# Patient Record
Sex: Female | Born: 1955 | Race: White | Hispanic: No | Smoking: Never smoker
Health system: Southern US, Community
[De-identification: ages and names within clinical notes are randomized; demographics above are authoritative.]

## PROBLEM LIST (undated history)

## (undated) DIAGNOSIS — E785 Hyperlipidemia, unspecified: Secondary | ICD-10-CM

## (undated) DIAGNOSIS — I10 Essential (primary) hypertension: Secondary | ICD-10-CM

## (undated) DIAGNOSIS — K829 Disease of gallbladder, unspecified: Secondary | ICD-10-CM

## (undated) DIAGNOSIS — F32A Depression, unspecified: Secondary | ICD-10-CM

## (undated) DIAGNOSIS — M549 Dorsalgia, unspecified: Secondary | ICD-10-CM

## (undated) DIAGNOSIS — G43909 Migraine, unspecified, not intractable, without status migrainosus: Secondary | ICD-10-CM

## (undated) DIAGNOSIS — F329 Major depressive disorder, single episode, unspecified: Secondary | ICD-10-CM

## (undated) DIAGNOSIS — M4800 Spinal stenosis, site unspecified: Secondary | ICD-10-CM

## (undated) DIAGNOSIS — C50919 Malignant neoplasm of unspecified site of unspecified female breast: Secondary | ICD-10-CM

## (undated) DIAGNOSIS — E039 Hypothyroidism, unspecified: Secondary | ICD-10-CM

## (undated) DIAGNOSIS — R112 Nausea with vomiting, unspecified: Secondary | ICD-10-CM

## (undated) DIAGNOSIS — F909 Attention-deficit hyperactivity disorder, unspecified type: Secondary | ICD-10-CM

## (undated) DIAGNOSIS — G629 Polyneuropathy, unspecified: Secondary | ICD-10-CM

## (undated) DIAGNOSIS — Z923 Personal history of irradiation: Secondary | ICD-10-CM

## (undated) DIAGNOSIS — K8020 Calculus of gallbladder without cholecystitis without obstruction: Secondary | ICD-10-CM

## (undated) DIAGNOSIS — E079 Disorder of thyroid, unspecified: Secondary | ICD-10-CM

## (undated) DIAGNOSIS — Z9889 Other specified postprocedural states: Secondary | ICD-10-CM

## (undated) DIAGNOSIS — M2550 Pain in unspecified joint: Secondary | ICD-10-CM

## (undated) DIAGNOSIS — F419 Anxiety disorder, unspecified: Secondary | ICD-10-CM

## (undated) DIAGNOSIS — Z9221 Personal history of antineoplastic chemotherapy: Secondary | ICD-10-CM

## (undated) HISTORY — DX: Major depressive disorder, single episode, unspecified: F32.9

## (undated) HISTORY — PX: DENTAL SURGERY: SHX609

## (undated) HISTORY — PX: REPLACEMENT TOTAL KNEE: SUR1224

## (undated) HISTORY — DX: Disorder of thyroid, unspecified: E07.9

## (undated) HISTORY — DX: Polyneuropathy, unspecified: G62.9

## (undated) HISTORY — DX: Disease of gallbladder, unspecified: K82.9

## (undated) HISTORY — PX: CYSTIC HYGROMA EXCISION: SHX450

## (undated) HISTORY — DX: Pain in unspecified joint: M25.50

## (undated) HISTORY — DX: Depression, unspecified: F32.A

## (undated) HISTORY — DX: Dorsalgia, unspecified: M54.9

## (undated) HISTORY — DX: Essential (primary) hypertension: I10

## (undated) HISTORY — DX: Hyperlipidemia, unspecified: E78.5

## (undated) HISTORY — DX: Attention-deficit hyperactivity disorder, unspecified type: F90.9

## (undated) HISTORY — PX: COLONOSCOPY: SHX174

## (undated) HISTORY — DX: Spinal stenosis, site unspecified: M48.00

## (undated) HISTORY — DX: Calculus of gallbladder without cholecystitis without obstruction: K80.20

---
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
## 1998-10-12 ENCOUNTER — Ambulatory Visit (HOSPITAL_COMMUNITY): Admission: RE | Admit: 1998-10-12 | Discharge: 1998-10-12 | Payer: Self-pay | Admitting: Obstetrics & Gynecology

## 1998-10-16 ENCOUNTER — Other Ambulatory Visit: Admission: RE | Admit: 1998-10-16 | Discharge: 1998-10-16 | Payer: Self-pay | Admitting: Obstetrics & Gynecology

## 1998-10-16 ENCOUNTER — Encounter: Admission: RE | Admit: 1998-10-16 | Discharge: 1999-01-14 | Payer: Self-pay | Admitting: Obstetrics & Gynecology

## 1999-06-29 ENCOUNTER — Ambulatory Visit (HOSPITAL_COMMUNITY): Admission: RE | Admit: 1999-06-29 | Discharge: 1999-06-29 | Payer: Self-pay | Admitting: Obstetrics & Gynecology

## 1999-06-29 ENCOUNTER — Encounter: Payer: Self-pay | Admitting: Obstetrics & Gynecology

## 1999-11-07 ENCOUNTER — Other Ambulatory Visit: Admission: RE | Admit: 1999-11-07 | Discharge: 1999-11-07 | Payer: Self-pay | Admitting: Obstetrics & Gynecology

## 2001-01-22 ENCOUNTER — Encounter: Admission: RE | Admit: 2001-01-22 | Discharge: 2001-01-22 | Payer: Self-pay | Admitting: Obstetrics & Gynecology

## 2001-01-22 ENCOUNTER — Other Ambulatory Visit: Admission: RE | Admit: 2001-01-22 | Discharge: 2001-01-22 | Payer: Self-pay | Admitting: Obstetrics & Gynecology

## 2001-01-22 ENCOUNTER — Encounter: Payer: Self-pay | Admitting: Obstetrics & Gynecology

## 2001-10-07 HISTORY — PX: ABDOMINAL HYSTERECTOMY: SHX81

## 2002-01-25 ENCOUNTER — Other Ambulatory Visit: Admission: RE | Admit: 2002-01-25 | Discharge: 2002-01-25 | Payer: Self-pay | Admitting: Obstetrics and Gynecology

## 2002-01-25 ENCOUNTER — Encounter: Payer: Self-pay | Admitting: Obstetrics & Gynecology

## 2002-01-25 ENCOUNTER — Ambulatory Visit (HOSPITAL_COMMUNITY): Admission: RE | Admit: 2002-01-25 | Discharge: 2002-01-25 | Payer: Self-pay | Admitting: Obstetrics & Gynecology

## 2002-04-13 ENCOUNTER — Encounter (INDEPENDENT_AMBULATORY_CARE_PROVIDER_SITE_OTHER): Payer: Self-pay

## 2002-04-14 ENCOUNTER — Inpatient Hospital Stay (HOSPITAL_COMMUNITY): Admission: RE | Admit: 2002-04-14 | Discharge: 2002-04-15 | Payer: Self-pay | Admitting: Obstetrics & Gynecology

## 2003-08-31 ENCOUNTER — Encounter: Admission: RE | Admit: 2003-08-31 | Discharge: 2003-08-31 | Payer: Self-pay | Admitting: Obstetrics & Gynecology

## 2004-01-18 ENCOUNTER — Other Ambulatory Visit: Admission: RE | Admit: 2004-01-18 | Discharge: 2004-01-18 | Payer: Self-pay | Admitting: Obstetrics & Gynecology

## 2004-08-10 ENCOUNTER — Other Ambulatory Visit: Admission: RE | Admit: 2004-08-10 | Discharge: 2004-08-10 | Payer: Self-pay | Admitting: Family Medicine

## 2004-09-23 LAB — HM COLONOSCOPY: HM Colonoscopy: NEGATIVE

## 2005-02-04 HISTORY — PX: CATARACT EXTRACTION: SUR2

## 2005-03-05 ENCOUNTER — Other Ambulatory Visit: Admission: RE | Admit: 2005-03-05 | Discharge: 2005-03-05 | Payer: Self-pay | Admitting: Obstetrics & Gynecology

## 2006-02-25 ENCOUNTER — Encounter: Admission: RE | Admit: 2006-02-25 | Discharge: 2006-02-25 | Payer: Self-pay | Admitting: Obstetrics & Gynecology

## 2008-10-18 ENCOUNTER — Encounter: Payer: Self-pay | Admitting: Family Medicine

## 2009-04-03 ENCOUNTER — Ambulatory Visit: Payer: Self-pay | Admitting: Family Medicine

## 2009-04-03 DIAGNOSIS — F339 Major depressive disorder, recurrent, unspecified: Secondary | ICD-10-CM | POA: Insufficient documentation

## 2009-04-03 DIAGNOSIS — I10 Essential (primary) hypertension: Secondary | ICD-10-CM | POA: Insufficient documentation

## 2009-04-03 DIAGNOSIS — E039 Hypothyroidism, unspecified: Secondary | ICD-10-CM | POA: Insufficient documentation

## 2009-04-03 LAB — CONVERTED CEMR LAB
Glucose, Urine, Semiquant: NEGATIVE
Nitrite: NEGATIVE
Specific Gravity, Urine: 1.025
WBC Urine, dipstick: NEGATIVE

## 2009-04-04 LAB — CONVERTED CEMR LAB
ALT: 16 units/L (ref 0–35)
BUN: 19 mg/dL (ref 6–23)
Basophils Relative: 0 % (ref 0.0–3.0)
Bilirubin, Direct: 0.1 mg/dL (ref 0.0–0.3)
CO2: 31 meq/L (ref 19–32)
Chloride: 106 meq/L (ref 96–112)
Cholesterol: 244 mg/dL — ABNORMAL HIGH (ref 0–200)
Creatinine, Ser: 0.8 mg/dL (ref 0.4–1.2)
Direct LDL: 170.5 mg/dL
Eosinophils Absolute: 0.1 10*3/uL (ref 0.0–0.7)
Eosinophils Relative: 2.1 % (ref 0.0–5.0)
HCT: 39.8 % (ref 36.0–46.0)
Lymphs Abs: 1.4 10*3/uL (ref 0.7–4.0)
MCHC: 34.5 g/dL (ref 30.0–36.0)
MCV: 87.9 fL (ref 78.0–100.0)
Monocytes Absolute: 0.3 10*3/uL (ref 0.1–1.0)
Neutrophils Relative %: 56.6 % (ref 43.0–77.0)
Platelets: 207 10*3/uL (ref 150.0–400.0)
Potassium: 3.8 meq/L (ref 3.5–5.1)
TSH: 3.64 microintl units/mL (ref 0.35–5.50)
Total Protein: 6.8 g/dL (ref 6.0–8.3)

## 2009-04-14 ENCOUNTER — Ambulatory Visit: Payer: Self-pay | Admitting: Family Medicine

## 2009-04-14 DIAGNOSIS — L301 Dyshidrosis [pompholyx]: Secondary | ICD-10-CM | POA: Insufficient documentation

## 2009-04-14 DIAGNOSIS — F988 Other specified behavioral and emotional disorders with onset usually occurring in childhood and adolescence: Secondary | ICD-10-CM | POA: Insufficient documentation

## 2009-05-23 ENCOUNTER — Telehealth: Payer: Self-pay | Admitting: Family Medicine

## 2009-05-26 ENCOUNTER — Telehealth: Payer: Self-pay | Admitting: Family Medicine

## 2009-07-14 ENCOUNTER — Telehealth: Payer: Self-pay | Admitting: Family Medicine

## 2009-07-18 ENCOUNTER — Telehealth: Payer: Self-pay | Admitting: Family Medicine

## 2009-07-24 ENCOUNTER — Telehealth: Payer: Self-pay | Admitting: Family Medicine

## 2009-08-22 ENCOUNTER — Telehealth: Payer: Self-pay | Admitting: Family Medicine

## 2009-09-25 ENCOUNTER — Telehealth: Payer: Self-pay | Admitting: Family Medicine

## 2009-10-16 ENCOUNTER — Ambulatory Visit: Payer: Self-pay | Admitting: Family Medicine

## 2009-10-16 DIAGNOSIS — J019 Acute sinusitis, unspecified: Secondary | ICD-10-CM | POA: Insufficient documentation

## 2009-10-31 ENCOUNTER — Telehealth: Payer: Self-pay | Admitting: *Deleted

## 2010-01-08 ENCOUNTER — Ambulatory Visit: Payer: Self-pay | Admitting: Family Medicine

## 2010-01-08 DIAGNOSIS — E785 Hyperlipidemia, unspecified: Secondary | ICD-10-CM

## 2010-01-08 DIAGNOSIS — E782 Mixed hyperlipidemia: Secondary | ICD-10-CM | POA: Insufficient documentation

## 2010-01-08 LAB — CONVERTED CEMR LAB: Cholesterol, target level: 200 mg/dL

## 2010-01-17 LAB — CONVERTED CEMR LAB
CO2: 30 meq/L (ref 19–32)
Chloride: 101 meq/L (ref 96–112)
Cholesterol: 234 mg/dL — ABNORMAL HIGH (ref 0–200)
Direct LDL: 159.9 mg/dL
Potassium: 4.1 meq/L (ref 3.5–5.1)
Sodium: 143 meq/L (ref 135–145)
TSH: 8.59 microintl units/mL — ABNORMAL HIGH (ref 0.35–5.50)
Total CHOL/HDL Ratio: 3
VLDL: 25.8 mg/dL (ref 0.0–40.0)

## 2010-04-12 ENCOUNTER — Telehealth: Payer: Self-pay | Admitting: Family Medicine

## 2010-04-12 ENCOUNTER — Ambulatory Visit: Payer: Self-pay | Admitting: Family Medicine

## 2010-04-12 DIAGNOSIS — R5381 Other malaise: Secondary | ICD-10-CM | POA: Insufficient documentation

## 2010-04-12 DIAGNOSIS — R5383 Other fatigue: Secondary | ICD-10-CM

## 2010-04-13 LAB — CONVERTED CEMR LAB: TSH: 4.23 microintl units/mL (ref 0.35–5.50)

## 2010-06-04 ENCOUNTER — Telehealth: Payer: Self-pay | Admitting: Family Medicine

## 2010-06-05 ENCOUNTER — Telehealth: Payer: Self-pay | Admitting: Family Medicine

## 2010-08-17 ENCOUNTER — Telehealth: Payer: Self-pay | Admitting: Family Medicine

## 2010-09-27 ENCOUNTER — Ambulatory Visit: Payer: Self-pay | Admitting: Family Medicine

## 2010-10-26 ENCOUNTER — Ambulatory Visit
Admission: RE | Admit: 2010-10-26 | Discharge: 2010-10-26 | Payer: Self-pay | Source: Home / Self Care | Attending: Family Medicine | Admitting: Family Medicine

## 2010-10-26 DIAGNOSIS — R635 Abnormal weight gain: Secondary | ICD-10-CM | POA: Insufficient documentation

## 2010-10-28 ENCOUNTER — Encounter: Payer: Self-pay | Admitting: Obstetrics & Gynecology

## 2010-10-29 ENCOUNTER — Encounter: Payer: Self-pay | Admitting: Obstetrics & Gynecology

## 2010-10-30 ENCOUNTER — Encounter
Admission: RE | Admit: 2010-10-30 | Discharge: 2010-10-30 | Payer: Self-pay | Source: Home / Self Care | Attending: Obstetrics & Gynecology | Admitting: Obstetrics & Gynecology

## 2010-11-08 NOTE — Assessment & Plan Note (Signed)
Summary: consult re: antidepressants/cjr   Vital Signs:  Patient profile:   55 year old female Weight:      196 pounds Temp:     98.2 degrees F oral BP sitting:   130 / 90  (left arm) Cuff size:   large  Vitals Entered By: Sid Falcon LPN (September 27, 2010 10:16 AM)  History of Present Illness: Long hx of depression.  Recent increased anhedonia.   Low motivation.  Less interest in hobbies.  Sleep OK. Appetite  OK.   Compliant with Effexor therapy. No suicidal ideation.  Has questions regarding additional meds vs change of medication.  Does not feel her depression is in remission.  Allergies: 1)  Morphine Sulfate (Morphine Sulfate)  Past History:  Past Medical History: Last updated: 04/03/2009 Depression Hypothyroidism ADD Hypertension PMH reviewed for relevance  Physical Exam  General:  Well-developed,well-nourished,in no acute distress; alert,appropriate and cooperative throughout examination Mouth:  Oral mucosa and oropharynx without lesions or exudates.  Teeth in good repair. Neck:  No deformities, masses, or tenderness noted. Lungs:  Normal respiratory effort, chest expands symmetrically. Lungs are clear to auscultation, no crackles or wheezes. Heart:  Normal rate and regular rhythm. S1 and S2 normal without gallop, murmur, click, rub or other extra sounds. Psych:  good eye contact, not anxious appearing, and depressed affect.     Impression & Recommendations:  Problem # 1:  DEPRESSION (ICD-311) Assessment Deteriorated Add Abilify 2 mg by mouth at bedtime.  Reassess in one month.  Consider change to Wellbutrin at follow up if still low energy, etc. Recent TSH normal. Her updated medication list for this problem includes:    Effexor Xr 75 Mg Xr24h-cap (Venlafaxine hcl) ..... One by mouth daily  Complete Medication List: 1)  Hydrochlorothiazide 25 Mg Tabs (Hydrochlorothiazide) .... Once daily 2)  Estradiol 1 Mg Tabs (Estradiol) .... Once daily 3)   Amphetamine-dextroamphetamine 30 Mg Xr24h-cap (Amphetamine-dextroamphetamine) .... One by mouth once daily 4)  Betamethasone Dipropionate 0.05 % Crea (Betamethasone dipropionate) .... Apply to affected rash two times a day no longer than 2 weeks of continuous use 5)  Sumatriptan Succinate 100 Mg Tabs (Sumatriptan succinate) .... Take one tab by mouth as needed, may repeat once in 24 hours 6)  Fluticasone Propionate 50 Mcg/act Susp (Fluticasone propionate) .... Use 2 sprays in each nostril daily 7)  Effexor Xr 75 Mg Xr24h-cap (Venlafaxine hcl) .... One by mouth daily 8)  Amphetamine-dextroamphetamine 30 Mg Xr24h-cap (Amphetamine-dextroamphetamine) .... One tab by mouth daily may fill in one month 9)  Amphetamine-dextroamphetamine 30 Mg Xr24h-cap (Amphetamine-dextroamphetamine) .... One tab daily may fill in two months 10)  Amoxicillin-pot Clavulanate 875-125 Mg Tabs (Amoxicillin-pot clavulanate) .... One by mouth two times a day for 10 days 11)  Levothroid 50 Mcg Tabs (Levothyroxine sodium) .... Take one tab by mouth once daily 12)  Abilify 2 Mg Tabs (Aripiprazole) .... One by mouth at bedtime  Patient Instructions: 1)  Please schedule a follow-up appointment in 1 month.  Prescriptions: ABILIFY 2 MG TABS (ARIPIPRAZOLE) one by mouth at bedtime  #30 x 1   Entered and Authorized by:   [name] MD   Signed by:   [name] MD on 09/27/2010   Method used:   Electronically to        [facility]  [phone]* (retail)       [address] [place], [place]  [address]       Ph: [phone] or [phone]  Fax: [phone]   RxID:   [id number]    Orders Added: 1)  Est. Patient Level III [87564]

## 2010-11-08 NOTE — Progress Notes (Signed)
Summary: Pt req refil of Adderall. Pls call when ready for pick up  Phone Note Refill Request Call back at Home Phone [phone] Message from:  Patient on August 17, 2010 1:37 PM  Refills Requested: Medication #1:  AMPHETAMINE-DEXTROAMPHETAMINE 30 MG XR24H-CAP one by mouth once daily   Dosage confirmed as above?Dosage Confirmed  Method Requested: Pick up at Office Initial call taken by: Lucy Antigua,  August 17, 2010 1:36 PM  Follow-up for Phone Call        Last 3 Rx filled 04/13/2010 Sid Falcon LPN  August 17, 2010 4:18 PM will refill Follow-up by: [name] MD,  August 20, 2010 8:23 AM    Prescriptions: AMPHETAMINE-DEXTROAMPHETAMINE 30 MG XR24H-CAP (AMPHETAMINE-DEXTROAMPHETAMINE) one tab daily may fill in two months  #30 x 0   Entered and Authorized by:   [name] MD   Signed by:   [name] MD on 08/20/2010   Method used:   Print then Give to Patient   RxID:   [id number] AMPHETAMINE-DEXTROAMPHETAMINE 30 MG XR24H-CAP (AMPHETAMINE-DEXTROAMPHETAMINE) one tab by mouth daily May fill in one month  #30 x 0   Entered and Authorized by:   [name] MD   Signed by:   [name] MD on 08/20/2010   Method used:   Print then Give to Patient   RxID:   [id number] AMPHETAMINE-DEXTROAMPHETAMINE 30 MG XR24H-CAP (AMPHETAMINE-DEXTROAMPHETAMINE) one by mouth once daily  #30 x 0   Entered and Authorized by:   [name] MD   Signed by:   [name] MD on 08/20/2010   Method used:   Print then Give to Patient   RxID:   [id number]

## 2010-11-08 NOTE — Assessment & Plan Note (Signed)
Summary: f/u on meds/cdw   Vital Signs:  Patient profile:   55 year old female Weight:      202 pounds Temp:     98.0 degrees F oral  Vitals Entered By: Sid Falcon LPN (October 26, 2010 11:31 AM) CC: discuss meds   History of Present Illness: Here to discuss the following:  Long hx of recurrent depression.  Has been on Effexor and recent addition of  Abilify.  Still feels depressed frequently and has low motivation and low energy. TSH normal last summer.  Compliant with meds. Would like to consider change of  antidepressant meds.  No suicidal ideation.  Some gradual mild weight gain.  No regular exercise.  Increased stress with work and  being single.  ADD stable on generic Adderall.  No recent headaches or BP problems. Hypothyroid and compliant with meds.  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  The patient denies anorexia, fever, hoarseness, chest pain, syncope, dyspnea on exertion, peripheral edema, prolonged cough, headaches, hemoptysis, abdominal pain, melena, hematochezia, severe indigestion/heartburn, and muscle weakness.    Physical Exam  General:  Well-developed,well-nourished,in no acute distress; alert,appropriate and cooperative  throughout examination Head:  Normocephalic and atraumatic without obvious abnormalities. No apparent alopecia or balding. Mouth:  Oral mucosa and oropharynx without lesions or exudates.  Teeth in good repair. Neck:  No deformities, masses, or tenderness noted. Lungs:  Normal respiratory effort, chest expands symmetrically. Lungs are clear to auscultation, no crackles or wheezes. Heart:  Normal rate and regular rhythm. S1 and S2 normal without gallop, murmur, click, rub or other extra sounds. Extremities:  No clubbing, cyanosis, edema, or deformity noted with normal full range of motion of all joints.     Impression & Recommendations:  Problem # 1:  DEPRESSION (ICD-311) discussed options. Considering several factors-low energy, low motivation, ADD hx, will switch to Wellburin after stopping Effexor.  Cont low dose Abilify for now.   Her updated medication list for this problem includes:    Wellbutrin Xl 300 Mg Xr24h-tab (Bupropion hcl) ..... One by mouth once daily  Problem # 2:  ADD (ICD-314.00)  Problem # 3:  HYPOTHYROIDISM (ICD-244.9)  Her updated medication list for this problem includes:    Levothroid 50 Mcg Tabs (Levothyroxine sodium) .Marland Kitchen... Take one tab by mouth once daily  Problem # 4:  WEIGHT GAIN (ICD-783.1) discussed weight loss strategies.  Complete Medication List: 1)  Hydrochlorothiazide 25 Mg Tabs (Hydrochlorothiazide) .... Once daily 2)  Estradiol 1 Mg Tabs (Estradiol) .... Once daily 3)  Amphetamine-dextroamphetamine 30 Mg Xr24h-cap (Amphetamine-dextroamphetamine) .... One by mouth once daily 4)  Sumatriptan Succinate 100 Mg Tabs (Sumatriptan succinate) .... Take one tab by mouth as needed, may repeat once in 24 hours 5)  Fluticasone Propionate 50 Mcg/act Susp (Fluticasone propionate) .... Use 2 sprays in each nostril daily 6)  Wellbutrin Xl 300 Mg Xr24h-tab (Bupropion hcl) .... One by mouth  once daily 7)  Amphetamine-dextroamphetamine 30 Mg Xr24h-cap  (Amphetamine-dextroamphetamine) .... One tab by mouth daily may fill in one month 8)  Amphetamine-dextroamphetamine 30 Mg Xr24h-cap (Amphetamine-dextroamphetamine) .... One tab daily may fill in two months 9)  Levothroid 50 Mcg Tabs (Levothyroxine sodium) .... Take one tab by mouth once daily 10)  Abilify 2 Mg Tabs (Aripiprazole) .... One by mouth at bedtime  Patient Instructions: 1)  Continue Abilify 2)  Reduce Effexor to every other day for one week then discontinue 3)  Start Wellbutrin after discontinuation of Effexor 4)  Please schedule a follow-up appointment in 1 month.  Prescriptions: WELLBUTRIN XL 300 MG XR24H-TAB (BUPROPION HCL) one by mouth once daily  #30 x 3   Entered and Authorized by:   [name] MD   Signed by:   [name] MD on 10/26/2010   Method used:   Print then Give to Patient   RxID:   [id number]    Orders Added: 1)  Est. Patient Level IV [14782]    Prevention & Chronic Care Immunizations   Influenza vaccine: Not documented    Tetanus booster: 04/14/2009: Tdap    Pneumococcal vaccine: Not documented  Colorectal Screening   Hemoccult: Not documented    Colonoscopy: normal  (12/06/2003)  Other Screening   Pap smear: Not documented    Mammogram: normal  (10/08/2007)   Smoking status: never  (10/26/2010)  Lipids   Total Cholesterol: 234  (01/08/2010)   LDL: Not documented   LDL Direct: 159.9  (01/08/2010)   HDL: 69.30  (01/08/2010)   Triglycerides: 129.0  (01/08/2010)    SGOT (AST): 18  (04/03/2009)   SGPT (ALT): 16  (04/03/2009)   Alkaline phosphatase: 52  (04/03/2009)   Total bilirubin: 0.6  (04/03/2009)  Hypertension   Last Blood Pressure: 130 / 90  (09/27/2010)   Serum creatinine: 0.8  (01/08/2010)   Serum potassium 4.1  (01/08/2010)  Self-Management Support :    Hypertension self-management support: Not documented    Lipid self-management support: Not documented

## 2010-11-08 NOTE — Progress Notes (Signed)
Summary: Thyroid written Rx for [facility]  Phone Note Call from Patient Call back at Home Phone [phone]   Caller: Patient Call For: [name] MD Summary of Call: Pt requesting a mail order Rx for [facility], she wants to pick up and mail in. Initial call taken by: Sid Falcon LPN,  June 04, 2010 4:14 PM    Prescriptions: LEVOTHROID 25 MCG TABS (LEVOTHYROXINE SODIUM) once daily  #90 x 3   Entered by:   Sid Falcon LPN   Authorized by:   [name] MD   Signed by:   Sid Falcon LPN on 09/81/1914   Method used:   Print then Give to Patient   RxID:   [id number]

## 2010-11-08 NOTE — Progress Notes (Signed)
Summary: rx change  Phone Note Call from Patient   Summary of Call: last rx was incorrect.  synthroid 50 micrograms. Initial call taken by: Kern Reap CMA (AAMA),  June 05, 2010 1:40 PM    New/Updated Medications: LEVOTHROID 50 MCG TABS (LEVOTHYROXINE SODIUM) take one tab by mouth once daily Prescriptions: LEVOTHROID 50 MCG TABS (LEVOTHYROXINE SODIUM) take one tab by mouth once daily  #90 x 3   Entered by:   Kern Reap CMA (AAMA)   Authorized by:   [name] MD   Signed by:   Kern Reap CMA (AAMA) on 06/05/2010   Method used:   Print then Give to Patient   RxID:   [id number]

## 2010-11-08 NOTE — Progress Notes (Signed)
Summary: new rx Adderall  Phone Note Call from Patient Call back at Home Phone [phone]   Caller: Patient Call For: Tiffany Peat MD Summary of Call: pt just left office she forget new rx generic adderall 30 mg Initial call taken by: Heron Sabins,  April 12, 2010 9:35 AM  Follow-up for Phone Call        Dr Caryl Never will fill using todays OV note.  Pt informed on home VM RX will be ready tomorrow.  Last filled 01/08/2010 Follow-up by: Sid Falcon LPN,  April 13, 980 9:05 AM  Additional Follow-up for Phone Call Additional follow up Details #1::        will refill Additional Follow-up by: Tiffany Peat MD,  April 13, 2010 9:36 AM    Additional Follow-up for Phone Call Additional follow up Details #2::    Pt informed RX ready for pick-up on home VM Follow-up by: Sid Falcon LPN,  April 13, 1913 10:12 AM  Prescriptions: AMPHETAMINE-DEXTROAMPHETAMINE 30 MG XR24H-CAP (AMPHETAMINE-DEXTROAMPHETAMINE) one tab daily may fill in two months  #30 x 0   Entered and Authorized by:   Tiffany Peat MD   Signed by:   Tiffany Peat MD on 04/13/2010   Method used:   Print then Give to Patient   RxID:   [id number] AMPHETAMINE-DEXTROAMPHETAMINE 30 MG XR24H-CAP (AMPHETAMINE-DEXTROAMPHETAMINE) one tab by mouth daily May fill in one month  #30 x 0   Entered and Authorized by:   Tiffany Peat MD   Signed by:   Tiffany Peat MD on 04/13/2010   Method used:   Print then Give to Patient   RxID:   [id number] AMPHETAMINE-DEXTROAMPHETAMINE 30 MG XR24H-CAP (AMPHETAMINE-DEXTROAMPHETAMINE) one by mouth once daily  #30 x 0   Entered and Authorized by:   Tiffany Peat MD   Signed by:   Tiffany Peat MD on 04/13/2010   Method used:   Print then Give to Patient   RxID:   [id number]

## 2010-11-08 NOTE — Assessment & Plan Note (Signed)
Summary: med check/refills/bp check/pt fasting/cjr   Vital Signs:  Patient profile:   55 year old female Height:      63.25 inches Weight:      194 pounds BMI:     34.22 Temp:     98.8 degrees F oral BP sitting:   130 / 88  (left arm) Cuff size:   large  Vitals Entered By: Sid Falcon LPN (January 08, 1609 8:58 AM)  Nutrition Counseling: Patient's BMI is greater than 25 and therefore counseled on weight management options.  Serial Vital Signs/Assessments:  Time      Position  BP       Pulse  Resp  Temp     By                     122/80                         [name] MD   History of Present Illness: Patient here for followup multiple medical problems including hypertension, hypothyroidism, hyperlipidemia, and ADD.  Patient compliant with all medications. Denies side effects. Not exercising. Mild weight gain since last visit. Symptoms of some urine urgency but no thirst. Positive family history of diabetes.  Hypertension History:      She denies headache, chest pain, palpitations, dyspnea with exertion, orthopnea, PND, peripheral edema, neurologic problems, syncope, and side effects from treatment.  She notes no problems with any antihypertensive medication side effects.        Positive major cardiovascular risk factors include hyperlipidemia and hypertension.  Negative major cardiovascular risk factors include female age less than 59 years old, no history of diabetes, negative family history for ischemic heart disease, and non-tobacco-user status.        Further assessment for target organ damage reveals no history of ASHD, stroke/TIA, or peripheral vascular disease.    Lipid Management History:      Positive NCEP/ATP III risk factors include hypertension.  Negative NCEP/ATP III risk factors include female age less than 1 years old, non-diabetic, no family history for ischemic heart disease, non-tobacco-user status, no ASHD (atherosclerotic heart disease), no prior  stroke/TIA, no peripheral vascular disease, and no history of aortic aneurysm.      Allergies: 1)  Morphine Sulfate (Morphine Sulfate)  Past History:  Past Medical History: Last updated: 04/03/2009 Depression Hypothyroidism ADD Hypertension  Family History: Last updated: 04/03/2009 Family History of Alcoholism/Addiction, parent Family History Lung cancer, parent Family history stroke, grandparent Family history diabetes, parent PMH reviewed for relevance, FH reviewed for relevance  Review of Systems  The patient denies anorexia, fever, weight loss, chest pain, syncope, dyspnea on exertion, peripheral edema, prolonged cough, headaches, hemoptysis, abdominal pain, melena, hematochezia, severe indigestion/heartburn, hematuria, incontinence, muscle weakness, suspicious skin lesions, and depression.    Physical Exam  General:  Well-developed,well-nourished,in no acute distress; alert,appropriate and cooperative throughout examination Head:  Normocephalic and atraumatic without obvious abnormalities. No apparent alopecia or balding. Mouth:  Oral mucosa and oropharynx without lesions or exudates.  Teeth in good repair. Neck:  No deformities, masses, or tenderness noted. Lungs:  Normal respiratory effort, chest expands symmetrically. Lungs are clear to auscultation, no crackles or wheezes. Heart:  Normal rate and regular rhythm. S1 and S2 normal without gallop, murmur, click, rub or other extra sounds. Extremities:  mild varicosities otherwise normal. No pitting edema. Good distal pulses. Neurologic:  alert & oriented X3, cranial nerves II-XII intact, and strength normal  in all extremities.   Skin:  Intact without suspicious lesions or rashes Cervical Nodes:  No lymphadenopathy noted Psych:  normally interactive, good eye contact, not anxious appearing, and not depressed appearing.     Impression & Recommendations:  Problem # 1:  ADD (ICD-314.00) refill meds  Problem # 2:   HYPERTENSION (ICD-401.9)  Her updated medication list for this problem includes:    Hydrochlorothiazide 25 Mg Tabs (Hydrochlorothiazide) ..... Once daily  Orders: Venipuncture (65784) TLB-BMP (Basic Metabolic Panel-BMET) (80048-METABOL)  Problem # 3:  HYPOTHYROIDISM (ICD-244.9)  Her updated medication list for this problem includes:    Levothroid 25 Mcg Tabs (Levothyroxine sodium) ..... Once daily  Orders: Venipuncture (69629) TLB-TSH (Thyroid Stimulating Hormone) (84443-TSH)  Problem # 4:  HYPERLIPIDEMIA (ICD-272.4) recheck fasting lipids today. Orders: TLB-Lipid Panel (80061-LIPID)  Problem # 5:  DEPRESSION (ICD-311) Assessment: Unchanged  Her updated medication list for this problem includes:    Effexor Xr 75 Mg Xr24h-cap (Venlafaxine hcl) ..... One by mouth daily  Complete Medication List: 1)  Hydrochlorothiazide 25 Mg Tabs (Hydrochlorothiazide) .... Once daily 2)  Levothroid 25 Mcg Tabs (Levothyroxine sodium) .... Once daily 3)  Estradiol 1 Mg Tabs (Estradiol) .... Once daily 4)  Amphetamine-dextroamphetamine 30 Mg Xr24h-cap (Amphetamine-dextroamphetamine) .... One by mouth once daily 5)  Betamethasone Dipropionate 0.05 % Crea (Betamethasone dipropionate) .... Apply to affected rash two times a day no longer than 2 weeks of continuous use 6)  Sumatriptan Succinate 100 Mg Tabs (Sumatriptan succinate) .... Take one tab by mouth as needed, may repeat once in 24 hours 7)  Fluticasone Propionate 50 Mcg/act Susp (Fluticasone propionate) .... Use 2 sprays in each nostril daily 8)  Effexor Xr 75 Mg Xr24h-cap (Venlafaxine hcl) .... One by mouth daily 9)  Amphetamine-dextroamphetamine 30 Mg Xr24h-cap (Amphetamine-dextroamphetamine) .... One tab by mouth daily may fill in one month 10)  Amphetamine-dextroamphetamine 30 Mg Xr24h-cap (Amphetamine-dextroamphetamine) .... One tab daily may fill in two months 11)  Amoxicillin-pot Clavulanate 875-125 Mg Tabs (Amoxicillin-pot clavulanate)  .... One by mouth two times a day for 10 days  Hypertension Assessment/Plan:      The patient's hypertensive risk group is category B: At least one risk factor (excluding diabetes) with no target organ damage.  Today's blood pressure is 130/88.    Lipid Assessment/Plan:      Based on NCEP/ATP III, the patient's risk factor category is "0-1 risk factors".  The patient's lipid goals are as follows: Total cholesterol goal is 200; LDL cholesterol goal is 160; HDL cholesterol goal is 40; Triglyceride goal is 150.    Patient Instructions: 1)  Consider scheduling complete physical examination in 3-4 months 2)  It is important that you exercise reguarly at least 20 minutes 5 times a week. If you develop chest pain, have severe difficulty breathing, or feel very tired, stop exercising immediately and seek medical attention.  3)  You need to lose weight. Consider a lower calorie diet and regular exercise.  Prescriptions: AMPHETAMINE-DEXTROAMPHETAMINE 30 MG XR24H-CAP (AMPHETAMINE-DEXTROAMPHETAMINE) one tab daily may fill in two months  #30 x 0   Entered and Authorized by:   [name] MD   Signed by:   [name] MD on 01/08/2010   Method used:   Print then Give to Patient   RxID:   [id number] AMPHETAMINE-DEXTROAMPHETAMINE 30 MG XR24H-CAP (AMPHETAMINE-DEXTROAMPHETAMINE) one tab by mouth daily May fill in one month  #30 x 0   Entered and Authorized by:   [name] MD   Signed by:  [name] MD on 01/08/2010   Method used:   Print then Give to Patient   RxID:   [id number] AMPHETAMINE-DEXTROAMPHETAMINE 30 MG XR24H-CAP (AMPHETAMINE-DEXTROAMPHETAMINE) one by mouth once daily  #30 x 0   Entered and Authorized by:   [name] MD   Signed by:   [name] MD on 01/08/2010   Method used:   Print then Give to Patient   RxID:   [id number] EFFEXOR XR 75 MG XR24H-CAP (VENLAFAXINE HCL) One by mouth daily  #90 x 3   Entered and Authorized by:   [name]  MD   Signed by:   [name] MD on 01/08/2010   Method used:   Electronically to        [facility]  [phone]* (retail)       [address] [place], [place]  [address]       Ph: [phone] or [phone]       Fax: [phone]   RxID:   [id number] LEVOTHROID 25 MCG TABS (LEVOTHYROXINE SODIUM) once daily  #90 Each x 3   Entered and Authorized by:   [name] MD   Signed by:   [name] MD on 01/08/2010   Method used:   Electronically to        [facility]  [phone]* (retail)       [address] [place], [place]  [address]       Ph: [phone] or [phone]       Fax: [phone]   RxID:   [id number] HYDROCHLOROTHIAZIDE 25 MG TABS (HYDROCHLOROTHIAZIDE) once daily  #90 x 3   Entered and Authorized by:   [name] MD   Signed by:   [name] MD on 01/08/2010   Method used:   Electronically to        [facility]  [phone]* (retail)       [address] [place], [place]  [address]       Ph: [phone] or [phone]       Fax: [phone]   RxID:   [id number]   Preventive Care Screening  Mammogram:    Date:  10/08/2007    Results:  normal   Colonoscopy:    Date:  12/06/2003    Results:  normal

## 2010-11-08 NOTE — Assessment & Plan Note (Signed)
Summary: 3 MTH ROV // RS   Vital Signs:  Patient profile:   55 year old female Weight:      198 pounds Temp:     98.7 degrees F oral BP sitting:   132 / 90  (left arm) Cuff size:   large  Vitals Entered By: Kathrynn Speed CMA (April 12, 2010 9:01 AM) CC: 3 mths fu haf double thyroid meds, she is fasting, still fatigued, src   History of Present Illness: Major issue is increased fatigue. recent thyroid under replaced and thyroid increased to 50 micrograms daily. No increase in energy since increasing.  Hx depression stable on meds. Does not exercise and has had steady weight gain.  Generally sleeping well.  No dyspnea or chest pain.  Current Medications (verified): 1)  Hydrochlorothiazide 25 Mg Tabs (Hydrochlorothiazide) .... Once Daily 2)  Levothroid 25 Mcg Tabs (Levothyroxine Sodium) .... Two Tabs Daily 3)  Estradiol 1 Mg Tabs (Estradiol) .... Once Daily 4)  Amphetamine-Dextroamphetamine 30 Mg Xr24h-Cap (Amphetamine-Dextroamphetamine) .... One By Mouth Once Daily 5)  Betamethasone Dipropionate 0.05 % Crea (Betamethasone Dipropionate) .... Apply To Affected Rash Two Times A Day No Longer Than 2 Weeks of Continuous Use 6)  Sumatriptan Succinate 100 Mg Tabs (Sumatriptan Succinate) .... Take One Tab By Mouth As Needed, May Repeat Once in 24 Hours 7)  Fluticasone Propionate 50 Mcg/act Susp (Fluticasone Propionate) .... Use 2 Sprays in Each Nostril Daily 8)  Effexor Xr 75 Mg Xr24h-Cap (Venlafaxine Hcl) .... One By Mouth Daily 9)  Amphetamine-Dextroamphetamine 30 Mg Xr24h-Cap (Amphetamine-Dextroamphetamine) .... One Tab By Mouth Daily May Fill in One Month 10)  Amphetamine-Dextroamphetamine 30 Mg Xr24h-Cap (Amphetamine-Dextroamphetamine) .... One Tab Daily May Fill in Two Months 11)  Amoxicillin-Pot Clavulanate 875-125 Mg Tabs (Amoxicillin-Pot Clavulanate) .... 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    Physical Exam  General:  Well-developed,well-nourished,in no acute distress; alert,appropriate and cooperative throughout examination Mouth:  Oral mucosa and oropharynx without lesions or exudates.  Teeth in good repair. Neck:  No deformities, masses, or tenderness noted. Lungs:  Normal respiratory effort, chest expands symmetrically. Lungs are clear to auscultation, no crackles or wheezes. Heart:  Normal rate and regular rhythm. S1 and S2 normal without gallop, murmur, click, rub or other extra sounds. Extremities:  no edema. Skin:  no rashes.   Psych:  normally interactive, good eye contact, not anxious appearing, and not depressed appearing.     Impression & Recommendations:  Problem # 1:  HYPOTHYROIDISM (ICD-244.9) recheck TSH. Her updated medication list for this problem includes:    Levothroid 25 Mcg Tabs (Levothyroxine sodium) .Marland Kitchen..Marland Kitchen Two tabs daily  Orders: Venipuncture (16109) TLB-TSH (Thyroid Stimulating Hormone) (84443-TSH)  Problem # 2:  FATIGUE (ICD-780.79) ?sec to #1.  Pt needs more exercise and to lose weight.  Complete Medication List: 1)  Hydrochlorothiazide 25 Mg Tabs (Hydrochlorothiazide) .... Once daily 2)  Levothroid 25 Mcg Tabs (Levothyroxine sodium) .... Two tabs daily 3)  Estradiol 1 Mg Tabs (Estradiol) .... Once daily 4)  Amphetamine-dextroamphetamine 30 Mg Xr24h-cap (Amphetamine-dextroamphetamine) .... One by mouth once daily 5)  Betamethasone Dipropionate 0.05 %  Crea (Betamethasone dipropionate) .... Apply to affected rash two times a day no longer than 2 weeks of  continuous use 6)  Sumatriptan Succinate 100 Mg Tabs (Sumatriptan succinate) .... Take one tab by mouth as needed, may repeat once in 24 hours 7)  Fluticasone Propionate 50 Mcg/act Susp (Fluticasone propionate) .... Use 2 sprays in each nostril daily 8)  Effexor Xr 75 Mg Xr24h-cap (Venlafaxine hcl) .... One by mouth daily 9)  Amphetamine-dextroamphetamine 30 Mg Xr24h-cap (Amphetamine-dextroamphetamine) .... One tab by mouth daily may fill in one month 10)  Amphetamine-dextroamphetamine 30 Mg Xr24h-cap (Amphetamine-dextroamphetamine) .... One tab daily may fill in two months 11)  Amoxicillin-pot Clavulanate 875-125 Mg Tabs (Amoxicillin-pot clavulanate) .... One by mouth two times a day for 10 days  Patient Instructions: 1)  It is important that you exercise reguarly at least 20 minutes 5 times a week. If you develop chest pain, have severe difficulty breathing, or feel very tired, stop exercising immediately and seek medical attention.  2)  You need to lose weight. Consider a lower calorie diet and regular exercise.

## 2010-11-08 NOTE — Assessment & Plan Note (Signed)
Summary: congestion-sinuses//ccm/pt rescd//ccm   Vital Signs:  Patient profile:   55 year old female Temp:     97.9 degrees F oral BP sitting:   138 / 90  (left arm) Cuff size:   large  Vitals Entered By: Sid Falcon LPN (October 16, 2009 8:36 AM) CC: Sinus congestion, headache pain X 20 days   History of Present Illness: Acute visit. Onset of sinus pressure and congestion since 21 December. Now has some green to brown nasal discharge. Intermittent headache. Right maxillary facial pain. Intermittent teeth pain. No sore throat. Has taken over-the-counter medications without improvement.  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  Otoscopic examination reveals clear canals, tympanic membranes are intact bilaterally without bulging, retraction, inflammation or discharge. Hearing is grossly normal bilaterally. Nose:  erythematous nasal mucosa. No purulent secretions noted Mouth:  Oral mucosa and oropharynx without lesions or exudates.  Teeth in good repair. Neck:  No deformities, masses, or tenderness noted. Lungs:  Normal respiratory effort, chest expands symmetrically. Lungs are clear to auscultation, no crackles or wheezes. Heart:  Normal rate and regular rhythm. S1 and S2 normal without gallop, murmur, click, rub or other extra sounds.   Impression & Recommendations:  Problem # 1:  SINUSITIS, ACUTE (ICD-461.9)  Augmentin. Her updated medication list for this problem includes:    Fluticasone Propionate 50  Mcg/act Susp (Fluticasone propionate) ..... Use 2 sprays in each nostril daily    Amoxicillin-pot Clavulanate 875-125 Mg Tabs (Amoxicillin-pot clavulanate) ..... One by mouth two times a day for 10 days  Complete Medication List: 1)  Hydrochlorothiazide 25 Mg Tabs (Hydrochlorothiazide) .... Once daily 2)  Levothroid 25 Mcg Tabs (Levothyroxine sodium) .... Once daily 3)  Estradiol 1 Mg Tabs (Estradiol) .... Once daily 4)  Amphetamine-dextroamphetamine 30 Mg Xr24h-cap (Amphetamine-dextroamphetamine) .... One by mouth once daily 5)  Betamethasone Dipropionate 0.05 % Crea (Betamethasone dipropionate) .... Apply to affected rash two times a day no longer than 2 weeks of continuous use 6)  Sumatriptan Succinate 100 Mg Tabs (Sumatriptan succinate) .... Take one tab by mouth as needed, may repeat once in 24 hours 7)  Fluticasone Propionate 50 Mcg/act Susp (Fluticasone propionate) .... Use 2 sprays in each nostril daily 8)  Effexor Xr 75 Mg Xr24h-cap (Venlafaxine hcl) .... One by mouth daily 9)  Amphetamine-dextroamphetamine 30 Mg Xr24h-cap (Amphetamine-dextroamphetamine) .... One tab by mouth daily may fill in one month 10)  Amphetamine-dextroamphetamine 30 Mg Xr24h-cap (Amphetamine-dextroamphetamine) .... One tab daily may fill in two months 11)  Amoxicillin-pot Clavulanate 875-125 Mg Tabs (Amoxicillin-pot clavulanate) .... One by mouth two times a day for 10 days  Patient Instructions: 1)  Acute sinusitis symptoms for less than 10 days are not helped by antibiotics. Use warm moist compresses, and over the counter decongestants( only as directed). Call if no improvement in 5-7 days, sooner if increasing pain, fever, or new symptoms.  Prescriptions: AMOXICILLIN-POT CLAVULANATE 875-125 MG TABS (AMOXICILLIN-POT CLAVULANATE) one by mouth two times a day for 10 days  #20 x 0   Entered and Authorized by:   [name] MD   Signed by:   [name]  MD on 10/16/2009   Method used:   Electronically to          [facility]  [phone]* (retail)       [address] [place], [place]  [address]       Ph: [phone] or [phone]       Fax: [phone]   RxID:   [id number]

## 2010-11-08 NOTE — Progress Notes (Signed)
Summary: Adderall question  Phone Note Call from Patient   Caller: Patient @ [phone] Call For: [name] MD Reason for Call: Refill Medication Summary of Call: Pt called to adv that she needs a refill RX for med:  Adderall ....... Pt can be reached on her cell # [phone] when same is ready for p/u.  Initial call taken by: Debbra Riding,  October 31, 2009 2:27 PM  Follow-up for Phone Call        Our records show pt received 3 Adderall written Rx in 12/21.  Attempted to call both work and home pnone, no answer, no machine for message. Sid Falcon LPN  November 01, 2009 10:26 AM  LMTCB Sid Falcon LPN  November 01, 2009 11:53 AM    Additional Follow-up for Phone Call Additional follow up Details #1::        Pt did come to office to pick-up RX.  Explained to pt she was given 3 written RX on 12/21.  Pt did not realize this, he gave "th paper" to the pharmacist in Dec. and did not realize 3 Rx were on it.  I called [facility] and they did have the other 2 on file.  Pt informed Additional Follow-up by: Sid Falcon LPN,  November 02, 2009 9:57 AM

## 2010-11-21 ENCOUNTER — Telehealth: Payer: Self-pay | Admitting: Family Medicine

## 2010-11-21 DIAGNOSIS — F329 Major depressive disorder, single episode, unspecified: Secondary | ICD-10-CM

## 2010-11-21 NOTE — Telephone Encounter (Signed)
Pt called and wants to know if Dr Caryl Never can increase her Abilify .02mg .    Pt says that she is crying at work and can not control. Pls call in to [facility].

## 2010-11-21 NOTE — Telephone Encounter (Signed)
Please advise 

## 2010-11-22 MED ORDER — ARIPIPRAZOLE 5 MG PO TABS: 5.0000 mg | ORAL_TABLET | Freq: Every day | ORAL | Status: DC

## 2010-11-22 NOTE — Telephone Encounter (Signed)
Increase Abilify to 5 mg qd and pt office follow up within one month.

## 2010-11-22 NOTE — Telephone Encounter (Signed)
Rx called in, pt informed of change in dose and need to schedule F/U within the month

## 2010-11-27 ENCOUNTER — Other Ambulatory Visit: Payer: Self-pay | Admitting: *Deleted

## 2010-11-27 MED ORDER — SUMATRIPTAN SUCCINATE 100 MG PO TABS: ORAL_TABLET | ORAL | Status: DC

## 2010-11-30 ENCOUNTER — Other Ambulatory Visit: Payer: Self-pay | Admitting: Family Medicine

## 2010-11-30 ENCOUNTER — Other Ambulatory Visit: Payer: Self-pay

## 2010-11-30 MED ORDER — HYDROCHLOROTHIAZIDE 25 MG PO TABS: 25.0000 mg | ORAL_TABLET | Freq: Every day | ORAL | Status: DC

## 2010-11-30 NOTE — Telephone Encounter (Signed)
[facility] day only , needs to be seen   [facility]

## 2010-12-11 ENCOUNTER — Encounter: Payer: Self-pay | Admitting: Family Medicine

## 2010-12-11 ENCOUNTER — Ambulatory Visit (INDEPENDENT_AMBULATORY_CARE_PROVIDER_SITE_OTHER): Payer: BC Managed Care – PPO | Admitting: Family Medicine

## 2010-12-11 VITALS — BP 120/90 | Temp 97.8°F | Ht 63.0 in | Wt 192.0 lb

## 2010-12-11 DIAGNOSIS — F329 Major depressive disorder, single episode, unspecified: Secondary | ICD-10-CM

## 2010-12-11 DIAGNOSIS — F3289 Other specified depressive episodes: Secondary | ICD-10-CM

## 2010-12-11 DIAGNOSIS — F988 Other specified behavioral and emotional disorders with onset usually occurring in childhood and adolescence: Secondary | ICD-10-CM

## 2010-12-11 DIAGNOSIS — F411 Generalized anxiety disorder: Secondary | ICD-10-CM

## 2010-12-11 DIAGNOSIS — F419 Anxiety disorder, unspecified: Secondary | ICD-10-CM

## 2010-12-11 MED ORDER — ALPRAZOLAM 0.5 MG PO TABS: 0.5000 mg | ORAL_TABLET | Freq: Every evening | ORAL | Status: DC | PRN

## 2010-12-11 MED ORDER — AMPHETAMINE-DEXTROAMPHET ER 30 MG PO CP24: 30.0000 mg | ORAL_CAPSULE | ORAL | Status: DC

## 2010-12-11 NOTE — Progress Notes (Signed)
  Subjective:    Patient ID: Tiffany Montes, female    DOB: May 22, 1956, 55 y.o.   MRN: [id number]  HPI  followup depression. Tapered off Effexor and started Wellbutrin last visit. Overall depression greatly improved. More energy and less depressed mood and increased motivation. Does have some anxiety symptoms off and on.  She wonders if Wellbutrin is making her more anxious, but symptoms are very intermittent. Occasional poor sleep. Has previously used alprazolam rarely for anxiety exacerbation. Overall she is pleased with the results of Wellbutrin.   History attention deficit disorder. Needs refills Adderall. No history of misuse. Started exercising more recently. Has lost some weight since last visit   Review of Systems  Constitutional: Negative for fever, chills and fatigue.  Eyes: Negative for visual disturbance.  Respiratory: Negative for cough, chest tightness and shortness of breath.   Cardiovascular: Negative for chest pain, palpitations and leg swelling.  Gastrointestinal: Negative for abdominal pain.  Genitourinary: Negative for dysuria.  Skin: Negative for rash.  Neurological: Negative for dizziness and headaches.  Hematological: Negative for adenopathy.  Psychiatric/Behavioral: Negative for agitation. The patient is nervous/anxious.        Objective:   Physical Exam  patient is alert and in no distress.  Oropharynx is clear Chest clear to auscultation  heart regular rate Extremities no edema  Mood is bright and improve compared with last visit       Assessment & Plan:   #1 depression improved continue Wellbutrin XL 300 mg daily. Reassess 4 months  #2 history of attention deficit disorder refilled Adderall #3 Anxiety-suspect situational.  Agreed to refill alprazolam for prn use rarely.

## 2010-12-24 ENCOUNTER — Other Ambulatory Visit: Payer: Self-pay | Admitting: Family Medicine

## 2010-12-27 ENCOUNTER — Other Ambulatory Visit: Payer: Self-pay | Admitting: *Deleted

## 2010-12-27 DIAGNOSIS — I10 Essential (primary) hypertension: Secondary | ICD-10-CM

## 2010-12-27 MED ORDER — HYDROCHLOROTHIAZIDE 25 MG PO TABS: 25.0000 mg | ORAL_TABLET | Freq: Every day | ORAL | Status: DC

## 2010-12-27 MED ORDER — SUMATRIPTAN SUCCINATE 100 MG PO TABS: ORAL_TABLET | ORAL | Status: DC

## 2010-12-28 ENCOUNTER — Other Ambulatory Visit: Payer: Self-pay | Admitting: *Deleted

## 2011-01-04 ENCOUNTER — Ambulatory Visit (INDEPENDENT_AMBULATORY_CARE_PROVIDER_SITE_OTHER): Payer: BC Managed Care – PPO | Admitting: Family Medicine

## 2011-01-04 ENCOUNTER — Encounter: Payer: Self-pay | Admitting: Family Medicine

## 2011-01-04 ENCOUNTER — Telehealth: Payer: Self-pay | Admitting: Family Medicine

## 2011-01-04 DIAGNOSIS — F329 Major depressive disorder, single episode, unspecified: Secondary | ICD-10-CM

## 2011-01-04 DIAGNOSIS — F3289 Other specified depressive episodes: Secondary | ICD-10-CM

## 2011-01-04 DIAGNOSIS — F419 Anxiety disorder, unspecified: Secondary | ICD-10-CM

## 2011-01-04 DIAGNOSIS — F411 Generalized anxiety disorder: Secondary | ICD-10-CM

## 2011-01-04 NOTE — Telephone Encounter (Signed)
Patient needs Abilify 5mg  qd and Wellbutrin XL 300mg  qd to be reordered through [facility].  Thank you

## 2011-01-04 NOTE — Patient Instructions (Signed)
Try leaving off Adderall.  If you are still feeling too anxious let me know and we may need to replace the Wellbutrin. Try to establish more regular exercise.

## 2011-01-04 NOTE — Progress Notes (Signed)
  Subjective:    Patient ID: Tiffany Montes, female    DOB: Apr 02, 1956, 55 y.o.   MRN: [id number]  HPI Patient seen for followup. Depression improved since starting Wellbutrin but sometimes feels somewhat anxious. Occurs more at work. Currently taking Wellbutrin as well as Abilify. Also takes Adderall for attention deficit issues. She has not had any headaches, dizziness, or any other side effects. Overall feels her depression is improved on Wellbutrin versus Effexor. Exercises inconsistently   Review of Systems  Constitutional: Negative for appetite change and unexpected weight change.  Respiratory: Negative for shortness of breath.   Cardiovascular: Negative for chest pain.  Neurological: Negative for dizziness, weakness and headaches.  Psychiatric/Behavioral: Negative for suicidal ideas, hallucinations and dysphoric mood. The patient is nervous/anxious.        Objective:   Physical Exam  Constitutional: She is oriented to person, place, and time. She appears well-developed and well-nourished.  Cardiovascular: Normal rate, regular rhythm and normal heart sounds.   No murmur heard. Pulmonary/Chest: Effort normal and breath sounds normal. She has no wheezes. She has no rales.  Musculoskeletal: She exhibits no edema.  Neurological: She is alert and oriented to person, place, and time.  Psychiatric: She has a normal mood and affect. Her behavior is normal.          Assessment & Plan:  #1 depression improved. Would recommend continued current regimen #2 anxiety. She'll try leaving off Adderall and we explained that sometimes ADD symptoms are improved with Wellbutrin. Would recommend more consistent exercise.  If anxiety symptoms continue may need to consider replacement for Wellbutrin

## 2011-01-05 ENCOUNTER — Encounter: Payer: Self-pay | Admitting: Family Medicine

## 2011-01-07 MED ORDER — ARIPIPRAZOLE 5 MG PO TABS: 5.0000 mg | ORAL_TABLET | Freq: Every day | ORAL | Status: DC

## 2011-01-07 MED ORDER — BUPROPION HCL ER (XL) 300 MG PO TB24: 300.0000 mg | ORAL_TABLET | ORAL | Status: DC

## 2011-01-10 ENCOUNTER — Telehealth: Payer: Self-pay | Admitting: *Deleted

## 2011-01-10 MED ORDER — BUPROPION HCL ER (XL) 300 MG PO TB24: 300.0000 mg | ORAL_TABLET | ORAL | Status: DC

## 2011-01-10 NOTE — Telephone Encounter (Signed)
I called pt, no answer, left message #[phone] Wellbutrin will be sent to [facility]

## 2011-01-10 NOTE — Telephone Encounter (Signed)
Pt has not received her Wellbutrin and Albilify  From [facility].   Is out and needs short term pres [facility]  Call pt.  Samples would be great.  Has Albilify but no Wellbutrin.  Will have to call [facility] to get permission and pt will give you information.

## 2011-02-19 ENCOUNTER — Other Ambulatory Visit: Payer: Self-pay | Admitting: *Deleted

## 2011-02-19 MED ORDER — ALPRAZOLAM 0.5 MG PO TABS: 0.5000 mg | ORAL_TABLET | Freq: Every evening | ORAL | Status: DC | PRN

## 2011-02-19 NOTE — Telephone Encounter (Signed)
Pt informed doctor would like her to taper of if possible, try 1/2 tab prn anxiety

## 2011-02-19 NOTE — Telephone Encounter (Signed)
Alprazolam refill request, #30 with 1 refill.  Last written 12/15/10.  Take one at HS prn

## 2011-02-19 NOTE — Telephone Encounter (Signed)
Refill once.  Would prefer that she try to taper off if possible.  Maybe she could try one half tablet as first step.

## 2011-02-22 NOTE — Op Note (Signed)
[facility]  Patient:    Tiffany Montes, Tiffany Montes Visit Number: [id number] MRN: [id number]          Service Type: DSU Location: [id number] Attending Physician:  Minette Headland Dictated by:   Freddy Finner, M.D. Proc. Date: 04/13/02 Admit Date:  04/13/2002                             Operative Report  PREOPERATIVE DIAGNOSIS:       Uterine leiomyomata, severe menorrhagia, dysmenorrhea.  POSTOPERATIVE DIAGNOSIS:      Uterine leiomyomata, severe menorrhagia, dysmenorrhea.  OPERATION:                    Laparoscopically assisted vaginal hysterectomy and bilateral salpingo-oophorectomy.  SURGEON:                      Freddy Finner, M.D.  INTRAOPERATIVE COMPLICATIONS:                None.  ESTIMATED INTRAOPERATIVE BLOOD LOSS:                   200 cc.  INDICATIONS:                  Details of the present illness are recorded in the admission note.  Patient is a 55 year old who previously had surgical sterilization and who has documented fibroids unresponsive to more conservative therapy.  She has requested definitive intervention.  She is admitted now for hysterectomy.  DESCRIPTION OF PROCEDURE:     She was admitted on the morning of surgery, given a bolus of Cefotan IV, placed in PAS hose, taken to the operating room, placed in the dorsal lithotomy position after induction of general endotracheal anesthesia.  Abdomen, perineum, and vagina were prepped in the usual fashion with Betadine scrub followed by solution.  Sterile drapes were applied.  Bladder was evacuated with a Robinson catheter.  Hulka tenaculum was attached to the cervix under direct visualization.  Sterile drapes were applied.  Two small incisions were made, one from the umbilicus and one just above the symphysis.  A 12 mm trocar was introduced at the umbilicus following the abdominal wall manually.  Direct inspection revealed adequate placement with no evidence of injury on  entry.  Pneumoperitoneum was allowed to accumulate with carbon dioxide gas.  A 5 mm trocar was placed in the lower incision under direct visualization.  A blunt probe and later grasping forceps were used through this trocar sleeve.  Systematic examination of the abdominal and pelvic contents was carried out.  There were some minimal adhesions around the right ovary and fallopian tube along the infundibulopelvic and upper broad ligament.  These were easily freed in the process of the dissection.  There was no evidence of peritoneal disease.  The uterus was enlarged with fibroids. The appendix was normal.  There was a recent corpus luteum on the left ovary. Using the tripolar 5 mm device through the operating chamber of the laparoscope, the infundibulopelvic and upper broad ligaments were progressively fulgurated with bipolar coagulation and divided.  This was carried down to the level just above the vessels.  This was performed in a similar fashion on both sides.  Attention was then turned vaginally. Posterior weighted vaginal retractor was placed.  The gas had been allowed to escape from the abdominal cavity, but the trocar sleeves were left in place.  After placing the posterior weighted retractor, the cervix was grasped with a Jacobs tenaculum and the Hulka tenaculum removed.  Posterior colpotomy incision was made while tenting the mucosa posterior to the cervix with an Allis.  Cervix was circumscribed with the scalpel.  Using the plasma coagulator, Heaney bipolar device, the uterosacral pedicles, cardinal ligament pedicles, and bladder pillars were taken, sealed, and divided.  The bladder was carefully advanced off the cervix and the anterior peritoneum entered. The vessel pedicles were taken with the same coagulation device as well as an additional pedicle above the vessels on either side.  Uterus was then delivered through the vaginal introitus.  One remaining segment of tissue, mostly  peritoneum, was present on the right which was coagulated and divided. Angles of the vagina were anchored to the uterosacrals with a mattress suture of 0 Vicryl.  Uterosacrals were plicated with interrupted sutures of 0 Monocryl x 2.  Cuff was closed vertically with figure-of-eights of 0 Monocryl. Foley catheter was replaced.  Reinspection laparoscopically using the Nezhat irrigation system did identify a bleeding source along the right pelvic side wall which was coagulated with bipolar coagulation.  Great care was taken on this side to avoid injury to the ureter which was near the dissection.  The left side was normal and required no further attention.  Photographs were made during the procedure including pre and postoperative photos.  These were retained in the office record.  After completing the procedure with adequate hemostasis, the irrigating solution was aspirated from the abdomen.  All the instruments were removed.  The skin incisions were closed with interrupted subcuticular stitches of 3-0 Dexon.  Plain Marcaine 0.25% was injected at sites for postoperative analgesia.  The patient was awakened and taken to recovery in good condition. Dictated by:   Freddy Finner, M.D. Attending Physician:  Minette Headland DD:  04/13/02 TD:  04/13/02 Job: [id number]

## 2011-02-22 NOTE — H&P (Signed)
[facility]  Patient:    Tiffany Montes, Tiffany Montes Visit Number: [id number] MRN: [id number]          Service Type: DSU Location: 9300 [id number] Attending Physician:  Minette Headland Dictated by:   Freddy Finner, M.D. Admit Date:  04/13/2002                           History and Physical  ADMITTING DIAGNOSES:          1. Uterine fibroids.                               2. Menorrhagia unresponsive to cyclic hormonal                                  therapy.                               3. Anemia.  HISTORY OF PRESENT ILLNESS:   The patient is a 55 year old, white, married female, gravida 3, para 2, who has a longstanding history of uterine leiomyomata.  In 2000, she had a sonohysterogram for extreme menorrhagia with a finding of a normal uterine cavity.  She was tried on oral contraceptives which accentuated her migraines.  She was subsequently tried on Vivelle skin patch to which we later added cyclic progestin to regulate the interval of her menses.  Her uterus is enlarged to approximately eight weeks size.  At the present time, she is complaining of heavy flooding with each period, changing a tampon every 1-2 hours on the heaviest day and episodic soiling of her clothing on the first two days of her menses.  She has previously had surgical sterilization in 1988.  She also has had normal Pap smears on a regular basis in the office and administration in April 2003.  Her current review of systems is otherwise negative except for a longstanding history of migraine for which she is medicated.  There are no other cardiopulmonary, GI, GU complaints.  PAST MEDICAL HISTORY:         Migraines for which she takes Wellbutrin, Lexapro with Imitrex.  She has no other known significant medical illnesses.  CURRENT MEDICATIONS:          1. Cenestin 1.25 mg q.d.                               2. Prometrium 200 mg q.d. for 12 days of each       cycle.                               3. Iron supplement.                               4. Multiple vitamin.                               5. Calcium replacement.  6. Aspirin 1 q.d.  PAST SURGICAL HISTORY:        1. Tubal ligation, noted above.                               2. 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  ALLERGIES:                    She has no known allergies to medications.  Her current medications are noted above.  She has never had a blood transfusion.  HABITS:                       She does not use cigarettes.  FAMILY HISTORY:               Noncontributory.  MAMMOGRAM:                    A recent mammogram in April 2003, was normal.  PHYSICAL EXAMINATION:  HEENT:                        Grossly within normal limits.  VITAL SIGNS:                  Blood pressure in the office is 134/80, weight 175.  Most recent hemoglobin in the office was 10.5.  NECK:                         Supple.  No palpable thyromegaly.  BREAST:                       Normal.  No nipple discharge.  No skin changes or palpable masses.  HEART:                        Normal sinus rhythm without murmurs, rubs or gallops.  LUNGS:                        Clear to auscultation.  ABDOMEN:                      Soft and nontender with no appreciable organomegaly or palpable masses.  PELVIC:                       External genitalia are normal.  Cervix is normal to inspection.  Bimanual reveals the uterus to be approximately eight weeks size and irregularly nodular and in a retroverted position.  There are no palpable adnexal masses.  RECTUM:                       Normal and confirms the bimanual exam.  ASSESSMENT:                   1. Uterine leiomyomata.  2. Perimenopausal by symptoms and response  to                                  estrogen replacement.                               3. Menorrhagia with flooding accidents and                                  secondary anemia.  PLAN:                         1. Laparoscopic-assisted vaginal hysterectomy.                               2. Bilateral salpingo-oophorectomy. Dictated by:   Freddy Finner, M.D. Attending Physician:  Minette Headland DD:  04/12/02 TD:  04/12/02 Job: [id number]

## 2011-02-22 NOTE — Discharge Summary (Signed)
[facility]  Patient:    Tiffany Montes, Tiffany Montes Visit Number: [id number] MRN: [id number]          Service Type: GYN Location: 9300 [id number] Attending Physician:  Minette Headland Dictated by:   Freddy Finner, M.D. Admit Date:  04/13/2002 Discharge Date: 04/15/2002                             Discharge Summary  DISCHARGE DIAGNOSES:          1. Uterine leiomyomata.                               2. Clinical symptoms of severe menorrhagia.  PROCEDURE:                    Laparoscopically-assisted vaginal hysterectomy, bilateral salpingo-oophorectomy.  COMPLICATIONS:                Ileus.  DISPOSITION:                  The patient is in satisfactory improved condition at the time of her discharge.  She is to have progressively increasing physical activity.  She is to avoid vaginal entry or heavy lifting. She is to call for fever or heavy bleeding.  She is to take Percocet 5 mg as needed for postoperative pain.  She is given Xanax 0.25 mg to be taken one t.i.d. p.r.n.  She is to continue with Cenest and her hormone replacement therapy.  She is to see me in approximately 10 days for postoperative follow-up.  HISTORY OF PRESENT ILLNESS: PAST MEDICAL HISTORY: FAMILY HISTORY: REVIEW OF SYSTEMS: PHYSICAL EXAMINATION:         Details recorded in the admission note. Physical findings on admission were remarkable for enlargement of the uterus which was irregularly nodular.  LABORATORY DATA:              Admission hemoglobin 11.8, on postoperative day #1 it was 11.1 and 10.3 on postoperative day #2.  Admission prothrombin time and PTT were normal.  HOSPITAL COURSE:              The patient was admitted on the morning of surgery. She was treated perioperatively with PAS hose and was given IV antibiotics preoperatively.  The above described operative procedure was accomplished without difficulty.  Her postoperative course was remarkable only for a very  slow return of bowel function.  On the morning of surgery, she was still having some difficulty with this, but requested discharge.  Since this is a longstanding chronic problem for her and she feels like she can manage it better at home.  She was discharged home with disposition as noted above. Dictated by:   Freddy Finner, M.D. Attending Physician:  Minette Headland DD:  04/15/02 TD:  04/18/02 Job: [id number]

## 2011-04-02 ENCOUNTER — Telehealth: Payer: Self-pay | Admitting: *Deleted

## 2011-04-02 DIAGNOSIS — F988 Other specified behavioral and emotional disorders with onset usually occurring in childhood and adolescence: Secondary | ICD-10-CM

## 2011-04-02 NOTE — Telephone Encounter (Signed)
Rx refill of adderall

## 2011-04-03 MED ORDER — AMPHETAMINE-DEXTROAMPHET ER 30 MG PO CP24: 30.0000 mg | ORAL_CAPSULE | ORAL | Status: DC

## 2011-04-03 NOTE — Telephone Encounter (Signed)
May refill 

## 2011-04-03 NOTE — Telephone Encounter (Signed)
Last filled X three,  12/11/10

## 2011-04-03 NOTE — Telephone Encounter (Signed)
Pt informed Rx ready for pick up. 

## 2011-04-23 ENCOUNTER — Other Ambulatory Visit: Payer: Self-pay | Admitting: *Deleted

## 2011-04-23 NOTE — Telephone Encounter (Signed)
Faxed refill request for Alprazolam 0.5, take 1/2 to 1 tab at bedtime as needed Last filled 02-19-11 #30 with 0 refills

## 2011-04-26 ENCOUNTER — Other Ambulatory Visit: Payer: Self-pay | Admitting: *Deleted

## 2011-04-26 MED ORDER — ALPRAZOLAM 0.5 MG PO TABS: 0.5000 mg | ORAL_TABLET | Freq: Every evening | ORAL | Status: DC | PRN

## 2011-04-26 NOTE — Telephone Encounter (Signed)
Alprazolam refill request 0.5  Last filled on 02-19-11, #30 with 0 refills

## 2011-04-26 NOTE — Telephone Encounter (Signed)
Refill once OK. 

## 2011-05-06 MED ORDER — ALPRAZOLAM 0.5 MG PO TABS: 0.5000 mg | ORAL_TABLET | Freq: Every evening | ORAL | Status: DC | PRN

## 2011-05-06 NOTE — Telephone Encounter (Signed)
Ok to refill once

## 2011-05-06 NOTE — Telephone Encounter (Signed)
Please advise 

## 2011-06-06 ENCOUNTER — Ambulatory Visit (INDEPENDENT_AMBULATORY_CARE_PROVIDER_SITE_OTHER): Payer: BC Managed Care – PPO | Admitting: Family Medicine

## 2011-06-06 ENCOUNTER — Encounter: Payer: Self-pay | Admitting: Family Medicine

## 2011-06-06 DIAGNOSIS — G43909 Migraine, unspecified, not intractable, without status migrainosus: Secondary | ICD-10-CM

## 2011-06-06 DIAGNOSIS — F329 Major depressive disorder, single episode, unspecified: Secondary | ICD-10-CM

## 2011-06-06 DIAGNOSIS — I10 Essential (primary) hypertension: Secondary | ICD-10-CM

## 2011-06-06 DIAGNOSIS — E039 Hypothyroidism, unspecified: Secondary | ICD-10-CM

## 2011-06-06 LAB — BASIC METABOLIC PANEL
BUN: 19 mg/dL (ref 6–23)
CO2: 29 mEq/L (ref 19–32)
Chloride: 104 mEq/L (ref 96–112)
Glucose, Bld: 107 mg/dL — ABNORMAL HIGH (ref 70–99)
Potassium: 4.3 mEq/L (ref 3.5–5.1)

## 2011-06-06 MED ORDER — LEVOTHYROXINE SODIUM 50 MCG PO TABS: 50.0000 ug | ORAL_TABLET | Freq: Every day | ORAL | Status: DC

## 2011-06-06 MED ORDER — ARIPIPRAZOLE 5 MG PO TABS: 5.0000 mg | ORAL_TABLET | Freq: Every day | ORAL | Status: DC

## 2011-06-06 MED ORDER — LISINOPRIL-HYDROCHLOROTHIAZIDE 10-12.5 MG PO TABS: 1.0000 | ORAL_TABLET | Freq: Every day | ORAL | Status: DC

## 2011-06-06 MED ORDER — SUMATRIPTAN SUCCINATE 100 MG PO TABS: ORAL_TABLET | ORAL | Status: DC

## 2011-06-06 NOTE — Progress Notes (Signed)
  Subjective:    Patient ID: Tiffany Montes, female    DOB: 1955-11-27, 55 y.o.   MRN: [id number]  HPI Patient seen for medical followup. She has hypothyroidism, history of hyperlipidemia, recurrent depression, ADD, and mild hypertension. Not monitoring blood pressures at home. Mild elevations here consistently. Currently takes HCTZ. Does not recall any prior treatment with other antihypertensives. No headaches or dizziness.  Hypothyroidism treated with levothyroxine 50 mcg daily. Compliant with therapy. Due for repeat lab work. Generally feels very well at this time. Depression stable. She feels improved on Wellbutrin and also takes Abilify 5 mg one half tablet at night and this combination seems to be working well for her.  Migraine headaches treated with Imitrex. This seems to work well for her headaches which are fairly few and far between.  Past Medical History  Diagnosis Date  . Depression   . Attention deficit disorder   . Thyroid disease     hypothyroid  . Hypertension   . Hyperlipidemia    Past Surgical History  Procedure Date  . Abdominal hysterectomy 2003    reports that she has never smoked. She does not have any smokeless tobacco history on file. She reports that she does not drink alcohol or use illicit drugs. family history includes Cancer in her father and Diabetes in her father. Allergies  Allergen Reactions  . Morphine Sulfate     REACTION: GI upset      Review of Systems  Constitutional: Negative for fatigue.  Eyes: Negative for visual disturbance.  Respiratory: Negative for cough, chest tightness, shortness of breath and wheezing.   Cardiovascular: Negative for chest pain, palpitations and leg swelling.  Neurological: Negative for dizziness, seizures, syncope, weakness, light-headedness and headaches.       Objective:   Physical Exam  Constitutional: She is oriented to person, place, and time. She appears well-developed and well-nourished.  Neck: Neck  supple. No thyromegaly present.  Cardiovascular: Normal rate and regular rhythm.   Pulmonary/Chest: Effort normal and breath sounds normal. No respiratory distress. She has no wheezes. She has no rales.  Musculoskeletal: She exhibits no edema.  Lymphadenopathy:    She has no cervical adenopathy.  Neurological: She is alert and oriented to person, place, and time.  Psychiatric: She has a normal mood and affect. Her behavior is normal.          Assessment & Plan:  #1 hypothyroidism. Recheck TSH. Refilled levothyroxine for one year #2 migraine headaches. Continue Imitrex as needed with refills given  #3 recurrent depression currently stable. Refilled Abilify #4 hypertension. Poorly controlled. Discontinue HCTZ. Start lisinopril HCTZ 10/12.5 one daily. Reassess blood pressure one month. Continue weight loss efforts.

## 2011-06-07 NOTE — Progress Notes (Signed)
Quick Note:  Pt informed on VM ______ 

## 2011-07-01 ENCOUNTER — Telehealth: Payer: Self-pay | Admitting: *Deleted

## 2011-07-01 DIAGNOSIS — F988 Other specified behavioral and emotional disorders with onset usually occurring in childhood and adolescence: Secondary | ICD-10-CM

## 2011-07-01 MED ORDER — AMPHETAMINE-DEXTROAMPHET ER 30 MG PO CP24: 30.0000 mg | ORAL_CAPSULE | ORAL | Status: DC

## 2011-07-01 NOTE — Telephone Encounter (Signed)
Pt. Was calling to ask if she was to stop HCTZ before starting the Lisinopril.  Advised that she should as her Lisinopril has a diuretic in it.

## 2011-07-01 NOTE — Telephone Encounter (Signed)
Pt needs new rx generic adderall 30 mg °

## 2011-07-01 NOTE — Telephone Encounter (Signed)
Stop the HCTZ before lisinopril hctz.  Refill Adderall.

## 2011-07-01 NOTE — Telephone Encounter (Signed)
Pt informed and Rx will be ready to pick-up tomorrow

## 2011-07-05 ENCOUNTER — Ambulatory Visit: Payer: BC Managed Care – PPO | Admitting: Family Medicine

## 2011-08-19 ENCOUNTER — Encounter: Payer: Self-pay | Admitting: Internal Medicine

## 2011-08-19 ENCOUNTER — Other Ambulatory Visit: Payer: Self-pay | Admitting: *Deleted

## 2011-08-19 ENCOUNTER — Ambulatory Visit (INDEPENDENT_AMBULATORY_CARE_PROVIDER_SITE_OTHER): Payer: BC Managed Care – PPO | Admitting: Internal Medicine

## 2011-08-19 DIAGNOSIS — R1011 Right upper quadrant pain: Secondary | ICD-10-CM

## 2011-08-19 DIAGNOSIS — E785 Hyperlipidemia, unspecified: Secondary | ICD-10-CM

## 2011-08-19 DIAGNOSIS — F988 Other specified behavioral and emotional disorders with onset usually occurring in childhood and adolescence: Secondary | ICD-10-CM

## 2011-08-19 DIAGNOSIS — F329 Major depressive disorder, single episode, unspecified: Secondary | ICD-10-CM

## 2011-08-19 NOTE — Telephone Encounter (Signed)
A Dr Caryl Never pt, pt out of office this week.  Alprazolam 0.5 refill request, last filled on 05-06-11, #30 with 0 refills Please advise

## 2011-08-19 NOTE — Patient Instructions (Signed)
Abdominal ultrasound in the morning as discussed  Call or return to clinic prn if these symptoms worsen or fail to improve as anticipated.  

## 2011-08-19 NOTE — Telephone Encounter (Signed)
10 tabs okay per Dr Tawanna Cooler

## 2011-08-19 NOTE — Progress Notes (Signed)
  Subjective:    Patient ID: Tiffany Montes, female    DOB: [birth year], 55 y.o.   MRN: [id number]  HPI  55 year old patient who presents with a 4 week history of postprandial right upper quadrant pain with radiation to the right flank and back area. No nausea vomiting or weight loss. Pain is often bothersome during the night. She has treated hypertension which has been stable. She has a history also of mild exogenous obesity    Review of Systems  Constitutional: Negative.   HENT: Negative for hearing loss, congestion, sore throat, rhinorrhea, dental problem, sinus pressure and tinnitus.   Eyes: Negative for pain, discharge and visual disturbance.  Respiratory: Negative for cough and shortness of breath.   Cardiovascular: Negative for chest pain, palpitations and leg swelling.  Gastrointestinal: Positive for abdominal pain. Negative for nausea, vomiting, diarrhea, constipation, blood in stool and abdominal distention.  Genitourinary: Negative for dysuria, urgency, frequency, hematuria, flank pain, vaginal bleeding, vaginal discharge, difficulty urinating, vaginal pain and pelvic pain.  Musculoskeletal: Negative for joint swelling, arthralgias and gait problem.  Skin: Negative for rash.  Neurological: Negative for dizziness, syncope, speech difficulty, weakness, numbness and headaches.  Hematological: Negative for adenopathy.  Psychiatric/Behavioral: Negative for behavioral problems, dysphoric mood and agitation. The patient is not nervous/anxious.        Objective:   Physical Exam  Constitutional: She is oriented to person, place, and time. She appears well-developed and well-nourished.  HENT:  Head: Normocephalic.  Right Ear: External ear normal.  Left Ear: External ear normal.  Mouth/Throat: Oropharynx is clear and moist.  Eyes: Conjunctivae and EOM are normal. Pupils are equal, round, and reactive to light.  Neck: Normal range of motion. Neck supple. No thyromegaly present.    Cardiovascular: Normal rate, regular rhythm, normal heart sounds and intact distal pulses.   Pulmonary/Chest: Effort normal and breath sounds normal.  Abdominal: Soft. Bowel sounds are normal. She exhibits no mass. There is tenderness.       Mild right upper quadrant tenderness  Musculoskeletal: Normal range of motion.  Lymphadenopathy:    She has no cervical adenopathy.  Neurological: She is alert and oriented to person, place, and time.  Skin: Skin is warm and dry. No rash noted.  Psychiatric: She has a normal mood and affect. Her behavior is normal.          Assessment & Plan:    Probable symptomatic cholelithiasis Hypertension stable  We'll set up for abdominal ultrasound.

## 2011-08-20 ENCOUNTER — Ambulatory Visit
Admission: RE | Admit: 2011-08-20 | Discharge: 2011-08-20 | Disposition: A | Discharge status: Home / Self Care | Payer: BC Managed Care – PPO | Source: Ambulatory Visit | Attending: Internal Medicine | Admitting: Internal Medicine

## 2011-08-20 DIAGNOSIS — R1011 Right upper quadrant pain: Secondary | ICD-10-CM

## 2011-08-21 ENCOUNTER — Other Ambulatory Visit: Payer: Self-pay | Admitting: Family Medicine

## 2011-08-21 ENCOUNTER — Other Ambulatory Visit: Payer: Self-pay | Admitting: *Deleted

## 2011-08-21 DIAGNOSIS — K8020 Calculus of gallbladder without cholecystitis without obstruction: Secondary | ICD-10-CM

## 2011-08-21 MED ORDER — ALPRAZOLAM 0.5 MG PO TABS: ORAL_TABLET | ORAL | Status: DC

## 2011-08-21 NOTE — Progress Notes (Signed)
Quick Note:  Spoke with pt- informed of results and terri will be calling with referral appt to surgeon. Pt state cell# best # TO LEAVE MSG     ______

## 2011-08-21 NOTE — Telephone Encounter (Signed)
Pt is aware waiting for nurse to call rx into pharm

## 2011-08-21 NOTE — Telephone Encounter (Signed)
Alprazolam refill request, 1/2 to 1 tab prn anxiety Last filled 05/06/11, #30 with 0 refills Dr Caryl Never pt, he is out of office, please advise.  Thank you

## 2011-08-21 NOTE — Telephone Encounter (Signed)
Okay to give 10 tabs no refills.  Same direction

## 2011-08-21 NOTE — Telephone Encounter (Signed)
Rx called in, #10 only per Dr Tawanna Cooler

## 2011-09-03 ENCOUNTER — Other Ambulatory Visit: Payer: Self-pay | Admitting: Family Medicine

## 2011-09-05 ENCOUNTER — Encounter (INDEPENDENT_AMBULATORY_CARE_PROVIDER_SITE_OTHER): Payer: Self-pay | Admitting: Surgery

## 2011-09-05 ENCOUNTER — Ambulatory Visit (INDEPENDENT_AMBULATORY_CARE_PROVIDER_SITE_OTHER): Payer: BC Managed Care – PPO | Admitting: Surgery

## 2011-09-05 VITALS — BP 128/84 | HR 72 | Temp 98.1°F | Resp 16 | Ht 63.5 in | Wt 178.4 lb

## 2011-09-05 DIAGNOSIS — K8020 Calculus of gallbladder without cholecystitis without obstruction: Secondary | ICD-10-CM

## 2011-09-05 HISTORY — DX: Calculus of gallbladder without cholecystitis without obstruction: K80.20

## 2011-09-05 NOTE — Patient Instructions (Signed)
We will schedule you for surgery to have your gallbladder removed. If you have any questions before surgery please call the office

## 2011-09-05 NOTE — Progress Notes (Signed)
NAME: Tiffany Montes                                                                                      DOB: April 07, 1956 DATE: 09/05/2011               MRN: [id number]   CC:  Chief Complaint  Patient presents with  . Other    new pt- eval GB with stones    HPI:  Tiffany Montes is a 55 y.o.  female who was referred  by Dr. Lavenia Atlas evaluation of Gallsotones  PMH:  has a past medical history of Depression; Attention deficit disorder; Thyroid disease; Hypertension; Hyperlipidemia; and Gallstones (09/05/2011).  PSH:   has past surgical history that includes Abdominal hysterectomy (2003).  ALLERGIES:   Allergies  Allergen Reactions  . Morphine Sulfate     REACTION: GI upset    MEDICATIONS: Current outpatient prescriptions:ALPRAZolam (XANAX) 0.5 MG tablet, 1/2 to 1 tab prn for anxiety, Disp: 10 tablet, Rfl: 0;  amphetamine-dextroamphetamine (ADDERALL XR) 30 MG 24 hr capsule, Take 1 capsule (30 mg total) by mouth every morning., Disp: 30 capsule, Rfl: 0;  amphetamine-dextroamphetamine (ADDERALL XR) 30 MG 24 hr capsule, Take 1 capsule (30 mg total) by mouth every morning. May fill in one month, Disp: 30 capsule, Rfl: 0 amphetamine-dextroamphetamine (ADDERALL XR) 30 MG 24 hr capsule, Take 1 capsule (30 mg total) by mouth every morning. May fill in two months, Disp: 30 capsule, Rfl: 0;  ARIPiprazole (ABILIFY) 5 MG tablet, Take 1 tablet (5 mg total) by mouth daily., Disp: 90 tablet, Rfl: 3;  buPROPion (WELLBUTRIN XL) 300 MG 24 hr tablet, Take 1 tablet (300 mg total) by mouth every morning., Disp: 30 tablet, Rfl: 0 estradiol (ESTRACE) 1 MG tablet, Take 1 mg by mouth daily.  , Disp: , Rfl: ;  fluticasone (FLONASE) 50 MCG/ACT nasal spray, USE 2 SPRAYS IN EACH NOSTRIL DAILY, Disp: 16 g, Rfl: 6;  levothyroxine (LEVOTHROID) 50 MCG tablet, Take 1 tablet (50 mcg total) by mouth daily., Disp: 90 tablet, Rfl: 3;  lisinopril-hydrochlorothiazide (PRINZIDE,ZESTORETIC) 10-12.5 MG per tablet, Take 1 tablet  by mouth daily., Disp: 90 tablet, Rfl: 3 naproxen sodium (ANAPROX) 220 MG tablet, Take 440 mg by mouth 2 (two) times daily as needed.  , Disp: , Rfl: ;  SUMAtriptan (IMITREX) 100 MG tablet, One tab by mouth as needed for migraine, may repeat once in 24 hours, Disp: 30 tablet, Rfl: 1  ROS: She has filled out our 12 point review of systems and it is negative except for abdominal pain   EXAM:   GENERAL:  The patient is alert, oriented, and generally healthy-appearing, NAD. Mood and affect are normal.  HEENT:  The head is normocephalic, the eyes nonicteric, the pupils were round regular and equal. EOMs are normal. Pharynx normal. Dentition good.  NECK:  The neck is supple and there are no masses or thyromegaly.  LUNGS:  Normal respirations and clear to auscultation.  HEART:  Regular rhythm, with no murmurs rubs or gallops. Pulses are intact carotid dorsalis pedis and posterior tibial. No significant varicosities are noted.  ABDOMEN:  Soft, flat, and nontender. No masses  or organomegaly is noted. No hernias are noted. Bowel sounds are normal.    EXTREMITIES:  Good range of motion, no edema.   DATA REVIEWED:  Her Epic chart is reviewed and the ultrasound noted  IMPRESSION:  Symptomatic gallstones  PLAN:   Cholecystectomy. We will try to schedule at her convenience. I have discussed the indications for laparoscopic cholecystectomy with her and provided educational material. We have discussed the risks of surgery, including general risks such as bleeding, infection, lung and heart issues etc. We have also discussed the potential for injuries to other organs, bile duct leaks, and other unexpected events. We have also talked about the fact that this may need to be converted to open under certain circumstances. We discussed the typical post op recovery and the fact that there is a good likelihood of improvement in symptoms and return to normal activity.  She understands this and wishes  to proceed to schedule surgery. I believe all of .his questions have been answered.     Casy Brunetto J 09/05/2011 3:07 PM

## 2011-09-09 ENCOUNTER — Other Ambulatory Visit: Payer: Self-pay | Admitting: *Deleted

## 2011-09-09 MED ORDER — ALPRAZOLAM 0.5 MG PO TABS: ORAL_TABLET | ORAL | Status: DC

## 2011-09-09 NOTE — Telephone Encounter (Signed)
Alprazolam refill request, 1/2 to 1 tab prn anxiety.  Last filled by Dr Tawanna Cooler on 08/21/11, #10 with 0 refills.   Prior to that refills were #30 with 0 refills

## 2011-09-09 NOTE — Telephone Encounter (Signed)
OK to refill #30 with 2 refills.

## 2011-09-11 ENCOUNTER — Encounter (HOSPITAL_COMMUNITY): Payer: Self-pay | Admitting: Pharmacy Technician

## 2011-09-13 ENCOUNTER — Encounter (HOSPITAL_COMMUNITY): Payer: Self-pay

## 2011-09-13 ENCOUNTER — Encounter (HOSPITAL_COMMUNITY)
Admission: RE | Admit: 2011-09-13 | Discharge: 2011-09-13 | Disposition: A | Discharge status: Home / Self Care | Payer: BC Managed Care – PPO | Source: Ambulatory Visit | Attending: Family Medicine | Admitting: Family Medicine

## 2011-09-13 ENCOUNTER — Encounter (HOSPITAL_COMMUNITY)
Admission: RE | Admit: 2011-09-13 | Discharge: 2011-09-13 | Disposition: A | Discharge status: Home / Self Care | Payer: BC Managed Care – PPO | Source: Ambulatory Visit | Attending: Anesthesiology | Admitting: Anesthesiology

## 2011-09-13 ENCOUNTER — Other Ambulatory Visit: Payer: Self-pay

## 2011-09-13 DIAGNOSIS — K8020 Calculus of gallbladder without cholecystitis without obstruction: Secondary | ICD-10-CM

## 2011-09-13 HISTORY — DX: Migraine, unspecified, not intractable, without status migrainosus: G43.909

## 2011-09-13 HISTORY — DX: Other specified postprocedural states: R11.2

## 2011-09-13 HISTORY — DX: Anxiety disorder, unspecified: F41.9

## 2011-09-13 HISTORY — DX: Other specified postprocedural states: Z98.890

## 2011-09-13 LAB — CBC
HCT: 40.5 % (ref 36.0–46.0)
Hemoglobin: 13.7 g/dL (ref 12.0–15.0)
MCHC: 33.8 g/dL (ref 30.0–36.0)
RDW: 12.4 % (ref 11.5–15.5)
WBC: 7 10*3/uL (ref 4.0–10.5)

## 2011-09-13 LAB — COMPREHENSIVE METABOLIC PANEL
ALT: 12 U/L (ref 0–35)
Albumin: 3.8 g/dL (ref 3.5–5.2)
Alkaline Phosphatase: 55 U/L (ref 39–117)
BUN: 28 mg/dL — ABNORMAL HIGH (ref 6–23)
Chloride: 101 mEq/L (ref 96–112)
Potassium: 4.2 mEq/L (ref 3.5–5.1)
Sodium: 139 mEq/L (ref 135–145)
Total Bilirubin: 0.3 mg/dL (ref 0.3–1.2)
Total Protein: 6.9 g/dL (ref 6.0–8.3)

## 2011-09-13 LAB — LIPASE, BLOOD: Lipase: 63 U/L — ABNORMAL HIGH (ref 11–59)

## 2011-09-13 LAB — URINALYSIS, ROUTINE W REFLEX MICROSCOPIC
Bilirubin Urine: NEGATIVE
Glucose, UA: NEGATIVE mg/dL
Hgb urine dipstick: NEGATIVE
Ketones, ur: NEGATIVE mg/dL
Nitrite: NEGATIVE
Specific Gravity, Urine: 1.01 (ref 1.005–1.030)
pH: 6.5 (ref 5.0–8.0)

## 2011-09-13 NOTE — Pre-Procedure Instructions (Signed)
20 Tiffany Montes  09/13/2011   Your procedure is scheduled on:  September 26, 2011  Report to [facility] at 0530 AM.  Call this number if you have problems the morning of surgery: [phone]   Remember:   Do not eat food:After Midnight.  May have clear liquids: up to 4 Hours before arrival.  Clear liquids include soda, tea, black coffee, apple or grape juice, broth.  Take these medicines the morning of surgery with A SIP OF WATER: Xanax, Flonase, Levothyroxine,    STOP Naproxen December 15 may use tylenol    Do not wear jewelry, make-up or nail polish.  Do not wear lotions, powders, or perfumes. You may wear deodorant.  Do not shave 48 hours prior to surgery.  Do not bring valuables to the hospital.  Contacts, dentures or bridgework may not be worn into surgery.  Leave suitcase in the car. After surgery it may be brought to your room.  For patients admitted to the hospital, checkout time is 11:00 AM the day of discharge.   Patients discharged the day of surgery will not be allowed to drive home.  Name and phone number of your driver: Acie Fredrickson [phone]  Special Instructions: CHG Shower Use Special Wash: 1/2 bottle night before surgery and 1/2 bottle morning of surgery.   Please read over the following fact sheets that you were given: Pain Booklet, Coughing and Deep Breathing and Surgical Site Infection Prevention

## 2011-09-25 MED ORDER — CEFAZOLIN SODIUM-DEXTROSE 2-3 GM-% IV SOLR
2.0000 g | INTRAVENOUS | Status: AC
  Administered 2011-09-26: 2 g via INTRAVENOUS
  Filled 2011-09-25: qty 50

## 2011-09-26 ENCOUNTER — Other Ambulatory Visit (INDEPENDENT_AMBULATORY_CARE_PROVIDER_SITE_OTHER): Payer: Self-pay | Admitting: Surgery

## 2011-09-26 ENCOUNTER — Encounter (HOSPITAL_COMMUNITY)
Admission: RE | Disposition: A | Discharge status: Home / Self Care | Payer: Self-pay | Source: Ambulatory Visit | Attending: Surgery

## 2011-09-26 ENCOUNTER — Observation Stay (HOSPITAL_COMMUNITY)
Admission: RE | Admit: 2011-09-26 | Discharge: 2011-09-27 | Disposition: A | Discharge status: Home / Self Care | Payer: BC Managed Care – PPO | Source: Ambulatory Visit | Attending: Surgery | Admitting: Surgery

## 2011-09-26 ENCOUNTER — Encounter (HOSPITAL_COMMUNITY): Payer: Self-pay | Admitting: *Deleted

## 2011-09-26 ENCOUNTER — Ambulatory Visit (HOSPITAL_COMMUNITY): Payer: BC Managed Care – PPO

## 2011-09-26 DIAGNOSIS — K8010 Calculus of gallbladder with chronic cholecystitis without obstruction: Principal | ICD-10-CM | POA: Insufficient documentation

## 2011-09-26 DIAGNOSIS — F329 Major depressive disorder, single episode, unspecified: Secondary | ICD-10-CM

## 2011-09-26 DIAGNOSIS — Z01810 Encounter for preprocedural cardiovascular examination: Secondary | ICD-10-CM | POA: Insufficient documentation

## 2011-09-26 DIAGNOSIS — F988 Other specified behavioral and emotional disorders with onset usually occurring in childhood and adolescence: Secondary | ICD-10-CM

## 2011-09-26 DIAGNOSIS — Z01818 Encounter for other preprocedural examination: Secondary | ICD-10-CM | POA: Insufficient documentation

## 2011-09-26 DIAGNOSIS — Z01812 Encounter for preprocedural laboratory examination: Secondary | ICD-10-CM | POA: Insufficient documentation

## 2011-09-26 DIAGNOSIS — K8020 Calculus of gallbladder without cholecystitis without obstruction: Secondary | ICD-10-CM

## 2011-09-26 HISTORY — PX: CHOLECYSTECTOMY: SHX55

## 2011-09-26 SURGERY — LAPAROSCOPIC CHOLECYSTECTOMY WITH INTRAOPERATIVE CHOLANGIOGRAM
Anesthesia: General | Site: Abdomen | Wound class: Clean Contaminated

## 2011-09-26 MED ORDER — LISINOPRIL-HYDROCHLOROTHIAZIDE 10-12.5 MG PO TABS: 1.0000 | ORAL_TABLET | Freq: Every day | ORAL | Status: DC

## 2011-09-26 MED ORDER — DROPERIDOL 2.5 MG/ML IJ SOLN
0.6250 mg | INTRAMUSCULAR | Status: DC | PRN
  Administered 2011-09-26: 0.625 mg via INTRAVENOUS

## 2011-09-26 MED ORDER — PROMETHAZINE HCL 25 MG/ML IJ SOLN: 12.5000 mg | Freq: Four times a day (QID) | INTRAMUSCULAR | Status: DC | PRN

## 2011-09-26 MED ORDER — AMPHETAMINE-DEXTROAMPHET ER 30 MG PO CP24: 30.0000 mg | ORAL_CAPSULE | ORAL | Status: DC

## 2011-09-26 MED ORDER — PROPOFOL 10 MG/ML IV EMUL
INTRAVENOUS | Status: DC | PRN
  Administered 2011-09-26: 200 mg via INTRAVENOUS

## 2011-09-26 MED ORDER — BUPIVACAINE HCL (PF) 0.25 % IJ SOLN
INTRAMUSCULAR | Status: DC | PRN
  Administered 2011-09-26: 22 mL

## 2011-09-26 MED ORDER — LACTATED RINGERS IV SOLN
INTRAVENOUS | Status: DC | PRN
  Administered 2011-09-26 (×2): via INTRAVENOUS

## 2011-09-26 MED ORDER — LIDOCAINE HCL (CARDIAC) 20 MG/ML IV SOLN
INTRAVENOUS | Status: DC | PRN
  Administered 2011-09-26: 100 mg via INTRAVENOUS

## 2011-09-26 MED ORDER — BUPROPION HCL ER (XL) 300 MG PO TB24
300.0000 mg | ORAL_TABLET | ORAL | Status: DC
  Filled 2011-09-26 (×2): qty 1

## 2011-09-26 MED ORDER — DROPERIDOL 2.5 MG/ML IJ SOLN
INTRAMUSCULAR | Status: AC
  Filled 2011-09-26: qty 2

## 2011-09-26 MED ORDER — OXYCODONE-ACETAMINOPHEN 5-325 MG PO TABS
1.0000 | ORAL_TABLET | ORAL | Status: DC | PRN
  Administered 2011-09-26: 1 via ORAL
  Administered 2011-09-27 (×2): 2 via ORAL
  Filled 2011-09-26 (×2): qty 2
  Filled 2011-09-26: qty 1

## 2011-09-26 MED ORDER — SODIUM CHLORIDE 0.9 % IR SOLN
Status: DC | PRN
  Administered 2011-09-26: 1000 mL

## 2011-09-26 MED ORDER — MIDAZOLAM HCL 5 MG/5ML IJ SOLN
INTRAMUSCULAR | Status: DC | PRN
  Administered 2011-09-26: 2 mg via INTRAVENOUS

## 2011-09-26 MED ORDER — IOHEXOL 300 MG/ML  SOLN
INTRAMUSCULAR | Status: DC | PRN
  Administered 2011-09-26: 5 mL via INTRAVENOUS

## 2011-09-26 MED ORDER — HYDROMORPHONE HCL PF 1 MG/ML IJ SOLN
2.0000 mg | INTRAMUSCULAR | Status: DC | PRN
  Administered 2011-09-26: 1 mg via INTRAVENOUS
  Filled 2011-09-26: qty 1

## 2011-09-26 MED ORDER — FENTANYL CITRATE 0.05 MG/ML IJ SOLN
INTRAMUSCULAR | Status: DC | PRN
  Administered 2011-09-26 (×2): 50 ug via INTRAVENOUS
  Administered 2011-09-26 (×2): 100 ug via INTRAVENOUS
  Administered 2011-09-26 (×2): 50 ug via INTRAVENOUS

## 2011-09-26 MED ORDER — MIDAZOLAM HCL 2 MG/2ML IJ SOLN
INTRAMUSCULAR | Status: AC
  Administered 2011-09-26: 1 mg
  Filled 2011-09-26: qty 2

## 2011-09-26 MED ORDER — ONDANSETRON HCL 4 MG/2ML IJ SOLN
INTRAMUSCULAR | Status: DC | PRN
  Administered 2011-09-26 (×2): 4 mg via INTRAVENOUS

## 2011-09-26 MED ORDER — SUMATRIPTAN SUCCINATE 25 MG PO TABS
25.0000 mg | ORAL_TABLET | Freq: Once | ORAL | Status: AC
  Administered 2011-09-26: 25 mg via ORAL
  Filled 2011-09-26: qty 1

## 2011-09-26 MED ORDER — ROCURONIUM BROMIDE 100 MG/10ML IV SOLN
INTRAVENOUS | Status: DC | PRN
  Administered 2011-09-26: 50 mg via INTRAVENOUS

## 2011-09-26 MED ORDER — HYDROMORPHONE HCL PF 1 MG/ML IJ SOLN
INTRAMUSCULAR | Status: AC
  Filled 2011-09-26: qty 1

## 2011-09-26 MED ORDER — DEXAMETHASONE SODIUM PHOSPHATE 4 MG/ML IJ SOLN
INTRAMUSCULAR | Status: DC | PRN
  Administered 2011-09-26: 4 mg via INTRAVENOUS

## 2011-09-26 MED ORDER — HYDROMORPHONE HCL PF 2 MG/ML IJ SOLN: 2.0000 mg | INTRAMUSCULAR | Status: DC | PRN

## 2011-09-26 MED ORDER — ONDANSETRON HCL 4 MG/2ML IJ SOLN: 4.0000 mg | Freq: Four times a day (QID) | INTRAMUSCULAR | Status: DC | PRN

## 2011-09-26 MED ORDER — 0.9 % SODIUM CHLORIDE (POUR BTL) OPTIME
TOPICAL | Status: DC | PRN
  Administered 2011-09-26: 1000 mL

## 2011-09-26 MED ORDER — HEMOSTATIC AGENTS (NO CHARGE) OPTIME
TOPICAL | Status: DC | PRN
  Administered 2011-09-26: 1

## 2011-09-26 MED ORDER — NEOSTIGMINE METHYLSULFATE 1 MG/ML IJ SOLN
INTRAMUSCULAR | Status: DC | PRN
  Administered 2011-09-26: 5 mg via INTRAVENOUS

## 2011-09-26 MED ORDER — HYDROMORPHONE HCL PF 1 MG/ML IJ SOLN
0.2500 mg | INTRAMUSCULAR | Status: DC | PRN
  Administered 2011-09-26 (×6): 0.5 mg via INTRAVENOUS

## 2011-09-26 MED ORDER — HYDROCHLOROTHIAZIDE 12.5 MG PO CAPS
12.5000 mg | ORAL_CAPSULE | Freq: Every day | ORAL | Status: DC
  Filled 2011-09-26: qty 1

## 2011-09-26 MED ORDER — ALPRAZOLAM 0.5 MG PO TABS
0.5000 mg | ORAL_TABLET | Freq: Every evening | ORAL | Status: DC | PRN
  Administered 2011-09-26: 0.5 mg via ORAL
  Filled 2011-09-26: qty 1

## 2011-09-26 MED ORDER — LISINOPRIL 10 MG PO TABS
10.0000 mg | ORAL_TABLET | Freq: Every day | ORAL | Status: DC
  Filled 2011-09-26: qty 1

## 2011-09-26 MED ORDER — GLYCOPYRROLATE 0.2 MG/ML IJ SOLN
INTRAMUSCULAR | Status: DC | PRN
  Administered 2011-09-26: 1 mg via INTRAVENOUS

## 2011-09-26 MED ORDER — KCL IN DEXTROSE-NACL 20-5-0.45 MEQ/L-%-% IV SOLN
INTRAVENOUS | Status: DC
  Administered 2011-09-26: 23:00:00 via INTRAVENOUS
  Filled 2011-09-26 (×3): qty 1000

## 2011-09-26 MED ORDER — OXYCODONE-ACETAMINOPHEN 5-325 MG PO TABS: 1.0000 | ORAL_TABLET | ORAL | Status: AC | PRN

## 2011-09-26 MED ORDER — ARIPIPRAZOLE 5 MG PO TABS
5.0000 mg | ORAL_TABLET | Freq: Every day | ORAL | Status: DC
  Filled 2011-09-26 (×2): qty 1

## 2011-09-26 MED ORDER — SCOPOLAMINE 1 MG/3DAYS TD PT72
MEDICATED_PATCH | TRANSDERMAL | Status: DC | PRN
  Administered 2011-09-26: 1.5 mg via TRANSDERMAL

## 2011-09-26 MED ORDER — ESTRADIOL 1 MG PO TABS
1.0000 mg | ORAL_TABLET | Freq: Every day | ORAL | Status: DC
  Filled 2011-09-26: qty 1

## 2011-09-26 MED ORDER — LEVOTHYROXINE SODIUM 50 MCG PO TABS
50.0000 ug | ORAL_TABLET | Freq: Every day | ORAL | Status: DC
  Administered 2011-09-26: 50 ug via ORAL
  Filled 2011-09-26 (×2): qty 1

## 2011-09-26 SURGICAL SUPPLY — 51 items
ADH SKN CLS APL DERMABOND .7 (GAUZE/BANDAGES/DRESSINGS) ×1
ADH SKN CLS LQ APL DERMABOND (GAUZE/BANDAGES/DRESSINGS) ×1
APPLIER CLIP ROT 10 11.4 M/L (STAPLE) ×2
APR CLP MED LRG 11.4X10 (STAPLE) ×1
BAG SPEC RTRVL LRG 6X4 10 (ENDOMECHANICALS) ×1
BLADE SURG ROTATE 9660 (MISCELLANEOUS) IMPLANT
CANISTER SUCTION 2500CC (MISCELLANEOUS) ×2 IMPLANT
CHLORAPREP W/TINT 26ML (MISCELLANEOUS) ×2 IMPLANT
CLIP APPLIE ROT 10 11.4 M/L (STAPLE) ×1 IMPLANT
CLOTH BEACON ORANGE TIMEOUT ST (SAFETY) ×2 IMPLANT
COVER MAYO STAND STRL (DRAPES) ×1 IMPLANT
COVER SURGICAL LIGHT HANDLE (MISCELLANEOUS) ×2 IMPLANT
DECANTER SPIKE VIAL GLASS SM (MISCELLANEOUS) ×2 IMPLANT
DERMABOND ADHESIVE PROPEN (GAUZE/BANDAGES/DRESSINGS) ×1
DERMABOND ADVANCED (GAUZE/BANDAGES/DRESSINGS) ×1
DERMABOND ADVANCED .7 DNX12 (GAUZE/BANDAGES/DRESSINGS) ×1 IMPLANT
DERMABOND ADVANCED .7 DNX6 (GAUZE/BANDAGES/DRESSINGS) IMPLANT
DRAPE C-ARM 42X72 X-RAY (DRAPES) ×1 IMPLANT
DRAPE UTILITY 15X26 W/TAPE STR (DRAPE) ×5 IMPLANT
ELECT REM PT RETURN 9FT ADLT (ELECTROSURGICAL) ×2
ELECTRODE REM PT RTRN 9FT ADLT (ELECTROSURGICAL) ×1 IMPLANT
FILTER SMOKE EVAC LAPAROSHD (FILTER) ×1 IMPLANT
GLOVE BIO SURGEON STRL SZ 6.5 (GLOVE) ×1 IMPLANT
GLOVE BIO SURGEON STRL SZ7.5 (GLOVE) ×2 IMPLANT
GLOVE BIOGEL PI IND STRL 6.5 (GLOVE) IMPLANT
GLOVE BIOGEL PI IND STRL 7.5 (GLOVE) IMPLANT
GLOVE BIOGEL PI INDICATOR 6.5 (GLOVE) ×2
GLOVE BIOGEL PI INDICATOR 7.5 (GLOVE) ×1
GLOVE ECLIPSE 6.5 STRL STRAW (GLOVE) ×1 IMPLANT
GLOVE EUDERMIC 7 POWDERFREE (GLOVE) ×2 IMPLANT
GOWN PREVENTION PLUS XLARGE (GOWN DISPOSABLE) ×2 IMPLANT
GOWN STRL NON-REIN LRG LVL3 (GOWN DISPOSABLE) ×6 IMPLANT
HEMOSTAT SNOW SURGICEL 2X4 (HEMOSTASIS) ×1 IMPLANT
KIT BASIN OR (CUSTOM PROCEDURE TRAY) ×2 IMPLANT
KIT ROOM TURNOVER OR (KITS) ×2 IMPLANT
NS IRRIG 1000ML POUR BTL (IV SOLUTION) ×2 IMPLANT
PAD ARMBOARD 7.5X6 YLW CONV (MISCELLANEOUS) ×4 IMPLANT
POUCH SPECIMEN RETRIEVAL 10MM (ENDOMECHANICALS) ×2 IMPLANT
SCISSORS LAP 5X35 DISP (ENDOMECHANICALS) ×2 IMPLANT
SET CHOLANGIOGRAPH 5 50 .035 (SET/KITS/TRAYS/PACK) ×1 IMPLANT
SET IRRIG TUBING LAPAROSCOPIC (IRRIGATION / IRRIGATOR) ×2 IMPLANT
SLEEVE Z-THREAD 5X100MM (TROCAR) ×2 IMPLANT
SPECIMEN JAR SMALL (MISCELLANEOUS) ×2 IMPLANT
SUT MNCRL AB 4-0 PS2 18 (SUTURE) ×2 IMPLANT
TOWEL OR 17X24 6PK STRL BLUE (TOWEL DISPOSABLE) ×2 IMPLANT
TOWEL OR 17X26 10 PK STRL BLUE (TOWEL DISPOSABLE) ×2 IMPLANT
TRAY LAPAROSCOPIC (CUSTOM PROCEDURE TRAY) ×2 IMPLANT
TROCAR XCEL BLUNT TIP 100MML (ENDOMECHANICALS) ×2 IMPLANT
TROCAR Z-THREAD FIOS 11X100 BL (TROCAR) ×2 IMPLANT
TROCAR Z-THREAD FIOS 5X100MM (TROCAR) ×2 IMPLANT
WATER STERILE IRR 1000ML POUR (IV SOLUTION) IMPLANT

## 2011-09-26 NOTE — Progress Notes (Signed)
Pt. Trying to get out of bed. States she is "having a panic attack". Anesthesia aware. Versed given as ordered. Pt. Tol. Well and is resting quietly.

## 2011-09-26 NOTE — Progress Notes (Signed)
Pt. Is either in severe pain, or sleeping soundly. Dr. Jamey Ripa aware. Orders to admit for observation until pt. Meets criteria for d/c home.

## 2011-09-26 NOTE — Preoperative (Signed)
Beta Blockers   Reason not to administer Beta Blockers:Not Applicable 

## 2011-09-26 NOTE — Transfer of Care (Signed)
Immediate Anesthesia Transfer of Care Note  Patient: Tiffany Montes  Procedure(s) Performed:  LAPAROSCOPIC CHOLECYSTECTOMY WITH INTRAOPERATIVE CHOLANGIOGRAM  Patient Location: PACU  Anesthesia Type: General  Level of Consciousness: awake and oriented  Airway & Oxygen Therapy: Patient Spontanous Breathing and Patient connected to nasal cannula oxygen  Post-op Assessment: Report given to PACU RN  Post vital signs: Reviewed and stable  Complications: No apparent anesthesia complications

## 2011-09-26 NOTE — Anesthesia Preprocedure Evaluation (Addendum)
Anesthesia Evaluation  Patient identified by MRN, date of birth, ID band Patient awake    Reviewed: Allergy & Precautions, H&P , NPO status , Patient's Chart, lab work & pertinent test results  History of Anesthesia Complications (+) PONV  Airway Mallampati: II TM Distance: >3 FB Neck ROM: Full    Dental  (+) Dental Advisory Given   Pulmonary neg pulmonary ROS,  clear to auscultation  Pulmonary exam normal       Cardiovascular hypertension, Pt. on medications Regular Normal- Systolic murmurs    Neuro/Psych  Headaches, Anxiety Depression    GI/Hepatic Neg liver ROS,   Endo/Other  Hypothyroidism   Renal/GU negative Renal ROS     Musculoskeletal   Abdominal   Peds  Hematology   Anesthesia Other Findings   Reproductive/Obstetrics                         Anesthesia Physical Anesthesia Plan  ASA: II  Anesthesia Plan: General   Post-op Pain Management:    Induction: Intravenous  Airway Management Planned: Oral ETT  Additional Equipment:   Intra-op Plan:   Post-operative Plan: Extubation in OR  Informed Consent:   Dental advisory given  Plan Discussed with: CRNA, Anesthesiologist and Surgeon  Anesthesia Plan Comments:         Anesthesia Quick Evaluation

## 2011-09-26 NOTE — Interval H&P Note (Signed)
History and Physical Interval Note:  09/26/2011 7:30 AM  Tiffany Montes  has presented today for surgery, with the diagnosis of Gallstones  The various methods of treatment have been discussed with the patient. After consideration of risks, benefits and other options for treatment, the patient has consented to  Procedure(s): LAPAROSCOPIC CHOLECYSTECTOMY WITH INTRAOPERATIVE CHOLANGIOGRAM as a surgical intervention .  The patients' history has been reviewed, patient examined, no change in status, stable for surgery.  I have reviewed the patients' chart and labs.  Questions were answered to the patient's satisfaction.     Kileen Lange J

## 2011-09-26 NOTE — Anesthesia Postprocedure Evaluation (Signed)
Anesthesia Post Note  Patient: Tiffany Montes  Procedure(s) Performed:  LAPAROSCOPIC CHOLECYSTECTOMY WITH INTRAOPERATIVE CHOLANGIOGRAM  Anesthesia type: General  Patient location: PACU  Post pain: Pain level controlled  Post assessment: Patient's Cardiovascular Status Stable  Last Vitals:  Filed Vitals:   09/26/11 0900  BP:   Pulse:   Temp: 36.5 C  Resp:     Post vital signs: Reviewed and stable  Level of consciousness: sedated  Complications: No apparent anesthesia complications

## 2011-09-26 NOTE — Op Note (Signed)
Tiffany Montes 12/17/1955 [id number] 09/05/2011  Preoperative diagnosis: chronic calculus cholecystitis  Postoperative diagnosis: chronic calculus cholecystitis  Procedure:laparoscopic cholecystectomy with operative cholangiogram  Surgeon: Currie Paris, MD, FACS  Assistant surgeon: Dr. Consuello Bossier   Anesthesia:General  Clinical History and Indications: This patient has known gallstones and comes in today for cholecystectomy.  Description of procedure: The patient was seen in the preoperative area. I reviewed the plans for the procedure with her as well as the risks and complications. She had no further questions.  The patient was taken to the operating room. After satisfactory general endotracheal anesthesia had been obtained the abdomen was prepped and draped. A time out was done.  0.25% plain Marcaine was used at all incisions. I made an umbilical incision, identified the fascia and opened that, and entered the peritoneal cavity under direct vision. A 0 Vicryl pursestring suture was placed and the Hasson cannula was introduced under direct vision and secured with the pursestring. The abdomen was inflated to 15 cm.  The camera was placed and there were no gross abnormalities. The patient was then placed in reverse Trendelenburg and tilted to the left. A 10/11 trocar was placed in the epigastrium and two 5 mm trochars placed laterally all under direct vision.  There were multiple adhesions of omentum to the gallbladder. These were all taken down with cautery. The gallbladder was markedly dilated but not acutely inflamed. I was able to dissected out the cystic duct. The artery was closely associated with it and was carefully separated from it. A second branch was also identified. Made a large window to be sure anatomy was correct. I put one clip on the cystic duct at the junction with the gallbladder and clips on both branches of the cystic artery.  An intraoperative  cholangiogram was then performed. A Cook catheter was introduced percutaneously and placed in the cystic duct. The cholangiogram showed good filling of the common duct and hepatic radicals and free flow into the duodenum. No abnormalities were noted.  The catheter was removed and 3 clips placed on the stay side of the cystic duct. The duct was then divided.  Additional clips are placed on the cystic artery and it was divided. The gallbladder was then removed from below to above the coagulation current of the cautery. It was then placed in a bag to be retrieved later.  The abdomen was irrigated and a check for hemostasis along the bed of the gallbladder made. Once everything appeared to be dry. Because the gallbladder was somewhat intrahepatic it put some snow in to cover the raw surface of the gallbladder bed. Then we were able to move the camera to the epigastric port and removed the gallbladder through the umbilical port.  The abdomen was reinsufflated and a final check for hemostasis made. There is no evidence of bleeding or bile leakage. The lateral ports were removed under direct vision and there was no bleeding. The umbilical site was closed with a pursestring, watching with the camera in the epigastric port. The abdomen was then deflated through the epigastric port and that was removed. Skin was closed with 4-0 Monocryl subcuticular and Dermabond.  The patient tolerated the procedure well. There were no operative complications. EBL was minimal. All counts were correct.  Currie Paris, MD, FACS 09/26/2011 8:52 AM

## 2011-09-26 NOTE — Progress Notes (Signed)
Pt. Briefly up to recliner.  Pt. Is still uncomfortable and unable to tol. Chair.  Back to stretcher.

## 2011-09-26 NOTE — H&P (View-Only) (Signed)
NAME: Tiffany Montes                                                                                      DOB: 12/06/1955 DATE: 09/05/2011               MRN: [id number]   CC:  Chief Complaint  Patient presents with  . Other    new pt- eval GB with stones    HPI:  Tiffany Montes is a 55 y.o.  female who was referred  by Dr. Burchettefor evaluation of Gallsotones  PMH:  has a past medical history of Depression; Attention deficit disorder; Thyroid disease; Hypertension; Hyperlipidemia; and Gallstones (09/05/2011).  PSH:   has past surgical history that includes Abdominal hysterectomy (2003).  ALLERGIES:   Allergies  Allergen Reactions  . Morphine Sulfate     REACTION: GI upset    MEDICATIONS: Current outpatient prescriptions:ALPRAZolam (XANAX) 0.5 MG tablet, 1/2 to 1 tab prn for anxiety, Disp: 10 tablet, Rfl: 0;  amphetamine-dextroamphetamine (ADDERALL XR) 30 MG 24 hr capsule, Take 1 capsule (30 mg total) by mouth every morning., Disp: 30 capsule, Rfl: 0;  amphetamine-dextroamphetamine (ADDERALL XR) 30 MG 24 hr capsule, Take 1 capsule (30 mg total) by mouth every morning. May fill in one month, Disp: 30 capsule, Rfl: 0 amphetamine-dextroamphetamine (ADDERALL XR) 30 MG 24 hr capsule, Take 1 capsule (30 mg total) by mouth every morning. May fill in two months, Disp: 30 capsule, Rfl: 0;  ARIPiprazole (ABILIFY) 5 MG tablet, Take 1 tablet (5 mg total) by mouth daily., Disp: 90 tablet, Rfl: 3;  buPROPion (WELLBUTRIN XL) 300 MG 24 hr tablet, Take 1 tablet (300 mg total) by mouth every morning., Disp: 30 tablet, Rfl: 0 estradiol (ESTRACE) 1 MG tablet, Take 1 mg by mouth daily.  , Disp: , Rfl: ;  fluticasone (FLONASE) 50 MCG/ACT nasal spray, USE 2 SPRAYS IN EACH NOSTRIL DAILY, Disp: 16 g, Rfl: 6;  levothyroxine (LEVOTHROID) 50 MCG tablet, Take 1 tablet (50 mcg total) by mouth daily., Disp: 90 tablet, Rfl: 3;  lisinopril-hydrochlorothiazide (PRINZIDE,ZESTORETIC) 10-12.5 MG per tablet, Take 1 tablet  by mouth daily., Disp: 90 tablet, Rfl: 3 naproxen sodium (ANAPROX) 220 MG tablet, Take 440 mg by mouth 2 (two) times daily as needed.  , Disp: , Rfl: ;  SUMAtriptan (IMITREX) 100 MG tablet, One tab by mouth as needed for migraine, may repeat once in 24 hours, Disp: 30 tablet, Rfl: 1  ROS: She has filled out our 12 point review of systems and it is negative except for abdominal pain   EXAM:   GENERAL:  The patient is alert, oriented, and generally healthy-appearing, NAD. Mood and affect are normal.  HEENT:  The head is normocephalic, the eyes nonicteric, the pupils were round regular and equal. EOMs are normal. Pharynx normal. Dentition good.  NECK:  The neck is supple and there are no masses or thyromegaly.  LUNGS:  Normal respirations and clear to auscultation.  HEART:  Regular rhythm, with no murmurs rubs or gallops. Pulses are intact carotid dorsalis pedis and posterior tibial. No significant varicosities are noted.  ABDOMEN:  Soft, flat, and nontender. No masses   or organomegaly is noted. No hernias are noted. Bowel sounds are normal.    EXTREMITIES:  Good range of motion, no edema.   DATA REVIEWED:  Her Epic chart is reviewed and the ultrasound noted  IMPRESSION:  Symptomatic gallstones  PLAN:   Cholecystectomy. We will try to schedule at her convenience. I have discussed the indications for laparoscopic cholecystectomy with her and provided educational material. We have discussed the risks of surgery, including general risks such as bleeding, infection, lung and heart issues etc. We have also discussed the potential for injuries to other organs, bile duct leaks, and other unexpected events. We have also talked about the fact that this may need to be converted to open under certain circumstances. We discussed the typical post op recovery and the fact that there is a good likelihood of improvement in symptoms and return to normal activity.  She understands this and wishes  to proceed to schedule surgery. I believe all of .his questions have been answered.     Tera Pellicane J 09/05/2011 3:07 PM          

## 2011-09-27 ENCOUNTER — Encounter (HOSPITAL_COMMUNITY): Payer: Self-pay | Admitting: Surgery

## 2011-09-27 NOTE — Discharge Summary (Signed)
Physician Discharge Summary  Patient ID: Tiffany Montes MRN: [id number] DOB/AGE: 1956-06-26 55 y.o.  Admit date: 09/26/2011 Discharge date: 09/27/2011  Admission Diagnoses:  Discharge Diagnoses:  Active Problems:  * No active hospital problems. *    Discharged Condition: good  Hospital Course: Admitted post op and did well overnight and able to be discharged this morning  Consults: none  Significant Diagnostic Studies: radiology: NL IOC  Treatments: surgery: Lap chole with IOC  Discharge Exam: Blood pressure 123/78, pulse 87, temperature 97.4 F (36.3 C), temperature source Oral, resp. rate 18, SpO2 96.00%. General appearance: alert and no distress GI: soft, non-tender; bowel sounds normal; no masses,  no organomegaly  Disposition:    Medication List  As of 09/27/2011  7:46 AM   START taking these medications         oxyCODONE-acetaminophen 5-325 MG per tablet   Commonly known as: PERCOCET   Take 1 tablet by mouth every 4 (four) hours as needed for pain.         CONTINUE taking these medications         ALPRAZolam 0.5 MG tablet   Commonly known as: XANAX      amphetamine-dextroamphetamine 30 MG 24 hr capsule   Commonly known as: ADDERALL XR   Take 1 capsule (30 mg total) by mouth every morning.      ARIPiprazole 5 MG tablet   Commonly known as: ABILIFY   Take 1 tablet (5 mg total) by mouth daily.      buPROPion 300 MG 24 hr tablet   Commonly known as: WELLBUTRIN XL   Take 1 tablet (300 mg total) by mouth every morning.      estradiol 1 MG tablet   Commonly known as: ESTRACE      fluticasone 50 MCG/ACT nasal spray   Commonly known as: FLONASE   USE 2 SPRAYS IN EACH NOSTRIL DAILY      levothyroxine 50 MCG tablet   Commonly known as: SYNTHROID, LEVOTHROID   Take 1 tablet (50 mcg total) by mouth daily.      lisinopril-hydrochlorothiazide 10-12.5 MG per tablet   Commonly known as: PRINZIDE,ZESTORETIC   Take 1 tablet by mouth daily.     multivitamins ther. w/minerals Tabs      naproxen sodium 220 MG tablet   Commonly known as: ANAPROX      SUMAtriptan 100 MG tablet   Commonly known as: IMITREX   One tab by mouth as needed for migraine, may repeat once in 24 hours          Where to get your medications    These are the prescriptions that you need to pick up.   You may get these medications from any pharmacy.         oxyCODONE-acetaminophen 5-325 MG per tablet           Follow-up Information    Follow up with Currie Paris, MD. Make an appointment in 3 weeks.   Contact information:   [facility] [address] [place] [address] [phone]          Signed: Currie Paris 09/27/2011, 7:46 AM

## 2011-09-27 NOTE — Progress Notes (Signed)
1 Day Post-Op  Subjective: She stayed overnight as was having too much post op pain to go home - mainly R shoulder pain. Feels better this am and able to go home  Objective: Vital signs in last 24 hours: Temp:  [97.4 F (36.3 C)-98.1 F (36.7 C)] 97.4 F (36.3 C) (12/20 2206) Pulse Rate:  [73-95] 87  (12/20 2206) Resp:  [14-18] 18  (12/20 2206) BP: (123-151)/(78-93) 123/78 mmHg (12/20 2206) SpO2:  [96 %-100 %] 96 % (12/20 2206) Last BM Date: 09/25/11  Intake/Output from previous day: 12/20 0701 - 12/21 0700 In: 2692 [P.O.:240; I.V.:2452] Out: 160 [Urine:150; Blood:10] Intake/Output this shift:    General appearance: alert and no distress GI: soft, non-tender; bowel sounds normal; no masses,  no organomegaly  Lab Results:  No results found for this basename: WBC:2,HGB:2,HCT:2,PLT:2 in the last 72 hours BMET No results found for this basename: NA:2,K:2,CL:2,CO2:2,GLUCOSE:2,BUN:2,CREATININE:2,CALCIUM:2 in the last 72 hours PT/INR No results found for this basename: LABPROT:2,INR:2 in the last 72 hours ABG No results found for this basename: PHART:2,PCO2:2,PO2:2,HCO3:2 in the last 72 hours  Studies/Results: Dg Cholangiogram Operative  09/26/2011  *RADIOLOGY REPORT*  Clinical Data:   Microscopic cholecystectomy  INTRAOPERATIVE CHOLANGIOGRAM  Technique:  Cholangiographic images from the C-arm fluoroscopic device were submitted for interpretation post-operatively.  Please see the procedural report for the amount of contrast and the fluoroscopy time utilized.  Comparison:  Ultrasound 08/20/2011  Findings:  34 sequential fluoroscopic images are provided.  Initial image demonstrates catheterization of the cystic duct.  Injection of contrast fills the common hepatic duct and common bile duct.  No evidence of filling defect or obstruction.  Contrast flows into the duodenum.  IMPRESSION: No evidence of filling defect within the common bile duct . No obstruction.  No evidence of biliary leak.   Original Report Authenticated By: Genevive Bi, M.D.    Anti-infectives: Anti-infectives     Start     Dose/Rate Route Frequency Ordered Stop   09/25/11 1515   ceFAZolin (ANCEF) IVPB 2 g/50 mL premix        2 g 100 mL/hr over 30 Minutes Intravenous 60 min pre-op 09/25/11 1506 09/26/11 0750          Assessment/Plan: s/p Procedure(s): LAPAROSCOPIC CHOLECYSTECTOMY WITH INTRAOPERATIVE CHOLANGIOGRAM Discharge  LOS: 1 day    Keirra Zeimet J 09/27/2011

## 2011-09-27 NOTE — Progress Notes (Signed)
UR of chart completed-retroactive.

## 2011-10-04 ENCOUNTER — Telehealth: Payer: Self-pay | Admitting: Family Medicine

## 2011-10-04 DIAGNOSIS — F988 Other specified behavioral and emotional disorders with onset usually occurring in childhood and adolescence: Secondary | ICD-10-CM

## 2011-10-04 NOTE — Telephone Encounter (Signed)
Pt requesting refill on amphetamine-dextroamphetamine (ADDERALL XR) 30 MG 24 hr capsule ° °

## 2011-10-04 NOTE — Telephone Encounter (Signed)
VM left for pt.  This med was last filled on 07/01/11, #30 with 0 refills.  I informed pt Dr Caryl Never is out of the office until 12/31.  Can this wait until then?  If not, please call our office back today and I will see if one of the other providers will fill.

## 2011-10-09 MED ORDER — AMPHETAMINE-DEXTROAMPHET ER 30 MG PO CP24: 30.0000 mg | ORAL_CAPSULE | ORAL | Status: DC

## 2011-10-09 NOTE — Telephone Encounter (Signed)
Please advise on refill request

## 2011-10-09 NOTE — Telephone Encounter (Signed)
Pt informed Rx ready for pick-up tomorrow

## 2011-10-09 NOTE — Telephone Encounter (Signed)
Refill okay for 3 months 

## 2011-10-09 NOTE — Telephone Encounter (Signed)
Pt called regarding refill. Pt wanted to know status. Please contact

## 2011-10-14 ENCOUNTER — Ambulatory Visit (INDEPENDENT_AMBULATORY_CARE_PROVIDER_SITE_OTHER): Payer: BC Managed Care – PPO | Admitting: Family

## 2011-10-14 ENCOUNTER — Telehealth: Payer: Self-pay

## 2011-10-14 ENCOUNTER — Encounter: Payer: Self-pay | Admitting: Family

## 2011-10-14 VITALS — BP 124/80 | Temp 98.1°F | Wt 173.0 lb

## 2011-10-14 DIAGNOSIS — F32A Depression, unspecified: Secondary | ICD-10-CM

## 2011-10-14 DIAGNOSIS — E039 Hypothyroidism, unspecified: Secondary | ICD-10-CM

## 2011-10-14 DIAGNOSIS — F329 Major depressive disorder, single episode, unspecified: Secondary | ICD-10-CM

## 2011-10-14 DIAGNOSIS — F341 Dysthymic disorder: Secondary | ICD-10-CM

## 2011-10-14 MED ORDER — VENLAFAXINE HCL ER 75 MG PO CP24: 75.0000 mg | ORAL_CAPSULE | Freq: Every day | ORAL | Status: DC

## 2011-10-14 MED ORDER — ALPRAZOLAM 0.5 MG PO TABS: 0.5000 mg | ORAL_TABLET | Freq: Every evening | ORAL | Status: DC | PRN

## 2011-10-14 NOTE — Telephone Encounter (Signed)
Pt left message with Aram Beecham questioning if she should continue to Abilify along with the Effexor.  Advised pt that according to [facility] note, she is to d/c the Wellbutrin not the Abilify. Pt verbalized understanding and is due for a f/u in 2 weeks to re-evaluate progress

## 2011-10-14 NOTE — Progress Notes (Signed)
Subjective:    Patient ID: Tiffany Montes, female    DOB: Jan 13, 1956, 56 y.o.   MRN: [id number]  HPI 24-year-old white female, nonsmoker patient Dr. Caryl Never is in today with complaints of an increase in her anxiety and panic attacks. She reports having difficulty controlling her anxiety over the past year switching from Effexor to Wellbutrin. She will like to go back on Effexor. She also has a history of hypothyroidism, but she fills that she is well-controlled. Her last labs were normal. She denies any thoughts of hopelessness, hopelessness, thoughts of death, were dying. She has a history of ADD and is on an amphetamine.   Review of Systems  Constitutional: Negative.   Respiratory: Negative.   Cardiovascular: Negative.   Gastrointestinal: Negative.   Hematological: Negative.   Psychiatric/Behavioral: Positive for agitation. The patient is nervous/anxious.    Past Medical History  Diagnosis Date  . Thyroid disease     hypothyroid  . Hypertension   . Hyperlipidemia   . Gallstones 09/05/2011  . PONV (postoperative nausea and vomiting)   . Depression   . Attention deficit disorder   . Migraines   . Anxiety     History   Social History  . Marital Status: Single    Spouse Name: N/A    Number of Children: N/A  . Years of Education: N/A   Occupational History  . Not on file.   Social History Main Topics  . Smoking status: Never Smoker   . Smokeless tobacco: Never Used  . Alcohol Use: Yes     1-2 a week and reports not every week  . Drug Use: No  . Sexually Active: Not on file   Other Topics Concern  . Not on file   Social History Narrative  . No narrative on file    Past Surgical History  Procedure Date  . Abdominal hysterectomy 2003  . Cystic hygroma excision   . Cholecystectomy 09/26/2011    Procedure: LAPAROSCOPIC CHOLECYSTECTOMY WITH INTRAOPERATIVE CHOLANGIOGRAM;  Surgeon: Currie Paris, MD;  Location: [facility] OR;  Service: General;  Laterality: N/A;     Family History  Problem Relation Age of Onset  . Diabetes Father   . Cancer Father     lung   . Heart disease Father     Allergies  Allergen Reactions  . Morphine Sulfate Nausea And Vomiting and Rash    REACTION: GI upset    Current Outpatient Prescriptions on File Prior to Visit  Medication Sig Dispense Refill  . amphetamine-dextroamphetamine (ADDERALL XR) 30 MG 24 hr capsule Take 1 capsule (30 mg total) by mouth every morning.  30 capsule  0  . amphetamine-dextroamphetamine (ADDERALL XR) 30 MG 24 hr capsule Take 1 capsule (30 mg total) by mouth every morning.  30 capsule  0  . amphetamine-dextroamphetamine (ADDERALL XR) 30 MG 24 hr capsule Take 1 capsule (30 mg total) by mouth every morning.  30 capsule  0  . ARIPiprazole (ABILIFY) 5 MG tablet Take 1 tablet (5 mg total) by mouth daily.  90 tablet  3  . fluticasone (FLONASE) 50 MCG/ACT nasal spray USE 2 SPRAYS IN EACH NOSTRIL DAILY  16 g  6  . levothyroxine (LEVOTHROID) 50 MCG tablet Take 1 tablet (50 mcg total) by mouth daily.  90 tablet  3  . lisinopril-hydrochlorothiazide (PRINZIDE,ZESTORETIC) 10-12.5 MG per tablet Take 1 tablet by mouth daily.  90 tablet  3  . Multiple Vitamins-Minerals (MULTIVITAMINS THER. W/MINERALS) TABS Take 1 tablet by mouth daily.        Marland Kitchen  SUMAtriptan (IMITREX) 100 MG tablet One tab by mouth as needed for migraine, may repeat once in 24 hours  30 tablet  1  . estradiol (ESTRACE) 1 MG tablet Take 1 mg by mouth daily.        . naproxen sodium (ANAPROX) 220 MG tablet Take 440 mg by mouth 2 (two) times daily as needed.          BP 124/80  Temp(Src) 98.1 F (36.7 C) (Oral)  Wt 173 lb (78.472 kg)chart    Objective:   Physical Exam  Constitutional: She is oriented to person, place, and time. She appears well-developed and well-nourished.  Neck: Normal range of motion. Neck supple. No thyromegaly present.  Cardiovascular: Normal heart sounds.   Pulmonary/Chest: Effort normal and breath sounds normal.   Neurological: She is alert and oriented to person, place, and time.  Skin: Skin is warm and dry.  Psychiatric: She has a normal mood and affect.          Assessment & Plan:  Assessment: Anxiety and depression, hypothyroidism  Plan: Switch from Wellbutrin 300 mg a day 2 Effexor 75 mg daily. Refill Xanax #30x1 refill. Patient will recheck here in 2 weeks. She's advised to call the office if symptoms worsen or persist. Recheck as scheduled and when necessary. TSH was sent.

## 2011-10-14 NOTE — Patient Instructions (Signed)

## 2011-10-14 NOTE — Telephone Encounter (Signed)
Just saw Tiffany Montes and would like to know should she take her Abilify along with her Effexor? Please advise and return her call. Thanks.

## 2011-10-15 ENCOUNTER — Telehealth: Payer: Self-pay

## 2011-10-15 ENCOUNTER — Ambulatory Visit (INDEPENDENT_AMBULATORY_CARE_PROVIDER_SITE_OTHER): Payer: BC Managed Care – PPO | Admitting: Surgery

## 2011-10-15 ENCOUNTER — Encounter (INDEPENDENT_AMBULATORY_CARE_PROVIDER_SITE_OTHER): Payer: Self-pay | Admitting: General Surgery

## 2011-10-15 ENCOUNTER — Encounter (INDEPENDENT_AMBULATORY_CARE_PROVIDER_SITE_OTHER): Payer: Self-pay | Admitting: Surgery

## 2011-10-15 VITALS — BP 124/82 | HR 68 | Temp 97.9°F | Resp 16 | Ht 63.5 in | Wt 171.0 lb

## 2011-10-15 DIAGNOSIS — Z9889 Other specified postprocedural states: Secondary | ICD-10-CM

## 2011-10-15 MED ORDER — LEVOTHYROXINE SODIUM 75 MCG PO TABS: 75.0000 ug | ORAL_TABLET | Freq: Every day | ORAL | Status: DC

## 2011-10-15 NOTE — Progress Notes (Signed)
NAME: Tiffany Montes       DOB: 06/13/1956           DATE: 10/15/2011       UJW:[id number]   CC: Postop laparoscopic cholecystectomy  HPI:  This patient underwent a laparoscopic cholecystectomy with operative cholangiogram on 09/26/11. She is in for her first postoperative visit. She notes that her incisional pain has resolved. Her preoperative symptoms have improved. She is not  fevers, chills, or urinary symptoms. She is tolerating diet. She feels that she is progressing well and nearly back to normal.She has occasional post-prandial diarrhea. PE: General: The patient is alert and appears comfortable, NAD.  Abdomen: Soft and benign. The incisions are healing nicely. There are no apparent problems.  Data reviewed: IOC:  WNL Pathology:  Chronic cholecystitis and cholelithiasis  Impression:  The patient appears to be doing well, with improvement in her symptoms.  Plan:  She may resume full activity and regular diet. She  will followup with [place] on a p.r.n. basis. I did tell her that she may still have some foods that cause indigestion and ask her to call us if there are any questions, problems or concerns.If the diarrhea persists she is to call us

## 2011-10-15 NOTE — Telephone Encounter (Signed)
Message copied by Beverely Low on Tue Oct 15, 2011  4:28 PM ------      Message from: Adline Mango B      Created: Tue Oct 15, 2011  8:35 AM       Has she been consistently taking her thyroid med as prescribed? Thyroid under treated. Increase Synthroid to QD.

## 2011-10-15 NOTE — Telephone Encounter (Signed)
Pt aware of results and dose increase. Rx sent to pharmacy

## 2011-10-15 NOTE — Patient Instructions (Signed)
If your diarrhea persists more than 4-6 more weeks give me a call.Otherwise, We will see you again on an as needed basis. Please call the office at [phone] if you have any questions or concerns. Thank you for allowing [place] to take care of you.

## 2011-10-28 ENCOUNTER — Ambulatory Visit: Payer: BC Managed Care – PPO | Admitting: Family

## 2011-11-15 ENCOUNTER — Telehealth: Payer: Self-pay | Admitting: Family Medicine

## 2011-11-15 NOTE — Telephone Encounter (Signed)
Last filled 10-14-11, #30 with 1 refill I informed pt on her VM she was given 1 refill, please check with her pharmacy.  Sig: one tab at HS prn

## 2011-11-15 NOTE — Telephone Encounter (Signed)
Patient called stating she needs a refill on her xanax. Patient states she has 2 pills left.  Please assist and inform patient when available.

## 2011-11-20 ENCOUNTER — Ambulatory Visit (INDEPENDENT_AMBULATORY_CARE_PROVIDER_SITE_OTHER): Payer: BC Managed Care – PPO | Admitting: Family Medicine

## 2011-11-20 ENCOUNTER — Encounter: Payer: Self-pay | Admitting: Family Medicine

## 2011-11-20 VITALS — BP 110/80 | Temp 98.2°F | Wt 172.0 lb

## 2011-11-20 DIAGNOSIS — R300 Dysuria: Secondary | ICD-10-CM

## 2011-11-20 LAB — POCT URINALYSIS DIPSTICK
Bilirubin, UA: NEGATIVE
Glucose, UA: NEGATIVE
Ketones, UA: NEGATIVE
Nitrite, UA: NEGATIVE

## 2011-11-20 MED ORDER — NITROFURANTOIN MONOHYD MACRO 100 MG PO CAPS: 100.0000 mg | ORAL_CAPSULE | Freq: Two times a day (BID) | ORAL | Status: AC

## 2011-11-20 NOTE — Patient Instructions (Signed)

## 2011-11-20 NOTE — Progress Notes (Signed)
  Subjective:    Patient ID: Tiffany Montes, female    DOB: [birth year], 56 y.o.   MRN: [id number]  HPI  Possible UTI. 2 weeks intermittent symptoms of frequency and burning. Denies any vaginal discharge. Same sexual partner for one year. Denies back pain, nausea, vomiting, fever, or chills.  No prior history of UTI. No known antibiotic allergies   Review of Systems  Constitutional: Negative for fever, chills and appetite change.  Gastrointestinal: Negative for nausea, vomiting, abdominal pain, diarrhea and constipation.  Genitourinary: Positive for dysuria and frequency.  Musculoskeletal: Negative for back pain.  Neurological: Negative for dizziness.       Objective:   Physical Exam  Constitutional: She appears well-developed and well-nourished.  HENT:  Head: Normocephalic and atraumatic.  Neck: Neck supple. No thyromegaly present.  Cardiovascular: Normal rate, regular rhythm and normal heart sounds.   Pulmonary/Chest: Breath sounds normal.  Abdominal: Soft. Bowel sounds are normal. There is no tenderness.          Assessment & Plan:  Dysuria. Possible uncomplicated cystitis. Macrobid one twice a day for 5 days. Followup if symptoms persist

## 2011-11-25 LAB — HM PAP SMEAR: HM Pap smear: NEGATIVE

## 2011-12-13 ENCOUNTER — Other Ambulatory Visit: Payer: Self-pay | Admitting: Obstetrics & Gynecology

## 2011-12-13 DIAGNOSIS — Z1231 Encounter for screening mammogram for malignant neoplasm of breast: Secondary | ICD-10-CM

## 2011-12-16 ENCOUNTER — Other Ambulatory Visit: Payer: Self-pay

## 2011-12-16 MED ORDER — ALPRAZOLAM 0.5 MG PO TABS: 0.5000 mg | ORAL_TABLET | Freq: Every evening | ORAL | Status: DC | PRN

## 2011-12-18 ENCOUNTER — Ambulatory Visit
Admission: RE | Admit: 2011-12-18 | Discharge: 2011-12-18 | Disposition: A | Discharge status: Home / Self Care | Payer: BC Managed Care – PPO | Source: Ambulatory Visit | Attending: Obstetrics & Gynecology | Admitting: Obstetrics & Gynecology

## 2011-12-18 ENCOUNTER — Other Ambulatory Visit: Payer: Self-pay | Admitting: Family Medicine

## 2011-12-18 DIAGNOSIS — Z1231 Encounter for screening mammogram for malignant neoplasm of breast: Secondary | ICD-10-CM

## 2011-12-18 MED ORDER — LISINOPRIL-HYDROCHLOROTHIAZIDE 10-12.5 MG PO TABS: 1.0000 | ORAL_TABLET | Freq: Every day | ORAL | Status: DC

## 2011-12-18 NOTE — Telephone Encounter (Signed)
Pt needs new rx lisinopril-hctz 10-12.5mg  #90 with 3 refills call into [facility] [address] [phone]

## 2011-12-19 ENCOUNTER — Other Ambulatory Visit: Payer: Self-pay | Admitting: Obstetrics & Gynecology

## 2011-12-19 DIAGNOSIS — R928 Other abnormal and inconclusive findings on diagnostic imaging of breast: Secondary | ICD-10-CM

## 2011-12-25 ENCOUNTER — Ambulatory Visit
Admission: RE | Admit: 2011-12-25 | Discharge: 2011-12-25 | Disposition: A | Discharge status: Home / Self Care | Payer: BC Managed Care – PPO | Source: Ambulatory Visit | Attending: Obstetrics & Gynecology | Admitting: Obstetrics & Gynecology

## 2011-12-25 DIAGNOSIS — R928 Other abnormal and inconclusive findings on diagnostic imaging of breast: Secondary | ICD-10-CM

## 2012-01-01 ENCOUNTER — Telehealth: Payer: Self-pay | Admitting: Family Medicine

## 2012-01-01 MED ORDER — SUMATRIPTAN SUCCINATE 100 MG PO TABS: ORAL_TABLET | ORAL | Status: DC

## 2012-01-01 NOTE — Telephone Encounter (Signed)
Pts pharmacy has changed to [facility] on [address]. Pt is req 90 day script for SUMAtriptan (IMITREX) 100 MG tablet.

## 2012-01-07 ENCOUNTER — Other Ambulatory Visit: Payer: Self-pay | Admitting: Family Medicine

## 2012-01-14 ENCOUNTER — Telehealth: Payer: Self-pay | Admitting: Family Medicine

## 2012-01-14 DIAGNOSIS — F988 Other specified behavioral and emotional disorders with onset usually occurring in childhood and adolescence: Secondary | ICD-10-CM

## 2012-01-14 NOTE — Telephone Encounter (Signed)
Last filled 10-09-11 X 3

## 2012-01-14 NOTE — Telephone Encounter (Signed)
Pt called req 3 month script for amphetamine-dextroamphetamine (ADDERALL XR) 30 MG 24 hr capsule. Pt is completely out of med.

## 2012-01-14 NOTE — Telephone Encounter (Signed)
Refill OK

## 2012-01-15 MED ORDER — AMPHETAMINE-DEXTROAMPHET ER 30 MG PO CP24: 30.0000 mg | ORAL_CAPSULE | ORAL | Status: DC

## 2012-01-15 NOTE — Telephone Encounter (Signed)
Pt informed

## 2012-03-04 ENCOUNTER — Other Ambulatory Visit: Payer: Self-pay | Admitting: Family Medicine

## 2012-03-04 MED ORDER — BUPROPION HCL ER (XL) 300 MG PO TB24: 300.0000 mg | ORAL_TABLET | Freq: Every day | ORAL | Status: DC

## 2012-03-04 NOTE — Telephone Encounter (Signed)
Rf sent

## 2012-03-04 NOTE — Telephone Encounter (Signed)
Pt needs new rx bupropion 300mg  #90 call into [facility] [address] [phone]. Pt no longer use mailorder

## 2012-03-11 ENCOUNTER — Other Ambulatory Visit: Payer: Self-pay | Admitting: *Deleted

## 2012-03-11 ENCOUNTER — Other Ambulatory Visit: Payer: Self-pay

## 2012-03-11 MED ORDER — ALPRAZOLAM 0.5 MG PO TABS: 0.5000 mg | ORAL_TABLET | Freq: Every evening | ORAL | Status: DC | PRN

## 2012-04-10 ENCOUNTER — Other Ambulatory Visit: Payer: Self-pay | Admitting: Family Medicine

## 2012-04-10 DIAGNOSIS — F988 Other specified behavioral and emotional disorders with onset usually occurring in childhood and adolescence: Secondary | ICD-10-CM

## 2012-04-10 MED ORDER — AMPHETAMINE-DEXTROAMPHET ER 30 MG PO CP24: 30.0000 mg | ORAL_CAPSULE | ORAL | Status: DC

## 2012-04-10 NOTE — Telephone Encounter (Signed)
Adderall last filled 01-15-12, X 3

## 2012-04-10 NOTE — Telephone Encounter (Signed)
Pt needs new rx generic adderall xr 30 mg °

## 2012-04-10 NOTE — Telephone Encounter (Signed)
Refill OK

## 2012-04-10 NOTE — Telephone Encounter (Signed)
Pt informed Rx ready to pick-up on VM 

## 2012-05-07 HISTORY — PX: HYSTEROTOMY: SHX1776

## 2012-06-05 ENCOUNTER — Other Ambulatory Visit: Payer: Self-pay | Admitting: Family Medicine

## 2012-06-18 ENCOUNTER — Other Ambulatory Visit: Payer: Self-pay

## 2012-06-18 MED ORDER — ALPRAZOLAM 0.5 MG PO TABS: 0.5000 mg | ORAL_TABLET | Freq: Every evening | ORAL | Status: DC | PRN

## 2012-07-01 ENCOUNTER — Telehealth: Payer: Self-pay | Admitting: Family Medicine

## 2012-07-01 DIAGNOSIS — F329 Major depressive disorder, single episode, unspecified: Secondary | ICD-10-CM

## 2012-07-01 DIAGNOSIS — F988 Other specified behavioral and emotional disorders with onset usually occurring in childhood and adolescence: Secondary | ICD-10-CM

## 2012-07-01 MED ORDER — AMPHETAMINE-DEXTROAMPHET ER 30 MG PO CP24: 30.0000 mg | ORAL_CAPSULE | ORAL | Status: DC

## 2012-07-01 MED ORDER — ARIPIPRAZOLE 5 MG PO TABS: 5.0000 mg | ORAL_TABLET | Freq: Every day | ORAL | Status: DC

## 2012-07-01 NOTE — Telephone Encounter (Signed)
Refill for 3 months and will need office visit by then.

## 2012-07-01 NOTE — Telephone Encounter (Signed)
Patient called stating that she need a refill of her adderall and abilify. Pharmacy is [facility] [address]. Please assist.

## 2012-07-01 NOTE — Telephone Encounter (Signed)
Pt informed RX ready to pick up and informed need for ROV

## 2012-07-01 NOTE — Telephone Encounter (Signed)
Last OV feb 2013 Adderall last filled 04-10-12, #30 with 2 refills Abilify filled 06-06-11, #90 with 3 refills

## 2012-07-16 ENCOUNTER — Ambulatory Visit: Payer: BC Managed Care – PPO | Admitting: Family Medicine

## 2012-08-21 ENCOUNTER — Other Ambulatory Visit: Payer: Self-pay | Admitting: Family Medicine

## 2012-09-04 ENCOUNTER — Telehealth: Payer: Self-pay | Admitting: Family Medicine

## 2012-09-04 ENCOUNTER — Other Ambulatory Visit: Payer: Self-pay | Admitting: Family Medicine

## 2012-09-04 DIAGNOSIS — F988 Other specified behavioral and emotional disorders with onset usually occurring in childhood and adolescence: Secondary | ICD-10-CM

## 2012-09-04 MED ORDER — AMPHETAMINE-DEXTROAMPHET ER 30 MG PO CP24: 30.0000 mg | ORAL_CAPSULE | ORAL | Status: DC

## 2012-09-04 NOTE — Telephone Encounter (Signed)
Patient called stating that she need a refill of her amphetamine-dextroamphetamine (ADDERALL XR) 30 MG 24 hr capsule 1poq morning. Please assist.

## 2012-09-04 NOTE — Telephone Encounter (Signed)
Refill OK

## 2012-09-04 NOTE — Telephone Encounter (Signed)
Refill for 6 months. 

## 2012-09-04 NOTE — Telephone Encounter (Signed)
Left message on voicemail. Rx for Adderall XR is ready for pick up.

## 2012-09-07 NOTE — Telephone Encounter (Signed)
Left message on voicemail. Rx for Adderall is ready for pick up.

## 2012-09-15 ENCOUNTER — Other Ambulatory Visit (INDEPENDENT_AMBULATORY_CARE_PROVIDER_SITE_OTHER): Payer: BC Managed Care – PPO

## 2012-09-15 DIAGNOSIS — Z0000 Encounter for general adult medical examination without abnormal findings: Secondary | ICD-10-CM

## 2012-09-15 LAB — HEPATIC FUNCTION PANEL
ALT: 14 U/L (ref 0–35)
Alkaline Phosphatase: 41 U/L (ref 39–117)
Bilirubin, Direct: 0 mg/dL (ref 0.0–0.3)
Total Protein: 6.6 g/dL (ref 6.0–8.3)

## 2012-09-15 LAB — POCT URINALYSIS DIPSTICK
Protein, UA: NEGATIVE
Spec Grav, UA: 1.015
Urobilinogen, UA: 0.2

## 2012-09-15 LAB — LIPID PANEL
Cholesterol: 216 mg/dL — ABNORMAL HIGH (ref 0–200)
Total CHOL/HDL Ratio: 3

## 2012-09-15 LAB — BASIC METABOLIC PANEL
Calcium: 9.2 mg/dL (ref 8.4–10.5)
Chloride: 102 mEq/L (ref 96–112)
Creatinine, Ser: 1 mg/dL (ref 0.4–1.2)
Sodium: 137 mEq/L (ref 135–145)

## 2012-09-15 LAB — CBC WITH DIFFERENTIAL/PLATELET
Basophils Relative: 0.5 % (ref 0.0–3.0)
Eosinophils Relative: 1.8 % (ref 0.0–5.0)
Hemoglobin: 13.1 g/dL (ref 12.0–15.0)
Lymphocytes Relative: 28.5 % (ref 12.0–46.0)
MCV: 89.8 fl (ref 78.0–100.0)
Neutrophils Relative %: 62.3 % (ref 43.0–77.0)
RBC: 4.44 Mil/uL (ref 3.87–5.11)
WBC: 6.7 10*3/uL (ref 4.5–10.5)

## 2012-09-15 LAB — LDL CHOLESTEROL, DIRECT: Direct LDL: 136.7 mg/dL

## 2012-09-23 ENCOUNTER — Ambulatory Visit (INDEPENDENT_AMBULATORY_CARE_PROVIDER_SITE_OTHER): Payer: BC Managed Care – PPO | Admitting: Family Medicine

## 2012-09-23 ENCOUNTER — Encounter: Payer: Self-pay | Admitting: Family Medicine

## 2012-09-23 VITALS — BP 130/80 | HR 72 | Temp 98.0°F | Resp 12 | Ht 62.5 in | Wt 174.0 lb

## 2012-09-23 DIAGNOSIS — Z0000 Encounter for general adult medical examination without abnormal findings: Secondary | ICD-10-CM

## 2012-09-23 DIAGNOSIS — E039 Hypothyroidism, unspecified: Secondary | ICD-10-CM

## 2012-09-23 MED ORDER — LEVOTHYROXINE SODIUM 100 MCG PO TABS: 100.0000 ug | ORAL_TABLET | Freq: Every day | ORAL | Status: DC

## 2012-09-23 NOTE — Patient Instructions (Addendum)
Check with gastroenterologist (Dr Kinnie Scales) regarding when next colonoscopy due. Make sure that you schedule repeat thyroid test in 3 months

## 2012-09-23 NOTE — Progress Notes (Signed)
Subjective:    Patient ID: Tiffany Montes, female    DOB: 04-02-1956, 56 y.o.   MRN: [id number]  HPI  Patient seen for physical. She sees GYN had recent DEXA scan reportedly normal. She has Pap smears and mammograms through them. Last colonoscopy around age 58. She thinks she is due for followup at age 64 but is not sure. She's had some progressive fatigue issues recently. She thinks some of this is related to long work hours. She has hypothyroidism has been compliant with therapy. TSH with labs over 8. She has attention deficit disorder treated with Adderall. Blood pressures been stable. No recent migraine headaches. GYN has her on Estrace 1 mg. Patient nonsmoker.  Past Medical History  Diagnosis Date  . Thyroid disease     hypothyroid  . Hypertension   . Hyperlipidemia   . Gallstones 09/05/2011  . PONV (postoperative nausea and vomiting)   . Depression   . Attention deficit disorder   . Migraines   . Anxiety    Past Surgical History  Procedure Date  . Abdominal hysterectomy 2003  . Cystic hygroma excision   . Cholecystectomy 09/26/2011    Procedure: LAPAROSCOPIC CHOLECYSTECTOMY WITH INTRAOPERATIVE CHOLANGIOGRAM;  Surgeon: Currie Paris, MD;  Location: [facility];  Service: General;  Laterality: N/A;    reports that she has never smoked. She has never used smokeless tobacco. She reports that she drinks alcohol. She reports that she does not use illicit drugs. family history includes Cancer in her father; Diabetes in her father; and Heart disease in her father. Allergies  Allergen Reactions  . Morphine Sulfate Nausea And Vomiting and Rash    REACTION: GI upset      Review of Systems  Constitutional: Positive for fatigue. Negative for fever, activity change, appetite change and unexpected weight change.  HENT: Negative for hearing loss, ear pain, sore throat and trouble swallowing.   Eyes: Negative for visual disturbance.  Respiratory: Negative for cough and shortness of  breath.   Cardiovascular: Negative for chest pain and palpitations.  Gastrointestinal: Negative for abdominal pain, diarrhea, constipation and blood in stool.  Genitourinary: Negative for dysuria and hematuria.  Musculoskeletal: Negative for myalgias, back pain and arthralgias.  Skin: Negative for rash.  Neurological: Negative for dizziness, syncope and headaches.  Hematological: Negative for adenopathy.  Psychiatric/Behavioral: Negative for confusion and dysphoric mood.       Objective:   Physical Exam  Constitutional: She is oriented to person, place, and time. She appears well-developed and well-nourished.  HENT:  Head: Normocephalic and atraumatic.  Eyes: EOM are normal. Pupils are equal, round, and reactive to light.  Neck: Normal range of motion. Neck supple. No thyromegaly present.  Cardiovascular: Normal rate, regular rhythm and normal heart sounds.   No murmur heard. Pulmonary/Chest: Breath sounds normal. No respiratory distress. She has no wheezes. She has no rales.  Abdominal: Soft. Bowel sounds are normal. She exhibits no distension and no mass. There is no tenderness. There is no rebound and no guarding.  Genitourinary:       Per GYN  Musculoskeletal: Normal range of motion. She exhibits no edema.  Lymphadenopathy:    She has no cervical adenopathy.  Neurological: She is alert and oriented to person, place, and time. She displays normal reflexes. No cranial nerve deficit.  Skin: No rash noted.  Psychiatric: She has a normal mood and affect. Her behavior is normal. Judgment and thought content normal.          Assessment &  Plan:  Healthy 56 year old female. Labs reviewed. Thyroid under replaced. Increase levothyroxin to 100 mcg daily and recheck TSH in 3 months. She will check with gastroenterologist regarding when next colonoscopy due. Hemoccult cards given.

## 2012-10-01 ENCOUNTER — Other Ambulatory Visit: Payer: Self-pay | Admitting: Family Medicine

## 2012-10-05 ENCOUNTER — Telehealth: Payer: Self-pay | Admitting: Family Medicine

## 2012-10-05 DIAGNOSIS — F988 Other specified behavioral and emotional disorders with onset usually occurring in childhood and adolescence: Secondary | ICD-10-CM

## 2012-10-05 NOTE — Telephone Encounter (Signed)
Pt requesting new rx for Adderall. States she usually gets (3) 30-day rx at a time. Please call when ready for pick up.

## 2012-10-05 NOTE — Telephone Encounter (Signed)
Refill for 3 months. 

## 2012-10-06 NOTE — Telephone Encounter (Signed)
Pt is aware rx not ready for pick up yet

## 2012-10-07 HISTORY — PX: BREAST BIOPSY: SHX20

## 2012-10-08 MED ORDER — AMPHETAMINE-DEXTROAMPHET ER 30 MG PO CP24: 30.0000 mg | ORAL_CAPSULE | ORAL | Status: DC

## 2012-10-08 NOTE — Telephone Encounter (Signed)
Pt informed Rx ready to pick up

## 2012-10-30 ENCOUNTER — Other Ambulatory Visit: Payer: Self-pay | Admitting: Family Medicine

## 2012-10-30 NOTE — Telephone Encounter (Signed)
Refill for 3 months. 

## 2012-10-30 NOTE — Telephone Encounter (Signed)
Alprazolam last filled on 06-18-12, #30 with 2 refills Pt had CPX in Dec.

## 2012-10-30 NOTE — Telephone Encounter (Signed)
Pt needs refill of ALPRAZolam (XANAX) 0.5 MG tablet  And  ARIPiprazole (ABILIFY) 5 MG tablet. [facility] [address] Pt states pharm has tried several times and denied because pt unknown. ???? Pt is almost out and needs as soon as possible.

## 2012-10-31 MED ORDER — ALPRAZOLAM 0.5 MG PO TABS: 0.5000 mg | ORAL_TABLET | Freq: Every evening | ORAL | Status: DC | PRN

## 2012-12-22 ENCOUNTER — Ambulatory Visit: Payer: BC Managed Care – PPO | Admitting: Family Medicine

## 2012-12-22 ENCOUNTER — Other Ambulatory Visit: Payer: Self-pay

## 2012-12-22 ENCOUNTER — Other Ambulatory Visit: Payer: BC Managed Care – PPO

## 2012-12-22 DIAGNOSIS — Z1231 Encounter for screening mammogram for malignant neoplasm of breast: Secondary | ICD-10-CM

## 2012-12-23 ENCOUNTER — Telehealth: Payer: Self-pay | Admitting: Family Medicine

## 2012-12-23 DIAGNOSIS — F329 Major depressive disorder, single episode, unspecified: Secondary | ICD-10-CM

## 2012-12-23 MED ORDER — ARIPIPRAZOLE 5 MG PO TABS: 5.0000 mg | ORAL_TABLET | Freq: Every day | ORAL | Status: DC

## 2012-12-23 NOTE — Telephone Encounter (Signed)
Patient called stating that she need a refill of her abilify 5mg  1poqd [facility] [address]. Please assist.

## 2012-12-24 ENCOUNTER — Other Ambulatory Visit: Payer: BC Managed Care – PPO

## 2012-12-24 ENCOUNTER — Other Ambulatory Visit (INDEPENDENT_AMBULATORY_CARE_PROVIDER_SITE_OTHER): Payer: BC Managed Care – PPO

## 2012-12-24 DIAGNOSIS — E039 Hypothyroidism, unspecified: Secondary | ICD-10-CM

## 2012-12-25 NOTE — Progress Notes (Signed)
Quick Note:  Pt informed on home VM ______ 

## 2013-01-05 ENCOUNTER — Telehealth: Payer: Self-pay | Admitting: Family Medicine

## 2013-01-05 DIAGNOSIS — F988 Other specified behavioral and emotional disorders with onset usually occurring in childhood and adolescence: Secondary | ICD-10-CM

## 2013-01-05 NOTE — Telephone Encounter (Signed)
Pt called to request a refill of her amphetamine-dextroamphetamine (ADDERALL XR) 30 MG 24 hr capsule. Please assist.

## 2013-01-06 MED ORDER — AMPHETAMINE-DEXTROAMPHET ER 30 MG PO CP24: 30.0000 mg | ORAL_CAPSULE | ORAL | Status: DC

## 2013-01-06 NOTE — Telephone Encounter (Signed)
Pt informed on cell VM ready to pick-up

## 2013-01-06 NOTE — Telephone Encounter (Signed)
adderall last filled 1-2 14, #30 with 2 refills

## 2013-01-06 NOTE — Telephone Encounter (Signed)
Refill OK

## 2013-01-08 ENCOUNTER — Other Ambulatory Visit: Payer: Self-pay | Admitting: Family Medicine

## 2013-01-14 ENCOUNTER — Ambulatory Visit
Admission: RE | Admit: 2013-01-14 | Discharge: 2013-01-14 | Disposition: A | Discharge status: Home / Self Care | Payer: BC Managed Care – PPO | Source: Ambulatory Visit

## 2013-01-14 DIAGNOSIS — Z1231 Encounter for screening mammogram for malignant neoplasm of breast: Secondary | ICD-10-CM

## 2013-01-15 ENCOUNTER — Other Ambulatory Visit: Payer: Self-pay | Admitting: Obstetrics & Gynecology

## 2013-01-15 DIAGNOSIS — R928 Other abnormal and inconclusive findings on diagnostic imaging of breast: Secondary | ICD-10-CM

## 2013-01-21 ENCOUNTER — Other Ambulatory Visit: Payer: Self-pay | Admitting: Family Medicine

## 2013-01-22 NOTE — Telephone Encounter (Signed)
Alprazolam at [facility] last filled 10-31-12, #30 with 2 refills

## 2013-01-24 NOTE — Telephone Encounter (Signed)
Refill for 3 months. 

## 2013-01-28 ENCOUNTER — Other Ambulatory Visit: Payer: Self-pay | Admitting: Obstetrics & Gynecology

## 2013-01-28 ENCOUNTER — Other Ambulatory Visit (HOSPITAL_COMMUNITY): Payer: Self-pay | Admitting: Diagnostic Radiology

## 2013-01-28 ENCOUNTER — Ambulatory Visit
Admission: RE | Admit: 2013-01-28 | Discharge: 2013-01-28 | Disposition: A | Discharge status: Home / Self Care | Payer: BC Managed Care – PPO | Source: Ambulatory Visit | Attending: Obstetrics & Gynecology | Admitting: Obstetrics & Gynecology

## 2013-01-28 DIAGNOSIS — R928 Other abnormal and inconclusive findings on diagnostic imaging of breast: Secondary | ICD-10-CM

## 2013-03-29 ENCOUNTER — Telehealth: Payer: Self-pay | Admitting: Family Medicine

## 2013-03-29 DIAGNOSIS — F988 Other specified behavioral and emotional disorders with onset usually occurring in childhood and adolescence: Secondary | ICD-10-CM

## 2013-03-29 MED ORDER — AMPHETAMINE-DEXTROAMPHET ER 30 MG PO CP24: 30.0000 mg | ORAL_CAPSULE | ORAL | Status: DC

## 2013-03-29 NOTE — Telephone Encounter (Signed)
Patient informed requested Rx ready for p/u Mon-Fri 8a-5p, understood/SLS

## 2013-03-29 NOTE — Telephone Encounter (Signed)
Refill OK

## 2013-03-29 NOTE — Telephone Encounter (Signed)
PT called to request a 3 month supply of her amphetamine-dextroamphetamine (ADDERALL XR) 30 MG 24 hr capsule. Please assist.

## 2013-03-29 NOTE — Addendum Note (Signed)
Addended by: Regis Bill on: 03/29/2013 05:16 PM   Modules accepted: Orders, Medications

## 2013-04-14 ENCOUNTER — Ambulatory Visit (INDEPENDENT_AMBULATORY_CARE_PROVIDER_SITE_OTHER): Payer: BC Managed Care – PPO | Admitting: Family Medicine

## 2013-04-14 ENCOUNTER — Encounter: Payer: Self-pay | Admitting: Family Medicine

## 2013-04-14 VITALS — BP 128/70 | HR 70 | Temp 97.5°F | Ht 63.0 in | Wt 187.0 lb

## 2013-04-14 DIAGNOSIS — R5383 Other fatigue: Secondary | ICD-10-CM

## 2013-04-14 DIAGNOSIS — E039 Hypothyroidism, unspecified: Secondary | ICD-10-CM

## 2013-04-14 DIAGNOSIS — R635 Abnormal weight gain: Secondary | ICD-10-CM

## 2013-04-14 DIAGNOSIS — R5381 Other malaise: Secondary | ICD-10-CM

## 2013-04-14 LAB — TSH: TSH: 3.93 u[IU]/mL (ref 0.35–5.50)

## 2013-04-14 NOTE — Patient Instructions (Addendum)

## 2013-04-14 NOTE — Progress Notes (Signed)
  Subjective:    Patient ID: Tiffany Montes, female    DOB: [birth year], 57 y.o.   MRN: [id number]  HPI  Patient seen with complaints of fatigue. She's concerned her thyroid may be out of normal range. She had recent TSH back in March and normal range. Compliant with therapy. She takes levothyroxin regularly. She has some chronic insomnia. She falls asleep but cannot stay asleep. She is concerned because some recent weight gain. Feels fatigued much of the time. History of depression which has been mostly stable.  She has recently started [employer]. Not consistently exercising but walking some. Hypertension which has been well controlled.  Past Medical History  Diagnosis Date  . Thyroid disease     hypothyroid  . Hypertension   . Hyperlipidemia   . Gallstones 09/05/2011  . PONV (postoperative nausea and vomiting)   . Depression   . Attention deficit disorder   . Migraines   . Anxiety    Past Surgical History  Procedure Laterality Date  . Abdominal hysterectomy  2003  . Cystic hygroma excision    . Cholecystectomy  09/26/2011    Procedure: LAPAROSCOPIC CHOLECYSTECTOMY WITH INTRAOPERATIVE CHOLANGIOGRAM;  Surgeon: Currie Paris, MD;  Location: [facility];  Service: General;  Laterality: N/A;    reports that she has never smoked. She has never used smokeless tobacco. She reports that  drinks alcohol. She reports that she does not use illicit drugs. family history includes Cancer in her father; Diabetes in her father; and Heart disease in her father. Allergies  Allergen Reactions  . Morphine Sulfate Nausea And Vomiting and Rash    REACTION: GI upset        Review of Systems  Constitutional: Positive for fatigue and unexpected weight change. Negative for appetite change.  Respiratory: Negative for cough and shortness of breath.   Cardiovascular: Negative for chest pain.  Gastrointestinal: Negative for abdominal pain.  Endocrine: Negative for polydipsia and polyuria.   Neurological: Negative for dizziness, weakness and headaches.  Hematological: Negative for adenopathy. Does not bruise/bleed easily.       Objective:   Physical Exam  Constitutional: She appears well-developed and well-nourished.  Neck: Neck supple. No thyromegaly present.  Cardiovascular: Normal rate and regular rhythm.   Pulmonary/Chest: Effort normal and breath sounds normal. No respiratory distress. She has no wheezes. She has no rales.  Musculoskeletal: She exhibits no edema.          Assessment & Plan:  Fatigue. Likely multifactorial. Doubt related to thyroid issues as recent TSH normal. Suspect more likely related to weight gain, poor sleep quality, and lack of exercise. Repeat TSH. Continue weight loss efforts. Establish more consistent exercise. Sleep hygiene discussed with handout given. She'll try some over-the-counter Benadryl to see if this helps with sleep.

## 2013-04-19 ENCOUNTER — Other Ambulatory Visit: Payer: Self-pay | Admitting: Family Medicine

## 2013-04-20 NOTE — Telephone Encounter (Signed)
Refill for 3 months. 

## 2013-05-09 ENCOUNTER — Other Ambulatory Visit: Payer: Self-pay | Admitting: Family Medicine

## 2013-06-30 ENCOUNTER — Other Ambulatory Visit: Payer: Self-pay | Admitting: Obstetrics & Gynecology

## 2013-06-30 ENCOUNTER — Telehealth: Payer: Self-pay | Admitting: Family Medicine

## 2013-06-30 DIAGNOSIS — R921 Mammographic calcification found on diagnostic imaging of breast: Secondary | ICD-10-CM

## 2013-06-30 DIAGNOSIS — F988 Other specified behavioral and emotional disorders with onset usually occurring in childhood and adolescence: Secondary | ICD-10-CM

## 2013-06-30 NOTE — Telephone Encounter (Signed)
Refill OK

## 2013-06-30 NOTE — Telephone Encounter (Signed)
Pt is calling to request a 3 month supply of her amphetamine-dextroamphetamine (ADDERALL XR) 30 MG 24 hr capsule. Please assist.

## 2013-06-30 NOTE — Telephone Encounter (Signed)
Last refill 03/29/13 #30 0 refill Last visit 04/14/13

## 2013-07-01 MED ORDER — AMPHETAMINE-DEXTROAMPHET ER 30 MG PO CP24: 30.0000 mg | ORAL_CAPSULE | ORAL | Status: DC

## 2013-07-01 NOTE — Telephone Encounter (Signed)
Left message that RX will be at the front for pickup

## 2013-07-08 ENCOUNTER — Other Ambulatory Visit: Payer: Self-pay | Admitting: Family Medicine

## 2013-07-08 NOTE — Telephone Encounter (Signed)
Refill for 3 months. 

## 2013-07-08 NOTE — Telephone Encounter (Signed)
Last refill 04/19/13 #30 2 refills

## 2013-08-16 ENCOUNTER — Ambulatory Visit
Admission: RE | Admit: 2013-08-16 | Discharge: 2013-08-16 | Disposition: A | Discharge status: Home / Self Care | Payer: BC Managed Care – PPO | Source: Ambulatory Visit | Attending: Obstetrics & Gynecology | Admitting: Obstetrics & Gynecology

## 2013-08-16 ENCOUNTER — Other Ambulatory Visit: Payer: Self-pay | Admitting: Obstetrics & Gynecology

## 2013-08-16 DIAGNOSIS — R921 Mammographic calcification found on diagnostic imaging of breast: Secondary | ICD-10-CM

## 2013-08-18 ENCOUNTER — Ambulatory Visit
Admission: RE | Admit: 2013-08-18 | Discharge: 2013-08-18 | Disposition: A | Discharge status: Home / Self Care | Payer: BC Managed Care – PPO | Source: Ambulatory Visit | Attending: Obstetrics & Gynecology | Admitting: Obstetrics & Gynecology

## 2013-08-18 ENCOUNTER — Other Ambulatory Visit: Payer: Self-pay | Admitting: Obstetrics & Gynecology

## 2013-08-18 ENCOUNTER — Other Ambulatory Visit: Payer: Self-pay | Admitting: Diagnostic Radiology

## 2013-08-18 DIAGNOSIS — N6320 Unspecified lump in the left breast, unspecified quadrant: Secondary | ICD-10-CM

## 2013-08-18 DIAGNOSIS — R921 Mammographic calcification found on diagnostic imaging of breast: Secondary | ICD-10-CM

## 2013-09-28 ENCOUNTER — Telehealth: Payer: Self-pay | Admitting: Family Medicine

## 2013-09-28 ENCOUNTER — Other Ambulatory Visit: Payer: Self-pay | Admitting: Family Medicine

## 2013-09-28 DIAGNOSIS — F988 Other specified behavioral and emotional disorders with onset usually occurring in childhood and adolescence: Secondary | ICD-10-CM

## 2013-09-28 MED ORDER — AMPHETAMINE-DEXTROAMPHET ER 30 MG PO CP24: 30.0000 mg | ORAL_CAPSULE | ORAL | Status: DC

## 2013-09-28 NOTE — Telephone Encounter (Signed)
Last visit 04-14-13 Last refill 07-08-13 #30 2 refill

## 2013-09-28 NOTE — Telephone Encounter (Signed)
RX is ready for pick up and Pt is aware

## 2013-09-28 NOTE — Telephone Encounter (Signed)
Pt needs new rx generic adderall xr 30mg #30 °

## 2013-09-28 NOTE — Telephone Encounter (Signed)
Last visit 04-14-13 Last refill 07/01/13 #30 2 refilll

## 2013-09-28 NOTE — Telephone Encounter (Signed)
Refill OK

## 2013-09-29 NOTE — Telephone Encounter (Signed)
Refill for 3 months. 

## 2013-10-22 ENCOUNTER — Other Ambulatory Visit: Payer: Self-pay | Admitting: Family Medicine

## 2013-11-04 ENCOUNTER — Other Ambulatory Visit: Payer: Self-pay | Admitting: Family Medicine

## 2013-12-01 ENCOUNTER — Other Ambulatory Visit: Payer: Self-pay | Admitting: Family Medicine

## 2013-12-23 ENCOUNTER — Other Ambulatory Visit: Payer: Self-pay | Admitting: Family Medicine

## 2013-12-23 NOTE — Telephone Encounter (Signed)
Refill for 3  Months.

## 2013-12-23 NOTE — Telephone Encounter (Signed)
Last visit 04/14/13 Last refill 09/28/13 #30 2 refill

## 2013-12-24 ENCOUNTER — Telehealth: Payer: Self-pay | Admitting: Family Medicine

## 2013-12-24 DIAGNOSIS — F988 Other specified behavioral and emotional disorders with onset usually occurring in childhood and adolescence: Secondary | ICD-10-CM

## 2013-12-24 NOTE — Telephone Encounter (Signed)
Pt is needing new rx amphetamine-dextroamphetamine (ADDERALL XR) 30 MG 24 hr capsule, please call when available to pick up. Also pt states the pharmacy faxed over the rx for her alprazolam (xanax) 0.5 mg, but did not get a response pt [facility] [address]

## 2013-12-24 NOTE — Telephone Encounter (Signed)
Last visit 04/14/13 Last refill 09/28/13 #30 2 refills

## 2013-12-26 NOTE — Telephone Encounter (Signed)
Refill both for 3 months. 

## 2013-12-27 MED ORDER — AMPHETAMINE-DEXTROAMPHET ER 30 MG PO CP24: 30.0000 mg | ORAL_CAPSULE | ORAL | Status: DC

## 2013-12-27 NOTE — Telephone Encounter (Signed)
Left message RX is ready for pick up

## 2014-01-10 ENCOUNTER — Other Ambulatory Visit: Payer: Self-pay | Admitting: Family Medicine

## 2014-01-29 ENCOUNTER — Other Ambulatory Visit: Payer: Self-pay | Admitting: Family Medicine

## 2014-03-03 ENCOUNTER — Encounter: Payer: Self-pay | Admitting: Family Medicine

## 2014-03-03 ENCOUNTER — Ambulatory Visit (INDEPENDENT_AMBULATORY_CARE_PROVIDER_SITE_OTHER): Payer: BC Managed Care – PPO | Admitting: Family Medicine

## 2014-03-03 VITALS — BP 128/80 | HR 70 | Temp 97.8°F | Wt 189.0 lb

## 2014-03-03 DIAGNOSIS — F5104 Psychophysiologic insomnia: Secondary | ICD-10-CM

## 2014-03-03 DIAGNOSIS — I10 Essential (primary) hypertension: Secondary | ICD-10-CM

## 2014-03-03 DIAGNOSIS — G4700 Insomnia, unspecified: Secondary | ICD-10-CM

## 2014-03-03 DIAGNOSIS — R5383 Other fatigue: Secondary | ICD-10-CM

## 2014-03-03 DIAGNOSIS — R5381 Other malaise: Secondary | ICD-10-CM

## 2014-03-03 DIAGNOSIS — L259 Unspecified contact dermatitis, unspecified cause: Secondary | ICD-10-CM

## 2014-03-03 DIAGNOSIS — E039 Hypothyroidism, unspecified: Secondary | ICD-10-CM

## 2014-03-03 LAB — TSH: TSH: 1.53 u[IU]/mL (ref 0.35–4.50)

## 2014-03-03 MED ORDER — CLONAZEPAM 0.5 MG PO TABS
0.5000 mg | ORAL_TABLET | Freq: Every day | ORAL | Status: DC
Start: 2014-03-03 — End: 2014-06-26

## 2014-03-03 NOTE — Patient Instructions (Signed)

## 2014-03-03 NOTE — Progress Notes (Signed)
Pre visit review using our clinic review tool, if applicable. No additional management support is needed unless otherwise documented below in the visit note. 

## 2014-03-03 NOTE — Progress Notes (Signed)
   Subjective:    Patient ID: Tiffany Montes, female    DOB: 08-31-1956, 58 y.o.   MRN: [id number]  Thyroid Problem Symptoms include fatigue. Patient reports no palpitations.   Patient seen with complaints of fatigue. These have been somewhat chronic. She has mostly difficulty sleeping. She falls asleep but frequently wakes up. She feels her depression is been stable. She relates this to stress issues. Previous to taking Xanax at night still wakes up after 4-5 hours and can get back to sleep. She has pervasive fatigue. No history of any observed apnea episodes. She has hypothyroidism and takes thyroid replacement regularly. She is requesting repeat thyroid levels  Hypertension treated with lisinopril HCTZ. Blood pressure stable. No dizziness. No chest pains. No peripheral edema issues.  New problem with pruritic rash right lower back. Present for one week. Noted after doing yard work. No alleviating factors. Moderate pruritus. Nonpainful  Past Medical History  Diagnosis Date  . Thyroid disease     hypothyroid  . Hypertension   . Hyperlipidemia   . Gallstones 09/05/2011  . PONV (postoperative nausea and vomiting)   . Depression   . Attention deficit disorder   . Migraines   . Anxiety    Past Surgical History  Procedure Laterality Date  . Abdominal hysterectomy  2003  . Cystic hygroma excision    . Cholecystectomy  09/26/2011    Procedure: LAPAROSCOPIC CHOLECYSTECTOMY WITH INTRAOPERATIVE CHOLANGIOGRAM;  Surgeon: Haywood Lasso, MD;  Location: [place];  Service: General;  Laterality: N/A;    reports that she has never smoked. She has never used smokeless tobacco. She reports that she drinks alcohol. She reports that she does not use illicit drugs. family history includes Cancer in her father; Diabetes in her father; Heart disease in her father. Allergies  Allergen Reactions  . Morphine Sulfate Nausea And Vomiting and Rash    REACTION: GI upset      Review of Systems    Constitutional: Positive for fatigue.  Eyes: Negative for visual disturbance.  Respiratory: Negative for cough, chest tightness, shortness of breath and wheezing.   Cardiovascular: Negative for chest pain, palpitations and leg swelling.  Skin: Positive for rash.  Neurological: Negative for dizziness, seizures, syncope, weakness, light-headedness and headaches.       Objective:   Physical Exam  Constitutional: She appears well-developed and well-nourished.  Neck: Neck supple. No thyromegaly present.  Cardiovascular: Normal rate and regular rhythm.  Exam reveals no gallop.   No murmur heard. Pulmonary/Chest: Effort normal and breath sounds normal. No respiratory distress. She has no wheezes. She has no rales.  Musculoskeletal: She exhibits no edema.  Skin: Rash noted.  Patient has erythematous slightly raised rash right lower back region. This covers an area about 6 x 10 cm. She has some areas of linear erythema. Nontender. No pustules          Assessment & Plan:  #1 fatigue. Likely multifactorial. She does not engage in regular exercise and has had some recent weight gain. Chronic poor sleep quality. She does have hypothyroidism recheck TSH #2 chronic insomnia. Sleep hygiene discussed with handout given. Discontinue Xanax. Trial of clonazepam 0.5 mg 1 each bedtime. She does not consume regular alcohol. Minimal caffeine use #3 contact dermatitis right lower back. We discussed possible use of steroids and she wishes to avoid. She'll treat symptomatically #4 hypertension which is stable

## 2014-03-04 ENCOUNTER — Telehealth: Payer: Self-pay | Admitting: Family Medicine

## 2014-03-04 NOTE — Telephone Encounter (Signed)
Relevant patient education mailed to patient.  

## 2014-03-15 ENCOUNTER — Other Ambulatory Visit: Payer: Self-pay | Admitting: Family Medicine

## 2014-03-22 ENCOUNTER — Telehealth: Payer: Self-pay | Admitting: Family Medicine

## 2014-03-22 DIAGNOSIS — F988 Other specified behavioral and emotional disorders with onset usually occurring in childhood and adolescence: Secondary | ICD-10-CM

## 2014-03-22 NOTE — Telephone Encounter (Signed)
Refill OK

## 2014-03-22 NOTE — Telephone Encounter (Signed)
Last visit 03/03/14 Last refill 12/27/13 #30 2 refill

## 2014-03-22 NOTE — Telephone Encounter (Signed)
Pt is needing new rx amphetamine-dextroamphetamine (ADDERALL XR) 30 MG 24 hr capsule, pt states she is going out of town for a week and want to get the rx to her pharmacy.

## 2014-03-23 MED ORDER — AMPHETAMINE-DEXTROAMPHET ER 30 MG PO CP24: 30.0000 mg | ORAL_CAPSULE | ORAL | Status: DC

## 2014-03-23 NOTE — Telephone Encounter (Signed)
Pt informed that RX is ready for pick up 

## 2014-04-27 ENCOUNTER — Other Ambulatory Visit: Payer: Self-pay | Admitting: Family Medicine

## 2014-06-06 ENCOUNTER — Other Ambulatory Visit: Payer: Self-pay

## 2014-06-06 MED ORDER — BUPROPION HCL ER (XL) 300 MG PO TB24: ORAL_TABLET | ORAL | Status: DC

## 2014-06-26 ENCOUNTER — Other Ambulatory Visit: Payer: Self-pay | Admitting: Family Medicine

## 2014-06-27 ENCOUNTER — Telehealth: Payer: Self-pay | Admitting: Family Medicine

## 2014-06-27 DIAGNOSIS — F988 Other specified behavioral and emotional disorders with onset usually occurring in childhood and adolescence: Secondary | ICD-10-CM

## 2014-06-27 MED ORDER — AMPHETAMINE-DEXTROAMPHET ER 30 MG PO CP24: 30.0000 mg | ORAL_CAPSULE | ORAL | Status: DC

## 2014-06-27 NOTE — Telephone Encounter (Signed)
Refill OK

## 2014-06-27 NOTE — Telephone Encounter (Signed)
Left message on VM that Rx is ready for pick up 

## 2014-06-27 NOTE — Telephone Encounter (Signed)
Last visit 03/03/14 Last refill 03/23/14 #30 2 refill

## 2014-06-27 NOTE — Telephone Encounter (Signed)
Patient would like her ADDERALL refilled.

## 2014-06-27 NOTE — Telephone Encounter (Signed)
Last visit 03/03/14 Last refill 03/03/14 #30 3 refill

## 2014-06-27 NOTE — Telephone Encounter (Signed)
Refill for 3 months. 

## 2014-08-15 ENCOUNTER — Other Ambulatory Visit: Payer: Self-pay | Admitting: Family Medicine

## 2014-09-03 ENCOUNTER — Other Ambulatory Visit: Payer: Self-pay | Admitting: Family Medicine

## 2014-09-19 ENCOUNTER — Other Ambulatory Visit: Payer: Self-pay | Admitting: Family Medicine

## 2014-09-19 NOTE — Telephone Encounter (Signed)
Last visit 03/03/14 Last refill 06/27/14 #30 2 refill

## 2014-09-19 NOTE — Telephone Encounter (Signed)
Refill for 3 months. 

## 2014-09-21 ENCOUNTER — Other Ambulatory Visit: Payer: Self-pay | Admitting: Family Medicine

## 2014-09-26 ENCOUNTER — Telehealth: Payer: Self-pay | Admitting: Family Medicine

## 2014-09-26 DIAGNOSIS — F988 Other specified behavioral and emotional disorders with onset usually occurring in childhood and adolescence: Secondary | ICD-10-CM

## 2014-09-26 MED ORDER — AMPHETAMINE-DEXTROAMPHET ER 30 MG PO CP24: 30.0000 mg | ORAL_CAPSULE | ORAL | Status: DC

## 2014-09-26 NOTE — Telephone Encounter (Signed)
Last visit 03/03/14 Last refill 06/27/14 #30 2 refills

## 2014-09-26 NOTE — Telephone Encounter (Signed)
Refill for 3 months. 

## 2014-09-26 NOTE — Telephone Encounter (Signed)
Pt needs new rx generic adderall xr 30 mg. Pt will be out this saturday

## 2014-09-26 NOTE — Telephone Encounter (Signed)
Left message on Vm that Rx is ready for pickup

## 2014-11-04 ENCOUNTER — Other Ambulatory Visit: Payer: Self-pay

## 2014-11-04 DIAGNOSIS — Z1231 Encounter for screening mammogram for malignant neoplasm of breast: Secondary | ICD-10-CM

## 2014-11-17 ENCOUNTER — Other Ambulatory Visit: Payer: Self-pay

## 2014-11-23 ENCOUNTER — Other Ambulatory Visit (INDEPENDENT_AMBULATORY_CARE_PROVIDER_SITE_OTHER): Payer: BLUE CROSS/BLUE SHIELD

## 2014-11-23 DIAGNOSIS — Z0000 Encounter for general adult medical examination without abnormal findings: Secondary | ICD-10-CM

## 2014-11-23 LAB — LIPID PANEL
CHOL/HDL RATIO: 3
Cholesterol: 211 mg/dL — ABNORMAL HIGH (ref 0–200)
HDL: 63.1 mg/dL (ref >39.00)
LDL CALC: 125 mg/dL — AB (ref 0–99)
NONHDL: 147.9
TRIGLYCERIDES: 114 mg/dL (ref 0.0–149.0)
VLDL: 22.8 mg/dL (ref 0.0–40.0)

## 2014-11-23 LAB — BASIC METABOLIC PANEL
BUN: 19 mg/dL (ref 6–23)
CO2: 27 mEq/L (ref 19–32)
Calcium: 9.6 mg/dL (ref 8.4–10.5)
Chloride: 101 mEq/L (ref 96–112)
Creatinine, Ser: 0.82 mg/dL (ref 0.40–1.20)
GFR: 75.64 mL/min (ref >60.00)
GLUCOSE: 108 mg/dL — AB (ref 70–99)
Potassium: 4.5 mEq/L (ref 3.5–5.1)
Sodium: 136 mEq/L (ref 135–145)

## 2014-11-23 LAB — CBC WITH DIFFERENTIAL/PLATELET
BASOS PCT: 0.6 % (ref 0.0–3.0)
Basophils Absolute: 0 10*3/uL (ref 0.0–0.1)
EOS ABS: 0.1 10*3/uL (ref 0.0–0.7)
Eosinophils Relative: 2.3 % (ref 0.0–5.0)
HCT: 40.6 % (ref 36.0–46.0)
Hemoglobin: 14 g/dL (ref 12.0–15.0)
Lymphocytes Relative: 31 % (ref 12.0–46.0)
Lymphs Abs: 1.8 10*3/uL (ref 0.7–4.0)
MCHC: 34.5 g/dL (ref 30.0–36.0)
MCV: 86.4 fl (ref 78.0–100.0)
MONO ABS: 0.4 10*3/uL (ref 0.1–1.0)
Monocytes Relative: 7.2 % (ref 3.0–12.0)
NEUTROS PCT: 58.9 % (ref 43.0–77.0)
Neutro Abs: 3.4 10*3/uL (ref 1.4–7.7)
Platelets: 241 10*3/uL (ref 150.0–400.0)
RBC: 4.7 Mil/uL (ref 3.87–5.11)
RDW: 12.8 % (ref 11.5–15.5)
WBC: 5.7 10*3/uL (ref 4.0–10.5)

## 2014-11-23 LAB — HEPATIC FUNCTION PANEL
ALK PHOS: 51 U/L (ref 39–117)
ALT: 12 U/L (ref 0–35)
AST: 14 U/L (ref 0–37)
Albumin: 3.9 g/dL (ref 3.5–5.2)
Bilirubin, Direct: 0.1 mg/dL (ref 0.0–0.3)
TOTAL PROTEIN: 6.8 g/dL (ref 6.0–8.3)
Total Bilirubin: 0.4 mg/dL (ref 0.2–1.2)

## 2014-11-23 LAB — TSH: TSH: 7.11 u[IU]/mL — AB (ref 0.35–4.50)

## 2014-11-25 ENCOUNTER — Ambulatory Visit: Payer: Self-pay

## 2014-11-25 ENCOUNTER — Encounter: Payer: Self-pay | Admitting: Family Medicine

## 2014-11-29 ENCOUNTER — Telehealth: Payer: Self-pay | Admitting: Family Medicine

## 2014-11-29 DIAGNOSIS — F988 Other specified behavioral and emotional disorders with onset usually occurring in childhood and adolescence: Secondary | ICD-10-CM

## 2014-11-29 NOTE — Telephone Encounter (Signed)
Last  Visit 03/03/14 Last refill 09/26/14 #30 2 refill

## 2014-11-29 NOTE — Telephone Encounter (Signed)
Refill OK

## 2014-11-29 NOTE — Telephone Encounter (Signed)
Patient need re-fill on amphetamine-dextroamphetamine (ADDERALL XR) 30 MG 24 hr capsule

## 2014-11-30 MED ORDER — AMPHETAMINE-DEXTROAMPHET ER 30 MG PO CP24
30.0000 mg | ORAL_CAPSULE | ORAL | Status: DC
Start: 2014-11-30 — End: 2015-02-23

## 2014-11-30 MED ORDER — AMPHETAMINE-DEXTROAMPHET ER 30 MG PO CP24: 30.0000 mg | ORAL_CAPSULE | ORAL | Status: DC

## 2014-11-30 NOTE — Telephone Encounter (Signed)
Pt aware that RX is ready for pick up  

## 2014-12-02 ENCOUNTER — Ambulatory Visit (INDEPENDENT_AMBULATORY_CARE_PROVIDER_SITE_OTHER): Payer: BLUE CROSS/BLUE SHIELD | Admitting: Family Medicine

## 2014-12-02 ENCOUNTER — Encounter: Payer: Self-pay | Admitting: Family Medicine

## 2014-12-02 VITALS — BP 124/80 | HR 81 | Temp 98.0°F | Ht 63.0 in | Wt 187.0 lb

## 2014-12-02 DIAGNOSIS — Z0000 Encounter for general adult medical examination without abnormal findings: Secondary | ICD-10-CM

## 2014-12-02 MED ORDER — SERTRALINE HCL 50 MG PO TABS: 50.0000 mg | ORAL_TABLET | Freq: Every day | ORAL | Status: DC

## 2014-12-02 MED ORDER — LEVOTHYROXINE SODIUM 112 MCG PO TABS: 112.0000 ug | ORAL_TABLET | Freq: Every day | ORAL | Status: DC

## 2014-12-02 MED ORDER — BETAMETHASONE DIPROPIONATE 0.05 % EX CREA: TOPICAL_CREAM | Freq: Two times a day (BID) | CUTANEOUS | Status: DC

## 2014-12-02 NOTE — Progress Notes (Signed)
Subjective:    Patient ID: Tiffany Montes, female    DOB: 06-03-1956, 59 y.o.   MRN: [id number]  HPI Seen for complete physical. She sees gynecologist regularly. She has mammogram scheduled next week. Colonoscopy is up-to-date.  Patient has chronic problems of obesity, hypothyroidism, hypertension, hyperlipidemia, recurrent depression, attention deficit disorder. She is dealing with tremendous stress of her son who has heroin addiction. She feels her depression is not stable at this time. She is on Wellbutrin. Previously took Abilify and even though she was tolerating this she had concern about potential side effects. She frequently feels anxious.  Past Medical History  Diagnosis Date  . Thyroid disease     hypothyroid  . Hypertension   . Hyperlipidemia   . Gallstones 09/05/2011  . PONV (postoperative nausea and vomiting)   . Depression   . Attention deficit disorder   . Migraines   . Anxiety    Past Surgical History  Procedure Laterality Date  . Abdominal hysterectomy  2003  . Cystic hygroma excision    . Cholecystectomy  09/26/2011    Procedure: LAPAROSCOPIC CHOLECYSTECTOMY WITH INTRAOPERATIVE CHOLANGIOGRAM;  Surgeon: Haywood Lasso, MD;  Location: [place];  Service: General;  Laterality: N/A;    reports that she has never smoked. She has never used smokeless tobacco. She reports that she drinks alcohol. She reports that she does not use illicit drugs. family history includes Cancer in her father; Diabetes in her father; Heart disease in her father. Allergies  Allergen Reactions  . Morphine Sulfate Nausea And Vomiting and Rash    REACTION: GI upset       Review of Systems  Constitutional: Positive for fatigue. Negative for fever, activity change, appetite change and unexpected weight change.  HENT: Negative for ear pain, hearing loss, sore throat and trouble swallowing.   Eyes: Negative for visual disturbance.  Respiratory: Negative for cough and shortness of  breath.   Cardiovascular: Negative for chest pain and palpitations.  Gastrointestinal: Negative for abdominal pain, diarrhea, constipation and blood in stool.  Genitourinary: Negative for dysuria and hematuria.  Musculoskeletal: Negative for myalgias, back pain and arthralgias.  Skin: Negative for rash.  Neurological: Negative for dizziness, syncope and headaches.  Hematological: Negative for adenopathy.  Psychiatric/Behavioral: Positive for dysphoric mood. Negative for suicidal ideas and confusion. The patient is nervous/anxious.        Objective:   Physical Exam  Constitutional: She is oriented to person, place, and time. She appears well-developed and well-nourished.  HENT:  Head: Normocephalic and atraumatic.  Eyes: EOM are normal. Pupils are equal, round, and reactive to light.  Neck: Normal range of motion. Neck supple. No thyromegaly present.  Cardiovascular: Normal rate, regular rhythm and normal heart sounds.   No murmur heard. Pulmonary/Chest: Breath sounds normal. No respiratory distress. She has no wheezes. She has no rales.  Abdominal: Soft. Bowel sounds are normal. She exhibits no distension and no mass. There is no tenderness. There is no rebound and no guarding.  Genitourinary:  Per GYN  Musculoskeletal: Normal range of motion. She exhibits no edema.  Lymphadenopathy:    She has no cervical adenopathy.  Neurological: She is alert and oriented to person, place, and time. She displays normal reflexes. No cranial nerve deficit.  Skin: No rash noted.  Psychiatric: She has a normal mood and affect. Her behavior is normal. Judgment and thought content normal.          Assessment & Plan:  Complete physical. Labs reviewed. Her  TSH is elevated and we've titrated her levothyroxine to 112 g daily. We'll plan to reassess TSH in 3 months. She is strongly encouraged to lose weight. Add Zoloft 50 mg once daily to her Wellbutrin. Reassess in one month.

## 2014-12-02 NOTE — Progress Notes (Signed)
Pre visit review using our clinic review tool, if applicable. No additional management support is needed unless otherwise documented below in the visit note. 

## 2014-12-09 ENCOUNTER — Ambulatory Visit
Admission: RE | Admit: 2014-12-09 | Discharge: 2014-12-09 | Disposition: A | Discharge status: Home / Self Care | Payer: BLUE CROSS/BLUE SHIELD | Source: Ambulatory Visit

## 2014-12-09 DIAGNOSIS — Z1231 Encounter for screening mammogram for malignant neoplasm of breast: Secondary | ICD-10-CM

## 2014-12-14 ENCOUNTER — Other Ambulatory Visit: Payer: Self-pay | Admitting: Family Medicine

## 2014-12-14 NOTE — Telephone Encounter (Signed)
Klonopin  Last visit 12/02/14 Last refill 09/20/14 #30 2 refill

## 2014-12-14 NOTE — Telephone Encounter (Signed)
Refills OK. 

## 2014-12-27 ENCOUNTER — Encounter: Payer: Self-pay | Admitting: Family Medicine

## 2014-12-27 ENCOUNTER — Ambulatory Visit (INDEPENDENT_AMBULATORY_CARE_PROVIDER_SITE_OTHER): Payer: BLUE CROSS/BLUE SHIELD | Admitting: Family Medicine

## 2014-12-27 VITALS — BP 120/78 | HR 71 | Temp 97.7°F | Wt 187.0 lb

## 2014-12-27 DIAGNOSIS — E038 Other specified hypothyroidism: Secondary | ICD-10-CM | POA: Diagnosis not present

## 2014-12-27 DIAGNOSIS — F331 Major depressive disorder, recurrent, moderate: Secondary | ICD-10-CM

## 2014-12-27 MED ORDER — ESCITALOPRAM OXALATE 5 MG PO TABS: 5.0000 mg | ORAL_TABLET | Freq: Every day | ORAL | Status: DC

## 2014-12-27 NOTE — Progress Notes (Signed)
Pre visit review using our clinic review tool, if applicable. No additional management support is needed unless otherwise documented below in the visit note. 

## 2014-12-27 NOTE — Patient Instructions (Signed)
Call in 3-4 weeks if Lexapro not helping depression or  Anxiety at the low dose.

## 2014-12-27 NOTE — Progress Notes (Signed)
   Subjective:    Patient ID: Tiffany Montes, female    DOB: [birth year], 59 y.o.   MRN: [id number]  HPI  Patient seen for follow-up regarding recent complete physical. She had ongoing depressive symptoms and we had added Zoloft to her Wellbutrin. Even at the 25 mg dose she had some nausea and only took this for about 2 days and then discontinued. She is reluctant to consider going back on this. She's had some ongoing depression symptoms and remains on Wellbutrin. Recent TSH elevated 7.11 and we increased her medication. We have recommended follow-up in May to repeat levels. No suicidal ideation  Past Medical History  Diagnosis Date  . Thyroid disease     hypothyroid  . Hypertension   . Hyperlipidemia   . Gallstones 09/05/2011  . PONV (postoperative nausea and vomiting)   . Depression   . Attention deficit disorder   . Migraines   . Anxiety    Past Surgical History  Procedure Laterality Date  . Abdominal hysterectomy  2003  . Cystic hygroma excision    . Cholecystectomy  09/26/2011    Procedure: LAPAROSCOPIC CHOLECYSTECTOMY WITH INTRAOPERATIVE CHOLANGIOGRAM;  Surgeon: Haywood Lasso, MD;  Location: [facility];  Service: General;  Laterality: N/A;    reports that she has never smoked. She has never used smokeless tobacco. She reports that she drinks alcohol. She reports that she does not use illicit drugs. family history includes Cancer in her father; Diabetes in her father; Heart disease in her father. Allergies  Allergen Reactions  . Morphine Sulfate Nausea And Vomiting and Rash    REACTION: GI upset     Review of Systems  Constitutional: Negative for appetite change, fatigue and unexpected weight change.  Eyes: Negative for visual disturbance.  Respiratory: Negative for cough, chest tightness, shortness of breath and wheezing.   Cardiovascular: Negative for chest pain, palpitations and leg swelling.  Genitourinary: Negative for dysuria.  Neurological: Negative for  dizziness, seizures, syncope, weakness, light-headedness and headaches.       Objective:   Physical Exam  Constitutional: She appears well-developed and well-nourished. No distress.  Cardiovascular: Normal rate and regular rhythm.   Pulmonary/Chest: Effort normal and breath sounds normal.  Musculoskeletal: She exhibits no edema.  Psychiatric: She has a normal mood and affect. Her behavior is normal.          Assessment & Plan:  Recurrent depression. We've recommended continue Wellbutrin and try addition of low-dose Lexapro 5 mg once daily. Consider further titration to 10 mg daily in 3-4 weeks if tolerating well and not seeing improvement with depressive symptoms. Future order for repeat TSH in 2 months

## 2015-01-02 ENCOUNTER — Ambulatory Visit: Payer: BLUE CROSS/BLUE SHIELD | Admitting: Family Medicine

## 2015-01-11 ENCOUNTER — Telehealth: Payer: Self-pay | Admitting: Family Medicine

## 2015-01-11 NOTE — Telephone Encounter (Signed)
Increase to 10 mg and make sure follow up in one month.

## 2015-01-11 NOTE — Telephone Encounter (Signed)
Pt instructed to cb if she needed the escitalopram (LEXAPRO) 5 MG tablet increased, and she would like that  Increased and she does.   [facility] [address]

## 2015-01-12 MED ORDER — ESCITALOPRAM OXALATE 10 MG PO TABS: 10.0000 mg | ORAL_TABLET | Freq: Every day | ORAL | Status: DC

## 2015-01-12 NOTE — Telephone Encounter (Signed)
Left detailed message on Vm. Rx sent to pharmacy

## 2015-01-14 ENCOUNTER — Other Ambulatory Visit: Payer: Self-pay | Admitting: Family Medicine

## 2015-01-19 ENCOUNTER — Other Ambulatory Visit: Payer: Self-pay | Admitting: Obstetrics & Gynecology

## 2015-01-23 LAB — CYTOLOGY - PAP

## 2015-02-22 ENCOUNTER — Other Ambulatory Visit (INDEPENDENT_AMBULATORY_CARE_PROVIDER_SITE_OTHER): Payer: BLUE CROSS/BLUE SHIELD

## 2015-02-22 DIAGNOSIS — E038 Other specified hypothyroidism: Secondary | ICD-10-CM

## 2015-02-22 LAB — TSH: TSH: 4.43 u[IU]/mL (ref 0.35–4.50)

## 2015-02-23 ENCOUNTER — Telehealth: Payer: Self-pay | Admitting: Family Medicine

## 2015-02-23 DIAGNOSIS — F988 Other specified behavioral and emotional disorders with onset usually occurring in childhood and adolescence: Secondary | ICD-10-CM

## 2015-02-23 MED ORDER — AMPHETAMINE-DEXTROAMPHET ER 30 MG PO CP24: 30.0000 mg | ORAL_CAPSULE | ORAL | Status: DC

## 2015-02-23 NOTE — Telephone Encounter (Signed)
° ° ° ° ° °

## 2015-02-23 NOTE — Telephone Encounter (Signed)
Left message on Vm that RX is ready for pickup

## 2015-02-23 NOTE — Telephone Encounter (Signed)
Refills OK. 

## 2015-02-23 NOTE — Telephone Encounter (Signed)
Last visit 12/27/14 Last refill 11/30/14 #30 2 refill

## 2015-02-28 ENCOUNTER — Other Ambulatory Visit: Payer: Self-pay | Admitting: Family Medicine

## 2015-03-08 ENCOUNTER — Other Ambulatory Visit: Payer: Self-pay | Admitting: Family Medicine

## 2015-04-03 ENCOUNTER — Other Ambulatory Visit: Payer: Self-pay | Admitting: Family Medicine

## 2015-04-24 ENCOUNTER — Ambulatory Visit (INDEPENDENT_AMBULATORY_CARE_PROVIDER_SITE_OTHER): Payer: BLUE CROSS/BLUE SHIELD | Admitting: Family Medicine

## 2015-04-24 ENCOUNTER — Encounter: Payer: Self-pay | Admitting: Family Medicine

## 2015-04-24 VITALS — BP 128/80 | HR 92 | Temp 97.5°F | Wt 185.0 lb

## 2015-04-24 DIAGNOSIS — R058 Other specified cough: Secondary | ICD-10-CM

## 2015-04-24 DIAGNOSIS — R05 Cough: Secondary | ICD-10-CM | POA: Diagnosis not present

## 2015-04-24 MED ORDER — LOSARTAN POTASSIUM-HCTZ 50-12.5 MG PO TABS: 1.0000 | ORAL_TABLET | Freq: Every day | ORAL | Status: DC

## 2015-04-24 NOTE — Patient Instructions (Signed)
Please be in touch within 2 weeks if cough no better STOP the Lisinopril and start the Losartan-HCTZ.

## 2015-04-24 NOTE — Progress Notes (Signed)
   Subjective:    Patient ID: Tiffany Montes, female    DOB: Nov 06, 1955, 59 y.o.   MRN: [id number]  HPI One-month history of dry cough. Patient has never smoked. No history of asthma. Sometimes cough is worse at night and also possibly worse at work. She works at [employer] and is frequently around dust. She has occasional postnasal drip. She is taking Allegra regularly. No GERD symptoms. No wheezing. No appetite or weight changes. No hemoptysis. She does take ACE inhibitor with lisinopril HCTZ. Cough is very intermittent.  Past Medical History  Diagnosis Date  . Thyroid disease     hypothyroid  . Hypertension   . Hyperlipidemia   . Gallstones 09/05/2011  . PONV (postoperative nausea and vomiting)   . Depression   . Attention deficit disorder   . Migraines   . Anxiety    Past Surgical History  Procedure Laterality Date  . Abdominal hysterectomy  2003  . Cystic hygroma excision    . Cholecystectomy  09/26/2011    Procedure: LAPAROSCOPIC CHOLECYSTECTOMY WITH INTRAOPERATIVE CHOLANGIOGRAM;  Surgeon: Haywood Lasso, MD;  Location: [place];  Service: General;  Laterality: N/A;    reports that she has never smoked. She has never used smokeless tobacco. She reports that she drinks alcohol. She reports that she does not use illicit drugs. family history includes Cancer in her father; Diabetes in her father; Heart disease in her father. Allergies  Allergen Reactions  . Morphine Sulfate Nausea And Vomiting and Rash    REACTION: GI upset      Review of Systems  Constitutional: Negative for fever and chills.  HENT: Positive for postnasal drip. Negative for sinus pressure.   Respiratory: Positive for cough. Negative for shortness of breath and wheezing.   Cardiovascular: Negative for chest pain.       Objective:   Physical Exam  Constitutional: She appears well-developed and well-nourished.  HENT:  Right Ear: External ear normal.  Left Ear: External ear normal.  Mouth/Throat:  Oropharynx is clear and moist.  Neck: Neck supple.  Cardiovascular: Normal rate and regular rhythm.   Pulmonary/Chest: Effort normal and breath sounds normal. No respiratory distress. She has no wheezes. She has no rales.  Lymphadenopathy:    She has no cervical adenopathy.          Assessment & Plan:  Dry cough. May have some allergic postnasal drip component but also she is also on ACE inhibitor. Discontinue lisinopril HCTZ. Start losartan HCTZ. Touch base if cough not resolving within 2 weeks.

## 2015-04-24 NOTE — Progress Notes (Signed)
Pre visit review using our clinic review tool, if applicable. No additional management support is needed unless otherwise documented below in the visit note. 

## 2015-05-03 ENCOUNTER — Other Ambulatory Visit: Payer: Self-pay | Admitting: Family Medicine

## 2015-05-03 NOTE — Telephone Encounter (Signed)
Last visit 04/24/15 Last refill 04/05/15 #30 0 refill

## 2015-05-03 NOTE — Telephone Encounter (Signed)
Refill #30 with 5 refills. 

## 2015-05-08 ENCOUNTER — Telehealth: Payer: Self-pay | Admitting: Family Medicine

## 2015-05-08 ENCOUNTER — Other Ambulatory Visit: Payer: Self-pay | Admitting: Family Medicine

## 2015-05-08 DIAGNOSIS — R05 Cough: Secondary | ICD-10-CM

## 2015-05-08 DIAGNOSIS — R053 Chronic cough: Secondary | ICD-10-CM

## 2015-05-08 MED ORDER — MONTELUKAST SODIUM 10 MG PO TABS: 10.0000 mg | ORAL_TABLET | Freq: Every day | ORAL | Status: DC

## 2015-05-08 NOTE — Telephone Encounter (Signed)
Recommend:  CXR (dx persistent cough) Make sure she is using nasal steroid OTC such as Flonase or Nasacort Add Singulair 10 mg once daily Office follow up if no better 2 weeks.

## 2015-05-08 NOTE — Telephone Encounter (Signed)
Left message for patient to return call. Chest xray is ordered.

## 2015-05-08 NOTE — Telephone Encounter (Signed)
Ptn seen 7/18 and was to touch base if cough is no better.  Pt states she coughs all night and it is getting worse. Pt states this is a dry cough. Pt states it feels like she cannot clear her throat and she gets "strangled" It's all in the throat. pls advise   [facility]

## 2015-05-08 NOTE — Telephone Encounter (Signed)
Rx sent to pharmacy   

## 2015-05-08 NOTE — Addendum Note (Signed)
Addended by: Marcina Millard on: 05/08/2015 02:48 PM   Modules accepted: Orders, Medications

## 2015-05-08 NOTE — Telephone Encounter (Signed)
Pt  Needs the Singular, [facility]  [facility]  (just this one time, [place]

## 2015-05-08 NOTE — Telephone Encounter (Signed)
Patient is using flonase but will try Singular, and will get a chest x-ray.

## 2015-05-09 ENCOUNTER — Ambulatory Visit (INDEPENDENT_AMBULATORY_CARE_PROVIDER_SITE_OTHER)
Admission: RE | Admit: 2015-05-09 | Discharge: 2015-05-09 | Disposition: A | Discharge status: Home / Self Care | Payer: BLUE CROSS/BLUE SHIELD | Source: Ambulatory Visit | Attending: Family Medicine | Admitting: Family Medicine

## 2015-05-09 DIAGNOSIS — R05 Cough: Secondary | ICD-10-CM

## 2015-05-09 DIAGNOSIS — R053 Chronic cough: Secondary | ICD-10-CM

## 2015-05-24 ENCOUNTER — Ambulatory Visit (INDEPENDENT_AMBULATORY_CARE_PROVIDER_SITE_OTHER): Payer: BLUE CROSS/BLUE SHIELD | Admitting: Family Medicine

## 2015-05-24 ENCOUNTER — Encounter: Payer: Self-pay | Admitting: Family Medicine

## 2015-05-24 VITALS — BP 120/80 | HR 69 | Temp 97.8°F | Wt 188.0 lb

## 2015-05-24 DIAGNOSIS — M7712 Lateral epicondylitis, left elbow: Secondary | ICD-10-CM

## 2015-05-24 DIAGNOSIS — M25522 Pain in left elbow: Secondary | ICD-10-CM

## 2015-05-24 MED ORDER — METHYLPREDNISOLONE ACETATE 40 MG/ML IJ SUSP
40.0000 mg | Freq: Once | INTRAMUSCULAR | Status: AC
  Administered 2015-05-24: 40 mg via INTRA_ARTICULAR

## 2015-05-24 NOTE — Progress Notes (Signed)
Pre visit review using our clinic review tool, if applicable. No additional management support is needed unless otherwise documented below in the visit note. 

## 2015-05-24 NOTE — Progress Notes (Signed)
   Subjective:    Patient ID: Tiffany Montes, female    DOB: 09/17/1956, 59 y.o.   MRN: [id number]  HPI Acute visit. Bilateral elbow pain left greater than right. Right-hand dominant. 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 No injury. Pain is left lateral elbow region. Worse with gripping. Quality of pain as soreness. She has tried ice and Aleve with minimal relief. No cervical radiculopathy symptoms.  Past Medical History  Diagnosis Date  . Thyroid disease     hypothyroid  . Hypertension   . Hyperlipidemia   . Gallstones 09/05/2011  . PONV (postoperative nausea and vomiting)   . Depression   . Attention deficit disorder   . Migraines   . Anxiety    Past Surgical History  Procedure Laterality Date  . Abdominal hysterectomy  2003  . Cystic hygroma excision    . Cholecystectomy  09/26/2011    Procedure: LAPAROSCOPIC CHOLECYSTECTOMY WITH INTRAOPERATIVE CHOLANGIOGRAM;  Surgeon: Haywood Lasso, MD;  Location: [place];  Service: General;  Laterality: N/A;    reports that she has never smoked. She has never used smokeless tobacco. She reports that she drinks alcohol. She reports that she does not use illicit drugs. family history includes Cancer in her father; Diabetes in her father; Heart disease in her father. Allergies  Allergen Reactions  . Morphine Sulfate Nausea And Vomiting and Rash    REACTION: GI upset      Review of Systems  Neurological: Negative for weakness and numbness.       Objective:   Physical Exam  Constitutional: She appears well-developed and well-nourished.  Cardiovascular: Normal rate and regular rhythm.   Musculoskeletal:  Both elbows were examined. Full range of motion. No swelling. No warmth. No erythema. She has tenderness over the lateral epicondylar region bilaterally left greater than right. Pain with wrist extension against resistance at the elbow          Assessment & Plan:  Bilateral lateral epicondylitis left greater than  right. Continue icing and anti-inflammatory. Consider tennis elbow strap. We discussed risk and benefits of steroid injection and she consented. Left lateral epicondylar region prepped with Betadine. Using 1 inch 25-gauge needle 1/2 mL Depo-Medrol 1 mL plain Xylocaine injected into area tenderness. Patient tolerated well. Touch base 2-3 weeks if no improvement.

## 2015-05-26 ENCOUNTER — Telehealth: Payer: Self-pay | Admitting: Family Medicine

## 2015-05-26 DIAGNOSIS — F988 Other specified behavioral and emotional disorders with onset usually occurring in childhood and adolescence: Secondary | ICD-10-CM

## 2015-05-26 NOTE — Telephone Encounter (Signed)
Last visit 05/24/15 Last refill 02/23/15 #30 2 refill

## 2015-05-26 NOTE — Telephone Encounter (Signed)
° ° ° ° ° °

## 2015-05-28 NOTE — Telephone Encounter (Signed)
Refills OK. 

## 2015-05-29 MED ORDER — AMPHETAMINE-DEXTROAMPHET ER 30 MG PO CP24: 30.0000 mg | ORAL_CAPSULE | ORAL | Status: DC

## 2015-05-29 NOTE — Telephone Encounter (Signed)
Pt aware that Rx is ready for pick up 

## 2015-06-22 ENCOUNTER — Encounter: Payer: Self-pay | Admitting: Family Medicine

## 2015-06-22 ENCOUNTER — Ambulatory Visit (INDEPENDENT_AMBULATORY_CARE_PROVIDER_SITE_OTHER): Payer: BLUE CROSS/BLUE SHIELD | Admitting: Family Medicine

## 2015-06-22 VITALS — BP 130/84 | HR 85 | Temp 97.6°F | Ht 63.0 in | Wt 188.1 lb

## 2015-06-22 DIAGNOSIS — J209 Acute bronchitis, unspecified: Secondary | ICD-10-CM

## 2015-06-22 MED ORDER — AZITHROMYCIN 250 MG PO TABS: ORAL_TABLET | ORAL | Status: AC

## 2015-06-22 MED ORDER — BENZONATATE 200 MG PO CAPS: 200.0000 mg | ORAL_CAPSULE | Freq: Three times a day (TID) | ORAL | Status: DC | PRN

## 2015-06-22 NOTE — Progress Notes (Signed)
   Subjective:    Patient ID: Tiffany Montes, female    DOB: 09/18/1956, 59 y.o.   MRN: [id number]  HPI   Acute visit for cough. Duration over one week. Productive at times. No fevers or chills. She's had frequent sinus congestive symptoms mostly frontal sinus region. Mild sore throat. 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  Past Medical History  Diagnosis Date  . Thyroid disease     hypothyroid  . Hypertension   . Hyperlipidemia   . Gallstones 09/05/2011  . PONV (postoperative nausea and vomiting)   . Depression   . Attention deficit disorder   . Migraines   . Anxiety    Past Surgical History  Procedure Laterality Date  . Abdominal hysterectomy  2003  . Cystic hygroma excision    . Cholecystectomy  09/26/2011    Procedure: LAPAROSCOPIC CHOLECYSTECTOMY WITH INTRAOPERATIVE CHOLANGIOGRAM;  Surgeon: Haywood Lasso, MD;  Location: [place];  Service: General;  Laterality: N/A;    reports that she has never smoked. She has never used smokeless tobacco. She reports that she drinks alcohol. She reports that she does not use illicit drugs. family history includes Cancer in her father; Diabetes in her father; Heart disease in her father. Allergies  Allergen Reactions  . Morphine Sulfate Nausea And Vomiting and Rash    REACTION: GI upset      Review of Systems  Constitutional: Negative for fever and chills.  HENT: Positive for congestion and sinus pressure.   Respiratory: Positive for cough. Negative for shortness of breath.        Objective:   Physical Exam  Constitutional: She appears well-developed and well-nourished.  HENT:  Right Ear: External ear normal.  Left Ear: External ear normal.  Mouth/Throat: Oropharynx is clear and moist.  Neck: Neck supple. No thyromegaly present.  Cardiovascular: Normal rate and regular rhythm.   Pulmonary/Chest: Effort normal and breath sounds normal. No respiratory distress. She has no wheezes. She  has no rales.  Lymphadenopathy:    She has no cervical adenopathy.          Assessment & Plan:  Cough. Probable acute bronchitis. Possible sinusitis. Over-the-counter Mucinex and plenty of fluids. Start Zithromax if sinusitis symptoms progress. Tessalon Perles for cough as needed every 8 hours

## 2015-06-22 NOTE — Progress Notes (Signed)
Pre visit review using our clinic review tool, if applicable. No additional management support is needed unless otherwise documented below in the visit note. 

## 2015-06-22 NOTE — Patient Instructions (Signed)

## 2015-07-01 ENCOUNTER — Other Ambulatory Visit: Payer: Self-pay | Admitting: Family Medicine

## 2015-07-10 ENCOUNTER — Other Ambulatory Visit: Payer: Self-pay | Admitting: Family Medicine

## 2015-07-14 ENCOUNTER — Other Ambulatory Visit: Payer: Self-pay | Admitting: Family Medicine

## 2015-08-13 ENCOUNTER — Other Ambulatory Visit: Payer: Self-pay | Admitting: Family Medicine

## 2015-08-29 ENCOUNTER — Telehealth: Payer: Self-pay | Admitting: Family Medicine

## 2015-08-29 MED ORDER — AMPHETAMINE-DEXTROAMPHET ER 30 MG PO CP24: 30.0000 mg | ORAL_CAPSULE | ORAL | Status: DC

## 2015-08-29 MED ORDER — AMPHETAMINE-DEXTROAMPHET ER 30 MG PO CP24: 30.0000 mg | ORAL_CAPSULE | Freq: Every day | ORAL | Status: DC

## 2015-08-29 NOTE — Telephone Encounter (Signed)
Pt called saying she needs a refill of Adderall. Please call the pt if you have questions.  Pt ph# [phone] Thank you.

## 2015-08-29 NOTE — Telephone Encounter (Signed)
Pt is aware that RX is up front to be picked up.

## 2015-08-29 NOTE — Telephone Encounter (Signed)
Printed for Dr. Burchette to sign. 

## 2015-08-30 ENCOUNTER — Telehealth: Payer: Self-pay | Admitting: Family Medicine

## 2015-08-30 ENCOUNTER — Ambulatory Visit (INDEPENDENT_AMBULATORY_CARE_PROVIDER_SITE_OTHER): Payer: BLUE CROSS/BLUE SHIELD | Admitting: Family Medicine

## 2015-08-30 VITALS — BP 140/98 | HR 88 | Temp 98.1°F | Resp 16 | Ht 63.0 in | Wt 190.1 lb

## 2015-08-30 DIAGNOSIS — M546 Pain in thoracic spine: Secondary | ICD-10-CM | POA: Diagnosis not present

## 2015-08-30 MED ORDER — CYCLOBENZAPRINE HCL 10 MG PO TABS: 10.0000 mg | ORAL_TABLET | Freq: Three times a day (TID) | ORAL | Status: DC | PRN

## 2015-08-30 NOTE — Telephone Encounter (Signed)
Appointment made today for 1:45

## 2015-08-30 NOTE — Telephone Encounter (Signed)
Pt has been sch

## 2015-08-30 NOTE — Telephone Encounter (Signed)
Pt is having left side pain around back area and would like to see dr burchette today. Can I open slot up?

## 2015-08-30 NOTE — Progress Notes (Signed)
   Subjective:    Patient ID: Tiffany Montes, female    DOB: 01/17/1956, 59 y.o.   MRN: [id number]  HPI Patient seen with left thoracic back pain. Onset earlier this week. On Monday she was lifting a mattress by herself felt some pain and then the pain has progressed through the week. She has dull ache which is worse with movement. Radiates around the left thoracic side. She's tried ice ibuprofen and heat which helped temporarily. She had difficulty sleeping last night secondary to pain. She feels this tightens up at times. No rash. No radiculopathy pain. No low back pain. Severity is moderate  Past Medical History  Diagnosis Date  . Thyroid disease     hypothyroid  . Hypertension   . Hyperlipidemia   . Gallstones 09/05/2011  . PONV (postoperative nausea and vomiting)   . Depression   . Attention deficit disorder   . Migraines   . Anxiety    Past Surgical History  Procedure Laterality Date  . Abdominal hysterectomy  2003  . Cystic hygroma excision    . Cholecystectomy  09/26/2011    Procedure: LAPAROSCOPIC CHOLECYSTECTOMY WITH INTRAOPERATIVE CHOLANGIOGRAM;  Surgeon: Haywood Lasso, MD;  Location: [place];  Service: General;  Laterality: N/A;    reports that she has never smoked. She has never used smokeless tobacco. She reports that she drinks alcohol. She reports that she does not use illicit drugs. family history includes Cancer in her father; Diabetes in her father; Heart disease in her father. Allergies  Allergen Reactions  . Morphine Sulfate Nausea And Vomiting and Rash    REACTION: GI upset      Review of Systems  Respiratory: Negative for shortness of breath.   Cardiovascular: Negative for chest pain.  Gastrointestinal: Negative for abdominal pain.  Musculoskeletal: Positive for back pain.  Skin: Negative for rash.       Objective:   Physical Exam  Constitutional: She appears well-developed and well-nourished.  Cardiovascular: Normal rate and regular  rhythm.  Exam reveals no gallop.   Pulmonary/Chest: Effort normal and breath sounds normal. No respiratory distress. She has no wheezes. She has no rales.  Musculoskeletal: She exhibits no edema.  No thoracic spine tenderness. She has some muscular tenderness left midthoracic region  Skin: No rash noted.          Assessment & Plan:   Left thoracic back pain. Suspect muscular. Continue heat or ice along with ibuprofen. Short-term use of Flexeril 10 mg daily at bedtime. Cautioned about potential sedation. Touch base next week if not improving

## 2015-08-30 NOTE — Telephone Encounter (Signed)
Please advise 

## 2015-08-30 NOTE — Patient Instructions (Signed)
Caution with Flexeril - may cause sedation. Continue with heat or ice for symptom relief. Continue Ibuprofen as needed.

## 2015-08-30 NOTE — Progress Notes (Signed)
Pre visit review using our clinic review tool, if applicable. No additional management support is needed unless otherwise documented below in the visit note. 

## 2015-08-30 NOTE — Telephone Encounter (Signed)
yes

## 2015-08-30 NOTE — Telephone Encounter (Signed)
Tiffany Montes is aware to schedule appointment

## 2015-09-04 ENCOUNTER — Telehealth: Payer: Self-pay | Admitting: *Deleted

## 2015-09-04 ENCOUNTER — Other Ambulatory Visit: Payer: Self-pay | Admitting: Family Medicine

## 2015-09-04 MED ORDER — MELOXICAM 15 MG PO TABS: 15.0000 mg | ORAL_TABLET | Freq: Every day | ORAL | Status: DC

## 2015-09-04 NOTE — Telephone Encounter (Signed)
Patient seen last week. Was advised to touch base if symptoms were not better. See below triage note. Let me know if want to call something else in or schedule appointment   --------------------------------------------------------------------------------------------------------------------------------------------------------------------------------------------------------- PLEASE NOTE: All timestamps contained within this report are represented as [place] Standard Time. CONFIDENTIALTY NOTICE: This fax transmission is intended only for the addressee. It contains information that is legally privileged, confidential or otherwise protected from use or disclosure. If you are not the intended recipient, you are strictly prohibited from reviewing, disclosing, copying using or disseminating any of this information or taking any action in reliance on or regarding this information. If you have received this fax in error, please notify us immediately by telephone so that we can arrange for its return to [place]. Phone: [phone], Toll-Free: [phone], Fax: [phone] Page: 1 of 2 Call Id: [id number] [facility] Night - Client [place] Patient Name: BEEBE COUNCE Gender: Female DOB: Dec 07, 1955 Age: 59 Y 81 M 20 D Return Phone Number: [phone] (Primary) Address: City/State/Zip: [place] Client [facility] - Client Client Site [facility] - Night Physician Carolann Littler Contact Type Call Call Type Triage / Clinical Caller Name Shikara Relationship To Patient Self Return Phone Number [phone] (Primary) Chief Complaint Back Injury Initial Comment Caller states she has hurt her back and wants something for pain. PreDisposition Go to Urgent Care/Walk-In Clinic Nurse Assessment Nurse: Vevelyn Royals, RN, Verdis Frederickson Date/Time Eilene Ghazi Time): 09/02/2015 11:14:15 AM Confirm and document reason for  call. If symptomatic, describe symptoms. ---Caller states she has hurt her back and wants something for pain. Hurt back Tuesday flipping a mattress and went to doctor on Wednesday. Gave prescription for muscle relaxer. Also told she could take up to 4 Advil at one time. Very difficult to sleep. Has the patient traveled out of the country within the last 30 days? ---No Does the patient have any new or worsening symptoms? ---Yes Will a triage be completed? ---Yes Related visit to physician within the last 2 weeks? ---Yes Does the PT have any chronic conditions? (i.e. diabetes, asthma, etc.) ---No Is this a behavioral health call? ---No Guidelines Guideline Title Affirmed Question Affirmed Notes Nurse Date/Time (Eastern Time) Back Pain [1] SEVERE back pain (e.g., excruciating, unable to do any normal activities) AND [2] not improved 2 hours after pain medicine Vevelyn Royals, RN, Verdis Frederickson 09/02/2015 11:16:58 AM Disp. Time Eilene Ghazi Time) Disposition Final User 09/02/2015 11:24:35 AM See Physician within 4 Hours (or PCP triage) Yes Vevelyn Royals, RN, Feliz Beam NOTE: All timestamps contained within this report are represented as [place] Standard Time. CONFIDENTIALTY NOTICE: This fax transmission is intended only for the addressee. It contains information that is legally privileged, confidential or otherwise protected from use or disclosure. If you are not the intended recipient, you are strictly prohibited from reviewing, disclosing, copying using or disseminating any of this information or taking any action in reliance on or regarding this information. If you have received this fax in error, please notify us immediately by telephone so that we can arrange for its return to [place]. Phone: [phone], Toll-Free: [phone], Fax: [phone] Page: 2 of 2 Call Id: [id number] Caller Understands: Yes Disagree/Comply: Comply Care Advice Given Per Guideline SEE PHYSICIAN WITHIN 4 HOURS (or PCP triage): * IF  OFFICE WILL BE OPEN: You need to be seen within the next 3 or 4 hours. Call your doctor's office now or as soon as it opens. * IF OFFICE WILL BE CLOSED AND NO  PCP TRIAGE: You need to be seen within the next 3 or 4 hours. A nearby Urgent Care Center is often a good source of care. Another choice is to go to the ER. Go sooner if you become worse. * Before taking any medicine, read all the instructions on the package. CALL BACK IF: CAUTION - NSAIDS (E.G., IBUPROFEN, NAPROXEN): * Do not take nonsteroidal anti-inflammatory drugs (NSAIDs) if you have stomach problems, kidney disease, heart failure, or other contraindications to using this type of medication. * Do not take NSAID medications for over 7 days without consulting your PCP. * You may take this medicine with or without food. Taking it with food or milk may lessen the chance the drug will upset your stomach. * GASTROINTESTINAL RISK: There is an increased risk of stomach ulcers, GI bleeding, perforation. * CARDIOVASCULAR RISK: There may be an increased risk of heart attack and stroke. PAIN MEDICINES: * For pain relief, take acetaminophen, ibuprofen, or naproxen. * Use the lowest amount that makes your pain feel better. IBUPROFEN (E.G., MOTRIN, ADVIL): * Take 400 mg (two 200 mg pills) by mouth every 6 hours as needed. * Another choice is to take 600 mg (three 200 mg pills) by mouth every 8 hours as needed. * The most you should take each day is 1,200 mg (six 200 mg pills a day), unless your doctor has told you to take more. * You become worse. CARE ADVICE given per Back Pain (Adult) guideline. After Care Instructions Given Call Event Type User Date / Time Description Comments User: Noah Delaine, RN Date/Time Eilene Ghazi Time): 09/02/2015 11:19:54 AM Caller states muscle relaxers and Advil x 4 not working, pain is severe. Referrals [facility]

## 2015-09-04 NOTE — Telephone Encounter (Signed)
[facility] [address] Left message for patient to call back to make aware.

## 2015-09-04 NOTE — Telephone Encounter (Signed)
Please review

## 2015-09-04 NOTE — Telephone Encounter (Signed)
Let's try meloxicam 15 mg once daily #30 with no refills And avoid concomitant use of other nonsteroidal such as Advil or Aleve

## 2015-09-06 NOTE — Telephone Encounter (Signed)
Left another message for patient to call back 

## 2015-09-06 NOTE — Telephone Encounter (Signed)
Patient is aware 

## 2015-10-07 ENCOUNTER — Other Ambulatory Visit: Payer: Self-pay | Admitting: Family Medicine

## 2015-10-11 ENCOUNTER — Other Ambulatory Visit: Payer: Self-pay | Admitting: Family Medicine

## 2015-10-17 ENCOUNTER — Other Ambulatory Visit: Payer: Self-pay | Admitting: Family Medicine

## 2015-10-17 NOTE — Telephone Encounter (Signed)
Refill for 6 months.  Would make sure she has follow up within 6 months.

## 2015-10-17 NOTE — Telephone Encounter (Signed)
Ok to refill this medication? Pt was last seen 08/30/15 - no upcoming appts.

## 2015-11-16 ENCOUNTER — Other Ambulatory Visit: Payer: Self-pay

## 2015-11-16 ENCOUNTER — Telehealth: Payer: Self-pay | Admitting: Family Medicine

## 2015-11-16 DIAGNOSIS — Z1231 Encounter for screening mammogram for malignant neoplasm of breast: Secondary | ICD-10-CM

## 2015-11-16 NOTE — Telephone Encounter (Signed)
Pt request refill of the following:      BENZONATATE   [facility]  [address]

## 2015-11-17 MED ORDER — BENZONATATE 200 MG PO CAPS: ORAL_CAPSULE | ORAL | Status: DC

## 2015-11-17 NOTE — Telephone Encounter (Signed)
Medication sent in for patient due to allergy flare ups.

## 2015-11-24 ENCOUNTER — Other Ambulatory Visit: Payer: Self-pay | Admitting: Family Medicine

## 2015-11-29 ENCOUNTER — Other Ambulatory Visit (INDEPENDENT_AMBULATORY_CARE_PROVIDER_SITE_OTHER): Payer: BLUE CROSS/BLUE SHIELD

## 2015-11-29 DIAGNOSIS — Z0000 Encounter for general adult medical examination without abnormal findings: Secondary | ICD-10-CM | POA: Diagnosis not present

## 2015-11-29 LAB — LIPID PANEL
CHOL/HDL RATIO: 4
Cholesterol: 237 mg/dL — ABNORMAL HIGH (ref 0–200)
HDL: 66.3 mg/dL (ref >39.00)
LDL CALC: 152 mg/dL — AB (ref 0–99)
NonHDL: 170.32
TRIGLYCERIDES: 93 mg/dL (ref 0.0–149.0)
VLDL: 18.6 mg/dL (ref 0.0–40.0)

## 2015-11-29 LAB — CBC WITH DIFFERENTIAL/PLATELET
BASOS ABS: 0 10*3/uL (ref 0.0–0.1)
Basophils Relative: 0.6 % (ref 0.0–3.0)
EOS ABS: 0.1 10*3/uL (ref 0.0–0.7)
Eosinophils Relative: 1.6 % (ref 0.0–5.0)
HEMATOCRIT: 42.1 % (ref 36.0–46.0)
Hemoglobin: 14.2 g/dL (ref 12.0–15.0)
LYMPHS ABS: 1.8 10*3/uL (ref 0.7–4.0)
LYMPHS PCT: 30.9 % (ref 12.0–46.0)
MCHC: 33.7 g/dL (ref 30.0–36.0)
MCV: 86.5 fl (ref 78.0–100.0)
Monocytes Absolute: 0.5 10*3/uL (ref 0.1–1.0)
Monocytes Relative: 8 % (ref 3.0–12.0)
NEUTROS ABS: 3.3 10*3/uL (ref 1.4–7.7)
NEUTROS PCT: 58.9 % (ref 43.0–77.0)
PLATELETS: 229 10*3/uL (ref 150.0–400.0)
RBC: 4.86 Mil/uL (ref 3.87–5.11)
RDW: 13.3 % (ref 11.5–15.5)
WBC: 5.7 10*3/uL (ref 4.0–10.5)

## 2015-11-29 LAB — HEPATIC FUNCTION PANEL
ALK PHOS: 56 U/L (ref 39–117)
ALT: 13 U/L (ref 0–35)
AST: 14 U/L (ref 0–37)
Albumin: 4 g/dL (ref 3.5–5.2)
BILIRUBIN DIRECT: 0.1 mg/dL (ref 0.0–0.3)
Total Bilirubin: 0.5 mg/dL (ref 0.2–1.2)
Total Protein: 6.5 g/dL (ref 6.0–8.3)

## 2015-11-29 LAB — BASIC METABOLIC PANEL
BUN: 19 mg/dL (ref 6–23)
CHLORIDE: 104 meq/L (ref 96–112)
CO2: 27 meq/L (ref 19–32)
CREATININE: 0.77 mg/dL (ref 0.40–1.20)
Calcium: 9 mg/dL (ref 8.4–10.5)
GFR: 81.05 mL/min (ref >60.00)
Glucose, Bld: 116 mg/dL — ABNORMAL HIGH (ref 70–99)
Potassium: 4 mEq/L (ref 3.5–5.1)
SODIUM: 139 meq/L (ref 135–145)

## 2015-11-29 LAB — TSH: TSH: 7.8 u[IU]/mL — ABNORMAL HIGH (ref 0.35–4.50)

## 2015-12-04 ENCOUNTER — Other Ambulatory Visit: Payer: Self-pay

## 2015-12-04 ENCOUNTER — Ambulatory Visit (INDEPENDENT_AMBULATORY_CARE_PROVIDER_SITE_OTHER): Payer: BLUE CROSS/BLUE SHIELD | Admitting: Family Medicine

## 2015-12-04 ENCOUNTER — Encounter: Payer: Self-pay | Admitting: Family Medicine

## 2015-12-04 ENCOUNTER — Other Ambulatory Visit: Payer: Self-pay | Admitting: Family Medicine

## 2015-12-04 VITALS — BP 140/78 | HR 91 | Temp 97.5°F | Ht 63.0 in | Wt 196.5 lb

## 2015-12-04 DIAGNOSIS — I10 Essential (primary) hypertension: Secondary | ICD-10-CM

## 2015-12-04 DIAGNOSIS — Z23 Encounter for immunization: Secondary | ICD-10-CM

## 2015-12-04 DIAGNOSIS — E669 Obesity, unspecified: Secondary | ICD-10-CM | POA: Insufficient documentation

## 2015-12-04 DIAGNOSIS — E039 Hypothyroidism, unspecified: Secondary | ICD-10-CM

## 2015-12-04 DIAGNOSIS — R739 Hyperglycemia, unspecified: Secondary | ICD-10-CM

## 2015-12-04 DIAGNOSIS — Z0000 Encounter for general adult medical examination without abnormal findings: Secondary | ICD-10-CM

## 2015-12-04 DIAGNOSIS — E785 Hyperlipidemia, unspecified: Secondary | ICD-10-CM

## 2015-12-04 MED ORDER — AMPHETAMINE-DEXTROAMPHET ER 30 MG PO CP24: 30.0000 mg | ORAL_CAPSULE | Freq: Every day | ORAL | Status: DC

## 2015-12-04 MED ORDER — LEVOTHYROXINE SODIUM 125 MCG PO TABS: 125.0000 ug | ORAL_TABLET | Freq: Every day | ORAL | Status: DC

## 2015-12-04 NOTE — Progress Notes (Signed)
Subjective:    Patient ID: Tiffany Montes, female    DOB: 1956/01/08, 60 y.o.   MRN: [id number]  HPI Patient seen for physical exam. She sees gynecologist regularly. She's had gradual weight gain in recent years and is frustrated regarding that. She thinks a lot of her overeating is out of stress. She tends not to eat a lot earlier today and eats a lot at night when she goes home each day.  Hypothyroidism currently on levothyroxine 112 mg daily. Takes her medication regularly but does frequently take this with other medications and not on empty stomach. Long history of recurrent depression currently stable on Wellbutrin and Lexapro. History of attention deficit disorder. Hypertension treated with losartan HCTZ.  Mammograms up-to-date. She gets Pap smears through GYN. Colonoscopy up-to-date. No history of shingles vaccine. No contraindications. She is interested in getting this today if possible.  Past Medical History  Diagnosis Date  . Thyroid disease     hypothyroid  . Hypertension   . Hyperlipidemia   . Gallstones 09/05/2011  . PONV (postoperative nausea and vomiting)   . Depression   . Attention deficit disorder   . Migraines   . Anxiety    Past Surgical History  Procedure Laterality Date  . Abdominal hysterectomy  2003  . Cystic hygroma excision    . Cholecystectomy  09/26/2011    Procedure: LAPAROSCOPIC CHOLECYSTECTOMY WITH INTRAOPERATIVE CHOLANGIOGRAM;  Surgeon: Haywood Lasso, MD;  Location: [place];  Service: General;  Laterality: N/A;    reports that she has never smoked. She has never used smokeless tobacco. She reports that she drinks alcohol. She reports that she does not use illicit drugs. family history includes Cancer in her father; Diabetes in her father; Heart disease in her father. Allergies  Allergen Reactions  . Morphine Sulfate Nausea And Vomiting and Rash    REACTION: GI upset      Review of Systems  Constitutional: Positive for fatigue.  Negative for fever, activity change, appetite change and unexpected weight change.  HENT: Negative for ear pain, hearing loss, sore throat and trouble swallowing.   Eyes: Negative for visual disturbance.  Respiratory: Negative for cough and shortness of breath.   Cardiovascular: Negative for chest pain and palpitations.  Gastrointestinal: Negative for abdominal pain, diarrhea, constipation and blood in stool.  Endocrine: Negative for polydipsia and polyuria.  Genitourinary: Negative for dysuria and hematuria.  Musculoskeletal: Positive for arthralgias. Negative for myalgias and back pain.  Skin: Negative for rash.  Neurological: Negative for dizziness, syncope and headaches.  Hematological: Negative for adenopathy.  Psychiatric/Behavioral: Negative for confusion and dysphoric mood.       Objective:   Physical Exam  Constitutional: She is oriented to person, place, and time. She appears well-developed and well-nourished.  HENT:  Head: Normocephalic and atraumatic.  Eyes: EOM are normal. Pupils are equal, round, and reactive to light.  Neck: Normal range of motion. Neck supple. No thyromegaly present.  Cardiovascular: Normal rate, regular rhythm and normal heart sounds.   No murmur heard. Pulmonary/Chest: Breath sounds normal. No respiratory distress. She has no wheezes. She has no rales.  Abdominal: Soft. Bowel sounds are normal. She exhibits no distension and no mass. There is no tenderness. There is no rebound and no guarding.  Genitourinary:  Per gyn  Musculoskeletal: Normal range of motion. She exhibits no edema.  Lymphadenopathy:    She has no cervical adenopathy.  Neurological: She is alert and oriented to person, place, and time. She displays normal  reflexes. No cranial nerve deficit.  Skin: No rash noted.  Psychiatric: She has a normal mood and affect. Her behavior is normal. Judgment and thought content normal.          Assessment & Plan:  Physical exam. Labs  reviewed. Elevated TSH. We reviewed for her how to take her levothyroxine properly on empty stomach. We'll increase her to 125 mgs once daily and recheck TSH in 3 months. We discussed referral to nutritionist regarding her obesity. She has comorbidities of hypertension, dyslipidemia, and hyperglycemia. Shingles vaccine given. She will continue with GYN follow-up.

## 2015-12-04 NOTE — Addendum Note (Signed)
Addended by: Sandria Bales B on: 12/04/2015 11:54 AM   Modules accepted: Orders, SmartSet

## 2015-12-04 NOTE — Addendum Note (Signed)
Addended by: Sandria Bales B on: 12/04/2015 11:23 AM   Modules accepted: Miquel Dunn

## 2015-12-04 NOTE — Patient Instructions (Signed)
Remember to follow up in 3 month to repeat Thyroid Try to take thyroid medication on empty stomach as much as possible.

## 2015-12-12 ENCOUNTER — Ambulatory Visit: Payer: BLUE CROSS/BLUE SHIELD

## 2015-12-25 ENCOUNTER — Ambulatory Visit
Admission: RE | Admit: 2015-12-25 | Discharge: 2015-12-25 | Disposition: A | Discharge status: Home / Self Care | Payer: BLUE CROSS/BLUE SHIELD | Source: Ambulatory Visit

## 2015-12-25 DIAGNOSIS — Z1231 Encounter for screening mammogram for malignant neoplasm of breast: Secondary | ICD-10-CM

## 2015-12-27 ENCOUNTER — Other Ambulatory Visit: Payer: Self-pay | Admitting: Obstetrics & Gynecology

## 2015-12-27 DIAGNOSIS — R928 Other abnormal and inconclusive findings on diagnostic imaging of breast: Secondary | ICD-10-CM

## 2015-12-28 ENCOUNTER — Other Ambulatory Visit: Payer: Self-pay | Admitting: Obstetrics & Gynecology

## 2015-12-28 DIAGNOSIS — R928 Other abnormal and inconclusive findings on diagnostic imaging of breast: Secondary | ICD-10-CM

## 2016-01-03 ENCOUNTER — Other Ambulatory Visit: Payer: Self-pay | Admitting: Family Medicine

## 2016-01-04 ENCOUNTER — Ambulatory Visit
Admission: RE | Admit: 2016-01-04 | Discharge: 2016-01-04 | Disposition: A | Discharge status: Home / Self Care | Payer: BLUE CROSS/BLUE SHIELD | Source: Ambulatory Visit | Attending: Obstetrics & Gynecology | Admitting: Obstetrics & Gynecology

## 2016-01-04 DIAGNOSIS — R928 Other abnormal and inconclusive findings on diagnostic imaging of breast: Secondary | ICD-10-CM

## 2016-01-12 ENCOUNTER — Other Ambulatory Visit: Payer: Self-pay | Admitting: Family Medicine

## 2016-02-06 ENCOUNTER — Other Ambulatory Visit: Payer: Self-pay | Admitting: Family Medicine

## 2016-02-07 ENCOUNTER — Telehealth: Payer: Self-pay | Admitting: Family Medicine

## 2016-02-07 NOTE — Telephone Encounter (Signed)
Pt request refill  °amphetamine-dextroamphetamine (ADDERALL XR) 30 MG 24 hr capsule °

## 2016-02-07 NOTE — Telephone Encounter (Signed)
I only see ONE script on pts medication list printed on 12/04/15. Then Iook at her HISTORY and 3 scripts were printed with post dates. Pt is going to check with her pharmacy.

## 2016-02-12 ENCOUNTER — Other Ambulatory Visit: Payer: Self-pay | Admitting: Family Medicine

## 2016-02-17 DIAGNOSIS — H6690 Otitis media, unspecified, unspecified ear: Secondary | ICD-10-CM | POA: Diagnosis not present

## 2016-02-17 DIAGNOSIS — J309 Allergic rhinitis, unspecified: Secondary | ICD-10-CM | POA: Diagnosis not present

## 2016-02-27 ENCOUNTER — Other Ambulatory Visit (INDEPENDENT_AMBULATORY_CARE_PROVIDER_SITE_OTHER): Payer: BLUE CROSS/BLUE SHIELD

## 2016-02-27 ENCOUNTER — Telehealth: Payer: Self-pay | Admitting: Family Medicine

## 2016-02-27 DIAGNOSIS — E039 Hypothyroidism, unspecified: Secondary | ICD-10-CM | POA: Diagnosis not present

## 2016-02-27 LAB — TSH: TSH: 1.24 u[IU]/mL (ref 0.35–4.50)

## 2016-02-27 MED ORDER — LEVOTHYROXINE SODIUM 125 MCG PO TABS: 125.0000 ug | ORAL_TABLET | Freq: Every day | ORAL | Status: DC

## 2016-02-27 NOTE — Telephone Encounter (Signed)
Appt has been cancelled.  

## 2016-02-27 NOTE — Telephone Encounter (Signed)
Patient was here today for TSH labs and she was wondering why she needing to vome back next week? I explained to her that it is from her February appointment to follow up on her thyroids. She didn't have time stay for an answer and was wondering if she can cancel the appointment.

## 2016-02-27 NOTE — Telephone Encounter (Signed)
Patient needs a refill on her levothyroxine (SYNTHROID, LEVOTHROID) 125 MCG tablet sent to: [id number] - [place], [place] [phone] (Phone) [phone] (Fax)

## 2016-02-27 NOTE — Telephone Encounter (Signed)
Medication sent in for patient. 

## 2016-03-05 ENCOUNTER — Ambulatory Visit: Payer: BLUE CROSS/BLUE SHIELD | Admitting: Family Medicine

## 2016-03-12 ENCOUNTER — Telehealth: Payer: Self-pay | Admitting: Family Medicine

## 2016-03-12 MED ORDER — AMPHETAMINE-DEXTROAMPHET ER 30 MG PO CP24: 30.0000 mg | ORAL_CAPSULE | Freq: Every day | ORAL | Status: DC

## 2016-03-12 NOTE — Telephone Encounter (Signed)
Pt is aware that RX is up front for pick up. 

## 2016-03-12 NOTE — Telephone Encounter (Signed)
Last OV 12/04/2015 Last refill 12/04/2015 #30 Please advise

## 2016-03-12 NOTE — Telephone Encounter (Signed)
Refill OK

## 2016-03-12 NOTE — Telephone Encounter (Signed)
Pt needs new adderall xr 30 mg

## 2016-03-12 NOTE — Telephone Encounter (Signed)
RX printed for patient

## 2016-03-13 ENCOUNTER — Other Ambulatory Visit: Payer: Self-pay | Admitting: Family Medicine

## 2016-03-13 MED ORDER — AMPHETAMINE-DEXTROAMPHET ER 30 MG PO CP24: 30.0000 mg | ORAL_CAPSULE | Freq: Every day | ORAL | Status: DC

## 2016-03-19 ENCOUNTER — Other Ambulatory Visit: Payer: Self-pay | Admitting: Family Medicine

## 2016-04-02 DIAGNOSIS — Z6834 Body mass index (BMI) 34.0-34.9, adult: Secondary | ICD-10-CM | POA: Diagnosis not present

## 2016-04-02 DIAGNOSIS — Z01419 Encounter for gynecological examination (general) (routine) without abnormal findings: Secondary | ICD-10-CM | POA: Diagnosis not present

## 2016-04-12 ENCOUNTER — Other Ambulatory Visit: Payer: Self-pay | Admitting: Family Medicine

## 2016-04-16 DIAGNOSIS — H35033 Hypertensive retinopathy, bilateral: Secondary | ICD-10-CM | POA: Diagnosis not present

## 2016-04-17 ENCOUNTER — Other Ambulatory Visit: Payer: Self-pay | Admitting: Family Medicine

## 2016-04-17 MED ORDER — LOSARTAN POTASSIUM-HCTZ 50-12.5 MG PO TABS: 1.0000 | ORAL_TABLET | Freq: Every day | ORAL | Status: DC

## 2016-04-29 ENCOUNTER — Other Ambulatory Visit: Payer: Self-pay | Admitting: *Deleted

## 2016-04-29 MED ORDER — SUMATRIPTAN SUCCINATE 100 MG PO TABS: ORAL_TABLET | ORAL | 0 refills | Status: DC

## 2016-05-19 ENCOUNTER — Other Ambulatory Visit: Payer: Self-pay | Admitting: Family Medicine

## 2016-05-22 ENCOUNTER — Other Ambulatory Visit: Payer: Self-pay | Admitting: Family Medicine

## 2016-06-07 ENCOUNTER — Telehealth: Payer: Self-pay | Admitting: Family Medicine

## 2016-06-07 NOTE — Telephone Encounter (Signed)
Pr needs new rx generic adderall xr 30 mg. Pt will need by tuesday

## 2016-06-07 NOTE — Telephone Encounter (Signed)
Last filled 03/13/16 # 30 refills 0  Filled for 3 months  Last OV 12/04/15   Okay to refill???

## 2016-06-08 NOTE — Telephone Encounter (Signed)
Refill for 3 months. 

## 2016-06-11 MED ORDER — AMPHETAMINE-DEXTROAMPHET ER 30 MG PO CP24: 30.0000 mg | ORAL_CAPSULE | Freq: Every day | ORAL | 0 refills | Status: DC

## 2016-06-11 NOTE — Telephone Encounter (Signed)
Rx up front and left message on patients cell phone to pick up.

## 2016-06-14 ENCOUNTER — Encounter: Payer: Self-pay | Admitting: Family Medicine

## 2016-06-14 ENCOUNTER — Ambulatory Visit (INDEPENDENT_AMBULATORY_CARE_PROVIDER_SITE_OTHER): Payer: BLUE CROSS/BLUE SHIELD | Admitting: Family Medicine

## 2016-06-14 VITALS — BP 140/100 | HR 98 | Temp 98.0°F | Ht 63.0 in | Wt 198.0 lb

## 2016-06-14 DIAGNOSIS — M25552 Pain in left hip: Secondary | ICD-10-CM

## 2016-06-14 DIAGNOSIS — M7072 Other bursitis of hip, left hip: Secondary | ICD-10-CM | POA: Diagnosis not present

## 2016-06-14 MED ORDER — METHYLPREDNISOLONE ACETATE 80 MG/ML IJ SUSP
80.0000 mg | Freq: Once | INTRAMUSCULAR | Status: AC
  Administered 2016-06-14: 80 mg via INTRAMUSCULAR

## 2016-06-14 NOTE — Progress Notes (Signed)
Subjective:     Patient ID: Tiffany Montes, female   DOB: Apr 07, 1956, 60 y.o.   MRN: [id number]  HPI Left lateral hip pain Duration about 2 months. No injury. Does have occasional low back pain but no clear radiculopathy symptoms. She's tried heat without improvement. She has pain after prolonged period of sitting and also sometimes with ambulation. Does not have any anterior hip pain or medial groin pain. Denies any lower extremity numbness or weakness. Pain is moderate at times. No alleviating factors.  Past Medical History:  Diagnosis Date  . Anxiety   . Attention deficit disorder   . Depression   . Gallstones 09/05/2011  . Hyperlipidemia   . Hypertension   . Migraines   . PONV (postoperative nausea and vomiting)   . Thyroid disease    hypothyroid   Past Surgical History:  Procedure Laterality Date  . ABDOMINAL HYSTERECTOMY  2003  . CHOLECYSTECTOMY  09/26/2011   Procedure: LAPAROSCOPIC CHOLECYSTECTOMY WITH INTRAOPERATIVE CHOLANGIOGRAM;  Surgeon: Haywood Lasso, MD;  Location: [facility];  Service: General;  Laterality: N/A;  . CYSTIC HYGROMA EXCISION      reports that she has never smoked. She has never used smokeless tobacco. She reports that she drinks alcohol. She reports that she does not use drugs. family history includes Cancer in her father; Diabetes in her father; Heart disease in her father. Allergies  Allergen Reactions  . Morphine Sulfate Nausea And Vomiting and Rash    REACTION: GI upset     Review of Systems  Gastrointestinal: Negative for abdominal pain.  Genitourinary: Negative for dysuria.  Skin: Negative for rash.  Neurological: Negative for weakness and numbness.  Hematological: Negative for adenopathy.       Objective:   Physical Exam  Constitutional: She appears well-developed and well-nourished.  Cardiovascular: Normal rate and regular rhythm.   Pulmonary/Chest: Effort normal and breath sounds normal. No respiratory distress. She has no  wheezes. She has no rales.  Musculoskeletal:  Full range of motion left hip. She has tenderness over the greater trochanteric bursa region. Straight leg raise is negative. Deep tendon reflexes symmetric knee and ankle bilaterally  Neurological:  Full-strength lower extremity throughout       Assessment:     -Bursitis left hip     Plan:     -Recommend she try some icing 15-20 minutes 2-3 times daily -We discussed risk and benefits of steroid injection of left lateral greater trochanteric bursa region. We discussed risk of bruising, bleeding, and infection. Patient consented. Skin prepped with Betadine. Using 1-1/2 inch 25-gauge needle injected 2 mL of plain Xylocaine and 1 mL of Depo-Medrol. Pt tolerated well. -Touch base in 2 weeks if not further improved  Eulas Post MD [facility] at [facility]

## 2016-06-14 NOTE — Progress Notes (Signed)
Pre visit review using our clinic review tool, if applicable. No additional management support is needed unless otherwise documented below in the visit note. 

## 2016-06-14 NOTE — Patient Instructions (Addendum)
Hip Bursitis Bursitis is a swelling and soreness (inflammation) of a fluid-filled sac (bursa). This sac overlies and protects the joints.  CAUSES   Injury.  Overuse of the muscles surrounding the joint.  Arthritis.  Gout.  Infection.  Cold weather.  Inadequate warm-up and conditioning prior to activities. The cause may not be known.  SYMPTOMS   Mild to severe irritation.  Tenderness and swelling over the outside of the hip.  Pain with motion of the hip.  If the bursa becomes infected, a fever may be present. Redness, tenderness, and warmth will develop over the hip. Symptoms usually lessen in 3 to 4 weeks with treatment, but can come back. TREATMENT If conservative treatment does not work, your caregiver may advise draining the bursa and injecting cortisone into the area. This may speed up the healing process. This may also be used as an initial treatment of choice. HOME CARE INSTRUCTIONS   Apply ice to the affected area for 15-20 minutes every 3 to 4 hours while awake for the first 2 days. Put the ice in a plastic bag and place a towel between the bag of ice and your skin.  Rest the painful joint as much as possible, but continue to put the joint through a normal range of motion at least 4 times per day. When the pain lessens, begin normal, slow movements and usual activities to help prevent stiffness of the hip.  Only take over-the-counter or prescription medicines for pain, discomfort, or fever as directed by your caregiver.  Use crutches to limit weight bearing on the hip joint, if advised.  Elevate your painful hip to reduce swelling. Use pillows for propping and cushioning your legs and hips.  Gentle massage may provide comfort and decrease swelling. SEEK IMMEDIATE MEDICAL CARE IF:   Your pain increases even during treatment, or you are not improving.  You have a fever.  You have heat and inflammation over the involved bursa.  You have any other questions or  concerns. MAKE SURE YOU:   Understand these instructions.  Will watch your condition.  Will get help right away if you are not doing well or get worse.   This information is not intended to replace advice given to you by your health care provider. Make sure you discuss any questions you have with your health care provider.   Document Released: 03/15/2002 Document Revised: 12/16/2011 Document Reviewed: 04/25/2015 Elsevier Interactive Patient Education 2016 [place] left lateral hip 15-20 minutes 2-3 times daily Touch base in 2 weeks if no better.

## 2016-06-23 ENCOUNTER — Other Ambulatory Visit: Payer: Self-pay | Admitting: Family Medicine

## 2016-06-24 ENCOUNTER — Ambulatory Visit (INDEPENDENT_AMBULATORY_CARE_PROVIDER_SITE_OTHER): Payer: BLUE CROSS/BLUE SHIELD | Admitting: Family Medicine

## 2016-06-24 ENCOUNTER — Encounter: Payer: Self-pay | Admitting: Family Medicine

## 2016-06-24 VITALS — BP 156/90 | HR 77 | Temp 97.9°F | Resp 16 | Ht 63.5 in | Wt 194.8 lb

## 2016-06-24 DIAGNOSIS — H1131 Conjunctival hemorrhage, right eye: Secondary | ICD-10-CM

## 2016-06-24 NOTE — Patient Instructions (Addendum)
This appears to be a subconjunctival hemorrhage which will resolve within 1 to 2 weeks. If symptoms do not improve, worsen, or you develop new symptoms, please follow up with for further evaluation and treatment.    Subconjunctival Hemorrhage Subconjunctival hemorrhage is bleeding that happens between the white part of your eye (sclera) and the clear membrane that covers the outside of your eye (conjunctiva). There are many tiny blood vessels near the surface of your eye. A subconjunctival hemorrhage happens when one or more of these vessels breaks and bleeds, causing a red patch to appear on your eye. This is similar to a bruise. Depending on the amount of bleeding, the red patch may only cover a small area of your eye or it may cover the entire visible part of the sclera. If a lot of blood collects under the conjunctiva, there may also be swelling. Subconjunctival hemorrhages do not affect your vision or cause pain, but your eye may feel irritated if there is swelling. Subconjunctival hemorrhages usually do not require treatment, and they disappear on their own within two weeks. CAUSES This condition may be caused by:  Mild trauma, such as rubbing your eye too hard.  Severe trauma or blunt injuries.  Coughing, sneezing, or vomiting.  Straining, such as when lifting a heavy object.  High blood pressure.  Recent eye surgery.  A history of diabetes.  Certain medicines, especially blood thinners (anticoagulants).  Other conditions, such as eye tumors, bleeding disorders, or blood vessel abnormalities. Subconjunctival hemorrhages can happen without an obvious cause.  SYMPTOMS  Symptoms of this condition include:  A bright red or dark red patch on the white part of the eye.  The red area may spread out to cover a larger area of the eye before it goes away.  The red area may turn brownish-yellow before it goes away.  Swelling.  Mild eye irritation. DIAGNOSIS This condition is  diagnosed with a physical exam. If your subconjunctival hemorrhage was caused by trauma, your health care provider may refer you to an eye specialist (ophthalmologist) or another specialist to check for other injuries. You may have other tests, including:  An eye exam.  A blood pressure check.  Blood tests to check for bleeding disorders. If your subconjunctival hemorrhage was caused by trauma, X-rays or a CT scan may be done to check for other injuries. TREATMENT Usually, no treatment is needed. Your health care provider may recommend eye drops or cold compresses to help with discomfort. HOME CARE INSTRUCTIONS  Take over-the-counter and prescription medicines only as directed by your health care provider.  Use eye drops or cold compresses to help with discomfort as directed by your health care provider.  Avoid activities, things, and environments that may irritate or injure your eye.  Keep all follow-up visits as told by your health care provider. This is important. SEEK MEDICAL CARE IF:  You have pain in your eye.  The bleeding does not go away within 3 weeks.  You keep getting new subconjunctival hemorrhages. SEEK IMMEDIATE MEDICAL CARE IF:  Your vision changes or you have difficulty seeing.  You suddenly develop severe sensitivity to light.  You develop a severe headache, persistent vomiting, confusion, or abnormal tiredness (lethargy).  Your eye seems to bulge or protrude from your eye socket.  You develop unexplained bruises on your body.  You have unexplained bleeding in another area of your body.   This information is not intended to replace advice given to you by your health care  provider. Make sure you discuss any questions you have with your health care provider.   Document Released: 09/23/2005 Document Revised: 06/14/2015 Document Reviewed: 11/30/2014 Elsevier Interactive Patient Education [employer].

## 2016-06-24 NOTE — Progress Notes (Signed)
Subjective:    Patient ID: Tiffany Montes, female    DOB: 05/10/1956, 60 y.o.   MRN: [id number]  HPI  Tiffany Montes is a 60 year old female who presents today with redness in her right eye that she noticed when she woke up this morning. No itchy, watery eyes, discharge, fever, chills, sweats. No changes in vision, photophobia, foreign body, or use of contact lenses. Visual screening R:  20/20   L: 20/25   Both: 20/20 with correction. She denies noticing this last night and reports using her usual make up remover without difficulty. No treatment at home at this time. She denies any triggers such as straining with constipation or coughing. States she has been under a lot of stress lately with her work and family events.  She is not on an anticoagulant  She reports compliance with HTN regimen and states that she has been under a lot of stress and that this can be elevated when coming to seek medical treatment.    Review of Systems  Constitutional: Negative for chills, fatigue and fever.  HENT: Negative for congestion, postnasal drip, sinus pressure, sneezing and sore throat.   Eyes: Negative for visual disturbance.  Respiratory: Negative for cough, shortness of breath and wheezing.   Cardiovascular: Negative for chest pain and palpitations.  Gastrointestinal: Negative for abdominal pain, diarrhea, nausea and vomiting.  Neurological: Negative for dizziness, light-headedness and headaches.   Past Medical History:  Diagnosis Date  . Anxiety   . Attention deficit disorder   . Depression   . Gallstones 09/05/2011  . Hyperlipidemia   . Hypertension   . Migraines   . PONV (postoperative nausea and vomiting)   . Thyroid disease    hypothyroid     Social History   Social History  . Marital status: Single    Spouse name: N/A  . Number of children: N/A  . Years of education: N/A   Occupational History  . Not on file.   Social History Main Topics  . Smoking status: Never Smoker    . Smokeless tobacco: Never Used  . Alcohol use Yes     Comment: 1-2 a week and reports not every week  . Drug use: No  . Sexual activity: Not on file   Other Topics Concern  . Not on file   Social History Narrative  . No narrative on file    Past Surgical History:  Procedure Laterality Date  . ABDOMINAL HYSTERECTOMY  2003  . CHOLECYSTECTOMY  09/26/2011   Procedure: LAPAROSCOPIC CHOLECYSTECTOMY WITH INTRAOPERATIVE CHOLANGIOGRAM;  Surgeon: Haywood Lasso, MD;  Location: [facility];  Service: General;  Laterality: N/A;  . CYSTIC HYGROMA EXCISION      Family History  Problem Relation Age of Onset  . Diabetes Father   . Cancer Father     lung   . Heart disease Father     Allergies  Allergen Reactions  . Morphine Sulfate Nausea And Vomiting and Rash    REACTION: GI upset    Current Outpatient Prescriptions on File Prior to Visit  Medication Sig Dispense Refill  . amphetamine-dextroamphetamine (ADDERALL XR) 30 MG 24 hr capsule Take 1 capsule (30 mg total) by mouth daily. May refill in one month. 30 capsule 0  . benzonatate (TESSALON) 200 MG capsule TAKE 1 CAPSULE AS NEEDED FOR ALLERGIES 60 capsule 0  . buPROPion (WELLBUTRIN XL) 300 MG 24 hr tablet TAKE 1 TABLET EVERY DAY 90 tablet 2  . clonazePAM (KLONOPIN) 0.5  MG tablet TAKE 1 TABLET AT BEDTIME 30 tablet 5  . escitalopram (LEXAPRO) 10 MG tablet TAKE 1 TABLET (10 MG TOTAL) BY MOUTH DAILY. 90 tablet 1  . estradiol (ESTRACE) 1 MG tablet Take 1 mg by mouth daily. Per [name], MD, OB/GYN    . fluticasone (FLONASE) 50 MCG/ACT nasal spray USE 2 SPRAYS IN EACH NOSTRIL DAILY 15.8 g 2  . levothyroxine (SYNTHROID, LEVOTHROID) 125 MCG tablet Take 1 tablet (125 mcg total) by mouth daily. 90 tablet 3  . losartan-hydrochlorothiazide (HYZAAR) 50-12.5 MG tablet Take 1 tablet by mouth daily. 90 tablet 3  . montelukast (SINGULAIR) 10 MG tablet TAKE 1 TABLET AT BEDTIME 30 tablet 6  . Multiple Vitamins-Minerals (MULTIVITAMINS THER. W/MINERALS)  TABS Take 1 tablet by mouth daily.      . SUMAtriptan (IMITREX) 100 MG tablet TAKE 1 TABLET BY MOUTH EVERY DAY AS NEEDED FOR MIGRAINE. MAY REPEAT IN 24 HOURS. 27 tablet 0  . betamethasone dipropionate (DIPROLENE) 0.05 % cream Apply topically 2 (two) times daily. (Patient not taking: Reported on 06/24/2016) 30 g 0   No current facility-administered medications on file prior to visit.     BP (!) 156/90 (BP Location: Left Arm, Patient Position: Sitting, Cuff Size: Normal)   Pulse 77   Temp 97.9 F (36.6 C) (Oral)   Resp 16   Ht 5' 3.5" (1.613 m)   Wt 194 lb 12.8 oz (88.4 kg)   SpO2 97%   BMI 33.97 kg/m       Objective:   Physical Exam  Constitutional: She is oriented to person, place, and time. She appears well-developed and well-nourished.  Eyes: Pupils are equal, round, and reactive to light. Lids are everted and swept, no foreign bodies found. Right conjunctiva has a hemorrhage. Left conjunctiva has no hemorrhage.  Red area located in her right eye between between sclera and conjunctiva. No photophobia, visual changes, or discomfort present today.  Neck: Neck supple.  Cardiovascular: Normal rate and regular rhythm.   Pulmonary/Chest: Effort normal and breath sounds normal. She has no wheezes. She has no rales.  Lymphadenopathy:    She has no cervical adenopathy.  Neurological: She is alert and oriented to person, place, and time.  Skin: Skin is warm and dry. No rash noted.     Assessment & Plan:  1. Subconjunctival hemorrhage, right Exam and history support subconjunctival hemorrhage. No visual changes, photophobia, drainage, redness, or discomfort.  Advised patient that this is expected to improve in 1 to 2 weeks. Advise BP monitoring and follow up if average of readings is >150/90 with treatment.  If symptoms do not improve, worsen, or she develops new symptoms, advised follow up for further evaluation. Patient voiced understanding and agreed with plan. Delano Metz,  FNP-C

## 2016-06-24 NOTE — Progress Notes (Signed)
Pre visit review using our clinic review tool, if applicable. No additional management support is needed unless otherwise documented below in the visit note. 

## 2016-06-25 ENCOUNTER — Other Ambulatory Visit: Payer: Self-pay | Admitting: Family Medicine

## 2016-08-05 ENCOUNTER — Other Ambulatory Visit: Payer: Self-pay | Admitting: Family Medicine

## 2016-08-06 ENCOUNTER — Encounter: Payer: Self-pay | Admitting: Adult Health

## 2016-08-06 ENCOUNTER — Ambulatory Visit (INDEPENDENT_AMBULATORY_CARE_PROVIDER_SITE_OTHER): Payer: Self-pay | Admitting: Adult Health

## 2016-08-06 DIAGNOSIS — S39012S Strain of muscle, fascia and tendon of lower back, sequela: Secondary | ICD-10-CM
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
## 2016-08-22 ENCOUNTER — Other Ambulatory Visit: Payer: Self-pay | Admitting: Family Medicine

## 2016-08-29 ENCOUNTER — Other Ambulatory Visit: Payer: Self-pay | Admitting: Family Medicine

## 2016-09-05 ENCOUNTER — Telehealth: Payer: Self-pay | Admitting: Family Medicine

## 2016-09-05 MED ORDER — AMPHETAMINE-DEXTROAMPHET ER 30 MG PO CP24: 30.0000 mg | ORAL_CAPSULE | Freq: Every day | ORAL | 0 refills | Status: DC

## 2016-09-05 NOTE — Telephone Encounter (Signed)
Pt is aware that script is up front for pick up--will forward to Dr. Elease Hashimoto as an Juluis Rainier.

## 2016-09-05 NOTE — Telephone Encounter (Signed)
Pt need new Rx for Adderall °

## 2016-09-05 NOTE — Telephone Encounter (Signed)
Ok to refill x1 month and forward to PCP in terms of follow up. Thanks.

## 2016-09-05 NOTE — Addendum Note (Signed)
Addended by: Elio Forget on: 09/05/2016 10:04 AM   Modules accepted: Orders
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
## 2016-09-13 ENCOUNTER — Other Ambulatory Visit: Payer: Self-pay

## 2016-09-13 MED ORDER — AMPHETAMINE-DEXTROAMPHET ER 30 MG PO CP24: 30.0000 mg | ORAL_CAPSULE | Freq: Every day | ORAL | 0 refills | Status: DC

## 2016-10-18 ENCOUNTER — Other Ambulatory Visit: Payer: Self-pay | Admitting: Family Medicine

## 2016-10-21 ENCOUNTER — Other Ambulatory Visit: Payer: Self-pay | Admitting: Family Medicine

## 2016-11-01 ENCOUNTER — Other Ambulatory Visit: Payer: Self-pay | Admitting: Family Medicine

## 2016-11-07 HISTORY — PX: GALLBLADDER SURGERY: SHX652

## 2016-11-11 ENCOUNTER — Other Ambulatory Visit: Payer: Self-pay | Admitting: Family Medicine

## 2016-12-06 ENCOUNTER — Telehealth: Payer: Self-pay | Admitting: Family Medicine

## 2016-12-06 NOTE — Telephone Encounter (Signed)
Pt need new Rx for Adderall  Pt is aware of the 3 business days for refills.

## 2016-12-09 MED ORDER — AMPHETAMINE-DEXTROAMPHET ER 30 MG PO CP24: 30.0000 mg | ORAL_CAPSULE | Freq: Every day | ORAL | 0 refills | Status: DC

## 2016-12-09 NOTE — Telephone Encounter (Signed)
Last refill was 09/13/16, last CPE 12/04/15, last office visit 06/14/16.  Okay to fill?

## 2016-12-09 NOTE — Telephone Encounter (Signed)
Left message on machine that Rx is ready for pick up 

## 2016-12-09 NOTE — Telephone Encounter (Signed)
OK 

## 2016-12-13 ENCOUNTER — Other Ambulatory Visit (INDEPENDENT_AMBULATORY_CARE_PROVIDER_SITE_OTHER): Payer: BLUE CROSS/BLUE SHIELD

## 2016-12-13 DIAGNOSIS — Z0000 Encounter for general adult medical examination without abnormal findings: Secondary | ICD-10-CM | POA: Diagnosis not present

## 2016-12-13 LAB — CBC WITH DIFFERENTIAL/PLATELET
Basophils Absolute: 0 10*3/uL (ref 0.0–0.1)
Basophils Relative: 0.7 % (ref 0.0–3.0)
EOS PCT: 3.4 % (ref 0.0–5.0)
Eosinophils Absolute: 0.2 10*3/uL (ref 0.0–0.7)
HCT: 41.1 % (ref 36.0–46.0)
HEMOGLOBIN: 13.9 g/dL (ref 12.0–15.0)
LYMPHS ABS: 2.2 10*3/uL (ref 0.7–4.0)
Lymphocytes Relative: 31.5 % (ref 12.0–46.0)
MCHC: 33.8 g/dL (ref 30.0–36.0)
MCV: 86.9 fl (ref 78.0–100.0)
MONO ABS: 0.6 10*3/uL (ref 0.1–1.0)
MONOS PCT: 8.6 % (ref 3.0–12.0)
NEUTROS PCT: 55.8 % (ref 43.0–77.0)
Neutro Abs: 3.9 10*3/uL (ref 1.4–7.7)
Platelets: 235 10*3/uL (ref 150.0–400.0)
RBC: 4.73 Mil/uL (ref 3.87–5.11)
RDW: 13 % (ref 11.5–15.5)
WBC: 6.9 10*3/uL (ref 4.0–10.5)

## 2016-12-13 LAB — LIPID PANEL
Cholesterol: 243 mg/dL — ABNORMAL HIGH (ref 0–200)
HDL: 67.4 mg/dL (ref >39.00)
LDL Cholesterol: 157 mg/dL — ABNORMAL HIGH (ref 0–99)
NONHDL: 175.16
Total CHOL/HDL Ratio: 4
Triglycerides: 93 mg/dL (ref 0.0–149.0)
VLDL: 18.6 mg/dL (ref 0.0–40.0)

## 2016-12-13 LAB — HEPATIC FUNCTION PANEL
ALT: 11 U/L (ref 0–35)
AST: 13 U/L (ref 0–37)
Albumin: 4 g/dL (ref 3.5–5.2)
Alkaline Phosphatase: 61 U/L (ref 39–117)
Bilirubin, Direct: 0.1 mg/dL (ref 0.0–0.3)
Total Bilirubin: 0.3 mg/dL (ref 0.2–1.2)
Total Protein: 6.6 g/dL (ref 6.0–8.3)

## 2016-12-13 LAB — BASIC METABOLIC PANEL WITH GFR
BUN: 20 mg/dL (ref 6–23)
CO2: 29 meq/L (ref 19–32)
Calcium: 9.7 mg/dL (ref 8.4–10.5)
Chloride: 103 meq/L (ref 96–112)
Creatinine, Ser: 0.89 mg/dL (ref 0.40–1.20)
GFR: 68.34 mL/min
Glucose, Bld: 115 mg/dL — ABNORMAL HIGH (ref 70–99)
Potassium: 4.5 meq/L (ref 3.5–5.1)
Sodium: 140 meq/L (ref 135–145)

## 2016-12-13 LAB — TSH: TSH: 1.94 u[IU]/mL (ref 0.35–4.50)

## 2016-12-16 DIAGNOSIS — H35033 Hypertensive retinopathy, bilateral: Secondary | ICD-10-CM | POA: Diagnosis not present

## 2016-12-18 ENCOUNTER — Encounter: Payer: Self-pay | Admitting: Family Medicine

## 2016-12-18 ENCOUNTER — Ambulatory Visit (INDEPENDENT_AMBULATORY_CARE_PROVIDER_SITE_OTHER): Payer: BLUE CROSS/BLUE SHIELD | Admitting: Family Medicine

## 2016-12-18 VITALS — BP 110/80 | HR 79 | Temp 98.3°F | Ht 63.0 in | Wt 200.4 lb

## 2016-12-18 DIAGNOSIS — Z0000 Encounter for general adult medical examination without abnormal findings: Secondary | ICD-10-CM | POA: Diagnosis not present

## 2016-12-18 NOTE — Patient Instructions (Signed)
Consider repeat colonoscopy by next year. Try to stop the Klonopin altogether Consider calorie tracking such as [employer]itness[employer]- free app. Start back regular exercise such as walking.

## 2016-12-18 NOTE — Progress Notes (Signed)
Subjective:     Patient ID: Tiffany Montes, female   DOB: [birth year], 61 y.o.   MRN: [id number]  HPI Patient's seen for complete physical. She tends to see her gynecologist yearly. She is due for repeat Pap smear. She's been getting colonoscopies every 5 years is due next year. She's had steady weight gain over the past several years and realizes she's had poor dietary compliance. Also not exercising regularly. She plans to start walking soon. Current medications reviewed. Tetanus up-to-date. She will need booster in 2 years.  Past Medical History:  Diagnosis Date  . Anxiety   . Attention deficit disorder   . Depression   . Gallstones 09/05/2011  . Hyperlipidemia   . Hypertension   . Migraines   . PONV (postoperative nausea and vomiting)   . Thyroid disease    hypothyroid   Past Surgical History:  Procedure Laterality Date  . ABDOMINAL HYSTERECTOMY  2003  . CHOLECYSTECTOMY  09/26/2011   Procedure: LAPAROSCOPIC CHOLECYSTECTOMY WITH INTRAOPERATIVE CHOLANGIOGRAM;  Surgeon: Haywood Lasso, MD;  Location: [place];  Service: General;  Laterality: N/A;  . CYSTIC HYGROMA EXCISION      reports that she has never smoked. She has never used smokeless tobacco. She reports that she drinks alcohol. She reports that she does not use drugs. family history includes Cancer in her father; Diabetes in her father; Heart disease in her father. Allergies  Allergen Reactions  . Morphine Sulfate Nausea And Vomiting and Rash    REACTION: GI upset     Review of Systems  Constitutional: Positive for fatigue. Negative for activity change, appetite change, fever and unexpected weight change.  HENT: Negative for ear pain, hearing loss, sore throat and trouble swallowing.   Eyes: Negative for visual disturbance.  Respiratory: Negative for cough and shortness of breath.   Cardiovascular: Negative for chest pain and palpitations.  Gastrointestinal: Negative for abdominal pain, blood in stool, constipation  and diarrhea.  Genitourinary: Negative for dysuria and hematuria.  Musculoskeletal: Negative for arthralgias, back pain and myalgias.  Skin: Negative for rash.  Neurological: Negative for dizziness, syncope and headaches.  Hematological: Negative for adenopathy.  Psychiatric/Behavioral: Negative for confusion and dysphoric mood.       Objective:   Physical Exam  Constitutional: She is oriented to person, place, and time. She appears well-developed and well-nourished.  HENT:  Head: Normocephalic and atraumatic.  Eyes: EOM are normal. Pupils are equal, round, and reactive to light.  Neck: Normal range of motion. Neck supple. No thyromegaly present.  Cardiovascular: Normal rate, regular rhythm and normal heart sounds.   No murmur heard. Pulmonary/Chest: Breath sounds normal. No respiratory distress. She has no wheezes. She has no rales.  Abdominal: Soft. Bowel sounds are normal. She exhibits no distension and no mass. There is no tenderness. There is no rebound and no guarding.  Genitourinary:  Genitourinary Comments: per GYN  Musculoskeletal: Normal range of motion. She exhibits no edema.  Lymphadenopathy:    She has no cervical adenopathy.  Neurological: She is alert and oriented to person, place, and time. She displays normal reflexes. No cranial nerve deficit.  Skin: No rash noted.  Psychiatric: She has a normal mood and affect. Her behavior is normal. Judgment and thought content normal.       Assessment:     Physical exam. Labs reviewed. She has prediabetes range glucose. She has high cholesterol but also high HDL. Steady weight gain over several years as above    Plan:     -  We strongly challenged her to lose some weight this year and spent quite some time discussing strategies -Consider free app for her phone such as My Fitness Pal-to track calories -We strongly advise her to discontinue Klonopin altogether which she takes infrequently for insomnia -She'll get repeat  colonoscopy by next year -Continue GYN follow-up -Reduce sugars and starches  Eulas Post MD [facility]

## 2016-12-18 NOTE — Progress Notes (Signed)
Pre visit review using our clinic review tool, if applicable. No additional management support is needed unless otherwise documented below in the visit note. 

## 2017-01-09 ENCOUNTER — Other Ambulatory Visit: Payer: Self-pay | Admitting: Family Medicine

## 2017-02-03 ENCOUNTER — Ambulatory Visit (INDEPENDENT_AMBULATORY_CARE_PROVIDER_SITE_OTHER): Payer: BLUE CROSS/BLUE SHIELD | Admitting: Family Medicine

## 2017-02-03 VITALS — BP 120/80 | HR 80 | Temp 98.2°F | Wt 199.7 lb

## 2017-02-03 DIAGNOSIS — H8112 Benign paroxysmal vertigo, left ear: Secondary | ICD-10-CM

## 2017-02-03 NOTE — Progress Notes (Signed)
Pre visit review using our clinic review tool, if applicable. No additional management support is needed unless otherwise documented below in the visit note. 

## 2017-02-03 NOTE — Progress Notes (Signed)
Subjective:     Patient ID: Tiffany Montes, female   DOB: [birth year], 61 y.o.   MRN: [id number]  HPI Patient seen with chief complaint of dizziness. This is a new complaint.  On further description, she has vertigo which is consistently worse on turning to the left side. She's had symptoms for 2 weeks now. Symptoms of moderate severity.  Frequently notices when rolling over in bed to the left side. Sometimes has nausea but no vomiting. No hearing changes. No tinnitus. Denies any ataxia. No focal weakness. No headaches. Symptoms are usually very transient lasting only a few seconds. She is still working and driving. Denies prior history of vertigo.  Symptoms alleviated by holding still.    Past Medical History:  Diagnosis Date  . Anxiety   . Attention deficit disorder   . Depression   . Gallstones 09/05/2011  . Hyperlipidemia   . Hypertension   . Migraines   . PONV (postoperative nausea and vomiting)   . Thyroid disease    hypothyroid   Past Surgical History:  Procedure Laterality Date  . ABDOMINAL HYSTERECTOMY  2003  . CHOLECYSTECTOMY  09/26/2011   Procedure: LAPAROSCOPIC CHOLECYSTECTOMY WITH INTRAOPERATIVE CHOLANGIOGRAM;  Surgeon: Haywood Lasso, MD;  Location: [place];  Service: General;  Laterality: N/A;  . CYSTIC HYGROMA EXCISION      reports that she has never smoked. She has never used smokeless tobacco. She reports that she drinks alcohol. She reports that she does not use drugs. family history includes Cancer in her father; Diabetes in her father; Heart disease in her father. Allergies  Allergen Reactions  . Morphine Sulfate Nausea And Vomiting and Rash    REACTION: GI upset     Review of Systems  Constitutional: Negative for fatigue.  HENT: Negative for congestion, ear discharge, ear pain and hearing loss.   Eyes: Negative for visual disturbance.  Respiratory: Negative for cough, chest tightness, shortness of breath and wheezing.   Cardiovascular: Negative for  chest pain, palpitations and leg swelling.  Gastrointestinal: Negative for vomiting.  Neurological: Positive for dizziness. Negative for seizures, syncope, weakness, light-headedness and headaches.  Psychiatric/Behavioral: Negative for confusion.       Objective:   Physical Exam  Constitutional: She is oriented to person, place, and time. She appears well-developed and well-nourished.  Eyes: Pupils are equal, round, and reactive to light.  Neck: Neck supple. No JVD present. No thyromegaly present.  Cardiovascular: Normal rate and regular rhythm.  Exam reveals no gallop.   Pulmonary/Chest: Effort normal and breath sounds normal. No respiratory distress. She has no wheezes. She has no rales.  Musculoskeletal: She exhibits no edema.  Neurological: She is alert and oriented to person, place, and time. No cranial nerve deficit. Coordination normal.  No focal strength deficits. Finger to nose testing is normal. Patient has symptoms that are reproducible with lying supine with head turned to left but not the right No nystagmus noted.       Assessment:     Probable benign peripheral positional vertigo to the left.  Symptoms reproducible on exam.    Plan:     -Recommend she try Epley maneuvers to left.  Handout given and went over   If she is not getting resolution over the next few days with this would consider vestibular rehabilitation referral. -Follow-up immediately for any new symptoms -we discussed the very limited role of medications in treating BPPV.    Eulas Post MD  Primary [facility] at [facility]

## 2017-02-03 NOTE — Patient Instructions (Signed)
Benign Positional Vertigo Vertigo is the feeling that you or your surroundings are moving when they are not. Benign positional vertigo is the most common form of vertigo. The cause of this condition is not serious (is benign). This condition is triggered by certain movements and positions (is positional). This condition can be dangerous if it occurs while you are doing something that could endanger you or others, such as driving. What are the causes? In many cases, the cause of this condition is not known. It may be caused by a disturbance in an area of the inner ear that helps your brain to sense movement and balance. This disturbance can be caused by a viral infection (labyrinthitis), head injury, or repetitive motion. What increases the risk? This condition is more likely to develop in:  Women.  People who are 50 years of age or older. What are the signs or symptoms? Symptoms of this condition usually happen when you move your head or your eyes in different directions. Symptoms may start suddenly, and they usually last for less than a minute. Symptoms may include:  Loss of balance and falling.  Feeling like you are spinning or moving.  Feeling like your surroundings are spinning or moving.  Nausea and vomiting.  Blurred vision.  Dizziness.  Involuntary eye movement (nystagmus). Symptoms can be mild and cause only slight annoyance, or they can be severe and interfere with daily life. Episodes of benign positional vertigo may return (recur) over time, and they may be triggered by certain movements. Symptoms may improve over time. How is this diagnosed? This condition is usually diagnosed by medical history and a physical exam of the head, neck, and ears. You may be referred to a health care provider who specializes in ear, nose, and throat (ENT) problems (otolaryngologist) or a provider who specializes in disorders of the nervous system (neurologist). You may have additional testing,  including:  MRI.  A CT scan.  Eye movement tests. Your health care provider may ask you to change positions quickly while he or she watches you for symptoms of benign positional vertigo, such as nystagmus. Eye movement may be tested with an electronystagmogram (ENG), caloric stimulation, the Dix-Hallpike test, or the roll test.  An electroencephalogram (EEG). This records electrical activity in your brain.  Hearing tests. How is this treated? Usually, your health care provider will treat this by moving your head in specific positions to adjust your inner ear back to normal. Surgery may be needed in severe cases, but this is rare. In some cases, benign positional vertigo may resolve on its own in 2-4 weeks. Follow these instructions at home: Safety   Move slowly.Avoid sudden body or head movements.  Avoid driving.  Avoid operating heavy machinery.  Avoid doing any tasks that would be dangerous to you or others if a vertigo episode would occur.  If you have trouble walking or keeping your balance, try using a cane for stability. If you feel dizzy or unstable, sit down right away.  Return to your normal activities as told by your health care provider. Ask your health care provider what activities are safe for you. General instructions   Take over-the-counter and prescription medicines only as told by your health care provider.  Avoid certain positions or movements as told by your health care provider.  Drink enough fluid to keep your urine clear or pale yellow.  Keep all follow-up visits as told by your health care provider. This is important. Contact a health care provider   if:  You have a fever.  Your condition gets worse or you develop new symptoms.  Your family or friends notice any behavioral changes.  Your nausea or vomiting gets worse.  You have numbness or a "pins and needles" sensation. Get help right away if:  You have difficulty speaking or moving.  You are  always dizzy.  You faint.  You develop severe headaches.  You have weakness in your legs or arms.  You have changes in your hearing or vision.  You develop a stiff neck.  You develop sensitivity to light. This information is not intended to replace advice given to you by your health care provider. Make sure you discuss any questions you have with your health care provider. Document Released: 07/01/2006 Document Revised: 02/29/2016 Document Reviewed: 01/16/2015 Elsevier Interactive Patient Education  2017 [place]. How to Perform the Epley Maneuver The Epley maneuver is an exercise that relieves symptoms of vertigo. Vertigo is the feeling that you or your surroundings are moving when they are not. When you feel vertigo, you may feel like the room is spinning and have trouble walking. Dizziness is a little different than vertigo. When you are dizzy, you may feel unsteady or light-headed. You can do this maneuver at home whenever you have symptoms of vertigo. You can do it up to 3 times a day until your symptoms go away. Even though the Epley maneuver may relieve your vertigo for a few weeks, it is possible that your symptoms will return. This maneuver relieves vertigo, but it does not relieve dizziness. What are the risks? If it is done correctly, the Epley maneuver is considered safe. Sometimes it can lead to dizziness or nausea that goes away after a short time. If you develop other symptoms, such as changes in vision, weakness, or numbness, stop doing the maneuver and call your health care provider. How to perform the Epley maneuver 1. Sit on the edge of a bed or table with your back straight and your legs extended or hanging over the edge of the bed or table. 2. Turn your head halfway toward the affected ear or side. 3. Lie backward quickly with your head turned until you are lying flat on your back. You may want to position a pillow under your shoulders. 4. Hold this position for 30  seconds. You may experience an attack of vertigo. This is normal. 5. Turn your head to the opposite direction until your unaffected ear is facing the floor. 6. Hold this position for 30 seconds. You may experience an attack of vertigo. This is normal. Hold this position until the vertigo stops. 7. Turn your whole body to the same side as your head. Hold for another 30 seconds. 8. Sit back up. You can repeat this exercise up to 3 times a day. Follow these instructions at home:  After doing the Epley maneuver, you can return to your normal activities.  Ask your health care provider if there is anything you should do at home to prevent vertigo. He or she may recommend that you:  Keep your head raised (elevated) with two or more pillows while you sleep.  Do not sleep on the side of your affected ear.  Get up slowly from bed.  Avoid sudden movements during the day.  Avoid extreme head movement, like looking up or bending over. Contact a health care provider if:  Your vertigo gets worse.  You have other symptoms, including:  Nausea.  Vomiting.  Headache. Get help right  away if:  You have vision changes.  You have a severe or worsening headache or neck pain.  You cannot stop vomiting.  You have new numbness or weakness in any part of your body. Summary  Vertigo is the feeling that you or your surroundings are moving when they are not.  The Epley maneuver is an exercise that relieves symptoms of vertigo.  If the Epley maneuver is done correctly, it is considered safe. You can do it up to 3 times a day. This information is not intended to replace advice given to you by your health care provider. Make sure you discuss any questions you have with your health care provider. Document Released: 09/28/2013 Document Revised: 08/13/2016 Document Reviewed: 08/13/2016 Elsevier Interactive Patient Education  2017 [employer].

## 2017-03-03 ENCOUNTER — Other Ambulatory Visit: Payer: Self-pay | Admitting: Family Medicine

## 2017-03-11 ENCOUNTER — Telehealth: Payer: Self-pay | Admitting: Family Medicine

## 2017-03-11 NOTE — Telephone Encounter (Signed)
Pt need new Rx for Adderall  Pt is aware of 3 business days for refills and someone will call when ready for pick up.  Pt is aware that Dr. Elease Hashimoto is out of the office and will be returning on 03/17/17

## 2017-03-11 NOTE — Telephone Encounter (Signed)
Yes thanks 

## 2017-03-11 NOTE — Telephone Encounter (Signed)
Last refill 12/09/16 and last office visit 02/03/17.  Okay to fill for 30 days?

## 2017-03-12 MED ORDER — AMPHETAMINE-DEXTROAMPHET ER 30 MG PO CP24: 30.0000 mg | ORAL_CAPSULE | Freq: Every day | ORAL | 0 refills | Status: DC

## 2017-03-12 NOTE — Telephone Encounter (Signed)
2 prescriptions were printed.  The 1 that states "can fill in 1 month" was printed in error and discarded.   Rx ready for pick up and patient is aware

## 2017-04-08 ENCOUNTER — Other Ambulatory Visit: Payer: Self-pay | Admitting: Family Medicine

## 2017-04-10 ENCOUNTER — Telehealth: Payer: Self-pay | Admitting: Family Medicine

## 2017-04-10 NOTE — Telephone Encounter (Signed)
Pt request refill  amphetamine-dextroamphetamine (ADDERALL XR) 30 MG 24 hr capsule  Pt only got one Rx last month because Dr Elease Hashimoto was out  Pt has only 2 pills left

## 2017-04-10 NOTE — Telephone Encounter (Signed)
Last 30 day refill 03/12/17 and last office visit 02/03/17.  Okay to fill?

## 2017-04-11 MED ORDER — AMPHETAMINE-DEXTROAMPHET ER 30 MG PO CP24: 30.0000 mg | ORAL_CAPSULE | Freq: Every day | ORAL | 0 refills | Status: DC

## 2017-04-11 NOTE — Telephone Encounter (Signed)
Refill for 3 months. 

## 2017-04-11 NOTE — Telephone Encounter (Signed)
rx ready for pick up and patient is aware  

## 2017-04-18 ENCOUNTER — Other Ambulatory Visit: Payer: Self-pay | Admitting: Family Medicine

## 2017-04-26 ENCOUNTER — Other Ambulatory Visit: Payer: Self-pay | Admitting: Family Medicine

## 2017-05-14 ENCOUNTER — Other Ambulatory Visit: Payer: Self-pay | Admitting: Family Medicine

## 2017-05-25 ENCOUNTER — Other Ambulatory Visit: Payer: Self-pay | Admitting: Family Medicine

## 2017-05-26 NOTE — Telephone Encounter (Signed)
Refill once and set up follow up

## 2017-05-26 NOTE — Telephone Encounter (Signed)
Last refill 01/12/17 and last office visit 02/03/17.  Okay to fill?

## 2017-05-26 NOTE — Telephone Encounter (Signed)
Left message on machine for patient to schedule an appointment before refill is called in.

## 2017-05-27 ENCOUNTER — Other Ambulatory Visit: Payer: Self-pay | Admitting: Obstetrics & Gynecology

## 2017-05-27 DIAGNOSIS — Z1231 Encounter for screening mammogram for malignant neoplasm of breast: Secondary | ICD-10-CM

## 2017-05-29 ENCOUNTER — Telehealth: Payer: Self-pay | Admitting: Family Medicine

## 2017-05-29 NOTE — Telephone Encounter (Signed)
° ° ° °  Pt call to ask if the below med can be called in .she has an appt next week    clonazePAM (KLONOPIN) 0.5 MG tablet

## 2017-05-30 NOTE — Telephone Encounter (Signed)
Rx called in 

## 2017-06-03 ENCOUNTER — Ambulatory Visit (INDEPENDENT_AMBULATORY_CARE_PROVIDER_SITE_OTHER): Payer: BLUE CROSS/BLUE SHIELD | Admitting: Family Medicine

## 2017-06-03 ENCOUNTER — Encounter: Payer: Self-pay | Admitting: Family Medicine

## 2017-06-03 VITALS — BP 120/80 | HR 74 | Temp 98.9°F | Wt 202.9 lb

## 2017-06-03 DIAGNOSIS — Z23 Encounter for immunization: Secondary | ICD-10-CM

## 2017-06-03 DIAGNOSIS — M7062 Trochanteric bursitis, left hip: Secondary | ICD-10-CM | POA: Diagnosis not present

## 2017-06-03 DIAGNOSIS — I8393 Asymptomatic varicose veins of bilateral lower extremities: Secondary | ICD-10-CM | POA: Insufficient documentation

## 2017-06-03 DIAGNOSIS — R635 Abnormal weight gain: Secondary | ICD-10-CM | POA: Diagnosis not present

## 2017-06-03 DIAGNOSIS — I10 Essential (primary) hypertension: Secondary | ICD-10-CM | POA: Diagnosis not present

## 2017-06-03 MED ORDER — METHYLPREDNISOLONE ACETATE 80 MG/ML IJ SUSP
80.0000 mg | Freq: Once | INTRAMUSCULAR | Status: AC
  Administered 2017-06-03: 80 mg via INTRAMUSCULAR

## 2017-06-03 NOTE — Patient Instructions (Signed)
Bursitis Bursitis is inflammation and irritation of a bursa, which is one of the small, fluid-filled sacs that cushion and protect the moving parts of your body. These sacs are located between bones and muscles, muscle attachments, or skin areas next to bones. A bursa protects these structures from the wear and tear that results from frequent movement. An inflamed bursa causes pain and swelling. Fluid may build up inside the sac. Bursitis is most common near joints, especially the knees, elbows, hips, and shoulders. What are the causes? Bursitis can be caused by:  Injury from: ? A direct blow, like falling on your knee or elbow. ? Overuse of a joint (repetitive stress).  Infection. This can happen if bacteria gets into a bursa through a cut or scrape near a joint.  Diseases that cause joint inflammation, such as gout and rheumatoid arthritis.  What increases the risk? You may be at risk for bursitis if you:  Have a job or hobby that involves a lot of repetitive stress on your joints.  Have a condition that weakens your body's defense system (immune system), such as diabetes, cancer, or HIV.  Lift and reach overhead often.  Kneel or lean on hard surfaces often.  Run or walk often.  What are the signs or symptoms? The most common signs and symptoms of bursitis are:  Pain that gets worse when you move the affected body part or put weight on it.  Inflammation.  Stiffness.  Other signs and symptoms may include:  Redness.  Tenderness.  Warmth.  Pain that continues after rest.  Fever and chills. This may occur in bursitis caused by infection.  How is this diagnosed? Bursitis may be diagnosed by:  Medical history and physical exam.  MRI.  A procedure to drain fluid from the bursa with a needle (aspiration). The fluid may be checked for signs of infection or gout.  Blood tests to rule out other causes of inflammation.  How is this treated? Bursitis can usually be  treated at home with rest, ice, compression, and elevation (RICE). For mild bursitis, RICE treatment may be all you need. Other treatments may include:  Nonsteroidal anti-inflammatory drugs (NSAIDs) to treat pain and inflammation.  Corticosteroids to fight inflammation. You may have these drugs injected into and around the area of bursitis.  Aspiration of bursitis fluid to relieve pain and improve movement.  Antibiotic medicine to treat an infected bursa.  A splint, brace, or walking aid.  Physical therapy if you continue to have pain or limited movement.  Surgery to remove a damaged or infected bursa. This may be needed if you have a very bad case of bursitis or if other treatments have not worked.  Follow these instructions at home:  Take medicines only as directed by your health care provider.  If you were prescribed an antibiotic medicine, finish it all even if you start to feel better.  Rest the affected area as directed by your health care provider. ? Keep the area elevated. ? Avoid activities that make pain worse.  Apply ice to the injured area: ? Place ice in a plastic bag. ? Place a towel between your skin and the bag. ? Leave the ice on for 20 minutes, 2-3 times a day.  Use splints, braces, pads, or walking aids as directed by your health care provider.  Keep all follow-up visits as directed by your health care provider. This is important. How is this prevented?  Wear knee pads if you kneel often.  Wear sturdy running or walking shoes that fit you well.  Take regular breaks from repetitive activity.  Warm up by stretching before doing any strenuous activity.  Maintain a healthy weight or lose weight as recommended by your health care provider. Ask your health care provider if you need help.  Exercise regularly. Start any new physical activity gradually. Contact a health care provider if:  Your bursitis is not responding to treatment or home care.  You have  a fever.  You have chills. This information is not intended to replace advice given to you by your health care provider. Make sure you discuss any questions you have with your health care provider. Document Released: 09/20/2000 Document Revised: 02/29/2016 Document Reviewed: 12/13/2013 Elsevier Interactive Patient Education  2018 [place] on insurance coverage for nutrition referral.

## 2017-06-03 NOTE — Progress Notes (Signed)
Subjective:     Patient ID: Tiffany Montes, female   DOB: 14-Jan-1956, 61 y.o.   MRN: [id number]  HPI Patient seen for several issues  History of weight gain. We discussed previously seen nutritionist but she is not sure about her coverage. She will like to check on that.  Left lateral hip pain. Duration of at least 2 months. She had very similar pain in the right hip over year ago injected with steroid and that resolved her pain. No injury. No anterior or medial hip pain. She stands on her feet a lot with work. His tried icing without relief.  Pain is worse after prolonged periods of standing and after prolonged periods of sitting when she first gets up  Verrucous veins left lower extremity greater than right. Mostly nonpainful. Has some mild edema in her legs late in the day. Not using any compression hose.  Hypertension which has been well controlled with medication. No headaches or dizziness.  Past Medical History:  Diagnosis Date  . Anxiety   . Attention deficit disorder   . Depression   . Gallstones 09/05/2011  . Hyperlipidemia   . Hypertension   . Migraines   . PONV (postoperative nausea and vomiting)   . Thyroid disease    hypothyroid   Past Surgical History:  Procedure Laterality Date  . ABDOMINAL HYSTERECTOMY  2003  . CHOLECYSTECTOMY  09/26/2011   Procedure: LAPAROSCOPIC CHOLECYSTECTOMY WITH INTRAOPERATIVE CHOLANGIOGRAM;  Surgeon: Haywood Lasso, MD;  Location: [facility];  Service: General;  Laterality: N/A;  . CYSTIC HYGROMA EXCISION      reports that she has never smoked. She has never used smokeless tobacco. She reports that she drinks alcohol. She reports that she does not use drugs. family history includes Cancer in her father; Diabetes in her father; Heart disease in her father. Allergies  Allergen Reactions  . Morphine Sulfate Nausea And Vomiting and Rash    REACTION: GI upset     Review of Systems  Constitutional: Negative for fatigue.  Eyes: Negative  for visual disturbance.  Respiratory: Negative for cough, chest tightness, shortness of breath and wheezing.   Cardiovascular: Negative for chest pain, palpitations and leg swelling.  Neurological: Negative for dizziness, seizures, syncope, weakness, light-headedness and headaches.       Objective:   Physical Exam  Constitutional: She appears well-developed and well-nourished.  Eyes: Pupils are equal, round, and reactive to light.  Neck: Neck supple. No JVD present. No thyromegaly present.  Cardiovascular: Normal rate and regular rhythm.  Exam reveals no gallop.   Pulmonary/Chest: Effort normal and breath sounds normal. No respiratory distress. She has no wheezes. She has no rales.  Musculoskeletal: She exhibits no edema.  Patient has full range of motion left hip. She has tenderness over the greater trochanteric bursa.  Neurological: She is alert.       Assessment:     -#1 hypertension stable and at goal  #2 weight gain. Recent TSH at goal.  She has comorbidities of hypertension and prediabetes by recent lab work  #3 left greater trochanteric bursitis  #4 hypothyroidism at goal    Plan:     -We recommended she consider nutrition referral and she will check on insurance coverage first -Written prescription for venous compression garments 20-30 mm knee-high -Strongly encouraged weight loss -Discussed risk and benefits of left bursa injection including risks of bleeding, infection, and bruising. Patient consented. Prepped skin with Betadine. Using 25-gauge one and one half inch needle injected 1 mL  of depomedrol and 2 mL of plain Xylocaine into the bursa region without difficulty. Patient tolerated well.  Try some icing for the next couple days  Tiffany Post MD [facility]

## 2017-06-10 ENCOUNTER — Ambulatory Visit
Admission: RE | Admit: 2017-06-10 | Discharge: 2017-06-10 | Disposition: A | Discharge status: Home / Self Care | Payer: BLUE CROSS/BLUE SHIELD | Source: Ambulatory Visit | Attending: Obstetrics & Gynecology | Admitting: Obstetrics & Gynecology

## 2017-06-10 DIAGNOSIS — Z1231 Encounter for screening mammogram for malignant neoplasm of breast: Secondary | ICD-10-CM

## 2017-06-10 DIAGNOSIS — Z01419 Encounter for gynecological examination (general) (routine) without abnormal findings: Secondary | ICD-10-CM | POA: Diagnosis not present

## 2017-07-07 ENCOUNTER — Other Ambulatory Visit: Payer: Self-pay | Admitting: Family Medicine

## 2017-07-07 NOTE — Telephone Encounter (Signed)
Refill both with 2 additional refills.

## 2017-07-09 ENCOUNTER — Telehealth: Payer: Self-pay | Admitting: *Deleted

## 2017-07-09 MED ORDER — AMPHETAMINE-DEXTROAMPHET ER 30 MG PO CP24: 30.0000 mg | ORAL_CAPSULE | Freq: Every day | ORAL | 0 refills | Status: DC

## 2017-07-09 NOTE — Telephone Encounter (Signed)
Last refill 04/11/17 and last office visit 06/03/17.  Okay to fill?

## 2017-07-09 NOTE — Telephone Encounter (Signed)
Patient called requesting adderall to be refilled, patient states she is leaving to go out of town Friday and she won't run out until Sunday but she would like for this to be refilled before Friday. Please advise

## 2017-07-09 NOTE — Telephone Encounter (Signed)
Left message on machine Rx ready for pick up 

## 2017-07-09 NOTE — Telephone Encounter (Signed)
OK 

## 2017-09-08 ENCOUNTER — Ambulatory Visit: Payer: BLUE CROSS/BLUE SHIELD | Admitting: Family Medicine

## 2017-09-08 ENCOUNTER — Encounter: Payer: Self-pay | Admitting: Family Medicine

## 2017-09-08 VITALS — BP 120/80 | HR 82 | Temp 97.6°F | Wt 200.2 lb

## 2017-09-08 DIAGNOSIS — S39012A Strain of muscle, fascia and tendon of lower back, initial encounter: Secondary | ICD-10-CM

## 2017-09-08 MED ORDER — METHOCARBAMOL 500 MG PO TABS: 500.0000 mg | ORAL_TABLET | Freq: Three times a day (TID) | ORAL | 0 refills | Status: DC | PRN

## 2017-09-08 NOTE — Progress Notes (Signed)
Subjective:     Patient ID: Tiffany Montes, female   DOB: 1955/11/23, 61 y.o.   MRN: [id number]  HPI Acute visit for right low back strain. She noted onset this past Friday. She was reaching up onto a shelf to get off some Christmas decorations when she felt some pain in her right lower lumbar area. She's had pain worse with movement. She tried some ice which helped. Heat did not help. She had some leftover hydrocodone which helped. She feels her muscles are stiffening up frequently. She missed work today because of her back pain. Denies any fevers or chills. No recent appetite or weight changes. No urine or stool incontinence. Denies any lower extremity numbness or weakness.  Past Medical History:  Diagnosis Date  . Anxiety   . Attention deficit disorder   . Depression   . Gallstones 09/05/2011  . Hyperlipidemia   . Hypertension   . Migraines   . PONV (postoperative nausea and vomiting)   . Thyroid disease    hypothyroid   Past Surgical History:  Procedure Laterality Date  . ABDOMINAL HYSTERECTOMY  2003  . CHOLECYSTECTOMY  09/26/2011   Procedure: LAPAROSCOPIC CHOLECYSTECTOMY WITH INTRAOPERATIVE CHOLANGIOGRAM;  Surgeon: Haywood Lasso, MD;  Location: [place];  Service: General;  Laterality: N/A;  . CYSTIC HYGROMA EXCISION      reports that  has never smoked. she has never used smokeless tobacco. She reports that she drinks alcohol. She reports that she does not use drugs. family history includes Cancer in her father; Diabetes in her father; Heart disease in her father. Allergies  Allergen Reactions  . Morphine Sulfate Nausea And Vomiting and Rash    REACTION: GI upset     Review of Systems  Constitutional: Negative for appetite change and unexpected weight change.  Respiratory: Negative for shortness of breath.   Cardiovascular: Negative for chest pain.  Gastrointestinal: Negative for abdominal pain.  Genitourinary: Negative for dysuria and hematuria.  Musculoskeletal:  Positive for back pain.  Neurological: Negative for weakness and numbness.       Objective:   Physical Exam  Constitutional: She appears well-developed and well-nourished.  Cardiovascular: Normal rate and regular rhythm.  Pulmonary/Chest: Effort normal and breath sounds normal. No respiratory distress. She has no wheezes. She has no rales.  Musculoskeletal: She exhibits no edema.  Straight leg raise are negative bilaterally  Neurological:  Full-strength lower extremities with symmetric reflexes  Skin: No rash noted.       Assessment:     Right lower lumbar back strain. Nonfocal neuro exam    Plan:     -We recommended continued conservative management with ice and/or heat and some simple stretches. -Aerobic activity such as walking as tolerated -Robaxin 500 mg every 8 hours as needed for muscle spasm -Work note written for today and tomorrow. Touch base and 1-2 weeks if not improved. Consider physical therapy if not improving  Eulas Post MD [facility] at [facility]

## 2017-09-08 NOTE — Patient Instructions (Signed)
Low Back Sprain  A sprain is a stretch or tear in the bands of tissue that hold bones and joints together (ligaments). Sprains of the lower back (lumbar spine) are a common cause of low back pain. A sprain occurs when ligaments are overextended or stretched beyond their limits. The ligaments can become inflamed, resulting in pain and sudden muscle tightening (spasms). A sprain can be caused by an injury (trauma), or it can develop gradually due to overuse.  There are three types of sprains:  · Grade 1 is a mild sprain involving an overstretched ligament or a very slight tear of the ligament.  · Grade 2 is a moderate sprain involving a partial tear of the ligament.  · Grade 3 is a severe sprain involving a complete tear of the ligament.    What are the causes?  This condition may be caused by:  · Trauma, such as a fall or a hit to the body.  · Twisting or overstretching the back. This may result from doing activities that require a lot of energy, such as lifting heavy objects.    What increases the risk?  The following factors may increase your risk of getting this condition:  · Playing contact sports.  · Participating in sports or activities that put excessive stress on the back and require a lot of bending and twisting, including:  ? Lifting weights or heavy objects.  ? Gymnastics.  ? Soccer.  ? Figure skating.  ? Snowboarding.  · Being overweight or obese.  · Having poor strength and flexibility.    What are the signs or symptoms?  Symptoms of this condition may include:  · Sharp or dull pain in the lower back that does not go away. Pain may extend to the buttocks.  · Stiffness.  · Limited range of motion.  · Inability to stand up straight due to stiffness or pain.  · Muscle spasms.    How is this diagnosed?    This condition may be diagnosed based on:  · Your symptoms.  · Your medical history.  · A physical exam.  ? Your health care provider may push on certain areas of your back to determine the source of your  pain.  ? You may be asked to bend forward, backward, and side to side to assess the severity of your pain and your range of motion.  · Imaging tests, such as:  ? X-rays.  ? MRI.    How is this treated?  Treatment for this condition may include:  · Applying heat and cold to the affected area.  · Medicines to help relieve pain and to relax your muscles (muscle relaxants).  · NSAIDs to help reduce swelling and discomfort.  · Physical therapy.    When your symptoms improve, it is important to gradually return to your normal routine as soon as possible to reduce pain, avoid stiffness, and avoid loss of muscle strength. Generally, symptoms should improve within 6 weeks of treatment. However, recovery time varies.  Follow these instructions at home:  Managing pain, stiffness, and swelling  · If directed, apply ice to the injured area during the first 24 hours after your injury.  ? Put ice in a plastic bag.  ? Place a towel between your skin and the bag.  ? Leave the ice on for 20 minutes, 2-3 times a day.  · If directed, apply heat to the affected area as often as told by your health care provider. Use the   heat source that your health care provider recommends, such as a moist heat pack or a heating pad.  ? Place a towel between your skin and the heat source.  ? Leave the heat on for 20-30 minutes.  ? Remove the heat if your skin turns bright red. This is especially important if you are unable to feel pain, heat, or cold. You may have a greater risk of getting burned.  Activity  · Rest and return to your normal activities as told by your health care provider. Ask your health care provider what activities are safe for you.  · Avoid activities that take a lot of effort (are strenuous) for as long as told by your health care provider.  · Do exercises as told by your health care provider.  General instructions    · Take over-the-counter and prescription medicines only as told by your health care provider.  · If you have  questions or concerns about safety while taking pain medicine, talk with your health care provider.  · Do not drive or operate heavy machinery until you know how your pain medicine affects you.  · Do not use any tobacco products, such as cigarettes, chewing tobacco, and e-cigarettes. Tobacco can delay bone healing. If you need help quitting, ask your health care provider.  · Keep all follow-up visits as told by your health care provider. This is important.  How is this prevented?  · Warm up and stretch before being active.  · Cool down and stretch after being active.  · Give your body time to rest between periods of activity.  · Avoid:  ? Being physically inactive for long periods at a time.  ? Exercising or playing sports when you are tired or in pain.  · Use correct form when playing sports and lifting heavy objects.  · Use good posture when sitting and standing.  · Maintain a healthy weight.  · Sleep on a mattress with medium firmness to support your back.  · Make sure to use equipment that fits you, including shoes that fit well.  · Be safe and responsible while being active to avoid falls.  · Do at least 150 minutes of moderate-intensity exercise each week, such as brisk walking or water aerobics. Try a form of exercise that takes stress off your back, such as swimming or stationary cycling.  · Maintain physical fitness, including:  ? Strength. In particular, develop and maintain strong abdominal muscles.  ? Flexibility.  ? Cardiovascular fitness.  ? Endurance.  Contact a health care provider if:  · Your back pain does not improve after 6 weeks of treatment.  · Your symptoms get worse.  Get help right away if:  · Your back pain is severe.  · You are unable to stand or walk.  · You develop pain in your legs.  · You develop weakness in your buttocks or legs.  · You have difficulty controlling when you urinate or when you have a bowel movement.  This information is not intended to replace advice given to you by  your health care provider. Make sure you discuss any questions you have with your health care provider.  Document Released: 09/23/2005 Document Revised: 05/30/2016 Document Reviewed: 07/05/2015  Elsevier Interactive Patient Education © 2018 [employer]

## 2017-10-07 DIAGNOSIS — C50919 Malignant neoplasm of unspecified site of unspecified female breast: Secondary | ICD-10-CM

## 2017-10-07 DIAGNOSIS — Z923 Personal history of irradiation: Secondary | ICD-10-CM

## 2017-10-07 DIAGNOSIS — Z9221 Personal history of antineoplastic chemotherapy: Secondary | ICD-10-CM

## 2017-10-07 HISTORY — DX: Personal history of irradiation: Z92.3

## 2017-10-07 HISTORY — DX: Personal history of antineoplastic chemotherapy: Z92.21

## 2017-10-07 HISTORY — DX: Malignant neoplasm of unspecified site of unspecified female breast: C50.919

## 2017-10-09 ENCOUNTER — Telehealth: Payer: Self-pay | Admitting: Family Medicine

## 2017-10-09 NOTE — Telephone Encounter (Signed)
Last refill 07/09/17.  Last office visit 09/08/17 for back pain.  Last physical 12/18/16.  Okay to fill?

## 2017-10-09 NOTE — Telephone Encounter (Signed)
CRM for notification. See Telephone encounter for:   10/09/17.  Relation to XB:OERQ Call back number: [phone] [facility]acy[id number] [place], [place]. AT [address]  Reason for call:  Patient requesting amphetamine-dextroamphetamine (ADDERALL XR) 30 MG 24 hr capsule refill, informed patient please allow 48 to 72 hour turn around, please advise

## 2017-10-10 ENCOUNTER — Other Ambulatory Visit: Payer: Self-pay

## 2017-10-10 MED ORDER — AMPHETAMINE-DEXTROAMPHET ER 30 MG PO CP24: 30.0000 mg | ORAL_CAPSULE | Freq: Every day | ORAL | 0 refills | Status: DC

## 2017-10-10 NOTE — Telephone Encounter (Signed)
Rx has been sighned, left a message on pt phone to pick up Rx on the front desk

## 2017-10-10 NOTE — Telephone Encounter (Signed)
Rx has been printed waiting to be signed.

## 2017-10-10 NOTE — Telephone Encounter (Signed)
Refill OK

## 2017-10-13 ENCOUNTER — Telehealth: Payer: Self-pay

## 2017-10-13 NOTE — Telephone Encounter (Signed)
Pt Rx is waiting for pick up on the front desk. Left a message for pt to call back the office.

## 2017-10-13 NOTE — Telephone Encounter (Signed)
Called pt left a message for pt to return my call in the office in regards to her Rx refill for Adderall.

## 2017-10-17 ENCOUNTER — Other Ambulatory Visit: Payer: Self-pay | Admitting: Family Medicine

## 2017-12-01 ENCOUNTER — Other Ambulatory Visit: Payer: Self-pay | Admitting: *Deleted

## 2017-12-01 MED ORDER — BUPROPION HCL ER (XL) 300 MG PO TB24: 300.0000 mg | ORAL_TABLET | Freq: Every day | ORAL | 0 refills | Status: DC

## 2017-12-13 ENCOUNTER — Ambulatory Visit: Payer: Self-pay | Admitting: Emergency Medicine

## 2017-12-13 VITALS — BP 122/86 | HR 88 | Temp 98.4°F | Resp 17

## 2017-12-13 DIAGNOSIS — B9789 Other viral agents as the cause of diseases classified elsewhere: Secondary | ICD-10-CM

## 2017-12-13 DIAGNOSIS — J069 Acute upper respiratory infection, unspecified: Secondary | ICD-10-CM

## 2017-12-13 MED ORDER — GUAIFENESIN-CODEINE 100-10 MG/5ML PO SOLN: 5.0000 mL | Freq: Four times a day (QID) | ORAL | 0 refills | Status: DC | PRN

## 2017-12-13 MED ORDER — LEVOCETIRIZINE DIHYDROCHLORIDE 5 MG PO TABS: 5.0000 mg | ORAL_TABLET | Freq: Every day | ORAL | 0 refills | Status: DC

## 2017-12-13 MED ORDER — PREDNISONE 50 MG PO TABS: ORAL_TABLET | ORAL | 0 refills | Status: DC

## 2017-12-13 NOTE — Patient Instructions (Signed)

## 2017-12-13 NOTE — Progress Notes (Signed)
Subjective:     Tiffany Montes is a 62 y.o. female who presents for evaluation of symptoms of a URI. Symptoms include congestion, low grade fever, nasal congestion, non productive cough and sore throat. Onset of symptoms was 3 days ago, and has been unchanged since that time. Treatment to date: none.     Review of Systems Pertinent items are noted in HPI.   Objective:    BP 122/86 (BP Location: Right Arm, Patient Position: Sitting, Cuff Size: Normal)   Pulse 88   Temp 98.4 F (36.9 C) (Oral)   Resp 17   SpO2 99%  General appearance: alert, cooperative and appears stated age Head: Normocephalic, without obvious abnormality, atraumatic Ears: normal TM's and external ear canals both ears Nose: Nares normal. Septum midline. Mucosa normal. No drainage or sinus tenderness. Throat: lips, mucosa, and tongue normal; teeth and gums normal and No tonsiar exudate or erythema  Neck: no adenopathy Lungs: clear to auscultation bilaterally Heart: regular rate and rhythm Extremities: extremities normal, atraumatic, no cyanosis or edema Pulses: 2+ and symmetric   Assessment:    viral upper respiratory illness   Plan:    Discussed diagnosis and treatment of URI. Discussed the importance of avoiding unnecessary antibiotic therapy. Suggested symptomatic OTC remedies. Follow up as needed. Follow up in 1 week or as needed.   No operating heavy equipment while on Codeine

## 2017-12-16 ENCOUNTER — Telehealth: Payer: Self-pay

## 2017-12-17 NOTE — Telephone Encounter (Signed)
Pt returned call and she states that she is not feeling any better. She states that she is on day 2 of the prednisone and it makes her nervous but she said she will complete the course.

## 2017-12-24 ENCOUNTER — Encounter: Payer: Self-pay | Admitting: Family Medicine

## 2017-12-24 ENCOUNTER — Ambulatory Visit: Payer: BLUE CROSS/BLUE SHIELD | Admitting: Family Medicine

## 2017-12-24 VITALS — BP 110/80 | HR 92 | Temp 98.0°F | Wt 209.5 lb

## 2017-12-24 DIAGNOSIS — J019 Acute sinusitis, unspecified: Secondary | ICD-10-CM

## 2017-12-24 MED ORDER — AMOXICILLIN-POT CLAVULANATE 875-125 MG PO TABS: 1.0000 | ORAL_TABLET | Freq: Two times a day (BID) | ORAL | 0 refills | Status: DC

## 2017-12-24 NOTE — Patient Instructions (Signed)

## 2017-12-24 NOTE — Progress Notes (Signed)
Subjective:     Patient ID: Tiffany Montes, female   DOB: [birth year], 62 y.o.   MRN: [id number]  HPI Patient seen with three-week history of upper respiratory symptoms. Started with sore throat and she has since then had some ear pain, persistent nasal congestion, and cough. She developed laryngitis few days ago. She developed during the past week some greenish nasal discharge. Increased malaise. She went to urgent care about 11 days ago and prescribed some type of cough syrup and prednisone but took only two doses of prednisone but had increased anxiousness. No fever  Past Medical History:  Diagnosis Date  . Anxiety   . Attention deficit disorder   . Depression   . Gallstones 09/05/2011  . Hyperlipidemia   . Hypertension   . Migraines   . PONV (postoperative nausea and vomiting)   . Thyroid disease    hypothyroid   Past Surgical History:  Procedure Laterality Date  . ABDOMINAL HYSTERECTOMY  2003  . CHOLECYSTECTOMY  09/26/2011   Procedure: LAPAROSCOPIC CHOLECYSTECTOMY WITH INTRAOPERATIVE CHOLANGIOGRAM;  Surgeon: Haywood Lasso, MD;  Location: [facility];  Service: General;  Laterality: N/A;  . CYSTIC HYGROMA EXCISION      reports that  has never smoked. she has never used smokeless tobacco. She reports that she drinks alcohol. She reports that she does not use drugs. family history includes Cancer in her father; Diabetes in her father; Heart disease in her father. Allergies  Allergen Reactions  . Morphine Sulfate Nausea And Vomiting and Rash    REACTION: GI upset     Review of Systems  Constitutional: Positive for fatigue. Negative for chills and fever.  HENT: Positive for congestion, sinus pressure, sinus pain and sore throat.   Respiratory: Positive for cough.        Objective:   Physical Exam  Constitutional: She appears well-developed and well-nourished.  HENT:  Right Ear: External ear normal.  Left Ear: External ear normal.  Mouth/Throat: Oropharynx is clear and  moist.  Neck: Neck supple.  Cardiovascular: Normal rate and regular rhythm.  Pulmonary/Chest: Effort normal and breath sounds normal. No respiratory distress. She has no wheezes. She has no rales.  Lymphadenopathy:    She has no cervical adenopathy.       Assessment:     Probable acute sinusitis    Plan:     -Given duration of symptoms start Augmentin 875 mg twice daily for 10 days -Consider over-the-counter plain Mucinex -Touch base in 2 weeks if not resolving  Eulas Post MD [facility] at [facility]

## 2018-01-02 ENCOUNTER — Other Ambulatory Visit: Payer: Self-pay | Admitting: Family Medicine

## 2018-01-13 ENCOUNTER — Other Ambulatory Visit: Payer: Self-pay | Admitting: Family Medicine

## 2018-01-13 NOTE — Telephone Encounter (Signed)
Refill of Adderall  LOV 12/24/17  Dr. Elease Hashimoto  LOV when issue addressed  12/18/16  LRF 10/10/17  [id number] [place] [address]

## 2018-01-13 NOTE — Telephone Encounter (Signed)
Copied from [place] [id number]. Topic: Quick Communication - Rx Refill/Question >> Jan 13, 2018  1:11 PM Aurelio Brash B wrote: Medication: amphetamine-dextroamphetamine (ADDERALL XR) 30 MG 24 hr capsule   Has the patient contacted their pharmacy? No refills  (Agent: If no, request that the patient contact the pharmacy for the refill.)  Preferred Pharmacy (with phone number or street name):[id number] [place], [name]. AT [address] [place] [phone] (Phone) [phone] (Fax)       Agent: Please be advised that RX refills may take up to 3 business days. We ask that you follow-up with your pharmacy.

## 2018-01-14 ENCOUNTER — Other Ambulatory Visit: Payer: Self-pay

## 2018-01-14 MED ORDER — AMPHETAMINE-DEXTROAMPHET ER 30 MG PO CP24: 30.0000 mg | ORAL_CAPSULE | Freq: Every day | ORAL | 0 refills | Status: DC

## 2018-01-14 NOTE — Telephone Encounter (Signed)
Attempted to call patient.  1 refill ready to pick up.

## 2018-01-20 ENCOUNTER — Other Ambulatory Visit: Payer: Self-pay | Admitting: Family Medicine

## 2018-01-28 ENCOUNTER — Ambulatory Visit (INDEPENDENT_AMBULATORY_CARE_PROVIDER_SITE_OTHER): Payer: BLUE CROSS/BLUE SHIELD | Admitting: Family Medicine

## 2018-01-28 ENCOUNTER — Encounter: Payer: Self-pay | Admitting: Family Medicine

## 2018-01-28 VITALS — BP 140/80 | HR 67 | Temp 98.0°F | Ht 62.25 in | Wt 208.2 lb

## 2018-01-28 DIAGNOSIS — Z0000 Encounter for general adult medical examination without abnormal findings: Secondary | ICD-10-CM

## 2018-01-28 LAB — CBC WITH DIFFERENTIAL/PLATELET
BASOS ABS: 0 10*3/uL (ref 0.0–0.1)
Basophils Relative: 0.6 % (ref 0.0–3.0)
Eosinophils Absolute: 0.2 10*3/uL (ref 0.0–0.7)
Eosinophils Relative: 2.5 % (ref 0.0–5.0)
HCT: 38.6 % (ref 36.0–46.0)
Hemoglobin: 13.1 g/dL (ref 12.0–15.0)
LYMPHS ABS: 1.8 10*3/uL (ref 0.7–4.0)
Lymphocytes Relative: 29 % (ref 12.0–46.0)
MCHC: 34 g/dL (ref 30.0–36.0)
MCV: 86.1 fl (ref 78.0–100.0)
MONO ABS: 0.5 10*3/uL (ref 0.1–1.0)
Monocytes Relative: 8.3 % (ref 3.0–12.0)
NEUTROS PCT: 59.6 % (ref 43.0–77.0)
Neutro Abs: 3.7 10*3/uL (ref 1.4–7.7)
Platelets: 239 10*3/uL (ref 150.0–400.0)
RBC: 4.48 Mil/uL (ref 3.87–5.11)
RDW: 13.6 % (ref 11.5–15.5)
WBC: 6.2 10*3/uL (ref 4.0–10.5)

## 2018-01-28 LAB — LIPID PANEL
CHOL/HDL RATIO: 4
Cholesterol: 217 mg/dL — ABNORMAL HIGH (ref 0–200)
HDL: 61.8 mg/dL (ref >39.00)
LDL Cholesterol: 127 mg/dL — ABNORMAL HIGH (ref 0–99)
NonHDL: 154.84
Triglycerides: 138 mg/dL (ref 0.0–149.0)
VLDL: 27.6 mg/dL (ref 0.0–40.0)

## 2018-01-28 LAB — HEPATIC FUNCTION PANEL
ALBUMIN: 3.8 g/dL (ref 3.5–5.2)
ALT: 12 U/L (ref 0–35)
AST: 14 U/L (ref 0–37)
Alkaline Phosphatase: 58 U/L (ref 39–117)
BILIRUBIN DIRECT: 0.1 mg/dL (ref 0.0–0.3)
Total Bilirubin: 0.3 mg/dL (ref 0.2–1.2)
Total Protein: 6.3 g/dL (ref 6.0–8.3)

## 2018-01-28 LAB — BASIC METABOLIC PANEL
BUN: 14 mg/dL (ref 6–23)
CO2: 27 meq/L (ref 19–32)
Calcium: 9.1 mg/dL (ref 8.4–10.5)
Chloride: 103 mEq/L (ref 96–112)
Creatinine, Ser: 0.76 mg/dL (ref 0.40–1.20)
GFR: 81.7 mL/min (ref >60.00)
GLUCOSE: 112 mg/dL — AB (ref 70–99)
POTASSIUM: 3.8 meq/L (ref 3.5–5.1)
SODIUM: 138 meq/L (ref 135–145)

## 2018-01-28 LAB — TSH: TSH: 4.04 u[IU]/mL (ref 0.35–4.50)

## 2018-01-28 LAB — HEMOGLOBIN A1C: Hgb A1c MFr Bld: 6 % (ref 4.6–6.5)

## 2018-01-28 NOTE — Progress Notes (Signed)
Subjective:     Patient ID: Tiffany Montes, female   DOB: 06/17/1956, 62 y.o.   MRN: [id number]  HPI Patient here for physical exam. She sees gynecologist yearly and had Pap smear last year. She is due for repeat colonoscopy this year.  Her immunizations are up-to-date. She's had no history of hepatitis C screening. No specific risk factors other than age demographic. She has history of obesity, hypothyroidism, hyperlipidemia, hypertension, prediabetes by glucose last year of 115. She's had some steady weight gain in recent years. She is requesting nutrition referral to discuss strategies for weight loss.  Father had type 1 diabetes. No family history of premature heart disease  Past Medical History:  Diagnosis Date  . Anxiety   . Attention deficit disorder   . Depression   . Gallstones 09/05/2011  . Hyperlipidemia   . Hypertension   . Migraines   . PONV (postoperative nausea and vomiting)   . Thyroid disease    hypothyroid   Past Surgical History:  Procedure Laterality Date  . ABDOMINAL HYSTERECTOMY  2003  . CHOLECYSTECTOMY  09/26/2011   Procedure: LAPAROSCOPIC CHOLECYSTECTOMY WITH INTRAOPERATIVE CHOLANGIOGRAM;  Surgeon: Haywood Lasso, MD;  Location: [place];  Service: General;  Laterality: N/A;  . CYSTIC HYGROMA EXCISION      reports that she has never smoked. She has never used smokeless tobacco. She reports that she drinks alcohol. She reports that she does not use drugs. family history includes Cancer in her father; Diabetes in her father; Heart disease in her father. Allergies  Allergen Reactions  . Morphine Sulfate Nausea And Vomiting and Rash    REACTION: GI upset     Review of Systems  Constitutional: Negative for activity change, appetite change, fatigue, fever and unexpected weight change.  HENT: Negative for ear pain, hearing loss, sore throat and trouble swallowing.   Eyes: Negative for visual disturbance.  Respiratory: Negative for cough and shortness of  breath.   Cardiovascular: Negative for chest pain and palpitations.  Gastrointestinal: Negative for abdominal pain, blood in stool, constipation and diarrhea.  Endocrine: Negative for polydipsia and polyuria.  Genitourinary: Negative for dysuria and hematuria.  Musculoskeletal: Negative for arthralgias, back pain and myalgias.  Skin: Negative for rash.  Neurological: Negative for dizziness, syncope and headaches.  Hematological: Negative for adenopathy.  Psychiatric/Behavioral: Negative for confusion and dysphoric mood.       Objective:   Physical Exam  Constitutional: She is oriented to person, place, and time. She appears well-developed and well-nourished.  HENT:  Right Ear: External ear normal.  Left Ear: External ear normal.  Mouth/Throat: Oropharynx is clear and moist.  Neck: Neck supple.  Cardiovascular: Normal rate and regular rhythm.  Pulmonary/Chest: Effort normal and breath sounds normal. No respiratory distress. She has no wheezes. She has no rales.  Musculoskeletal: She exhibits no edema.  Lymphadenopathy:    She has no cervical adenopathy.  Neurological: She is alert and oriented to person, place, and time. No cranial nerve deficit.  Psychiatric: She has a normal mood and affect. Her behavior is normal.       Assessment:     Physical exam. The following issues were addressed    Plan:     -Patient will set up repeat colonoscopy -Set up referral to nutrition [place] screening lab work and include hepatitis C antibody. We'll also include hemoglobin A1c with prior fasting blood sugar 115 -She is strongly encouraged to lose weight and establish more consistent exercise -She plans to  continue with yearly GYN follow-up for Pap smears and mammograms  Eulas Post MD [facility] at [facility]

## 2018-01-28 NOTE — Patient Instructions (Addendum)
We will put in nutrition referral.  Set up repeat colonoscopy.

## 2018-01-29 LAB — HEPATITIS C ANTIBODY
HEP C AB: NONREACTIVE
SIGNAL TO CUT-OFF: 0.01 (ref <1.00)

## 2018-02-03 ENCOUNTER — Other Ambulatory Visit: Payer: Self-pay | Admitting: Family Medicine

## 2018-02-04 NOTE — Telephone Encounter (Signed)
Refill for 6 months. 

## 2018-02-04 NOTE — Telephone Encounter (Signed)
Last office visit 01/28/18  And last refill 07/09/17.  Okay to fill?

## 2018-02-05 NOTE — Telephone Encounter (Signed)
Rx done. 

## 2018-02-19 ENCOUNTER — Ambulatory Visit: Payer: Self-pay | Admitting: *Deleted

## 2018-02-19 NOTE — Telephone Encounter (Signed)
Called in c/o being very dizzy and unsteady.   Any movement makes the vertigo worse.  She has a history of the positional vertigo where she does the head positioning exercises to make the dizziness stop.   The "crystals" in my ear.   The exercises are not working this time.    I encouraged her to go to the ED due to the severe dizziness and feeling unsteady and that the exercises are not working to relieve the dizziness.  She was undecided whether she was going to go or not.  "I just spent  12 hours in the ED with my mother 2 weeks ago".    "I really don''t want to go".   "I really don't think I'm having a stroke".   I'm not having any of the symptoms.  When my daughter gets home in a few minutes I will decide then.   She thanked me for my help.  I did instruct her in the s/s to look for and to go to the ED right away.   She verbalized understanding.  I routed a note to Dr. Erick Blinks office. Reason for Disposition . [1] Dizziness (vertigo) present now AND [2] age > 47  (Exception: prior physician evaluation for this AND no different/worse than usual)  Answer Assessment - Initial Assessment Questions 1. DESCRIPTION: "Describe your dizziness."     Like I'm standing on a boat.  The room is spinning.  Vomited 3 times.  I'm doing the exercises I was instructed to do and they are not helping.   I've had the crystals in my ears before. 2. VERTIGO: "Do you feel like either you or the room is spinning or tilting?"      Yes 3. LIGHTHEADED: "Do you feel lightheaded?" (e.g., somewhat faint, woozy, weak upon standing)     My left ear I don't hear as well.   I lightheaded and weak. 4. SEVERITY: "How bad is it?"  "Can you walk?"   - MILD - Feels unsteady but walking normally.   - MODERATE - Feels very unsteady when walking, but not falling; interferes with normal activities (e.g., school, work) .   - SEVERE - Unable to walk without falling (requires assistance).     As long as I don't move it's fine but as  soon as I move it starts. 5. ONSET:  "When did the dizziness begin?"     The day before I looked up and then when I looked down I got dizzy.   Then I woke up with it this morning with it bad. 6. AGGRAVATING FACTORS: "Does anything make it worse?" (e.g., standing, change in head position)     Changing position makes it worse. 7. CAUSE: "What do you think is causing the dizziness?"     I think it's the crystals in my ears but this time it's worse. 8. RECURRENT SYMPTOM: "Have you had dizziness before?" If so, ask: "When was the last time?" "What happened that time?"     Yes   This time is worse. 9. OTHER SYMPTOMS: "Do you have any other symptoms?" (e.g., headache, weakness, numbness, vomiting, earache)     Weakness, nausea with vomiting X3.    10. PREGNANCY: "Is there any chance you are pregnant?" "When was your last menstrual period?"       Not asked due to age  Protocols used: DIZZINESS - VERTIGO-A-AH

## 2018-02-19 NOTE — Telephone Encounter (Signed)
Monitor for ER arrival 

## 2018-02-20 NOTE — Telephone Encounter (Signed)
I spoke with pt this morning, she is doing much better, no more dizziness, says it was inner ear type, has had this problem in the past. She will follow with Dr. Elease Hashimoto if needed.

## 2018-02-23 ENCOUNTER — Telehealth: Payer: Self-pay | Admitting: Family Medicine

## 2018-02-23 MED ORDER — AMPHETAMINE-DEXTROAMPHET ER 30 MG PO CP24: 30.0000 mg | ORAL_CAPSULE | Freq: Every day | ORAL | 0 refills | Status: DC

## 2018-02-23 NOTE — Telephone Encounter (Signed)
Last refill 10/10/17 and office visit 01/28/18.  Okay to refill?

## 2018-02-23 NOTE — Telephone Encounter (Signed)
Copied from [place] [phone]. Topic: Quick Communication - See Telephone Encounter >> Feb 23, 2018  1:46 PM Ether Griffins B wrote: CRM for notification. See Telephone encounter for: 02/23/18.  Pt requesting refill on amphetamine-dextroamphetamine (ADDERALL XR) 30 MG 24 hr capsule. She only has one left.

## 2018-02-23 NOTE — Telephone Encounter (Signed)
Prescriptions sent by computer.

## 2018-02-24 ENCOUNTER — Telehealth: Payer: Self-pay | Admitting: Family Medicine

## 2018-02-24 ENCOUNTER — Encounter: Payer: Self-pay | Admitting: Family Medicine

## 2018-02-24 DIAGNOSIS — R7303 Prediabetes: Secondary | ICD-10-CM | POA: Insufficient documentation

## 2018-02-24 NOTE — Telephone Encounter (Signed)
Copied from [place] [id number]. Topic: Referral - Request >> Feb 24, 2018  9:06 AM Scherrie Gerlach wrote: Reason for CRM: pam with [facility] called to request the referral for the pt needs to be changed.  They do not see Z dx codes, and do not do any "physical exam" (as noted on referral) The reason for the referral needs to be clearer. Please resubmit with the correct dx code and reason pt needs to be seen. thanks

## 2018-02-24 NOTE — Telephone Encounter (Signed)
We can use codes for hypertension (I 10), hyperlipidemia (E78.5) and prediabetes (R73.03)

## 2018-02-25 ENCOUNTER — Ambulatory Visit: Payer: BLUE CROSS/BLUE SHIELD | Admitting: Family Medicine

## 2018-02-25 ENCOUNTER — Encounter: Payer: Self-pay | Admitting: Family Medicine

## 2018-02-25 ENCOUNTER — Ambulatory Visit: Payer: Self-pay

## 2018-02-25 VITALS — BP 120/84 | HR 75 | Temp 98.3°F | Wt 208.1 lb

## 2018-02-25 DIAGNOSIS — M79661 Pain in right lower leg: Secondary | ICD-10-CM | POA: Diagnosis not present

## 2018-02-25 DIAGNOSIS — I83813 Varicose veins of bilateral lower extremities with pain: Secondary | ICD-10-CM | POA: Diagnosis not present

## 2018-02-25 DIAGNOSIS — M7042 Prepatellar bursitis, left knee: Secondary | ICD-10-CM | POA: Diagnosis not present

## 2018-02-25 NOTE — Telephone Encounter (Signed)
Patient called in with c/o "knee swelling." She says "my right knee has been swelling for about the past 2 weeks. It's not swollen on the outside, but is puffy and has heat to it, it's painful. I am on my feet 10 hours a day at [employer] walking on the concrete floor and by the time I get off work, my knee is hurting really bad, about a 7-8. It's hard to climb the ladder or even take steps. I have been icing it and taking ibuprofen for the swelling and pain, but the swelling and pain is getting worse." I asked about other symptoms, she denies. According to protocol, see PCP within 3 days, appointment scheduled for today at 1445 with Dr. Elease Hashimoto, care advice given, patient verbalized understanding.   Reason for Disposition . [1] MODERATE pain (e.g., interferes with normal activities, limping) AND [2] present > 3 days  Answer Assessment - Initial Assessment Questions 1. LOCATION: "Where is the swelling located?"  (e.g., left, right, both knees)     Right knee 2. SIZE and DESCRIPTION: "What does the swelling look like?"  (e.g., entire knee, localized)     Puffy 3. ONSET: "When did the swelling start?" "Does it come and go, or is it there all the time?"     Ongoing x 2 weeks 4. PAIN: "Is there any pain?" If so, ask: "How bad is it?" (Scale 1-10; or mild, moderate, severe)     7-8 5. SETTING: "Has there been any recent work, exercise or other activity that involved that part of the body?"      No 6. AGGRAVATING FACTORS: "What makes the knee swelling worse?" (e.g., walking, climbing stairs, running)     On my feet all the time, going up stairs 7. ASSOCIATED SYMPTOMS: "Is there any pain or redness?"     Redness, warmth 8. OTHER SYMPTOMS: "Do you have any other symptoms?" (e.g., chest pain, difficulty breathing, fever, calf pain)     Denies 9. PREGNANCY: "Is there any chance you are pregnant?" "When was your last menstrual period?"     No  Protocols used: KNEE SWELLING-A-AH

## 2018-02-25 NOTE — Patient Instructions (Signed)
Phlebitis Phlebitis is soreness and swelling (inflammation) of a vein. This can occur in your arms, legs, or torso (trunk), as well as deeper inside your body. Phlebitis is usually not serious when it occurs close to the surface of the body. However, it can cause serious problems when it occurs in a vein deeper inside the body. What are the causes? Phlebitis can be triggered by various things, including:  Reduced blood flow through your veins. This can happen with: ? Bed rest over a long period. ? Long-distance travel. ? Injury. ? Surgery. ? Being overweight (obese) or pregnant.  Having an IV tube put in the vein and getting certain medicines through the vein.  Cancer and cancer treatment.  Use of illegal drugs taken through the vein.  Inflammatory diseases.  Inherited (genetic) diseases that increase the risk of blood clots.  Hormone therapy, such as birth control pills.  What are the signs or symptoms?  Red, tender, swollen, and painful area on your skin. Usually, the area will be long and narrow.  Firmness along the center of the affected area. This can indicate that a blood clot has formed.  Low-grade fever. How is this diagnosed? A health care provider can usually diagnose phlebitis by examining the affected area and asking about your symptoms. To check for infection or blood clots, your health care provider may order blood tests or an ultrasound exam of the area. Blood tests and your family history may also indicate if you have an underlying genetic disease that causes blood clots. Occasionally, a piece of tissue is taken from the body (biopsy sample) if an unusual cause of phlebitis is suspected. How is this treated? Treatment will vary depending on the severity of the condition and the area of the body affected. Treatment may include:  Use of a warm compress or heating pad.  Use of compression stockings or bandages.  Anti-inflammatory medicines.  Removal of any IV  tube that may be causing the problem.  Medicines that kill germs (antibiotics) if an infection is present.  Blood-thinning medicines if a blood clot is suspected or present.  In rare cases, surgery may be needed to remove damaged sections of vein.  Follow these instructions at home:  Only take over-the-counter or prescription medicines as directed by your health care provider. Take all medicines exactly as prescribed.  Raise (elevate) the affected area above the level of your heart as directed by your health care provider.  Apply a warm compress or heating pad to the affected area as directed by your health care provider. Do not sleep with the heating pad.  Use compression stockings or bandages as directed. These will speed healing and prevent the condition from coming back.  If you are on blood thinners: ? Get follow-up blood tests as directed by your health care provider. ? Check with your health care provider before using any new medicines. ? Carry a medical alert card or wear your medical alert jewelry to show that you are on blood thinners.  For phlebitis in the legs: ? Avoid prolonged standing or bed rest. ? Keep your legs moving. Raise your legs when sitting or lying.  Do not smoke.  Women, particularly those over the age of 35, should consider the risks and benefits of taking the contraceptive pill. This kind of hormone treatment can increase your risk for blood clots.  Follow up with your health care provider as directed. Contact a health care provider if:  You have unusual bruising or any   bleeding problems.  Your swelling or pain in the affected area is not improving.  You are on anti-inflammatory medicine, and you develop belly (abdominal) pain. Get help right away if:  You have a sudden onset of chest pain or difficulty breathing.  You have a fever or persistent symptoms for more than 2-3 days.  You have a fever and your symptoms suddenly get worse. This  information is not intended to replace advice given to you by your health care provider. Make sure you discuss any questions you have with your health care provider. Document Released: 09/17/2001 Document Revised: 02/29/2016 Document Reviewed: 05/31/2013 Elsevier Interactive Patient Education  2017 Elsevier Inc.  We are setting up venous doppler to rule out clot.

## 2018-02-25 NOTE — Progress Notes (Signed)
  Subjective:     Patient ID: Tiffany Montes, female   DOB: 1956-04-04, 62 y.o.   MRN: [id number]  HPI Patient seen with approximate 2 week history of right calf pain. She has some varicose veins in both legs but has noticed some slight worse with touch and tenderness along the medial proximal calf region. Slightly increased edema. She has used some ice packs and has taken ibuprofen with minimal relief. Her job requires lots of walking and being on her feet.  No history of DVT. No dyspnea. No pleuritic pain. No chest pain. No long travels recently. She is on Estrace per GYN. Nonsmoker.  She also states she's had some puffiness left anterior knee following a remote injury. Nontender. No warmth. No erythema.  Past Medical History:  Diagnosis Date  . Anxiety   . Attention deficit disorder   . Depression   . Gallstones 09/05/2011  . Hyperlipidemia   . Hypertension   . Migraines   . PONV (postoperative nausea and vomiting)   . Thyroid disease    hypothyroid   Past Surgical History:  Procedure Laterality Date  . ABDOMINAL HYSTERECTOMY  2003  . CHOLECYSTECTOMY  09/26/2011   Procedure: LAPAROSCOPIC CHOLECYSTECTOMY WITH INTRAOPERATIVE CHOLANGIOGRAM;  Surgeon: Haywood Lasso, MD;  Location: [facility];  Service: General;  Laterality: N/A;  . CYSTIC HYGROMA EXCISION      reports that she has never smoked. She has never used smokeless tobacco. She reports that she drinks alcohol. She reports that she does not use drugs. family history includes Cancer in her father; Diabetes in her father; Heart disease in her father. Allergies  Allergen Reactions  . Morphine Sulfate Nausea And Vomiting and Rash    REACTION: GI upset     Review of Systems  Constitutional: Negative for chills and fever.  Respiratory: Negative for cough and shortness of breath.   Cardiovascular: Negative for chest pain.  Gastrointestinal: Negative for abdominal pain.  Skin: Negative for rash.  Neurological: Negative for  weakness and numbness.  Hematological: Negative for adenopathy.       Objective:   Physical Exam  Constitutional: She appears well-developed and well-nourished.  Cardiovascular: Normal rate and regular rhythm.  Pulmonary/Chest: Effort normal and breath sounds normal. She has no wheezes. She has no rales.  Musculoskeletal:  Left knee full range of motion. She has some mild prepatellar bursa edema but no warmth or tenderness. No erythema  Right calf reveals some varicose veins. Nonspecific diffuse tenderness. No pitting edema. She has multiple varicosities in both legs       Assessment:     #1   2 week history of right calf pain. Suspect most likely some superficial phlebitis. Rule out DVT. She is postmenopausal and on estrogen replacement  #2 asymptomatic left prepatellar bursal swelling. No signs of secondary infection.    Plan:     -Setup venous Doppler right lower extremity -If negative will recommend venous compression garments- knee high -She is also encouraged to lose some weight. -Will suggest she discuss with GYN whether she can taper off Estrace soon  Eulas Post MD [facility] at [facility]

## 2018-02-25 NOTE — Telephone Encounter (Signed)
Codes given to [facility]

## 2018-02-27 ENCOUNTER — Ambulatory Visit (HOSPITAL_COMMUNITY)
Admission: RE | Admit: 2018-02-27 | Discharge: 2018-02-27 | Disposition: A | Discharge status: Home / Self Care | Payer: BLUE CROSS/BLUE SHIELD | Source: Ambulatory Visit | Attending: Cardiology | Admitting: Cardiology

## 2018-02-27 DIAGNOSIS — M79661 Pain in right lower leg: Secondary | ICD-10-CM

## 2018-04-02 ENCOUNTER — Ambulatory Visit: Payer: BLUE CROSS/BLUE SHIELD | Admitting: Registered"

## 2018-04-11 ENCOUNTER — Other Ambulatory Visit: Payer: Self-pay | Admitting: Family Medicine

## 2018-04-17 ENCOUNTER — Other Ambulatory Visit: Payer: Self-pay | Admitting: Family Medicine

## 2018-04-20 ENCOUNTER — Other Ambulatory Visit: Payer: Self-pay | Admitting: Family Medicine

## 2018-05-19 ENCOUNTER — Other Ambulatory Visit: Payer: Self-pay | Admitting: Family Medicine

## 2018-05-19 ENCOUNTER — Telehealth: Payer: Self-pay | Admitting: *Deleted

## 2018-05-19 MED ORDER — SERTRALINE HCL 50 MG PO TABS: 50.0000 mg | ORAL_TABLET | Freq: Every day | ORAL | 3 refills | Status: DC

## 2018-05-19 NOTE — Telephone Encounter (Signed)
Discontinue Lexapro 10 mg. Start sertraline 50 mg once daily #30 with 3 refills.. No need to taper off since we are switching one SSRI for another

## 2018-05-19 NOTE — Telephone Encounter (Signed)
Patient is requesting an prescription. She is paying $20 for escitalopram 10 mg  Patient is requesting the lower-cost alternative sertraline 50 mg $7.  Last refill of escitalopram 08/30/16 Last office visit 02/25/18  Okay to change?

## 2018-05-26 ENCOUNTER — Other Ambulatory Visit: Payer: Self-pay | Admitting: *Deleted

## 2018-05-26 MED ORDER — AMPHETAMINE-DEXTROAMPHET ER 30 MG PO CP24: 30.0000 mg | ORAL_CAPSULE | Freq: Every day | ORAL | 0 refills | Status: DC

## 2018-05-26 NOTE — Telephone Encounter (Signed)
Refills sent

## 2018-05-26 NOTE — Telephone Encounter (Signed)
Last refill 02/23/18 Last office visit 02/25/18 Okay to fill?

## 2018-05-26 NOTE — Telephone Encounter (Signed)
Copied from [place] [id number]. Topic: General - Other >> May 26, 2018 12:32 PM  Stare wrote: Pt req refill   amphetamine-dextroamphetamine (ADDERALL XR) 30 MG 24 hr capsule   [facility] [place]

## 2018-05-27 ENCOUNTER — Encounter: Payer: Self-pay | Admitting: Family Medicine

## 2018-05-27 ENCOUNTER — Ambulatory Visit (INDEPENDENT_AMBULATORY_CARE_PROVIDER_SITE_OTHER): Payer: BLUE CROSS/BLUE SHIELD | Admitting: Family Medicine

## 2018-05-27 VITALS — BP 140/90 | HR 76 | Temp 98.4°F | Wt 198.4 lb

## 2018-05-27 DIAGNOSIS — I10 Essential (primary) hypertension: Secondary | ICD-10-CM

## 2018-05-27 DIAGNOSIS — M549 Dorsalgia, unspecified: Secondary | ICD-10-CM | POA: Diagnosis not present

## 2018-05-27 DIAGNOSIS — Z23 Encounter for immunization: Secondary | ICD-10-CM

## 2018-05-27 MED ORDER — CYCLOBENZAPRINE HCL 5 MG PO TABS: ORAL_TABLET | ORAL | 1 refills | Status: DC

## 2018-05-27 NOTE — Progress Notes (Signed)
Subjective:     Patient ID: Tiffany Montes, female   DOB: 24-Dec-1955, 62 y.o.   MRN: [id number]  HPI Patient seen with right shoulder pain. Her pain is actually right periscapular region. Onset a couple weeks ago. Her job requires a lot of repetitive overhead lifting. She denies specific injury. Pain is mostly upper back region around the superior aspect of the scapula. No neck pain. Occasional radiation toward the deltoid region. Pain is worse at night. She's had difficulty getting couple position at times. No pleuritic pain. No dyspnea. No chest pains. No cough. No upper extremity numbness or weakness  She's tried some topical ice and some type of cream with mild relief. She also has taken occasional Aleve. She feels her muscles are "tightening up" at night.  She has questions regarding new shingles vaccine. She's had previous Zostavax years ago.  Hypertension treated with losartan HCTZ. Compliant with therapy. Blood pressure up-to-date. She thinks this is partially related to her current pain. No headaches. No dizziness. No chest pains. No regular alcohol use.  Past Medical History:  Diagnosis Date  . Anxiety   . Attention deficit disorder   . Depression   . Gallstones 09/05/2011  . Hyperlipidemia   . Hypertension   . Migraines   . PONV (postoperative nausea and vomiting)   . Thyroid disease    hypothyroid   Past Surgical History:  Procedure Laterality Date  . ABDOMINAL HYSTERECTOMY  2003  . CHOLECYSTECTOMY  09/26/2011   Procedure: LAPAROSCOPIC CHOLECYSTECTOMY WITH INTRAOPERATIVE CHOLANGIOGRAM;  Surgeon: Haywood Lasso, MD;  Location: [facility];  Service: General;  Laterality: N/A;  . CYSTIC HYGROMA EXCISION      reports that she has never smoked. She has never used smokeless tobacco. She reports that she drinks alcohol. She reports that she does not use drugs. family history includes Cancer in her father; Diabetes in her father; Heart disease in her father. Allergies   Allergen Reactions  . Morphine Sulfate Nausea And Vomiting and Rash    REACTION: GI upset       Review of Systems  Constitutional: Negative for chills, fever and unexpected weight change.  Respiratory: Negative for cough and shortness of breath.   Cardiovascular: Negative for chest pain.  Musculoskeletal: Positive for back pain. Negative for neck pain and neck stiffness.  Skin: Negative for rash.  Neurological: Negative for weakness.       Objective:   Physical Exam  Constitutional: She appears well-developed and well-nourished.  Cardiovascular: Normal rate and regular rhythm.  Pulmonary/Chest: Effort normal and breath sounds normal. She has no wheezes. She has no rales.  Musculoskeletal:  Full range of motion right shoulder. No pain with internal rotation, external rotation, or abduction.  No localized tenderness       Assessment:     #1 Right periscapular pain. Suspect muscular. No rotator cuff weakness  #2 hypertension. Borderline elevation today possibly related to current pain  #3 questions regarding new shingles vaccine    Plan:     -Continue Aleve as needed. -Short-term trial of Flexeril 5 mg 1-2 daily at bedtime as needed -Consider sports medicine referral if not improving in a couple weeks -Check with insurance coverage regarding new shingles vaccine -Monitor blood pressure closely and be in touch if consistently greater than 140/90 -She has been able to lose some weight since her physical and is encouraged to continue with weight loss efforts which should help her blood pressure  Eulas Post MD [facility]  at [facility]

## 2018-05-27 NOTE — Patient Instructions (Signed)
Continue with heat or ice if they help  Use the Flexeril at night as needed.  Touch base in 2-3 weeks if no better  Monitor blood pressure and be in touch if consistently > 140/90  Consider new shingles vaccine (Shingrix) and let us know if covered by insurance.

## 2018-06-13 ENCOUNTER — Other Ambulatory Visit: Payer: Self-pay | Admitting: Family Medicine

## 2018-07-02 ENCOUNTER — Other Ambulatory Visit: Payer: Self-pay | Admitting: Family Medicine

## 2018-07-15 ENCOUNTER — Other Ambulatory Visit: Payer: Self-pay | Admitting: Family Medicine

## 2018-07-15 ENCOUNTER — Other Ambulatory Visit: Payer: Self-pay | Admitting: Obstetrics & Gynecology

## 2018-07-15 DIAGNOSIS — Z1231 Encounter for screening mammogram for malignant neoplasm of breast: Secondary | ICD-10-CM

## 2018-07-22 ENCOUNTER — Ambulatory Visit
Admission: RE | Admit: 2018-07-22 | Discharge: 2018-07-22 | Disposition: A | Discharge status: Home / Self Care | Payer: BLUE CROSS/BLUE SHIELD | Source: Ambulatory Visit | Attending: Obstetrics & Gynecology | Admitting: Obstetrics & Gynecology

## 2018-07-22 DIAGNOSIS — Z1231 Encounter for screening mammogram for malignant neoplasm of breast: Secondary | ICD-10-CM | POA: Diagnosis not present

## 2018-07-22 DIAGNOSIS — Z01419 Encounter for gynecological examination (general) (routine) without abnormal findings: Secondary | ICD-10-CM | POA: Diagnosis not present

## 2018-07-22 DIAGNOSIS — Z6836 Body mass index (BMI) 36.0-36.9, adult: Secondary | ICD-10-CM | POA: Diagnosis not present

## 2018-07-23 ENCOUNTER — Other Ambulatory Visit: Payer: Self-pay | Admitting: Obstetrics & Gynecology

## 2018-07-23 DIAGNOSIS — R928 Other abnormal and inconclusive findings on diagnostic imaging of breast: Secondary | ICD-10-CM

## 2018-07-28 ENCOUNTER — Other Ambulatory Visit: Payer: BLUE CROSS/BLUE SHIELD

## 2018-07-28 ENCOUNTER — Ambulatory Visit
Admission: RE | Admit: 2018-07-28 | Discharge: 2018-07-28 | Disposition: A | Discharge status: Home / Self Care | Payer: BLUE CROSS/BLUE SHIELD | Source: Ambulatory Visit | Attending: Obstetrics & Gynecology | Admitting: Obstetrics & Gynecology

## 2018-07-28 ENCOUNTER — Other Ambulatory Visit: Payer: Self-pay | Admitting: Obstetrics & Gynecology

## 2018-07-28 ENCOUNTER — Other Ambulatory Visit: Payer: Self-pay | Admitting: Family Medicine

## 2018-07-28 DIAGNOSIS — N6320 Unspecified lump in the left breast, unspecified quadrant: Secondary | ICD-10-CM

## 2018-07-28 DIAGNOSIS — N6322 Unspecified lump in the left breast, upper inner quadrant: Secondary | ICD-10-CM | POA: Diagnosis not present

## 2018-07-28 DIAGNOSIS — R922 Inconclusive mammogram: Secondary | ICD-10-CM | POA: Diagnosis not present

## 2018-07-28 DIAGNOSIS — R928 Other abnormal and inconclusive findings on diagnostic imaging of breast: Secondary | ICD-10-CM

## 2018-07-28 NOTE — Telephone Encounter (Signed)
Refill once 

## 2018-07-28 NOTE — Telephone Encounter (Signed)
Last OV 05/27/18, No future OV  Not currently on the medication list. Has a history of using in the past for allergies. Please advise if okay to fill.

## 2018-07-31 ENCOUNTER — Other Ambulatory Visit: Payer: Self-pay | Admitting: Obstetrics & Gynecology

## 2018-07-31 ENCOUNTER — Ambulatory Visit
Admission: RE | Admit: 2018-07-31 | Discharge: 2018-07-31 | Disposition: A | Discharge status: Home / Self Care | Payer: BLUE CROSS/BLUE SHIELD | Source: Ambulatory Visit | Attending: Obstetrics & Gynecology | Admitting: Obstetrics & Gynecology

## 2018-07-31 DIAGNOSIS — N6320 Unspecified lump in the left breast, unspecified quadrant: Secondary | ICD-10-CM

## 2018-07-31 DIAGNOSIS — N6324 Unspecified lump in the left breast, lower inner quadrant: Secondary | ICD-10-CM | POA: Diagnosis not present

## 2018-07-31 DIAGNOSIS — C50212 Malignant neoplasm of upper-inner quadrant of left female breast: Secondary | ICD-10-CM | POA: Diagnosis not present

## 2018-08-05 ENCOUNTER — Telehealth: Payer: Self-pay | Admitting: Oncology

## 2018-08-05 NOTE — Telephone Encounter (Signed)
Called patient unable to leave a voice mail, sent patient a letter and e-mail to call back to confirm the appointment, packet emailed to patient

## 2018-08-06 ENCOUNTER — Telehealth: Payer: Self-pay | Admitting: Oncology

## 2018-08-06 NOTE — Telephone Encounter (Signed)
LVM referencing upcoming afternoon [facility] appointment for 11/6, already emailed packet, advised would mail one as well and left my call back number

## 2018-08-10 ENCOUNTER — Telehealth: Payer: Self-pay | Admitting: Oncology

## 2018-08-10 NOTE — Telephone Encounter (Signed)
Spoke to patient to confirm afternoon [facility] appointment for 11/6, packet emailed to patient again

## 2018-08-11 ENCOUNTER — Encounter: Payer: Self-pay | Admitting: *Deleted

## 2018-08-11 DIAGNOSIS — Z170 Estrogen receptor positive status [ER+]: Principal | ICD-10-CM

## 2018-08-11 DIAGNOSIS — C50212 Malignant neoplasm of upper-inner quadrant of left female breast: Secondary | ICD-10-CM | POA: Insufficient documentation

## 2018-08-11 NOTE — Progress Notes (Addendum)
[facility]  [phone] [phone]     ID: Tiffany Montes DOB: 02/07/1956  MR#: [id number]  CSN#:[id number]  Patient Care Team: Burchette, Bruce W, MD as PCP - General Ingram, Haywood, MD as Consulting Physician (General Surgery) ,  C, MD as Consulting Physician (Oncology) Moody, John, MD as Consulting Physician (Radiation Oncology) Neal, W Ronald, MD as Consulting Physician (Obstetrics and Gynecology) OTHER MD:   CHIEF COMPLAINT: Estrogen receptor positive breast cancer, HER-2 amplified  CURRENT TREATMENT: Awaiting definitive surgery   HISTORY OF CURRENT ILLNESS: "Tiffany Montes" had routine screening mammography on 07/22/2018 showing a possible abnormality in the left breast. She underwent unilateral diagnostic mammography with tomography and left breast ultrasonography at [facility] on 07/28/2018 showing: Breast density Category C; a 7 x 8 x 8 mm hypoechoic mass in the left breast lower inner quadrant, consistent with a complex cyst.. No abnormal lymph nodes in the left axilla.  Accordingly on 07/31/2018 she proceeded to biopsy of the left breast area in question. The pathology from this procedure showed ([id number]): Invasive Ductal Carcinoma, Grade 3. Prognostic indicators significant for: estrogen receptor, 95% positive and progesterone receptor, 85% positive, both with strong staining intensity. Proliferation marker Ki67 at 75%. HER2 positive by immunohistochemistry, 3+.   The patient's subsequent history is as detailed below.  INTERVAL HISTORY: "Tiffany Montes" was evaluated in the multidisciplinary breast cancer clinic on 08/16/2018 accompanied by her daughter, Tiffany Montes. Her case was also presented at the multidisciplinary breast cancer conference on the same day. At that time a preliminary plan was proposed: Breast conserving surgery with sentinel lymph node sampling and port placement, to be followed by adjuvant chemotherapy and  anti-HER-2 treatment, then radiation.  Antiestrogens would follow at the end of local treatment   REVIEW OF SYSTEMS: There were no specific symptoms leading to the original screening mammogram, which was routinely scheduled. The patient denies unusual headaches, visual changes, nausea, vomiting, stiff neck, dizziness, or gait imbalance. There has been no cough, phlegm production, or pleurisy, no chest pain or pressure, and no change in bowel or bladder habits. The patient denies fever, rash, bleeding, unexplained fatigue or unexplained weight loss. A detailed review of systems was otherwise entirely negative.   PAST MEDICAL HISTORY: Past Medical History:  Diagnosis Date  . Anxiety   . Attention deficit disorder   . Depression   . Gallstones 09/05/2011  . Hyperlipidemia   . Hypertension   . Migraines   . PONV (postoperative nausea and vomiting)   . Thyroid disease    hypothyroid    PAST SURGICAL HISTORY: Past Surgical History:  Procedure Laterality Date  . ABDOMINAL HYSTERECTOMY  2003  . BREAST BIOPSY Left 2014   benign  . CHOLECYSTECTOMY  09/26/2011   Procedure: LAPAROSCOPIC CHOLECYSTECTOMY WITH INTRAOPERATIVE CHOLANGIOGRAM;  Surgeon: Christian J Streck, MD;  Location: [facility] OR;  Service: General;  Laterality: N/A;  . CYSTIC HYGROMA EXCISION      FAMILY HISTORY Family History  Problem Relation Age of Onset  . Diabetes Father   . Cancer Father        lung   . Heart disease Father    She notes that her father died from CHF at age 76.  He was diagnosed with lung cancer at age 60. Patients' mother is 91 years old as of November 2019. The patient has 1 brother and 2 sisters. Patient denies a family history of ovarian or breast cancer.  GYNECOLOGIC HISTORY:  No LMP recorded. Patient has had a hysterectomy.   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 She is divorced and lives by herself with her 2 dogs. Her daughter Tiffany Montes Tiffany Montes is a Mechanic in [place], [place].  The patient's son, Tiffany Montes is a cook and lives in [place]. The patient has 2 grandchildren and 1 great-grand child.  She is not a church or tender     ADVANCED DIRECTIVES: Her Healthcare Power of Attorney is her daughter, Tiffany Montes, [phone].     HEALTH MAINTENANCE: Social History   Tobacco Use  . Smoking status: Never Smoker  . Smokeless tobacco: Never Used  Substance Use Topics  . Alcohol use: Yes    Comment: 1-2 a week and reports not every week  . Drug use: No     Colonoscopy: January 2013  PAP: 1 month ago  Bone density: Yes, 2 years ago   Allergies  Allergen Reactions  . Morphine Sulfate Nausea And Vomiting and Rash    REACTION: GI upset    Current Outpatient Medications  Medication Sig Dispense Refill  . amphetamine-dextroamphetamine (ADDERALL XR) 30 MG 24 hr capsule Take 1 capsule (30 mg total) by mouth daily. 30 capsule 0  . levothyroxine (SYNTHROID, LEVOTHROID) 125 MCG tablet TAKE 1 TABLET BY MOUTH EVERY DAY 90 tablet 1  . losartan-hydrochlorothiazide (HYZAAR) 50-12.5 MG tablet TAKE 1 TABLET BY MOUTH EVERY DAY 90 tablet 1  . Multiple Vitamins-Minerals (MULTIVITAMINS THER. W/MINERALS) TABS Take 1 tablet by mouth daily.      . SUMAtriptan (IMITREX) 100 MG tablet TAKE 1 TABLET EVERY DAY AS NEEDED FOR MIGRAINE, MAY REPEAT IN 24 HOURS 9 tablet 2  . augmented betamethasone dipropionate (DIPROLENE-AF) 0.05 % cream APPLY TO AFFECTED AREA TWICE A DAY (Patient not taking: Reported on 08/12/2018) 30 g 2  . benzonatate (TESSALON) 200 MG capsule TAKE 1 CAPSULE AS NEEDED FOR ALLERGIES(COUGH) (Patient not taking: Reported on 08/12/2018) 60 capsule 0  . buPROPion (WELLBUTRIN XL) 300 MG 24 hr tablet TAKE 1 TABLET BY MOUTH  EVERY DAY 90 tablet 0  . clonazePAM (KLONOPIN) 0.5 MG tablet TAKE 1 TABLET AT BEDTIME AS NEEDED (Patient not taking: Reported on 08/12/2018) 30 tablet 5  . cyclobenzaprine (FLEXERIL) 5 MG tablet Take one to two at night as needed for upper back pain. (Patient not taking: Reported on 08/12/2018) 30 tablet 1  . estradiol (ESTRACE) 1 MG tablet Take 1 mg by mouth daily. Per Ron Neal, MD, OB/GYN    . fluticasone (FLONASE) 50 MCG/ACT nasal spray SPRAY 2 SPRAYS INTO EACH NOSTRIL EVERY DAY (Patient not taking: Reported on 08/12/2018) 16 g 2  . sertraline (ZOLOFT) 50 MG tablet TAKE 1 TABLET BY MOUTH EVERY DAY (Patient not taking: Reported on 08/12/2018) 90 tablet 2   No current facility-administered medications for this visit.     OBJECTIVE: Middle-aged white woman who appears stated age  Vitals:   08/12/18 1319  BP: (!) 147/81  Pulse: 72  Resp: 18  Temp: 97.6 F (36.4 C)  SpO2: 100%     Body mass index is 36.9 kg/m.   Wt Readings from Last 3 Encounters:  08/12/18 203 lb 6.4 oz (92.3 kg)  05/27/18 198 lb 6.4 oz (90 kg)  02/25/18 208 lb 1.6 oz (94.4 kg)        ECOG FS:0 - Asymptomatic  Ocular: Sclerae unicteric, pupils round and equal Ear-nose-throat: Oropharynx clear and moist Lymphatic: No cervical or supraclavicular adenopathy Lungs no rales or rhonchi Heart regular rate and rhythm Abd soft, nontender, positive bowel sounds MSK no focal spinal tenderness, no joint edema Neuro: non-focal, well-oriented, appropriate affect Breasts: The right breast is unremarkable.  The left breast is status post recent biopsy.  There is no ecchymosis and there is no palpable mass.  There are no nipple or skin changes of concern.  Both axillae are benign.   LAB RESULTS:  CMP     Component Value Date/Time   NA 145 08/12/2018 1222   K 3.7 08/12/2018 1222   CL 107 08/12/2018 1222   CO2 28 08/12/2018 1222   GLUCOSE 114 (H) 08/12/2018 1222   BUN 19 08/12/2018 1222   CREATININE 0.89 08/12/2018 1222    CALCIUM 9.1 08/12/2018 1222   PROT 6.8 08/12/2018 1222   ALBUMIN 3.6 08/12/2018 1222   AST 14 (L) 08/12/2018 1222   ALT 14 08/12/2018 1222   ALKPHOS 66 08/12/2018 1222   BILITOT 0.2 (L) 08/12/2018 1222   GFRNONAA >60 08/12/2018 1222   GFRAA >60 08/12/2018 1222    No results found for: TOTALPROTELP, ALBUMINELP, A1GS, A2GS, BETS, BETA2SER, GAMS, MSPIKE, SPEI  No results found for: KPAFRELGTCHN, LAMBDASER, KAPLAMBRATIO  Lab Results  Component Value Date   WBC 6.0 08/12/2018   NEUTROABS 3.7 08/12/2018   HGB 13.4 08/12/2018   HCT 41.6 08/12/2018   MCV 89.8 08/12/2018   PLT 242 08/12/2018    @LASTCHEMISTRY@  No results found for: LABCA2  No components found for: LABCAN125  No results for input(s): INR in the last 168 hours.  No results found for: LABCA2  No results found for: CAN199  No results found for: CAN125  No results found for: CAN153  No results found for: CA2729  No components found for: HGQUANT  No results found for: CEA1 / No results found for: CEA1   No results found for: AFPTUMOR  No results found for: CHROMOGRNA  No results found for: PSA1  Appointment on 08/12/2018  Component Date Value Ref Range Status  . Sodium 08/12/2018 145  135 - 145 mmol/L Final  . Potassium 08/12/2018 3.7  3.5 - 5.1 mmol/L Final  . Chloride 08/12/2018 107  98 - 111 mmol/L Final  . CO2 08/12/2018 28  22 - 32 mmol/L Final  . Glucose, Bld 08/12/2018 114* 70 - 99 mg/dL Final  . BUN 08/12/2018 19  8 - 23 mg/dL Final  . Creatinine 08/12/2018 0.89  0.44 - 1.00 mg/dL Final  . Calcium 08/12/2018 9.1  8.9 - 10.3 mg/dL Final  . Total Protein 08/12/2018 6.8  6.5 - 8.1 g/dL Final  . Albumin 08/12/2018 3.6  3.5 - 5.0 g/dL Final  . AST 08/12/2018 14* 15 - 41 U/L Final  . ALT 08/12/2018 14  0 - 44 U/L Final  . Alkaline Phosphatase 08/12/2018 66  38 - 126 U/L Final  . Total Bilirubin 08/12/2018 0.2* 0.3 - 1.2 mg/dL Final  . GFR, Est Non Af Am 08/12/2018 >60  >60 mL/min Final  .  GFR, Est AFR Am 08/12/2018 >60  >60 mL/min Final   Comment: (NOTE) The eGFR has been calculated using the CKD EPI equation. This calculation has not been validated in all clinical situations. eGFR's persistently <60 mL/min signify possible Chronic Kidney Disease.   . Anion gap 08/12/2018 10  5 - 15 Final     Performed at [facility], [address] [place], [place] [address]  . WBC Count 08/12/2018 6.0  4.0 - 10.5 K/uL Final  . RBC 08/12/2018 4.63  3.87 - 5.11 MIL/uL Final  . Hemoglobin 08/12/2018 13.4  12.0 - 15.0 g/dL Final  . HCT 08/12/2018 41.6  36.0 - 46.0 % Final  . MCV 08/12/2018 89.8  80.0 - 100.0 fL Final  . MCH 08/12/2018 28.9  26.0 - 34.0 pg Final  . MCHC 08/12/2018 32.2  30.0 - 36.0 g/dL Final  . RDW 08/12/2018 12.5  11.5 - 15.5 % Final  . Platelet Count 08/12/2018 242  150 - 400 K/uL Final  . nRBC 08/12/2018 0.0  0.0 - 0.2 % Final  . Neutrophils Relative % 08/12/2018 62  % Final  . Neutro Abs 08/12/2018 3.7  1.7 - 7.7 K/uL Final  . Lymphocytes Relative 08/12/2018 27  % Final  . Lymphs Abs 08/12/2018 1.6  0.7 - 4.0 K/uL Final  . Monocytes Relative 08/12/2018 7  % Final  . Monocytes Absolute 08/12/2018 0.4  0.1 - 1.0 K/uL Final  . Eosinophils Relative 08/12/2018 3  % Final  . Eosinophils Absolute 08/12/2018 0.2  0.0 - 0.5 K/uL Final  . Basophils Relative 08/12/2018 1  % Final  . Basophils Absolute 08/12/2018 0.0  0.0 - 0.1 K/uL Final  . Immature Granulocytes 08/12/2018 0  % Final  . Abs Immature Granulocytes 08/12/2018 0.02  0.00 - 0.07 K/uL Final   Performed at [facility], [address] [place], [place] [address]    (this displays the last labs from the last 3 days)  No results found for: TOTALPROTELP, ALBUMINELP, A1GS, A2GS, BETS, BETA2SER, GAMS, MSPIKE, SPEI (this displays SPEP labs)  No results found for: KPAFRELGTCHN, LAMBDASER, KAPLAMBRATIO (kappa/lambda light chains)  No results found for: HGBA,  HGBA2QUANT, HGBFQUANT, HGBSQUAN (Hemoglobinopathy evaluation)   No results found for: LDH  No results found for: IRON, TIBC, IRONPCTSAT (Iron and TIBC)  No results found for: FERRITIN  Urinalysis    Component Value Date/Time   COLORURINE YELLOW 09/13/2011 0952   APPEARANCEUR CLEAR 09/13/2011 0952   LABSPEC 1.010 09/13/2011 0952   PHURINE 6.5 09/13/2011 0952   GLUCOSEU NEGATIVE 09/13/2011 0952   HGBUR NEGATIVE 09/13/2011 0952   HGBUR negative 04/03/2009 0949   BILIRUBINUR n 09/15/2012 0832   KETONESUR NEGATIVE 09/13/2011 0952   PROTEINUR n 09/15/2012 0832   PROTEINUR NEGATIVE 09/13/2011 0952   UROBILINOGEN 0.2 09/15/2012 0832   UROBILINOGEN 0.2 09/13/2011 0952   NITRITE n 09/15/2012 0832   NITRITE NEGATIVE 09/13/2011 0952   LEUKOCYTESUR Negative 09/15/2012 0832     STUDIES:  Us Breast Ltd Uni Left Inc Axilla  Addendum Date: 08/03/2018   ADDENDUM REPORT: 08/03/2018 11:24 ADDENDUM: No abnormal lymph nodes in the left axilla. Electronically Signed   By: David  Williams III M.D   On: 08/03/2018 11:24   Result Date: 08/03/2018 CLINICAL DATA:  Patient recalled from screening for left breast mass. EXAM: DIGITAL DIAGNOSTIC LEFT MAMMOGRAM WITH CAD AND TOMO ULTRASOUND LEFT BREAST COMPARISON:  Previous exam(s). ACR Breast Density Category c: The breast tissue is heterogeneously dense, which may obscure small masses. FINDINGS: Within the upper inner left breast middle depth there is a persistent lobular mass, further evaluated with spot compression CC and MLO tomosynthesis images. Mammographic images were processed with CAD. Targeted ultrasound is performed, showing a 7 x 8 x 8 mm lobular hypoechoic mass left breast 9:30 o'clock 4 cm from   the nipple. This is favored to represent a complicated cyst. IMPRESSION: Probable complicated cyst within the medial left breast with irregular margins. RECOMMENDATION: Given the overall appearance of the probable complicated cyst, specifically the  irregular margins, recommend diagnostic cyst aspiration for confirmation. If this does not completely aspirate, biopsy would be warranted. I have discussed the findings and recommendations with the patient. Results were also provided in writing at the conclusion of the visit. If applicable, a reminder letter will be sent to the patient regarding the next appointment. BI-RADS CATEGORY  4: Suspicious. Electronically Signed: By: Drew  Davis M.D. On: 07/28/2018 09:40   Mm Diag Breast Tomo Uni Left  Addendum Date: 08/03/2018   ADDENDUM REPORT: 08/03/2018 11:24 ADDENDUM: No abnormal lymph nodes in the left axilla. Electronically Signed   By: David  Williams III M.D   On: 08/03/2018 11:24   Result Date: 08/03/2018 CLINICAL DATA:  Patient recalled from screening for left breast mass. EXAM: DIGITAL DIAGNOSTIC LEFT MAMMOGRAM WITH CAD AND TOMO ULTRASOUND LEFT BREAST COMPARISON:  Previous exam(s). ACR Breast Density Category c: The breast tissue is heterogeneously dense, which may obscure small masses. FINDINGS: Within the upper inner left breast middle depth there is a persistent lobular mass, further evaluated with spot compression CC and MLO tomosynthesis images. Mammographic images were processed with CAD. Targeted ultrasound is performed, showing a 7 x 8 x 8 mm lobular hypoechoic mass left breast 9:30 o'clock 4 cm from the nipple. This is favored to represent a complicated cyst. IMPRESSION: Probable complicated cyst within the medial left breast with irregular margins. RECOMMENDATION: Given the overall appearance of the probable complicated cyst, specifically the irregular margins, recommend diagnostic cyst aspiration for confirmation. If this does not completely aspirate, biopsy would be warranted. I have discussed the findings and recommendations with the patient. Results were also provided in writing at the conclusion of the visit. If applicable, a reminder letter will be sent to the patient regarding the next  appointment. BI-RADS CATEGORY  4: Suspicious. Electronically Signed: By: Drew  Davis M.D. On: 07/28/2018 09:40   Mm 3d Screen Breast Bilateral  Result Date: 07/22/2018 CLINICAL DATA:  Screening. EXAM: DIGITAL SCREENING BILATERAL MAMMOGRAM WITH TOMO AND CAD COMPARISON:  Previous exam(s). ACR Breast Density Category c: The breast tissue is heterogeneously dense, which may obscure small masses. FINDINGS: In the left breast, a possible mass warrants further evaluation. In the right breast, no findings suspicious for malignancy. Images were processed with CAD. IMPRESSION: Further evaluation is suggested for possible mass in the left breast. RECOMMENDATION: Diagnostic mammogram and possibly ultrasound of the left breast. (Code:[id number]) The patient will be contacted regarding the findings, and additional imaging will be scheduled. BI-RADS CATEGORY  0: Incomplete. Need additional imaging evaluation and/or prior mammograms for comparison. Electronically Signed   By: Serena  Chacko M.D.   On: 07/22/2018 15:28   Mm Clip Placement Left  Result Date: 07/31/2018 CLINICAL DATA:  Evaluate biopsy marker EXAM: DIAGNOSTIC LEFT MAMMOGRAM POST ULTRASOUND BIOPSY COMPARISON:  Previous exam(s). FINDINGS: Mammographic images were obtained following ultrasound guided biopsy of a medial left breast mass. The mass in the medial left breast now contains a ribbon shaped biopsy marker. IMPRESSION: Appropriate biopsy marker as above. A ribbon shaped marker is located within the biopsied mass. Final Assessment: Post Procedure Mammograms for Marker Placement Electronically Signed   By: David  Williams III M.D   On: 07/31/2018 16:35   Us Lt Breast Bx W Loc Dev 1st Lesion Img Bx Spec Us Guide  Addendum   Date: 08/05/2018   ADDENDUM REPORT: 08/04/2018 12:56 ADDENDUM: Pathology revealed GRADE III INVASIVE DUCTAL CARCINOMA of the Left breast, 9:30 o'clock. This was found to be concordant by Dr. David Williams. Pathology results were  discussed with the patient by telephone. The patient reported doing well after the biopsy with tenderness at the site. Post biopsy instructions and care were reviewed and questions were answered. The patient was encouraged to call [facility] for any additional concerns. The patient was referred to [facility] at [facility] on August 12, 2018. Pathology results reported by Lynne Bailey, RN on 08/04/2018. Electronically Signed   By: David  Williams III M.D   On: 08/04/2018 12:56   Result Date: 08/05/2018 CLINICAL DATA:  Left breast mass biopsy EXAM: ULTRASOUND GUIDED LEFT BREAST CORE NEEDLE BIOPSY COMPARISON:  Previous exam(s). FINDINGS: I met with the patient and we discussed the procedure of ultrasound-guided biopsy, including benefits and alternatives. We discussed the high likelihood of a successful procedure. We discussed the risks of the procedure, including infection, bleeding, tissue injury, clip migration, and inadequate sampling. Informed written consent was given. The usual time-out protocol was performed immediately prior to the procedure. Lesion quadrant: Lower-inner Using sterile technique and 1% Lidocaine as local anesthetic, under direct ultrasound visualization, a 12 gauge spring-loaded device was used to perform biopsy of the medial left breast mass using a medial approach. At the conclusion of the procedure a ribbon shaped tissue marker clip was deployed into the biopsy cavity. Follow up 2 view mammogram was performed and dictated separately. IMPRESSION: Ultrasound guided biopsy of a left breast mass. No apparent complications. Electronically Signed: By: David  Williams III M.D On: 07/31/2018 16:37    ELIGIBLE FOR AVAILABLE RESEARCH PROTOCOL: no  ASSESSMENT: 62 y.o. [place] woman status post left breast lower inner quadrant biopsy 07/31/2018 for a clinical T1b N0, stage IA invasive ductal  carcinoma, grade 3, triple positive, with an MIB-1 of 75%.  (1) definitive surgery pending  (2) adjuvant chemotherapy will consist of paclitaxel weekly x12 with trastuzumab every 21 days  (3) trastuzumab to be continued to complete 1 year  (4) adjuvant radiation as appropriate  (5) antiestrogens to start at the completion of local treatment  PLAN: I spent approximately 60 minutes face to face with Tiffany Montes with more than 50% of that time spent in counseling and coordination of care. Specifically we reviewed the biology of the patient's diagnosis and the specifics of her situation.  We first reviewed the fact that cancer is not one disease but more than 100 different diseases and that it is important to keep them separate-- otherwise when friends and relatives discuss their own cancer experiences with Tiffany Montes confusion can result. Similarly we explained that if breast cancer spreads to the bone or liver, the patient would not have bone cancer or liver cancer, but breast cancer in the bone and breast cancer in the liver: one cancer in three places-- not 3 different cancers which otherwise would have to be treated in 3 different ways.  We discussed the difference between local and systemic therapy. In terms of loco-regional treatment, lumpectomy plus radiation is equivalent to mastectomy as far as survival is concerned. For this reason, and because the cosmetic results are generally superior, we recommend breast conserving surgery.   We then discussed the rationale for systemic therapy. There is some risk that this cancer may have already spread to other parts of her body. Patients frequently   ask at this point about bone scans, CAT scans and PET scans to find out if they have occult breast cancer somewhere else. The problem is that in early stage disease we are much more likely to find false positives then true cancers and this would expose the patient to unnecessary procedures as well as unnecessary  radiation. Scans cannot answer the question the patient really would like to know, which is whether she has microscopic disease elsewhere in her body. For those reasons we do not recommend them.  Of course we would proceed to aggressive evaluation of any symptoms that might suggest metastatic disease, but that is not the case here.  Next we went over the options for systemic therapy which are anti-estrogens, anti-HER-2 immunotherapy, and chemotherapy. Tiffany Montes meets criteria for anti-HER-2 immunotherapy and anti-estrogens.  She will also benefit from chemotherapy.  We discussed the fact that patients like her who were "triple positive" will have the best chance of cure.  The sequence in her case will be starting with surgery, with port placement, then she will receive paclitaxel weekly x12 together with trastuzumab.  The trastuzumab will be continued to complete a year.  Once the Taxol has been completed she will start radiation and then she will start antiestrogens.  [name] has a good understanding of the overall plan. She agrees with it. She knows the goal of treatment in her case is cure. She will call with any problems that may develop before her next visit here.  Mazin Emma, Virgie Dad, MD  08/16/18 11:02 AM Medical Oncology and Hematology [facility] [address] [place], [place] [address] Tel. [phone]    Fax. [phone]    I, Soijett Blue am acting as scribe for Dr. Sarajane Jews C. Calvin Jablonowski.  I, Lurline Del MD, have reviewed the above documentation for accuracy and completeness, and I agree with the above.

## 2018-08-12 ENCOUNTER — Inpatient Hospital Stay (HOSPITAL_BASED_OUTPATIENT_CLINIC_OR_DEPARTMENT_OTHER): Payer: BLUE CROSS/BLUE SHIELD | Admitting: Oncology

## 2018-08-12 ENCOUNTER — Other Ambulatory Visit: Payer: Self-pay | Admitting: *Deleted

## 2018-08-12 ENCOUNTER — Ambulatory Visit: Payer: BLUE CROSS/BLUE SHIELD | Attending: General Surgery | Admitting: Physical Therapy

## 2018-08-12 ENCOUNTER — Ambulatory Visit
Admission: RE | Admit: 2018-08-12 | Discharge: 2018-08-12 | Disposition: A | Discharge status: Home / Self Care | Payer: BLUE CROSS/BLUE SHIELD | Source: Ambulatory Visit | Attending: Radiation Oncology | Admitting: Radiation Oncology

## 2018-08-12 ENCOUNTER — Other Ambulatory Visit: Payer: Self-pay | Admitting: General Surgery

## 2018-08-12 ENCOUNTER — Encounter: Payer: Self-pay | Admitting: Oncology

## 2018-08-12 ENCOUNTER — Other Ambulatory Visit: Payer: Self-pay

## 2018-08-12 ENCOUNTER — Encounter: Payer: Self-pay | Admitting: Physical Therapy

## 2018-08-12 ENCOUNTER — Inpatient Hospital Stay: Payer: BLUE CROSS/BLUE SHIELD | Attending: Oncology

## 2018-08-12 VITALS — BP 147/81 | HR 72 | Temp 97.6°F | Resp 18 | Ht 62.25 in | Wt 203.4 lb

## 2018-08-12 DIAGNOSIS — Z79890 Hormone replacement therapy (postmenopausal): Secondary | ICD-10-CM | POA: Diagnosis not present

## 2018-08-12 DIAGNOSIS — C50212 Malignant neoplasm of upper-inner quadrant of left female breast: Secondary | ICD-10-CM | POA: Diagnosis not present

## 2018-08-12 DIAGNOSIS — C50312 Malignant neoplasm of lower-inner quadrant of left female breast: Secondary | ICD-10-CM | POA: Diagnosis not present

## 2018-08-12 DIAGNOSIS — Z90710 Acquired absence of both cervix and uterus: Secondary | ICD-10-CM | POA: Insufficient documentation

## 2018-08-12 DIAGNOSIS — Z90722 Acquired absence of ovaries, bilateral: Secondary | ICD-10-CM | POA: Diagnosis not present

## 2018-08-12 DIAGNOSIS — Z170 Estrogen receptor positive status [ER+]: Principal | ICD-10-CM

## 2018-08-12 DIAGNOSIS — I10 Essential (primary) hypertension: Secondary | ICD-10-CM | POA: Diagnosis not present

## 2018-08-12 DIAGNOSIS — R293 Abnormal posture: Secondary | ICD-10-CM

## 2018-08-12 DIAGNOSIS — Z801 Family history of malignant neoplasm of trachea, bronchus and lung: Secondary | ICD-10-CM

## 2018-08-12 LAB — CMP (CANCER CENTER ONLY)
ALT: 14 U/L (ref 0–44)
AST: 14 U/L — ABNORMAL LOW (ref 15–41)
Albumin: 3.6 g/dL (ref 3.5–5.0)
Alkaline Phosphatase: 66 U/L (ref 38–126)
Anion gap: 10 (ref 5–15)
BUN: 19 mg/dL (ref 8–23)
CALCIUM: 9.1 mg/dL (ref 8.9–10.3)
CO2: 28 mmol/L (ref 22–32)
Chloride: 107 mmol/L (ref 98–111)
Creatinine: 0.89 mg/dL (ref 0.44–1.00)
Glucose, Bld: 114 mg/dL — ABNORMAL HIGH (ref 70–99)
Potassium: 3.7 mmol/L (ref 3.5–5.1)
Sodium: 145 mmol/L (ref 135–145)
TOTAL PROTEIN: 6.8 g/dL (ref 6.5–8.1)
Total Bilirubin: 0.2 mg/dL — ABNORMAL LOW (ref 0.3–1.2)

## 2018-08-12 LAB — CBC WITH DIFFERENTIAL (CANCER CENTER ONLY)
Abs Immature Granulocytes: 0.02 10*3/uL (ref 0.00–0.07)
BASOS ABS: 0 10*3/uL (ref 0.0–0.1)
Basophils Relative: 1 %
EOS ABS: 0.2 10*3/uL (ref 0.0–0.5)
EOS PCT: 3 %
HCT: 41.6 % (ref 36.0–46.0)
Hemoglobin: 13.4 g/dL (ref 12.0–15.0)
Immature Granulocytes: 0 %
LYMPHS PCT: 27 %
Lymphs Abs: 1.6 10*3/uL (ref 0.7–4.0)
MCH: 28.9 pg (ref 26.0–34.0)
MCHC: 32.2 g/dL (ref 30.0–36.0)
MCV: 89.8 fL (ref 80.0–100.0)
MONO ABS: 0.4 10*3/uL (ref 0.1–1.0)
Monocytes Relative: 7 %
NRBC: 0 % (ref 0.0–0.2)
Neutro Abs: 3.7 10*3/uL (ref 1.7–7.7)
Neutrophils Relative %: 62 %
Platelet Count: 242 10*3/uL (ref 150–400)
RBC: 4.63 MIL/uL (ref 3.87–5.11)
RDW: 12.5 % (ref 11.5–15.5)
WBC: 6 10*3/uL (ref 4.0–10.5)

## 2018-08-12 NOTE — Patient Instructions (Signed)

## 2018-08-12 NOTE — Therapy (Signed)
[place], [name], [address] Phone: [phone]   Fax:  [phone]  Physical Therapy Evaluation  Patient Details  Name: Tiffany Montes MRN: [id number] Date of Birth: Oct 22, 1955 Referring Provider (PT): Dr. Fanny Skates   Encounter Date: 08/12/2018  PT End of Session - 08/12/18 2059    Visit Number  1    Number of Visits  2    Date for PT Re-Evaluation  10/07/18    PT Start Time  1600    PT Stop Time  1626    PT Time Calculation (min)  26 min    Activity Tolerance  Patient tolerated treatment well    Behavior During Therapy  [facility] for tasks assessed/performed       Past Medical History:  Diagnosis Date  . Anxiety   . Attention deficit disorder   . Depression   . Gallstones 09/05/2011  . Hyperlipidemia   . Hypertension   . Migraines   . PONV (postoperative nausea and vomiting)   . Thyroid disease    hypothyroid    Past Surgical History:  Procedure Laterality Date  . ABDOMINAL HYSTERECTOMY  2003  . BREAST BIOPSY Left 2014   benign  . CHOLECYSTECTOMY  09/26/2011   Procedure: LAPAROSCOPIC CHOLECYSTECTOMY WITH INTRAOPERATIVE CHOLANGIOGRAM;  Surgeon: Haywood Lasso, MD;  Location: [facility];  Service: General;  Laterality: N/A;  . CYSTIC HYGROMA EXCISION      There were no vitals filed for this visit.   Subjective Assessment - 08/12/18 2044    Subjective  Patient reports she is here today to be seen by her medical team for her newly diagnosed left breast cancer.    Patient is accompained by:  Family member    Pertinent History  Patient was diagnosed on 07/22/18 with left grade III invasive ductal carcinoma breast cancer. It measures 8 mm and is located in the upper inner quadrant. It is triple positive.    Patient Stated Goals  Reduce lymphedema risk and learn post op shoulder ROM HEP    Currently in Pain?  No/denies         [facility] PT Assessment - 08/12/18 0001      Assessment    Medical Diagnosis  Left breast cancer    Referring Provider (PT)  Dr. Fanny Skates    Onset Date/Surgical Date  07/22/18    Hand Dominance  Right    Prior Therapy  none      Precautions   Precautions  Other (comment)    Precaution Comments  active cancer      Restrictions   Weight Bearing Restrictions  No      Balance Screen   Has the patient fallen in the past 6 months  No    Has the patient had a decrease in activity level because of a fear of falling?   No    Is the patient reluctant to leave their home because of a fear of falling?   No      Home Environment   Living Environment  Private residence    Living Arrangements  Alone    Available Help at Discharge  Family      Prior Function   Level of Independence  Independent    Vocation  Full time employment    Vocation Requirements  sales at [employer]  She does not exercise      Cognition   Overall Cognitive Status  Within Functional Limits for tasks assessed      Posture/Postural Control   Posture/Postural Control  Postural limitations    Postural Limitations  Forward head;Rounded Shoulders      ROM / Strength   AROM / PROM / Strength  AROM;Strength      AROM   AROM Assessment Site  Shoulder;Cervical    Right/Left Shoulder  Right;Left    Right Shoulder Extension  52 Degrees    Right Shoulder Flexion  142 Degrees    Right Shoulder ABduction  174 Degrees    Right Shoulder Internal Rotation  53 Degrees    Right Shoulder External Rotation  88 Degrees    Left Shoulder Extension  54 Degrees    Left Shoulder Flexion  155 Degrees    Left Shoulder ABduction  171 Degrees    Left Shoulder Internal Rotation  72 Degrees    Left Shoulder External Rotation  88 Degrees    Cervical Flexion  WNL    Cervical Extension  WNL    Cervical - Right Side Bend  WNL    Cervical - Left Side Bend  WNL    Cervical - Right Rotation  WNL    Cervical - Left Rotation  WNL      Strength   Overall Strength  Within  functional limits for tasks performed        LYMPHEDEMA/ONCOLOGY QUESTIONNAIRE - 08/12/18 2056      Type   Cancer Type  Left breast cancer      Lymphedema Assessments   Lymphedema Assessments  Upper extremities      Right Upper Extremity Lymphedema   10 cm Proximal to Olecranon Process  31.6 cm    Olecranon Process  25.5 cm    10 cm Proximal to Ulnar Styloid Process  21.6 cm    Just Proximal to Ulnar Styloid Process  15.3 cm    Across Hand at [employer]  17.4 cm    At [place] of 2nd Digit  6 cm      Left Upper Extremity Lymphedema   10 cm Proximal to Olecranon Process  30.5 cm    Olecranon Process  25.8 cm    10 cm Proximal to Ulnar Styloid Process  21.5 cm    Just Proximal to Ulnar Styloid Process  15 cm    Across Hand at [employer]  17.4 cm    At [place] of 2nd Digit  5.9 cm             Objective measurements completed on examination: See above findings.       Patient was instructed today in a home exercise program today for post op shoulder range of motion. These included active assist shoulder flexion in sitting, scapular retraction, wall walking with shoulder abduction, and hands behind head external rotation.  She was encouraged to do these twice a day, holding 3 seconds and repeating 5 times when permitted by her physician.           PT Education - 08/12/18 2058    Education Details  Lymphedema risk reduction; post op shoulder ROM HEP    Person(s) Educated  Patient;Spouse    Methods  Explanation;Demonstration;Handout    Comprehension  Verbalized understanding;Returned demonstration          PT Long Term Goals - 08/12/18 2106      PT LONG TERM GOAL #1   Title  Patient will demonstrate she has regained shoulder ROM and function post operatively  compared to baseline.    Time  [facility] Goals - 08/12/18 2105      Patient will be able to verbalize understanding of pertinent lymphedema risk  reduction practices relevant to her diagnosis specifically related to skin care.   Time  1    Period  Days    Status  Achieved      Patient will be able to return demonstrate and/or verbalize understanding of the post-op home exercise program related to regaining shoulder range of motion.   Time  1    Period  Days    Status  Achieved      Patient will be able to verbalize understanding of the importance of attending the postoperative After Breast Cancer Class for further lymphedema risk reduction education and therapeutic exercise.   Time  1    Period  Days    Status  Achieved            Plan - 08/12/18 2100    Clinical Impression Statement  Patient was diagnosed on 07/22/18 with left grade III invasive ductal carcinoma breast cancer. It measures 8 mm and is located in the upper inner quadrant. It is triple positive. Her multidisciplinary medical team met prior to her assessments to determine a recommended treatment plan. She is planning to have a left lumpectomy and sentinel node biopsy followed by chemotherapy, radiation, and anti-estrogen therapy. She will benefit from a post op PT visit to reassess and determine needs.    Clinical Presentation  Stable    Clinical Decision Making  Low    Rehab Potential  Excellent    Clinical Impairments Affecting Rehab Potential  none    PT Frequency  --   Eval and 1 f/u visit   PT Treatment/Interventions  ADLs/Self Care Home Management;Therapeutic exercise;Patient/family education    PT Next Visit Plan  Will reassess 3-4 weeks post op to determine needs    PT Home Exercise Plan  Post op shoulder ROM HEP    Consulted and Agree with Plan of Care  Patient;Family member/caregiver    Family Member Consulted  Husband       Patient will benefit from skilled therapeutic intervention in order to improve the following deficits and impairments:  Postural dysfunction, Impaired UE functional use, Decreased knowledge of precautions, Decreased range of  motion  Visit Diagnosis: Malignant neoplasm of upper-inner quadrant of left breast in female, estrogen receptor positive ([place]) - Plan: PT plan of care cert/re-cert  Abnormal posture - Plan: PT plan of care cert/re-cert   Patient will follow up at outpatient cancer rehab 3-4 weeks following surgery.  If the patient requires physical therapy at that time, a specific plan will be dictated and sent to the referring physician for approval. The patient was educated today on appropriate basic range of motion exercises to begin post operatively and the importance of attending the After Breast Cancer class following surgery.  Patient was educated today on lymphedema risk reduction practices as it pertains to recommendations that will benefit the patient immediately following surgery.  She verbalized good understanding.      Problem List Patient Active Problem List   Diagnosis Date Noted  . Malignant neoplasm of upper-inner quadrant of left breast in female, estrogen receptor positive ([place]) 08/11/2018  . Prediabetes 02/24/2018  . Varicose veins of legs 06/03/2017  . Hyperglycemia 12/04/2015  . Obesity 12/04/2015  . Gallstones  09/05/2011  . WEIGHT GAIN 10/26/2010  . FATIGUE 04/12/2010  . HYPERLIPIDEMIA 01/08/2010  . SINUSITIS, ACUTE 10/16/2009  . ADD 04/14/2009  . DYSHIDROSIS 04/14/2009  . HYPOTHYROIDISM 04/03/2009  . Major depressive disorder, recurrent episode ([place]) 04/03/2009  . Essential hypertension 04/03/2009   Annia Friendly, PT 08/12/18 9:11 PM  [facility] [place], [name], [address] Phone: [phone]   Fax:  [phone]  Name: BENEDETTA SUNDSTROM MRN: [id number] Date of Birth: [birth year]

## 2018-08-12 NOTE — Progress Notes (Signed)
Nutrition Assessment  Reason for Assessment:  Pt seen in Breast Clinic  ASSESSMENT:   62 year old female with new diagnosis of breast cancer. Past medical history reviewed.    Patient reports normal appetite  Medications:  reviewed  Labs: reviewed  Anthropometrics:   Height: 62.25 inches Weight: 203 lb BMI: 36   NUTRITION DIAGNOSIS: Food and nutrition related knowledge deficit related to new diagnosis of breast cancer as evidenced by no prior need for nutrition related information.  INTERVENTION:   Discussed and provided packet of information regarding nutritional tips for breast cancer patients.  Questions answered.  Teachback method used.  Contact information provided and patient knows to contact me with questions/concerns.    MONITORING, EVALUATION, and GOAL: Pt will consume a healthy plant based diet to maintain lean body mass throughout treatment.   Kalani Baray B. Zenia Resides, [place], [place] Registered Dietitian [phone] (pager)

## 2018-08-12 NOTE — Progress Notes (Signed)
Radiation Oncology         [phone] ________________________________  Name: Tiffany Montes        MRN: [id number]  Date of Service: 08/12/2018 DOB: 1956/06/27  [id number], Alinda Sierras, MD  Fanny Skates, MD     REFERRING PHYSICIAN: Fanny Skates, MD   DIAGNOSIS: The encounter diagnosis was Malignant neoplasm of upper-inner quadrant of left breast in female, estrogen receptor positive ([place]).   HISTORY OF PRESENT ILLNESS: Tiffany Montes is a 62 y.o. female seen in the multidisciplinary breast clinic for a new diagnosis of left breast cancer. The patient was noted to have a screening detected mass in the left breast, diagnostic imaging revealed this at 9:30, and measured 8 x 8 x 7 mm.  Her axilla was negative for adenopathy.  She underwent a biopsy on 07/21/2018 which revealed a grade 3 invasive ductal carcinoma, triple positive with a Ki-67 of 75%.  She comes today to discuss options of treatment for her cancer.   PREVIOUS RADIATION THERAPY: No   PAST MEDICAL HISTORY:  Past Medical History:  Diagnosis Date  . Anxiety   . Attention deficit disorder   . Depression   . Gallstones 09/05/2011  . Hyperlipidemia   . Hypertension   . Migraines   . PONV (postoperative nausea and vomiting)   . Thyroid disease    hypothyroid       PAST SURGICAL HISTORY: Past Surgical History:  Procedure Laterality Date  . ABDOMINAL HYSTERECTOMY  2003  . BREAST BIOPSY Left 2014   benign  . CHOLECYSTECTOMY  09/26/2011   Procedure: LAPAROSCOPIC CHOLECYSTECTOMY WITH INTRAOPERATIVE CHOLANGIOGRAM;  Surgeon: Haywood Lasso, MD;  Location: [facility] OR;  Service: General;  Laterality: N/A;  . CYSTIC HYGROMA EXCISION       FAMILY HISTORY:  Family History  Problem Relation Age of Onset  . Diabetes Father   . Cancer Father        lung   . Heart disease Father      SOCIAL HISTORY:  reports that she has never smoked. She has never used smokeless tobacco. She reports that she drinks alcohol.  She reports that she does not use drugs.  She is single and lives in [place].  She works for [employer].  ALLERGIES: Morphine sulfate   MEDICATIONS:  Current Outpatient Medications  Medication Sig Dispense Refill  . amphetamine-dextroamphetamine (ADDERALL XR) 30 MG 24 hr capsule Take 1 capsule (30 mg total) by mouth daily. 30 capsule 0  . amphetamine-dextroamphetamine (ADDERALL XR) 30 MG 24 hr capsule Take 1 capsule (30 mg total) by mouth daily. May refill in one month. 30 capsule 0  . amphetamine-dextroamphetamine (ADDERALL XR) 30 MG 24 hr capsule Take 1 capsule (30 mg total) by mouth daily. 30 capsule 0  . augmented betamethasone dipropionate (DIPROLENE-AF) 0.05 % cream APPLY TO AFFECTED AREA TWICE A DAY 30 g 2  . benzonatate (TESSALON) 200 MG capsule TAKE 1 CAPSULE AS NEEDED FOR ALLERGIES(COUGH) 60 capsule 0  . buPROPion (WELLBUTRIN XL) 300 MG 24 hr tablet TAKE 1 TABLET BY MOUTH EVERY DAY 90 tablet 0  . clonazePAM (KLONOPIN) 0.5 MG tablet TAKE 1 TABLET AT BEDTIME AS NEEDED 30 tablet 5  . cyclobenzaprine (FLEXERIL) 5 MG tablet Take one to two at night as needed for upper back pain. 30 tablet 1  . estradiol (ESTRACE) 1 MG tablet Take 1 mg by mouth daily. Per [name], MD, OB/GYN    . fluticasone (FLONASE) 50 MCG/ACT nasal spray SPRAY  2 SPRAYS INTO EACH NOSTRIL EVERY DAY 16 g 2  . levothyroxine (SYNTHROID, LEVOTHROID) 125 MCG tablet TAKE 1 TABLET BY MOUTH EVERY DAY 90 tablet 1  . losartan-hydrochlorothiazide (HYZAAR) 50-12.5 MG tablet TAKE 1 TABLET BY MOUTH EVERY DAY 90 tablet 1  . Multiple Vitamins-Minerals (MULTIVITAMINS THER. W/MINERALS) TABS Take 1 tablet by mouth daily.      . sertraline (ZOLOFT) 50 MG tablet TAKE 1 TABLET BY MOUTH EVERY DAY 90 tablet 2  . SUMAtriptan (IMITREX) 100 MG tablet TAKE 1 TABLET EVERY DAY AS NEEDED FOR MIGRAINE, MAY REPEAT IN 24 HOURS 9 tablet 2   No current facility-administered medications for this encounter.      REVIEW OF SYSTEMS: On  review of systems, the patient reports that she is doing well overall. She denies any chest pain, shortness of breath, cough, fevers, chills, night sweats, unintended weight changes. She denies any bowel or bladder disturbances, and denies abdominal pain, nausea or vomiting. She denies any new musculoskeletal or joint aches or pains. A complete review of systems is obtained and is otherwise negative.     PHYSICAL EXAM:  Wt Readings from Last 3 Encounters:  05/27/18 198 lb 6.4 oz (90 kg)  02/25/18 208 lb 1.6 oz (94.4 kg)  01/28/18 208 lb 3.2 oz (94.4 kg)   Temp Readings from Last 3 Encounters:  05/27/18 98.4 F (36.9 C) (Oral)  02/25/18 98.3 F (36.8 C) (Oral)  01/28/18 98 F (36.7 C) (Oral)   BP Readings from Last 3 Encounters:  05/27/18 140/90  02/25/18 120/84  01/28/18 140/80   Pulse Readings from Last 3 Encounters:  05/27/18 76  02/25/18 75  01/28/18 67     In general this is a well appearing Caucasian female in no acute distress. She is alert and oriented x4 and appropriate throughout the examination. HEENT reveals that the patient is normocephalic, atraumatic. EOMs are intact.  Cardiopulmonary assessment is negative for acute distress and she exhibits normal effort. Breast exam is deferred.    ECOG = 0  0 - Asymptomatic (Fully active, able to carry on all predisease activities without restriction)  1 - Symptomatic but completely ambulatory (Restricted in physically strenuous activity but ambulatory and able to carry out work of a light or sedentary nature. For example, light housework, office work)  2 - Symptomatic, <50% in bed during the day (Ambulatory and capable of all self care but unable to carry out any work activities. Up and about more than 50% of waking hours)  3 - Symptomatic, >50% in bed, but not bedbound (Capable of only limited self-care, confined to bed or chair 50% or more of waking hours)  4 - Bedbound (Completely disabled. Cannot carry on any  self-care. Totally confined to bed or chair)  5 - Death   Eustace Pen MM, Creech RH, Tormey DC, et al. [phone]). "Toxicity and response criteria of the [facility]". Emily Oncol. 5 (6): 649-55    LABORATORY DATA:  Lab Results  Component Value Date   WBC 6.2 01/28/2018   HGB 13.1 01/28/2018   HCT 38.6 01/28/2018   MCV 86.1 01/28/2018   PLT 239.0 01/28/2018   Lab Results  Component Value Date   NA 138 01/28/2018   K 3.8 01/28/2018   CL 103 01/28/2018   CO2 27 01/28/2018   Lab Results  Component Value Date   ALT 12 01/28/2018   AST 14 01/28/2018   ALKPHOS 58 01/28/2018   BILITOT 0.3 01/28/2018  RADIOGRAPHY: US Breast Ltd Uni Left Inc Axilla  Addendum Date: 08/03/2018   ADDENDUM REPORT: 08/03/2018 11:24 ADDENDUM: No abnormal lymph nodes in the left axilla. Electronically Signed   By: Dorise Bullion III M.D   On: 08/03/2018 11:24   Result Date: 08/03/2018 CLINICAL DATA:  Patient recalled from screening for left breast mass. EXAM: DIGITAL DIAGNOSTIC LEFT MAMMOGRAM WITH CAD AND TOMO ULTRASOUND LEFT BREAST COMPARISON:  Previous exam(s). ACR Breast Density Category c: The breast tissue is heterogeneously dense, which may obscure small masses. FINDINGS: Within the upper inner left breast middle depth there is a persistent lobular mass, further evaluated with spot compression CC and MLO tomosynthesis images. Mammographic images were processed with CAD. Targeted ultrasound is performed, showing a 7 x 8 x 8 mm lobular hypoechoic mass left breast 9:30 o'clock 4 cm from the nipple. This is favored to represent a complicated cyst. IMPRESSION: Probable complicated cyst within the medial left breast with irregular margins. RECOMMENDATION: Given the overall appearance of the probable complicated cyst, specifically the irregular margins, recommend diagnostic cyst aspiration for confirmation. If this does not completely aspirate, biopsy would be warranted. I have discussed  the findings and recommendations with the patient. Results were also provided in writing at the conclusion of the visit. If applicable, a reminder letter will be sent to the patient regarding the next appointment. BI-RADS CATEGORY  4: Suspicious. Electronically Signed: By: Lovey Newcomer M.D. On: 07/28/2018 09:40   Mm Diag Breast Tomo Uni Left  Addendum Date: 08/03/2018   ADDENDUM REPORT: 08/03/2018 11:24 ADDENDUM: No abnormal lymph nodes in the left axilla. Electronically Signed   By: Dorise Bullion III M.D   On: 08/03/2018 11:24   Result Date: 08/03/2018 CLINICAL DATA:  Patient recalled from screening for left breast mass. EXAM: DIGITAL DIAGNOSTIC LEFT MAMMOGRAM WITH CAD AND TOMO ULTRASOUND LEFT BREAST COMPARISON:  Previous exam(s). ACR Breast Density Category c: The breast tissue is heterogeneously dense, which may obscure small masses. FINDINGS: Within the upper inner left breast middle depth there is a persistent lobular mass, further evaluated with spot compression CC and MLO tomosynthesis images. Mammographic images were processed with CAD. Targeted ultrasound is performed, showing a 7 x 8 x 8 mm lobular hypoechoic mass left breast 9:30 o'clock 4 cm from the nipple. This is favored to represent a complicated cyst. IMPRESSION: Probable complicated cyst within the medial left breast with irregular margins. RECOMMENDATION: Given the overall appearance of the probable complicated cyst, specifically the irregular margins, recommend diagnostic cyst aspiration for confirmation. If this does not completely aspirate, biopsy would be warranted. I have discussed the findings and recommendations with the patient. Results were also provided in writing at the conclusion of the visit. If applicable, a reminder letter will be sent to the patient regarding the next appointment. BI-RADS CATEGORY  4: Suspicious. Electronically Signed: By: Lovey Newcomer M.D. On: 07/28/2018 09:40   Mm 3d Screen Breast Bilateral  Result  Date: 07/22/2018 CLINICAL DATA:  Screening. EXAM: DIGITAL SCREENING BILATERAL MAMMOGRAM WITH TOMO AND CAD COMPARISON:  Previous exam(s). ACR Breast Density Category c: The breast tissue is heterogeneously dense, which may obscure small masses. FINDINGS: In the left breast, a possible mass warrants further evaluation. In the right breast, no findings suspicious for malignancy. Images were processed with CAD. IMPRESSION: Further evaluation is suggested for possible mass in the left breast. RECOMMENDATION: Diagnostic mammogram and possibly ultrasound of the left breast. (Code:[id number]) The patient will be contacted regarding the findings, and additional imaging will  be scheduled. BI-RADS CATEGORY  0: Incomplete. Need additional imaging evaluation and/or prior mammograms for comparison. Electronically Signed   By: Kristopher Oppenheim M.D.   On: 07/22/2018 15:28   Mm Clip Placement Left  Result Date: 07/31/2018 CLINICAL DATA:  Evaluate biopsy marker EXAM: DIAGNOSTIC LEFT MAMMOGRAM POST ULTRASOUND BIOPSY COMPARISON:  Previous exam(s). FINDINGS: Mammographic images were obtained following ultrasound guided biopsy of a medial left breast mass. The mass in the medial left breast now contains a ribbon shaped biopsy marker. IMPRESSION: Appropriate biopsy marker as above. A ribbon shaped marker is located within the biopsied mass. Final Assessment: Post Procedure Mammograms for Marker Placement Electronically Signed   By: Dorise Bullion III M.D   On: 07/31/2018 16:35   [place] Lt Breast Bx W Loc Dev 1st Lesion Img Bx Spec US Guide  Addendum Date: 08/05/2018   ADDENDUM REPORT: 08/04/2018 12:56 ADDENDUM: Pathology revealed GRADE III INVASIVE DUCTAL CARCINOMA of the Left breast, 9:30 o'clock. This was found to be concordant by Dr. Dorise Bullion. Pathology results were discussed with the patient by telephone. The patient reported doing well after the biopsy with tenderness at the site. Post biopsy instructions and care were  reviewed and questions were answered. The patient was encouraged to call [facility] for any additional concerns. The patient was referred to [facility] on August 12, 2018. Pathology results reported by Terie Purser, RN on 08/04/2018. Electronically Signed   By: Dorise Bullion III M.D   On: 08/04/2018 12:56   Result Date: 08/05/2018 CLINICAL DATA:  Left breast mass biopsy EXAM: ULTRASOUND GUIDED LEFT BREAST CORE NEEDLE BIOPSY COMPARISON:  Previous exam(s). FINDINGS: I met with the patient and we discussed the procedure of ultrasound-guided biopsy, including benefits and alternatives. We discussed the high likelihood of a successful procedure. We discussed the risks of the procedure, including infection, bleeding, tissue injury, clip migration, and inadequate sampling. Informed written consent was given. The usual time-out protocol was performed immediately prior to the procedure. Lesion quadrant: Lower-inner Using sterile technique and 1% Lidocaine as local anesthetic, under direct ultrasound visualization, a 12 gauge spring-loaded device was used to perform biopsy of the medial left breast mass using a medial approach. At the conclusion of the procedure a ribbon shaped tissue marker clip was deployed into the biopsy cavity. Follow up 2 view mammogram was performed and dictated separately. IMPRESSION: Ultrasound guided biopsy of a left breast mass. No apparent complications. Electronically Signed: By: Dorise Bullion III M.D On: 07/31/2018 16:37       IMPRESSION/PLAN: 1. 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 Lisbeth Renshaw discusses the pathology findings and reviews the nature of invasive left breast disease. The consensus from the breast conference includes breast conservation with lumpectomy with sentinel node biopsy.  It would be recommended that she  receive systemic therapy including HER-2 blockade.  Following chemotherapy, she would also benefit from adjuvant external radiotherapy to the breast followed by antiestrogen therapy. We discussed the risks, benefits, short, and long term effects of radiotherapy, and the patient is interested in proceeding. Dr. Lisbeth Renshaw discusses the delivery and logistics of radiotherapy and anticipates a course of 4 or 6 1/2 weeks of radiotherapy with deep inspiration breath hold technique. We will see her back about 2 to 3 weeks after completing chemotherapy to discuss beginning adjuvant radiotherapy. 2. HRT. The patient has stopped taking estrogen replacement therapy following her  diagnosis and is counseled on the rationale to abstain. She will review antiestrogen therapy as well with Dr. Jana Hakim.  In a visit lasting 60 minutes, greater than 50% of the time was spent face to face discussing her case, and coordinating the patient's care.   The above documentation reflects my direct findings during this shared patient visit. Please see the separate note by Dr. Lisbeth Renshaw on this date for the remainder of the patient's plan of care.    Carola Rhine, PAC

## 2018-08-13 ENCOUNTER — Telehealth: Payer: Self-pay | Admitting: Oncology

## 2018-08-13 ENCOUNTER — Other Ambulatory Visit: Payer: Self-pay | Admitting: Family Medicine

## 2018-08-13 NOTE — Telephone Encounter (Signed)
Scheduled per 11/6 los.

## 2018-08-14 NOTE — Progress Notes (Signed)
[facility] Psychosocial Distress Screening Spiritual Care  Met with Tiffany Montes in Breast Multidisciplinary Clinic to introduce Support Center team/resources, reviewing distress screen per protocol.  The patient scored a 9 on the Psychosocial Distress Thermometer which indicates severe distress. Also assessed for distress and other psychosocial needs.   ONCBCN DISTRESS SCREENING 08/14/2018  Screening Type Initial Screening  Distress experienced in past week (1-10) 9  Practical problem type Work/school  Family Problem type Children  Emotional problem type Depression;Nervousness/Anxiety;Adjusting to illness  Information Concerns Type Lack of info about diagnosis;Lack of info about treatment  Referral to support programs Yes   The patient expressed that she has felt some relief upon receiving additional information about her diagnosis and treatment. The patient reported that her distress score has decreased to a 4, indicating moderate distress. The patient shared that the unknowns weighed most heavily on her. The patient expressed interest in massage services through the [facility], and the patient reported that she will reach out if any additional needs arise.  Follow up needed: No.   , Counseling Intern [phone]      

## 2018-08-15 ENCOUNTER — Other Ambulatory Visit: Payer: Self-pay | Admitting: Family Medicine

## 2018-08-16 MED ORDER — LIDOCAINE-PRILOCAINE 2.5-2.5 % EX CREA: TOPICAL_CREAM | CUTANEOUS | 3 refills | Status: DC

## 2018-08-16 MED ORDER — PROCHLORPERAZINE MALEATE 10 MG PO TABS: 10.0000 mg | ORAL_TABLET | Freq: Four times a day (QID) | ORAL | 1 refills | Status: DC | PRN

## 2018-08-16 NOTE — Addendum Note (Signed)
Addended by: Chauncey Cruel on: 08/16/2018 11:58 AM   Modules accepted: Orders

## 2018-08-16 NOTE — Progress Notes (Signed)
START ON PATHWAY REGIMEN - Breast   Paclitaxel Weekly + Trastuzumab Weekly:   Administer weekly:     Paclitaxel      Trastuzumab-xxxx      Trastuzumab-xxxx   **Always confirm dose/schedule in your pharmacy ordering system**  Trastuzumab (Maintenance - NO Loading Dose):   A cycle is every 21 days:     Trastuzumab-xxxx   **Always confirm dose/schedule in your pharmacy ordering system**  Patient Characteristics: Postoperative without Neoadjuvant Therapy (Pathologic Staging), Invasive Disease, Adjuvant Therapy, HER2 Positive, ER Positive, Node Negative, pT1b, pN0/N75m, Chemotherapy Indicated Therapeutic Status: Postoperative without Neoadjuvant Therapy (Pathologic Staging) AJCC Grade: G3 AJCC N Category: pN0 AJCC M Category: cM0 ER Status: Positive (+) AJCC 8 Stage Grouping: IA HER2 Status: Positive (+) Oncotype Dx Recurrence Score: Not Appropriate AJCC T Category: pT1b PR Status: Positive (+) Intervention Indicated: Chemotherapy Intent of Therapy: Curative Intent, Discussed with Patient

## 2018-08-17 ENCOUNTER — Ambulatory Visit (HOSPITAL_COMMUNITY)
Admission: RE | Admit: 2018-08-17 | Discharge: 2018-08-17 | Disposition: A | Discharge status: Home / Self Care | Payer: BLUE CROSS/BLUE SHIELD | Source: Ambulatory Visit | Attending: Oncology | Admitting: Oncology

## 2018-08-17 DIAGNOSIS — C50212 Malignant neoplasm of upper-inner quadrant of left female breast: Secondary | ICD-10-CM | POA: Insufficient documentation

## 2018-08-17 DIAGNOSIS — Z170 Estrogen receptor positive status [ER+]: Secondary | ICD-10-CM | POA: Diagnosis not present

## 2018-08-17 NOTE — Progress Notes (Signed)
  Echocardiogram 2D Echocardiogram has been performed.  Jennette Dubin 08/17/2018, 10:42 AM

## 2018-08-19 ENCOUNTER — Telehealth: Payer: Self-pay | Admitting: Family Medicine

## 2018-08-19 ENCOUNTER — Other Ambulatory Visit: Payer: Self-pay | Admitting: Family Medicine

## 2018-08-19 NOTE — Telephone Encounter (Signed)
Last OV 05/27/18, No future OV  Last filled 05/2018, # 30 with 0 refills

## 2018-08-19 NOTE — Telephone Encounter (Signed)
Last OV 05/27/18, No future OV  Last filled 02/05/18, # 30 with 5 refills

## 2018-08-19 NOTE — Telephone Encounter (Signed)
Copied from [place] [phone]. Topic: General - Other >> Aug 19, 2018  3:25 PM Lennox Solders wrote: Reason for CRM: pt is calling and needs a refill on generic adderall xr 30 mg. [facility] [address]

## 2018-08-20 ENCOUNTER — Telehealth: Payer: Self-pay | Admitting: *Deleted

## 2018-08-20 MED ORDER — AMPHETAMINE-DEXTROAMPHET ER 30 MG PO CP24: 30.0000 mg | ORAL_CAPSULE | Freq: Every day | ORAL | 0 refills | Status: DC

## 2018-08-20 NOTE — Telephone Encounter (Signed)
Refill of Adderall XR sent.

## 2018-08-20 NOTE — Telephone Encounter (Signed)
Left vm regarding [facility] from 11.6.19. Contact information provided for questions or needs.

## 2018-08-21 ENCOUNTER — Other Ambulatory Visit: Payer: Self-pay | Admitting: General Surgery

## 2018-08-21 DIAGNOSIS — C50212 Malignant neoplasm of upper-inner quadrant of left female breast: Secondary | ICD-10-CM

## 2018-08-21 DIAGNOSIS — Z170 Estrogen receptor positive status [ER+]: Principal | ICD-10-CM

## 2018-08-25 ENCOUNTER — Telehealth: Payer: Self-pay | Admitting: Oncology

## 2018-08-25 NOTE — Telephone Encounter (Signed)
Scheduled appt per 11/15 sch message - pt is aware of changes.

## 2018-08-28 NOTE — Pre-Procedure Instructions (Signed)
Tiffany Montes  08/28/2018      [id number] - [place], [place] [address] [place] [address] Phone: [phone] Fax: [phone]    Your procedure is scheduled on December 2nd.  Report to [facility] [place] [facility]mitting at 1100 A.M.  Call this number if you have problems the morning of surgery:  [phone]   Remember:  Do not eat after midnight.  You may drink clear liquids until 1000am .  Clear liquids allowed are:                    Water, Juice (non-citric and without pulp), Carbonated beverages, Clear Tea, Black Coffee only and Gatorade    Take these medicines the morning of surgery with A SIP OF WATER   buPROPion (WELLBUTRIN XL)  estradiol (ESTRACE)  fluticasone (FLONASE)  levothyroxine (SYNTHROID, LEVOTHROID)  prochlorperazine (COMPAZINE)  If needed   7 days prior to surgery STOP taking any Aspirin(unless otherwise instructed by your surgeon), Aleve, Naproxen, Ibuprofen, Motrin, Advil, Goody's, BC's, all herbal medications, fish oil, and all vitamins     Do not wear jewelry, make-up or nail polish.  Do not wear lotions, powders, or perfumes, or deodorant.  Do not shave 48 hours prior to surgery.  Men may shave face and neck.  Do not bring valuables to the hospital.  [facility] is not responsible for any belongings or valuables.  Contacts, dentures or bridgework may not be worn into surgery.  Leave your suitcase in the car.  After surgery it may be brought to your room.  For patients admitted to the hospital, discharge time will be determined by your treatment team.  Patients discharged the day of surgery will not be allowed to drive home.    [place]- Preparing For Surgery  Before surgery, you can play an important role. Because skin is not sterile, your skin needs to be as free of germs as possible. You can reduce the number of germs on your skin by washing with CHG  (chlorahexidine gluconate) Soap before surgery.  CHG is an antiseptic cleaner which kills germs and bonds with the skin to continue killing germs even after washing.    Oral Hygiene is also important to reduce your risk of infection.  Remember - BRUSH YOUR TEETH THE MORNING OF SURGERY WITH YOUR REGULAR TOOTHPASTE  Please do not use if you have an allergy to CHG or antibacterial soaps. If your skin becomes reddened/irritated stop using the CHG.  Do not shave (including legs and underarms) for at least 48 hours prior to first CHG shower. It is OK to shave your face.  Please follow these instructions carefully.   1. Shower the NIGHT BEFORE SURGERY and the MORNING OF SURGERY with CHG.   2. If you chose to wash your hair, wash your hair first as usual with your normal shampoo.  3. After you shampoo, rinse your hair and body thoroughly to remove the shampoo.  4. Use CHG as you would any other liquid soap. You can apply CHG directly to the skin and wash gently with a scrungie or a clean washcloth.   5. Apply the CHG Soap to your body ONLY FROM THE NECK DOWN.  Do not use on open wounds or open sores. Avoid contact with your eyes, ears, mouth and genitals (private parts). Wash Face and genitals (private parts)  with your normal soap.  6. Wash thoroughly, paying special  attention to the area where your surgery will be performed.  7. Thoroughly rinse your body with warm water from the neck down.  8. DO NOT shower/wash with your normal soap after using and rinsing off the CHG Soap.  9. Pat yourself dry with a CLEAN TOWEL.  10. Wear CLEAN PAJAMAS to bed the night before surgery, wear comfortable clothes the morning of surgery  11. Place CLEAN SHEETS on your bed the night of your first shower and DO NOT SLEEP WITH PETS.    Day of Surgery:  Do not apply any deodorants/lotions.  Please wear clean clothes to the hospital/surgery center.   Remember to brush your teeth WITH YOUR REGULAR  TOOTHPASTE.    Please read over the following fact sheets that you were given.

## 2018-08-31 ENCOUNTER — Encounter (HOSPITAL_COMMUNITY): Payer: Self-pay

## 2018-08-31 ENCOUNTER — Encounter (HOSPITAL_COMMUNITY)
Admission: RE | Admit: 2018-08-31 | Discharge: 2018-08-31 | Disposition: A | Discharge status: Home / Self Care | Payer: BLUE CROSS/BLUE SHIELD | Source: Ambulatory Visit | Attending: General Surgery | Admitting: General Surgery

## 2018-08-31 ENCOUNTER — Other Ambulatory Visit: Payer: Self-pay

## 2018-08-31 DIAGNOSIS — I10 Essential (primary) hypertension: Secondary | ICD-10-CM | POA: Diagnosis not present

## 2018-08-31 DIAGNOSIS — Z01818 Encounter for other preprocedural examination: Secondary | ICD-10-CM | POA: Insufficient documentation

## 2018-08-31 HISTORY — DX: Hypothyroidism, unspecified: E03.9

## 2018-08-31 LAB — COMPREHENSIVE METABOLIC PANEL
ALBUMIN: 3.9 g/dL (ref 3.5–5.0)
ALK PHOS: 60 U/L (ref 38–126)
ALT: 20 U/L (ref 0–44)
ANION GAP: 9 (ref 5–15)
AST: 24 U/L (ref 15–41)
BUN: 14 mg/dL (ref 8–23)
CALCIUM: 9.3 mg/dL (ref 8.9–10.3)
CHLORIDE: 109 mmol/L (ref 98–111)
CO2: 22 mmol/L (ref 22–32)
Creatinine, Ser: 0.78 mg/dL (ref 0.44–1.00)
GFR calc Af Amer: >60 mL/min (ref >60)
GFR calc non Af Amer: >60 mL/min (ref >60)
GLUCOSE: 121 mg/dL — AB (ref 70–99)
Potassium: 3.7 mmol/L (ref 3.5–5.1)
Sodium: 140 mmol/L (ref 135–145)
TOTAL PROTEIN: 6.8 g/dL (ref 6.5–8.1)
Total Bilirubin: 0.8 mg/dL (ref 0.3–1.2)

## 2018-08-31 LAB — CBC
HCT: 45 % (ref 36.0–46.0)
HEMOGLOBIN: 14.3 g/dL (ref 12.0–15.0)
MCH: 28.5 pg (ref 26.0–34.0)
MCHC: 31.8 g/dL (ref 30.0–36.0)
MCV: 89.6 fL (ref 80.0–100.0)
Platelets: 218 10*3/uL (ref 150–400)
RBC: 5.02 MIL/uL (ref 3.87–5.11)
RDW: 12.1 % (ref 11.5–15.5)
WBC: 6.4 10*3/uL (ref 4.0–10.5)
nRBC: 0 % (ref 0.0–0.2)

## 2018-08-31 NOTE — Progress Notes (Addendum)
PCP - Carolann Littler MD  Chest x-ray - N/A EKG - 08/31/18 ECHO - 2019   Blood Thinner Instructions: N/A Aspirin Instructions: N/A  Anesthesia review: EKG review  Patient denies shortness of breath, fever, cough and chest pain at PAT appointment   Patient verbalized understanding of instructions that were given to them at the PAT appointment. Patient was also instructed that they will need to review over the PAT instructions again at home before surgery.

## 2018-09-05 NOTE — H&P (Signed)
Tiffany Montes Location: [facility] Patient #: [id number] DOB: May 11, 1956 Undefined / Language: Cleophus Montes / Race: White Female       History of Present Illness       This is a pleasant, 62 year old female with good insight. She was referred by Dr. Dorise Bullion at the [facility] for a cancer of the left breast upper inner quadrant. She is seen in the [facility] today by Dr. Jana Hakim, Dr. Lisbeth Renshaw, and me. Carolann Littler is her PCP. [name] is her gynecologist. Her daughter is present with her throughout the encounter today.      She had a left breast biopsy several years ago for benign disease. Otherwise no breast problems. Recent screening mammogram show an 8 mm mass in the left breast, 9:30 position, 4 cm from the nipple. Axillary ultrasound negative. 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 She was discussed at breast conference this morning. Recommendation was to proceed with definitive breast surgery, Port-A-Cath insertion, and adjuvant Herceptin-based chemotherapy. She basically agrees with all this     Comorbidities include hypertension. Cholecystectomy by Dr. Margot Chimes. Attention deficit disorder well-controlled. TAH and BSO through Pfannenstiel incision. Family history reveals mother is living at age 73. Father's deceased had lung cancer but survived that and died of congestive heart failure and diabetes. There is no family history of breast, ovarian, or prostate cancer Social history reveals she is divorced. Her daughter is with her today throughout the encounter. She is single. Drinks alcohol rarely. Denies tobacco. Has 2 children. 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      At a long discussion with her about breast cancer management in general and specifically breast management. We discussed the options of lumpectomy sentinel node biopsy and compared to mastectomy with or without  reconstruction. She desires breast conservation think she is a good candidate for that.       She quit her estrogens 2 days ago and knows not to restart that. She knows that she may be offered anti-estrogens at a later date. She will contact her gynecologist if she has problems with hot flashes and withdrawal symptoms      She will be scheduled for a left breast lumpectomy with radioactive seed localization, left axillary deep Sentinel lymph node biopsy, and Port-A-Cath insertion with ultrasound. I discussed the indications, details, techniques, and numerous risk of the surgery with her and her daughter. They're aware of the risk of bleeding, infection, cosmetic deformity, reoperation for positive margins were positive nodes, nerve damage with chronic pain, bilateral TMs on the Port-A-Cath, Port-A-Cath malfunction requiring revision, pneumothorax, cardiac arrhythmia. She understands all these issues. All of her questions are answered. She agrees with this plan.    Medication History  Medications Reconciled    Physical Exam  General Mental Status-Alert. General Appearance-Consistent with stated age. Hydration-Well hydrated. Voice-Normal.  Head and Neck Head-normocephalic, atraumatic with no lesions or palpable masses. Trachea-midline. Thyroid Gland Characteristics - normal size and consistency.  Eye Eyeball - Bilateral-Extraocular movements intact. Sclera/Conjunctiva - Bilateral-No scleral icterus.  Chest and Lung Exam Chest and lung exam reveals -quiet, even and easy respiratory effort with no use of accessory muscles and on auscultation, normal breath sounds, no adventitious sounds and normal vocal resonance. Inspection Chest Wall - Normal. Back - normal.  Breast Note: Breasts are moderately large. Soft. Biopsy site and tiny bruise left breast medially. There may be a tiny hematoma but I'm not sure. There is  no other mass or skin changes in either  breast. There is no palpable axillary adenopathy.   Cardiovascular Cardiovascular examination reveals -normal heart sounds, regular rate and rhythm with no murmurs and normal pedal pulses bilaterally.  Abdomen Inspection Inspection of the abdomen reveals - No Hernias. Skin - Scar - Note: Healed Pfannenstiel incision. Healed trocar sites from cholecystectomy. Palpation/Percussion Palpation and Percussion of the abdomen reveal - Soft, Non Tender, No Rebound tenderness, No Rigidity (guarding) and No hepatosplenomegaly. Auscultation Auscultation of the abdomen reveals - Bowel sounds normal.  Neurologic Neurologic evaluation reveals -alert and oriented x 3 with no impairment of recent or remote memory. Mental Status-Normal.  Musculoskeletal Normal Exam - Left-Upper Extremity Strength Normal and Lower Extremity Strength Normal. Normal Exam - Right-Upper Extremity Strength Normal and Lower Extremity Strength Normal.  Lymphatic Head & Neck  General Head & Neck Lymphatics: Bilateral - Description - Normal. Axillary  General Axillary Region: Bilateral - Description - Normal. Tenderness - Non Tender. Femoral & Inguinal  Generalized Femoral & Inguinal Lymphatics: Bilateral - Description - Normal. Tenderness - Non Tender.    Assessment & Plan  PRIMARY CANCER OF UPPER INNER QUADRANT OF LEFT FEMALE BREAST (C50.212)   your recent screening mammogram show an 8 mm mass in the left breast upper inner quadrant Your lymph nodes looked normal by ultrasound Image guided biopsy shows high-grade invasive ductal carcinoma. This tumor is positive for estrogen and progesterone. It is also positive for Herceptin. This is a high-grade tumor You have advised to have surgery, Port-A-Cath insertion, and postop chemotherapy You have agreed with this  We discussed options for surgery including lumpectomy, sentinel lymph node biopsy, radiation therapy and compared that to mastectomy.  You are  interested in breast conservation and I think you are a good candidate for that You will be scheduled for Port-A-Cath insertion, left breast lumpectomy with radioactive seed localization, left axillary deep sentinel lymph node biopsy Dr. Dalbert Batman has discussed the indications, techniques, and risks of the surgery in detail  Dr. Darrel Hoover office will call you tomorrow to begin the scheduling process  HYPERTENSION, ESSENTIAL (I10) HISTORY OF TOTAL ABDOMINAL HYSTERECTOMY AND BILATERAL SALPINGO-OOPHORECTOMY (Z90.710) HISTORY OF LAPAROSCOPIC CHOLECYSTECTOMY (Z90.49) ATTENTION DEFICIT DISORDER (ADD) IN ADULT (F98.8)    Jery Hollern M. Dalbert Batman, M.D., [facility] General and Minimally invasive Surgery Breast and Colorectal Surgery Office:   [phone] Pager:   [phone]

## 2018-09-07 ENCOUNTER — Other Ambulatory Visit: Payer: Self-pay

## 2018-09-07 ENCOUNTER — Ambulatory Visit
Admission: RE | Admit: 2018-09-07 | Discharge: 2018-09-07 | Disposition: A | Discharge status: Home / Self Care | Payer: BLUE CROSS/BLUE SHIELD | Source: Ambulatory Visit | Attending: General Surgery | Admitting: General Surgery

## 2018-09-07 ENCOUNTER — Ambulatory Visit (HOSPITAL_COMMUNITY): Payer: BLUE CROSS/BLUE SHIELD | Admitting: Physician Assistant

## 2018-09-07 ENCOUNTER — Ambulatory Visit (HOSPITAL_COMMUNITY): Payer: BLUE CROSS/BLUE SHIELD

## 2018-09-07 ENCOUNTER — Inpatient Hospital Stay (HOSPITAL_COMMUNITY): Payer: BLUE CROSS/BLUE SHIELD

## 2018-09-07 ENCOUNTER — Encounter (HOSPITAL_COMMUNITY)
Admission: RE | Disposition: A | Discharge status: Home / Self Care | Payer: Self-pay | Source: Ambulatory Visit | Attending: General Surgery

## 2018-09-07 ENCOUNTER — Ambulatory Visit (HOSPITAL_COMMUNITY)
Admission: RE | Admit: 2018-09-07 | Discharge: 2018-09-07 | Disposition: A | Discharge status: Home / Self Care | Payer: BLUE CROSS/BLUE SHIELD | Source: Ambulatory Visit | Attending: General Surgery | Admitting: General Surgery

## 2018-09-07 ENCOUNTER — Ambulatory Visit (HOSPITAL_COMMUNITY): Payer: BLUE CROSS/BLUE SHIELD | Admitting: Registered Nurse

## 2018-09-07 ENCOUNTER — Encounter (HOSPITAL_COMMUNITY): Payer: Self-pay

## 2018-09-07 DIAGNOSIS — Z79899 Other long term (current) drug therapy: Secondary | ICD-10-CM | POA: Insufficient documentation

## 2018-09-07 DIAGNOSIS — Z79890 Hormone replacement therapy (postmenopausal): Secondary | ICD-10-CM | POA: Diagnosis not present

## 2018-09-07 DIAGNOSIS — E039 Hypothyroidism, unspecified: Secondary | ICD-10-CM | POA: Insufficient documentation

## 2018-09-07 DIAGNOSIS — Z170 Estrogen receptor positive status [ER+]: Principal | ICD-10-CM

## 2018-09-07 DIAGNOSIS — F418 Other specified anxiety disorders: Secondary | ICD-10-CM | POA: Diagnosis not present

## 2018-09-07 DIAGNOSIS — Z4682 Encounter for fitting and adjustment of non-vascular catheter: Secondary | ICD-10-CM | POA: Diagnosis not present

## 2018-09-07 DIAGNOSIS — C50212 Malignant neoplasm of upper-inner quadrant of left female breast: Secondary | ICD-10-CM

## 2018-09-07 DIAGNOSIS — I10 Essential (primary) hypertension: Secondary | ICD-10-CM | POA: Insufficient documentation

## 2018-09-07 DIAGNOSIS — Z95828 Presence of other vascular implants and grafts: Secondary | ICD-10-CM

## 2018-09-07 DIAGNOSIS — G8918 Other acute postprocedural pain: Secondary | ICD-10-CM | POA: Diagnosis not present

## 2018-09-07 DIAGNOSIS — C50912 Malignant neoplasm of unspecified site of left female breast: Secondary | ICD-10-CM | POA: Diagnosis not present

## 2018-09-07 DIAGNOSIS — Z419 Encounter for procedure for purposes other than remedying health state, unspecified: Secondary | ICD-10-CM

## 2018-09-07 HISTORY — PX: BREAST LUMPECTOMY WITH RADIOACTIVE SEED AND SENTINEL LYMPH NODE BIOPSY: SHX6550

## 2018-09-07 HISTORY — PX: PORTACATH PLACEMENT: SHX2246

## 2018-09-07 HISTORY — PX: BREAST LUMPECTOMY: SHX2

## 2018-09-07 SURGERY — BREAST LUMPECTOMY WITH RADIOACTIVE SEED AND SENTINEL LYMPH NODE BIOPSY
Anesthesia: General | Site: Chest

## 2018-09-07 MED ORDER — 0.9 % SODIUM CHLORIDE (POUR BTL) OPTIME
TOPICAL | Status: DC | PRN
  Administered 2018-09-07: 1000 mL

## 2018-09-07 MED ORDER — SODIUM CHLORIDE 0.9% FLUSH: 3.0000 mL | Freq: Two times a day (BID) | INTRAVENOUS | Status: DC

## 2018-09-07 MED ORDER — METHYLENE BLUE 0.5 % INJ SOLN
INTRAVENOUS | Status: AC
  Filled 2018-09-07: qty 10

## 2018-09-07 MED ORDER — IOPAMIDOL (ISOVUE-300) INJECTION 61%
INTRAVENOUS | Status: AC
  Filled 2018-09-07: qty 50

## 2018-09-07 MED ORDER — SODIUM CHLORIDE 0.9% FLUSH: 3.0000 mL | INTRAVENOUS | Status: DC | PRN

## 2018-09-07 MED ORDER — BUPIVACAINE-EPINEPHRINE (PF) 0.25% -1:200000 IJ SOLN
INTRAMUSCULAR | Status: AC
  Filled 2018-09-07: qty 30

## 2018-09-07 MED ORDER — GABAPENTIN 300 MG PO CAPS
300.0000 mg | ORAL_CAPSULE | ORAL | Status: AC
  Administered 2018-09-07: 300 mg via ORAL
  Filled 2018-09-07: qty 1

## 2018-09-07 MED ORDER — CEFAZOLIN SODIUM-DEXTROSE 2-4 GM/100ML-% IV SOLN
2.0000 g | INTRAVENOUS | Status: AC
  Administered 2018-09-07: 2 g via INTRAVENOUS
  Filled 2018-09-07: qty 100

## 2018-09-07 MED ORDER — BUPIVACAINE-EPINEPHRINE 0.25% -1:200000 IJ SOLN
INTRAMUSCULAR | Status: DC | PRN
  Administered 2018-09-07: 28 mL

## 2018-09-07 MED ORDER — ACETAMINOPHEN 500 MG PO TABS: 1000.0000 mg | ORAL_TABLET | Freq: Four times a day (QID) | ORAL | Status: DC

## 2018-09-07 MED ORDER — CHLORHEXIDINE GLUCONATE CLOTH 2 % EX PADS: 6.0000 | MEDICATED_PAD | Freq: Once | CUTANEOUS | Status: DC

## 2018-09-07 MED ORDER — EPHEDRINE SULFATE 50 MG/ML IJ SOLN
INTRAMUSCULAR | Status: DC | PRN
  Administered 2018-09-07 (×5): 10 mg via INTRAVENOUS

## 2018-09-07 MED ORDER — ACETAMINOPHEN 500 MG PO TABS
1000.0000 mg | ORAL_TABLET | ORAL | Status: AC
  Administered 2018-09-07: 1000 mg via ORAL
  Filled 2018-09-07: qty 2

## 2018-09-07 MED ORDER — MEPERIDINE HCL 50 MG/ML IJ SOLN: 6.2500 mg | INTRAMUSCULAR | Status: DC | PRN

## 2018-09-07 MED ORDER — ACETAMINOPHEN 160 MG/5ML PO SOLN: 325.0000 mg | ORAL | Status: DC | PRN

## 2018-09-07 MED ORDER — MIDAZOLAM HCL 5 MG/5ML IJ SOLN
INTRAMUSCULAR | Status: DC | PRN
  Administered 2018-09-07: 2 mg via INTRAVENOUS

## 2018-09-07 MED ORDER — FENTANYL CITRATE (PF) 250 MCG/5ML IJ SOLN
INTRAMUSCULAR | Status: AC
  Filled 2018-09-07: qty 5

## 2018-09-07 MED ORDER — FENTANYL CITRATE (PF) 100 MCG/2ML IJ SOLN: 25.0000 ug | INTRAMUSCULAR | Status: DC | PRN

## 2018-09-07 MED ORDER — ACETAMINOPHEN 650 MG RE SUPP: 650.0000 mg | RECTAL | Status: DC | PRN

## 2018-09-07 MED ORDER — KETOROLAC TROMETHAMINE 30 MG/ML IJ SOLN: 30.0000 mg | Freq: Once | INTRAMUSCULAR | Status: DC | PRN

## 2018-09-07 MED ORDER — SODIUM CHLORIDE 0.9 % IV SOLN
INTRAVENOUS | Status: DC | PRN
  Administered 2018-09-07: 15 ug/min via INTRAVENOUS

## 2018-09-07 MED ORDER — FENTANYL CITRATE (PF) 250 MCG/5ML IJ SOLN
INTRAMUSCULAR | Status: DC | PRN
  Administered 2018-09-07 (×3): 25 ug via INTRAVENOUS
  Administered 2018-09-07: 100 ug via INTRAVENOUS
  Administered 2018-09-07 (×4): 25 ug via INTRAVENOUS

## 2018-09-07 MED ORDER — ACETAMINOPHEN 325 MG PO TABS: 325.0000 mg | ORAL_TABLET | ORAL | Status: DC | PRN

## 2018-09-07 MED ORDER — SODIUM CHLORIDE FLUSH 0.9 % IV SOLN
INTRAVENOUS | Status: AC
  Filled 2018-09-07: qty 10

## 2018-09-07 MED ORDER — SODIUM CHLORIDE 0.9 % IV SOLN
INTRAVENOUS | Status: AC
  Filled 2018-09-07: qty 1.2

## 2018-09-07 MED ORDER — FENTANYL CITRATE (PF) 100 MCG/2ML IJ SOLN
100.0000 ug | Freq: Once | INTRAMUSCULAR | Status: AC
  Administered 2018-09-07: 100 ug via INTRAVENOUS
  Filled 2018-09-07: qty 2

## 2018-09-07 MED ORDER — PROPOFOL 10 MG/ML IV BOLUS
INTRAVENOUS | Status: AC
  Filled 2018-09-07: qty 20

## 2018-09-07 MED ORDER — ONDANSETRON HCL 4 MG/2ML IJ SOLN
INTRAMUSCULAR | Status: DC | PRN
  Administered 2018-09-07: 4 mg via INTRAVENOUS

## 2018-09-07 MED ORDER — MIDAZOLAM HCL 2 MG/2ML IJ SOLN
2.0000 mg | Freq: Once | INTRAMUSCULAR | Status: AC
  Administered 2018-09-07: 2 mg via INTRAVENOUS
  Filled 2018-09-07: qty 2

## 2018-09-07 MED ORDER — ONDANSETRON HCL 4 MG/2ML IJ SOLN: 4.0000 mg | Freq: Once | INTRAMUSCULAR | Status: DC | PRN

## 2018-09-07 MED ORDER — OXYCODONE HCL 5 MG PO TABS: 5.0000 mg | ORAL_TABLET | ORAL | Status: DC | PRN

## 2018-09-07 MED ORDER — MIDAZOLAM HCL 2 MG/2ML IJ SOLN
INTRAMUSCULAR | Status: AC
  Filled 2018-09-07: qty 2

## 2018-09-07 MED ORDER — SODIUM CHLORIDE 0.9 % IV SOLN
INTRAVENOUS | Status: DC | PRN
  Administered 2018-09-07: 14:00:00

## 2018-09-07 MED ORDER — ACETAMINOPHEN 325 MG PO TABS: 650.0000 mg | ORAL_TABLET | ORAL | Status: DC | PRN

## 2018-09-07 MED ORDER — HEPARIN SOD (PORK) LOCK FLUSH 100 UNIT/ML IV SOLN
INTRAVENOUS | Status: AC
  Filled 2018-09-07: qty 5

## 2018-09-07 MED ORDER — LIDOCAINE-EPINEPHRINE (PF) 1 %-1:200000 IJ SOLN
INTRAMUSCULAR | Status: AC
  Filled 2018-09-07: qty 30

## 2018-09-07 MED ORDER — TECHNETIUM TC 99M SULFUR COLLOID FILTERED
1.0000 | Freq: Once | INTRAVENOUS | Status: AC | PRN
  Administered 2018-09-07: 1 via INTRADERMAL

## 2018-09-07 MED ORDER — SODIUM CHLORIDE (PF) 0.9 % IJ SOLN
INTRAVENOUS | Status: DC | PRN
  Administered 2018-09-07: 14:00:00 via INTRAMUSCULAR

## 2018-09-07 MED ORDER — HEPARIN SOD (PORK) LOCK FLUSH 100 UNIT/ML IV SOLN
INTRAVENOUS | Status: DC | PRN
  Administered 2018-09-07: 500 [IU] via INTRAVENOUS

## 2018-09-07 MED ORDER — ROPIVACAINE HCL 5 MG/ML IJ SOLN
INTRAMUSCULAR | Status: DC | PRN
  Administered 2018-09-07 (×6): 5 mL via PERINEURAL

## 2018-09-07 MED ORDER — LACTATED RINGERS IV SOLN
INTRAVENOUS | Status: DC
  Administered 2018-09-07 (×2): via INTRAVENOUS

## 2018-09-07 MED ORDER — PROPOFOL 10 MG/ML IV BOLUS
INTRAVENOUS | Status: DC | PRN
  Administered 2018-09-07: 150 mg via INTRAVENOUS

## 2018-09-07 MED ORDER — SODIUM CHLORIDE 0.9 % IV SOLN: 250.0000 mL | INTRAVENOUS | Status: DC | PRN

## 2018-09-07 MED ORDER — DEXAMETHASONE SODIUM PHOSPHATE 10 MG/ML IJ SOLN
INTRAMUSCULAR | Status: DC | PRN
  Administered 2018-09-07: 4 mg via INTRAVENOUS

## 2018-09-07 MED ORDER — CELECOXIB 200 MG PO CAPS
200.0000 mg | ORAL_CAPSULE | ORAL | Status: AC
  Administered 2018-09-07: 200 mg via ORAL
  Filled 2018-09-07: qty 1

## 2018-09-07 MED ORDER — LIDOCAINE 2% (20 MG/ML) 5 ML SYRINGE
INTRAMUSCULAR | Status: DC | PRN
  Administered 2018-09-07: 100 mg via INTRAVENOUS

## 2018-09-07 MED ORDER — HYDROCODONE-ACETAMINOPHEN 5-325 MG PO TABS: 1.0000 | ORAL_TABLET | Freq: Four times a day (QID) | ORAL | 0 refills | Status: DC | PRN

## 2018-09-07 SURGICAL SUPPLY — 74 items
ADH SKN CLS APL DERMABOND .7 (GAUZE/BANDAGES/DRESSINGS) ×4
APPLIER CLIP 9.375 MED OPEN (MISCELLANEOUS) ×3
APR CLP MED 9.3 20 MLT OPN (MISCELLANEOUS) ×2
BAG DECANTER FOR FLEXI CONT (MISCELLANEOUS) ×3 IMPLANT
BINDER BREAST LRG (GAUZE/BANDAGES/DRESSINGS) IMPLANT
BINDER BREAST XLRG (GAUZE/BANDAGES/DRESSINGS) ×1 IMPLANT
BLADE CLIPPER SURG (BLADE) IMPLANT
BLADE SURG 10 STRL SS (BLADE) ×2 IMPLANT
BLADE SURG 15 STRL LF DISP TIS (BLADE) ×4 IMPLANT
BLADE SURG 15 STRL SS (BLADE) ×6
CANISTER SUCT 3000ML PPV (MISCELLANEOUS) ×3 IMPLANT
CHLORAPREP W/TINT 26ML (MISCELLANEOUS) ×3 IMPLANT
CLIP APPLIE 9.375 MED OPEN (MISCELLANEOUS) ×2 IMPLANT
CONT SPEC 4OZ CLIKSEAL STRL BL (MISCELLANEOUS) ×3 IMPLANT
COUNTER NEEDLE 20 DBL MAG RED (NEEDLE) ×1 IMPLANT
COVER PROBE W GEL 5X96 (DRAPES) ×3 IMPLANT
COVER SURGICAL LIGHT HANDLE (MISCELLANEOUS) ×3 IMPLANT
COVER TRANSDUCER ULTRASND GEL (DRAPE) ×1 IMPLANT
COVER WAND RF STERILE (DRAPES) ×3 IMPLANT
CRADLE DONUT ADULT HEAD (MISCELLANEOUS) ×3 IMPLANT
DERMABOND ADVANCED (GAUZE/BANDAGES/DRESSINGS) ×2
DERMABOND ADVANCED .7 DNX12 (GAUZE/BANDAGES/DRESSINGS) ×2 IMPLANT
DEVICE DUBIN SPECIMEN MAMMOGRA (MISCELLANEOUS) ×2 IMPLANT
DRAPE C-ARM 42X72 X-RAY (DRAPES) ×3 IMPLANT
DRAPE CHEST BREAST 15X10 FENES (DRAPES) ×3 IMPLANT
DRAPE HALF SHEET 40X57 (DRAPES) ×3 IMPLANT
DRAPE LAPAROSCOPIC ABDOMINAL (DRAPES) ×2 IMPLANT
DRAPE UTILITY XL STRL (DRAPES) ×3 IMPLANT
DRSG PAD ABDOMINAL 8X10 ST (GAUZE/BANDAGES/DRESSINGS) ×3 IMPLANT
ELECT CAUTERY BLADE 6.4 (BLADE) ×4 IMPLANT
ELECT REM PT RETURN 9FT ADLT (ELECTROSURGICAL) ×3
ELECTRODE REM PT RTRN 9FT ADLT (ELECTROSURGICAL) ×2 IMPLANT
FILTER STRAW FLUID ASPIR (MISCELLANEOUS) ×1 IMPLANT
GAUZE 4X4 16PLY RFD (DISPOSABLE) ×3 IMPLANT
GAUZE SPONGE 4X4 12PLY STRL (GAUZE/BANDAGES/DRESSINGS) ×3 IMPLANT
GLOVE EUDERMIC 7 POWDERFREE (GLOVE) ×3 IMPLANT
GOWN STRL REUS W/ TWL LRG LVL3 (GOWN DISPOSABLE) ×2 IMPLANT
GOWN STRL REUS W/ TWL XL LVL3 (GOWN DISPOSABLE) ×2 IMPLANT
GOWN STRL REUS W/TWL LRG LVL3 (GOWN DISPOSABLE) ×3
GOWN STRL REUS W/TWL XL LVL3 (GOWN DISPOSABLE) ×3
INTRODUCER 13FR (INTRODUCER) IMPLANT
INTRODUCER COOK 11FR (CATHETERS) IMPLANT
KIT BASIN OR (CUSTOM PROCEDURE TRAY) ×3 IMPLANT
KIT MARKER MARGIN INK (KITS) ×3 IMPLANT
KIT PORT POWER 8FR ISP CVUE (Port) ×1 IMPLANT
KIT TURNOVER KIT B (KITS) ×3 IMPLANT
MARKER SKIN DUAL TIP RULER LAB (MISCELLANEOUS) ×1 IMPLANT
NDL HYPO 25GX1X1/2 BEV (NEEDLE) ×2 IMPLANT
NDL SAFETY ECLIPSE 18X1.5 (NEEDLE) IMPLANT
NEEDLE HYPO 18GX1.5 SHARP (NEEDLE)
NEEDLE HYPO 25GX1X1/2 BEV (NEEDLE) ×3 IMPLANT
NS IRRIG 1000ML POUR BTL (IV SOLUTION) ×3 IMPLANT
PACK SURGICAL SETUP 50X90 (CUSTOM PROCEDURE TRAY) ×3 IMPLANT
PAD ABD 8X10 STRL (GAUZE/BANDAGES/DRESSINGS) ×1 IMPLANT
PAD ARMBOARD 7.5X6 YLW CONV (MISCELLANEOUS) ×3 IMPLANT
PENCIL BUTTON HOLSTER BLD 10FT (ELECTRODE) ×3 IMPLANT
SET INTRODUCER 12FR PACEMAKER (INTRODUCER) IMPLANT
SET SHEATH INTRODUCER 10FR (MISCELLANEOUS) IMPLANT
SHEATH COOK PEEL AWAY SET 9F (SHEATH) IMPLANT
SPONGE LAP 4X18 RFD (DISPOSABLE) ×3 IMPLANT
SURGILUBE 3G PEEL PACK STRL (MISCELLANEOUS) IMPLANT
SUT MNCRL AB 4-0 PS2 18 (SUTURE) ×6 IMPLANT
SUT PROLENE 2 0 CT2 30 (SUTURE) ×3 IMPLANT
SUT SILK 2 0 SH (SUTURE) ×3 IMPLANT
SUT VIC AB 3-0 SH 18 (SUTURE) ×4 IMPLANT
SYR 10ML LL (SYRINGE) ×6 IMPLANT
SYR 5ML LUER SLIP (SYRINGE) ×3 IMPLANT
SYR BULB 3OZ (MISCELLANEOUS) ×3 IMPLANT
SYR CONTROL 10ML LL (SYRINGE) ×3 IMPLANT
TOWEL OR 17X24 6PK STRL BLUE (TOWEL DISPOSABLE) ×3 IMPLANT
TOWEL OR 17X26 10 PK STRL BLUE (TOWEL DISPOSABLE) ×3 IMPLANT
TRAY LAPAROSCOPIC MC (CUSTOM PROCEDURE TRAY) ×3 IMPLANT
TUBE CONNECTING 12X1/4 (SUCTIONS) ×3 IMPLANT
YANKAUER SUCT BULB TIP NO VENT (SUCTIONS) ×3 IMPLANT

## 2018-09-07 NOTE — Anesthesia Preprocedure Evaluation (Signed)
Anesthesia Evaluation  Patient identified by MRN, date of birth, ID band Patient awake    Reviewed: Allergy & Precautions, NPO status   History of Anesthesia Complications (+) PONV and history of anesthetic complications  Airway Mallampati: I       Dental no notable dental hx. (+) Teeth Intact   Pulmonary neg pulmonary ROS,    Pulmonary exam normal breath sounds clear to auscultation       Cardiovascular hypertension, Pt. on medications Normal cardiovascular exam Rhythm:Regular Rate:Normal     Neuro/Psych PSYCHIATRIC DISORDERS Anxiety Depression    GI/Hepatic negative GI ROS, Neg liver ROS,   Endo/Other  Hypothyroidism   Renal/GU negative Renal ROS  negative genitourinary   Musculoskeletal negative musculoskeletal ROS (+)   Abdominal (+) + obese,   Peds  Hematology negative hematology ROS (+)   Anesthesia Other Findings   Reproductive/Obstetrics                             Anesthesia Physical Anesthesia Plan  ASA: II  Anesthesia Plan: General   Post-op Pain Management:  Regional for Post-op pain   Induction: Intravenous  PONV Risk Score and Plan: 3 and Ondansetron, Dexamethasone and Midazolam  Airway Management Planned: LMA  Additional Equipment:   Intra-op Plan:   Post-operative Plan:   Informed Consent: I have reviewed the patients History and Physical, chart, labs and discussed the procedure including the risks, benefits and alternatives for the proposed anesthesia with the patient or authorized representative who has indicated his/her understanding and acceptance.     Plan Discussed with: CRNA  Anesthesia Plan Comments:         Anesthesia Quick Evaluation

## 2018-09-07 NOTE — Anesthesia Postprocedure Evaluation (Signed)
Anesthesia Post Note  Patient: Tiffany Montes  Procedure(s) Performed: LEFT BREAST LUMPECTOMY WITH RADIOACTIVE SEED AND AXILLARY SENTINEL LYMPH NODE BIOPSY,INJECT BLUE DYE LEFT BREAST (Left Breast) INSERTION PORT-A-CATH WITH [place] (N/A Chest)     Patient location during evaluation: PACU Anesthesia Type: General Level of consciousness: awake Pain management: pain level controlled Vital Signs Assessment: post-procedure vital signs reviewed and stable Respiratory status: spontaneous breathing Cardiovascular status: stable Postop Assessment: no apparent nausea or vomiting Anesthetic complications: no    Last Vitals:  Vitals:   09/07/18 1530 09/07/18 1545  BP: 135/81 120/75  Pulse: 74 71  Resp: 13 12  Temp:    SpO2: 97% 96%    Last Pain:  Vitals:   09/07/18 1545  TempSrc:   PainSc: 0-No pain   Pain Goal: Patients Stated Pain Goal: 2 (09/07/18 1110)               Huston Foley

## 2018-09-07 NOTE — Interval H&P Note (Signed)
History and Physical Interval Note:  09/07/2018 12:20 PM  Tiffany Montes  has presented today for surgery, with the diagnosis of LEFT BREAST CANCER  The various methods of treatment have been discussed with the patient and family. After consideration of risks, benefits and other options for treatment, the patient has consented to  Procedure(s): LEFT BREAST LUMPECTOMY WITH RADIOACTIVE SEED AND AXILLARY SENTINEL LYMPH NODE BIOPSY,INJECT BLUE DYE LEFT BREAST (Left) INSERTION PORT-A-CATH WITH [place] (N/A) as a surgical intervention .  The patient's history has been reviewed, patient examined, no change in status, stable for surgery.  I have reviewed the patient's chart and labs.  Questions were answered to the patient's satisfaction.     Adin Hector

## 2018-09-07 NOTE — Anesthesia Procedure Notes (Addendum)
Anesthesia Regional Block: Pectoralis block   Pre-Anesthetic Checklist: ,, timeout performed, Correct Patient, Correct Site, Correct Laterality, Correct Procedure, Correct Position, site marked, Risks and benefits discussed,  Surgical consent,  Pre-op evaluation,  At surgeon's request and post-op pain management  Laterality: Left and N/A  Prep: chloraprep       Needles:  Injection technique: Single-shot  Needle Type: Echogenic Stimulator Needle     Needle Length: 10cm  Needle Gauge: 21   Needle insertion depth: 3.5 cm   Additional Needles:   Procedures:,,,, ultrasound used (permanent image in chart),,,,  Narrative:  Start time: 09/07/2018 12:20 PM End time: 09/07/2018 12:29 PM Injection made incrementally with aspirations every 5 mL.  Performed by: Personally  Anesthesiologist: Lyn Hollingshead, MD

## 2018-09-07 NOTE — Op Note (Signed)
Patient Name:           Tiffany Montes   Date of Surgery:        09/07/2018  Pre op Diagnosis:      Invasive cancer left breast, upper inner quadrant, triple positive breast cancer.  Post op Diagnosis:    Same  Procedure:                 Inject blue dye left breast                                      Left breast lumpectomy with radioactive seed localization                                       Left axillary deep sentinel lymph node biopsy                                       Insertion PowerPort [place] 8 French tunneled venous vascular access device                                       Use of fluoroscopy for guidance and positioning                                       Use of ultrasound for venipuncture guidance  Surgeon:                     Edsel Petrin. Dalbert Batman, M.D., FACS  Assistant:                      OR staff  Operative Indications:   This is a pleasant, 62 year old female. She was referred by Dr. Dorise Bullion at the [facility] for a cancer of the left breast upper inner quadrant. She is seen in the [facility] by Dr. Jana Hakim, Dr. Lisbeth Renshaw, and me. Carolann Littler is her PCP. [name] is her gynecologist.       She had a left breast biopsy several years ago for benign disease. Otherwise no breast problems. Recent screening mammogram show an 8 mm mass in the left breast, 9:30 position, 4 cm from the nipple. Axillary ultrasound negative. 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 She was discussed at breast conference. Recommendation was to proceed with definitive breast surgery, Port-A-Cath insertion, and adjuvant Herceptin-based chemotherapy. She basically agrees with all this     Comorbidities include hypertension. Cholecystectomy by Dr. Margot Chimes. Attention deficit disorder well-controlled. TAH and BSO through Pfannenstiel incision. Family history reveals mother is living at age 47. Father's deceased had lung cancer but survived that  and died of congestive heart failure and diabetes. There is no family history of breast, ovarian, or prostate cancer      I had  a long discussion with her about breast cancer management in general and specifically surgical  breast management. We discussed the options of lumpectomy sentinel node biopsy and compared to mastectomy with or without reconstruction. She desires breast conservation think she is  a good candidate for that.       She quit her estrogens recently and knows not to restart that. She knows that she may be offered anti-estrogens at a later date.       She will be scheduled for a left breast lumpectomy with radioactive seed localization, left axillary deep Sentinel lymph node biopsy, and Port-A-Cath insertion with ultrasound.She agrees with this plan.  Operative Findings:       We attempted to place the Port-A-Cath through the right subclavian vein but got no blood return.  We then placed the Port-A-Cath through the right internal jugular vein without difficulty and it was functioning well at the completion of the case and C arm imaging looked good.  The left breast lumpectomy was performed using neoprobe guidance and the specimen mammogram looked very good showing the seed and marker clip in the relative center of the specimen.  In the left axilla I found 3 or 4 sentinel lymph nodes.  These were sent for routine histology  Procedure in Detail:          Following the induction of general LMA anesthesia the patient was positioned with a roll behind her shoulders and her arms tucked at her side.  The neck and chest were prepped and draped in a sterile fashion.  0.25% Marcaine with epinephrine was used as a local infiltration anesthetic.  A right subclavian venipuncture was attempted but after 3 attempts there was absolutely no blood return.  I then performed a right internal jugular venipuncture with ultrasound guidance with good blood return.  The wire was inserted without difficulty.  C  arm imaging confirmed the wire in the inferior vena cava.  A small transverse incision was made at the wire insertion site.  A transverse incision was made about 3 cm below the right clavicle, mid position.  A subcutaneous pocket was created.  Using a tunneling device I passed the catheter from the wire insertion site to the port pocket site.  I drew a template on the chest wall using the C arm to measure the catheter length so that it would be in the superior vena cava near the right atrium.  The catheter was cut 26 cm in length.  The catheter was secured to the port with the locking device and the port and catheter flushed with heparinized saline.  The port was sutured to the pectoralis fascia with 3 interrupted sutures of 2-0 Prolene.  The dilator and peel-away sheath assembly were inserted over the guidewire without difficulty.  The wire and dilator were removed.  The catheter threaded easily and the peel-away sheath removed.  I had excellent blood return and the catheter flushed easily.  Fluoroscopy confirmed the catheter tip in the superior vena cava at the near the right atrium.  The catheter and port were flushed with concentrated heparin.  Hemostasis was excellent.  The subcutaneous tissue was closed with 3-0 Vicryl sutures and the skin incisions closed with subcuticular 4-0 Monocryl and Dermabond.     Patient was then repositioned with both arms out on arm boards.  The neck and chest and axilla were then prepped and draped again in a sterile fashion.  Using the neoprobe I found the radioactive seed in the upper inner quadrant of the left breast.  A somewhat transverse skin crease incision was made in Langer's lines.  Lumpectomy was performed using the neoprobe and electrocautery.  The specimen was removed and marked with silk sutures and a 6 color ink  kit.  The specimen mammogram looked good.  The specimen was sent to the pathology lab where the seed was retrieved.  The lumpectomy incision was irrigated.   Hemostasis excellent.  5 metal marker clips were placed in the walls of the lumpectomy cavity.  The lumpectomy breast tissue were closed in a superficial and deep layer with interrupted 3-0 Vicryls and the skin closed with a running subcuticular 4-0 Monocryl and Dermabond.     A transverse incision was made in the left axilla.  This was at the hairline.  Dissection was carried down through the clavipectoral fascia and the axillary space entered.  There was not a lot of radioactivity but I found two blue lymph nodes and another node with some radioactivity and removed all of these and sent these as sentinel lymph node specimens.  Hemostasis was excellent.  This wound was irrigated with saline.  The clavipectoral fascia was closed with interrupted 3-0 Vicryl sutures and the skin closed with a running subcuticular 4-0 Monocryl and Dermabond.  The patient tolerated the procedure well and was taken to PACU in stable condition.  EBL 25 cc.  Counts correct.  Complications none.  Chest x-ray is planned    Addendum: I logged onto the [facility] website and reviewed her prescription medication history     Miquan Tandon M. Dalbert Batman, M.D., FACS General and Minimally Invasive Surgery Breast and Colorectal Surgery  09/07/2018 3:13 PM

## 2018-09-07 NOTE — Discharge Instructions (Signed)
PORT-A-CATH: POST OP INSTRUCTIONS  Always review your discharge instruction sheet given to you by the facility where your surgery was performed.   1. A prescription for pain medication may be given to you upon discharge. Take your pain medication as prescribed, if needed. If narcotic pain medicine is not needed, then you make take acetaminophen (Tylenol) or ibuprofen (Advil) as needed.  2. Take your usually prescribed medications unless otherwise directed. 3. If you need a refill on your pain medication, please contact our office. All narcotic pain medicine now requires a paper prescription.  Phoned in and fax refills are no longer allowed by law.  Prescriptions will not be filled after 5 pm or on weekends.  4. You should follow a light diet for the remainder of the day after your procedure. 5. Most patients will experience some mild swelling and/or bruising in the area of the incision. It may take several days to resolve. 6. It is common to experience some constipation if taking pain medication after surgery. Increasing fluid intake and taking a stool softener (such as Colace) will usually help or prevent this problem from occurring. A mild laxative (Milk of Magnesia or Miralax) should be taken according to package directions if there are no bowel movements after 48 hours.  7. Unless discharge instructions indicate otherwise, you may remove your bandages 48 hours after surgery, and you may shower at that time. You may have steri-strips (small white skin tapes) in place directly over the incision.  These strips should be left on the skin for 7-10 days.  If your surgeon used Dermabond (skin glue) on the incision, you may shower in 24 hours.  The glue will flake off over the next 2-3 weeks.  8. If your port is left accessed at the end of surgery (needle left in port), the dressing cannot get wet and should only by changed by a healthcare professional. When the port is no longer accessed (when the  needle has been removed), follow step 7.   9. ACTIVITIES:  Limit activity involving your arms for the next 72 hours. Do no strenuous exercise or activity for 1 week. You may drive when you are no longer taking prescription pain medication, you can comfortably wear a seatbelt, and you can maneuver your car. 10.You may need to see your doctor in the office for a follow-up appointment.  Please       check with your doctor.  11.When you receive a new Port-a-Cath, you will get a product guide and        ID card.  Please keep them in case you need them.  WHEN TO CALL YOUR DOCTOR [phone]): 1. Fever over 101.0 2. Chills 3. Continued bleeding from incision 4. Increased redness and tenderness at the site 5. Shortness of breath, difficulty breathing   The clinic staff is available to answer your questions during regular business hours. Please dont hesitate to call and ask to speak to one of the nurses or medical assistants for clinical concerns. If you have a medical emergency, go to the nearest emergency room or call 911.  A surgeon from [facility] is always on call at the hospital.     For further information, please visit [email or web address]    [employer] Phone Number [phone]  BREAST BIOPSY/ PARTIAL MASTECTOMY: POST OP INSTRUCTIONS  Always review your discharge instruction sheet given to you by the facility where your surgery was performed.  IF YOU HAVE DISABILITY OR FAMILY  LEAVE FORMS, YOU MUST BRING THEM TO THE OFFICE FOR PROCESSING.  DO NOT GIVE THEM TO YOUR DOCTOR.  1. A prescription for pain medication may be given to you upon discharge.  Take your pain medication as prescribed, if needed.  If narcotic pain medicine is not needed, then you may take acetaminophen (Tylenol) or ibuprofen (Advil) as needed. 2. Take your usually prescribed medications unless otherwise directed 3. If you need a refill on your pain medication,  please contact your pharmacy.  They will contact our office to request authorization.  Prescriptions will not be filled after 5pm or on week-ends. 4. You should eat very light the first 24 hours after surgery, such as soup, crackers, pudding, etc.  Resume your normal diet the day after surgery. 5. Most patients will experience some swelling and bruising in the breast.  Ice packs and a good support bra will help.  Swelling and bruising can take several days to resolve.  6. It is common to experience some constipation if taking pain medication after surgery.  Increasing fluid intake and taking a stool softener will usually help or prevent this problem from occurring.  A mild laxative (Milk of Magnesia or Miralax) should be taken according to package directions if there are no bowel movements after 48 hours. 7. Unless discharge instructions indicate otherwise, you may remove your bandages 24-48 hours after surgery, and you may shower at that time.  You may have steri-strips (small skin tapes) in place directly over the incision.  These strips should be left on the skin for 7-10 days.  If your surgeon used skin glue on the incision, you may shower in 24 hours.  The glue will flake off over the next 2-3 weeks.  Any sutures or staples will be removed at the office during your follow-up visit. 8. ACTIVITIES:  You may resume regular daily activities (gradually increasing) beginning the next day.  Wearing a good support bra or sports bra minimizes pain and swelling.  You may have sexual intercourse when it is comfortable. a. You may drive when you no longer are taking prescription pain medication, you can comfortably wear a seatbelt, and you can safely maneuver your car and apply brakes. b. RETURN TO WORK:  ______________________________________________________________________________________ 9. You should see your doctor in the office for a follow-up appointment approximately two weeks after your surgery.  Your  doctors nurse will typically make your follow-up appointment when she calls you with your pathology report.  Expect your pathology report 2-3 business days after your surgery.  You may call to check if you do not hear from [place] after three days. 10. OTHER INSTRUCTIONS: _______________________________________________________________________________________________ _____________________________________________________________________________________________________________________________________ _____________________________________________________________________________________________________________________________________ _____________________________________________________________________________________________________________________________________  WHEN TO CALL YOUR DOCTOR: 1. Fever over 101.0 2. Nausea and/or vomiting. 3. Extreme swelling or bruising. 4. Continued bleeding from incision. 5. Increased pain, redness, or drainage from the incision.  The clinic staff is available to answer your questions during regular business hours.  Please dont hesitate to call and ask to speak to one of the nurses for clinical concerns.  If you have a medical emergency, go to the nearest emergency room or call 911.  A surgeon from [facility] is always on call at the hospital.  For further questions, please visit [email or web address]

## 2018-09-07 NOTE — Anesthesia Procedure Notes (Signed)
Procedure Name: LMA Insertion Date/Time: 09/07/2018 1:08 PM Performed by: Jearld Pies, CRNA Pre-anesthesia Checklist: Patient identified, Emergency Drugs available, Suction available and Patient being monitored Patient Re-evaluated:Patient Re-evaluated prior to induction Oxygen Delivery Method: Circle System Utilized Preoxygenation: Pre-oxygenation with 100% oxygen Induction Type: IV induction Ventilation: Mask ventilation without difficulty LMA: LMA inserted LMA Size: 4.0 Number of attempts: 1 Airway Equipment and Method: Bite block Placement Confirmation: positive ETCO2 Tube secured with: Tape Dental Injury: Teeth and Oropharynx as per pre-operative assessment

## 2018-09-07 NOTE — Transfer of Care (Signed)
Immediate Anesthesia Transfer of Care Note  Patient: Tiffany Montes  Procedure(s) Performed: LEFT BREAST LUMPECTOMY WITH RADIOACTIVE SEED AND AXILLARY SENTINEL LYMPH NODE BIOPSY,INJECT BLUE DYE LEFT BREAST (Left Breast) INSERTION PORT-A-CATH WITH [place] (N/A Chest)  Patient Location: PACU  Anesthesia Type:General  Level of Consciousness: drowsy and patient cooperative  Airway & Oxygen Therapy: Patient Spontanous Breathing and Patient connected to nasal cannula oxygen  Post-op Assessment: Report given to RN and Post -op Vital signs reviewed and stable  Post vital signs: Reviewed and stable  Last Vitals:  Vitals Value Taken Time  BP 145/98 09/07/2018  3:16 PM  Temp    Pulse 78 09/07/2018  3:17 PM  Resp 16 09/07/2018  3:17 PM  SpO2 97 % 09/07/2018  3:17 PM  Vitals shown include unvalidated device data.  Last Pain:  Vitals:   09/07/18 1110  TempSrc: Oral  PainSc: 0-No pain      Patients Stated Pain Goal: 2 (37/48/27 0786)  Complications: No apparent anesthesia complications   Report to [facility]

## 2018-09-07 NOTE — Addendum Note (Signed)
Addendum  created 09/07/18 1610 by Jearld Pies, CRNA   Intraprocedure Meds edited

## 2018-09-08 ENCOUNTER — Encounter (HOSPITAL_COMMUNITY): Payer: Self-pay | Admitting: General Surgery

## 2018-09-12 NOTE — Progress Notes (Signed)
[facility]  [phone] [phone]     ID: Tiffany Montes DOB: 1956/04/19  MR#: [id number]  MAU#:[id number]  Patient Care Team: Eulas Post, MD as PCP - Frederich Chick, MD as Consulting Physician (General Surgery) Magrinat, Virgie Dad, MD as Consulting Physician (Oncology) Kyung Rudd, MD as Consulting Physician (Radiation Oncology) Maisie Fus, MD as Consulting Physician (Obstetrics and Gynecology) OTHER MD:   CHIEF COMPLAINT: Estrogen receptor positive breast cancer, HER-2 amplified  CURRENT TREATMENT: Adjuvant chemotherapy   HISTORY OF CURRENT ILLNESS: From the original intake note:  "Tiffany Montes" had routine screening mammography on 07/22/2018 showing a possible abnormality in the left breast. She underwent unilateral diagnostic mammography with tomography and left breast ultrasonography at [facility] on 07/28/2018 showing: Breast density Category C; a 7 x 8 x 8 mm hypoechoic mass in the left breast lower inner quadrant, consistent with a complex cyst.. No abnormal lymph nodes in the left axilla.  Accordingly on 07/31/2018 she proceeded to biopsy of the left breast area in question. The pathology from this procedure showed ([id number]): Invasive Ductal Carcinoma, Grade 3. Prognostic indicators significant for: estrogen receptor, 95% positive and progesterone receptor, 85% positive, both with strong staining intensity. Proliferation marker Ki67 at 75%. HER2 positive by immunohistochemistry, 3+.   The patient's subsequent history is as detailed below.  INTERVAL HISTORY: "Tiffany Montes" is here for her follow up  and treatment of her estrogen receptor positive breast cancer, HER-2 amplified visit, she is accompanied by daughter.   The patient is doing well s/p lumpectomy. She was a bit sore in axillary area but takes Advil is she has any pain, she took only a few pain medication after surgery. She is having hot flashes, several times a  day with night sweats, she has leave of absence, her FMLA, until October 07, 2018.    Since her last visit she underwent a left breast lumpectomy with radioactive seed and axillary sentinel lymph node biopsy and insertion of port-a-cath on 09/07/18 that showed: Breast, lumpectomy, Left with seed - INVASIVE DUCTAL CARCINOMA, [place]HAM GRADE 3 OF 3, 1.1 CM - DUCTAL CARCINOMA IN SITU, HIGH GRADE, WITH COMEDO NECROSIS - MARGINS UNINVOLVED BY CARCINOMA (0.1 CM; INFERIOR MARGIN) - PREVIOUS BIOPSY SITE CHANGES PRESENT - SEE ONCOLOGY TABLE AND COMMENT BELOW  Lymph node, sentinel, biopsy, left axillary - NO CARCINOMA IDENTIFIED IN ONE LYMPH NODE (0/1)  Lymph node, sentinel, biopsy, left NO CARCINOMA IDENTIFIED IN ONE LYMPH NODE (0/1)  Lymph node, sentinel, biopsy, left - NO CARCINOMA IDENTIFIED IN ONE LYMPH NODE (0/1)  Lymph node, sentinel, biopsy, left - NO CARCINOMA IDENTIFIED IN ONE LYMPH NODE (0/1)  Lymph node, sentinel, biopsy, left - NO CARCINOMA IDENTIFIED IN ONE LYMPH NODE (0/1)   REVIEW OF SYSTEMS: Tiffany Montes is doing well overall. She denies unusual headaches, visual changes, nausea, vomiting, or dizziness. There has been no unusual cough, phlegm production, or pleurisy. This been no change in bowel or bladder habits. She denies unexplained fatigue or unexplained weight loss, bleeding, rash, or fever. A detailed review of systems was otherwise stable.     PAST MEDICAL HISTORY: Past Medical History:  Diagnosis Date  . Anxiety   . Attention deficit disorder   . Cancer ([place])    Left breast CA  . Depression   . Gallstones 09/05/2011  . Hyperlipidemia   . Hypertension   . Hypothyroidism   . Migraines   . PONV (postoperative nausea and vomiting)    diffficulty waking up  .  Thyroid disease    hypothyroid    PAST SURGICAL HISTORY: Past Surgical History:  Procedure Laterality Date  . ABDOMINAL HYSTERECTOMY  2003  . BREAST BIOPSY Left 2014   benign  . BREAST LUMPECTOMY  WITH RADIOACTIVE SEED AND SENTINEL LYMPH NODE BIOPSY Left 09/07/2018   Procedure: LEFT BREAST LUMPECTOMY WITH RADIOACTIVE SEED AND AXILLARY SENTINEL LYMPH NODE BIOPSY,INJECT BLUE DYE LEFT BREAST;  Surgeon: Fanny Skates, MD;  Location: [facility];  Service: General;  Laterality: Left;  . CHOLECYSTECTOMY  09/26/2011   Procedure: LAPAROSCOPIC CHOLECYSTECTOMY WITH INTRAOPERATIVE CHOLANGIOGRAM;  Surgeon: Haywood Lasso, MD;  Location: [facility];  Service: General;  Laterality: N/A;  . COLONOSCOPY    . CYSTIC HYGROMA EXCISION    . DENTAL SURGERY     skin graft from top of mouth to lower gums  . PORTACATH PLACEMENT N/A 09/07/2018   Procedure: INSERTION PORT-A-CATH WITH [place];  Surgeon: Fanny Skates, MD;  Location: [facility] OR;  Service: General;  Laterality: N/A;    FAMILY HISTORY Family History  Problem Relation Age of Onset  . Diabetes Father   . Cancer Father        lung   . Heart disease Father    She notes that her father died from CHF at age 63.  He was diagnosed with lung cancer at age 67. Patients' mother is 32 years old as of November 2019. The patient has 1 brother and 2 sisters. Patient denies a family history of ovarian or breast cancer.  GYNECOLOGIC HISTORY:  No LMP recorded. Patient has had a hysterectomy. 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 Job is very stressful and schedule is  She is divorced and lives by herself with her 2 dogs. Her daughter [name] is a Dealer in [place], [name].  The patient's son, [name] is a Training and development officer and lives in [place]. The patient has 2 grandchildren and 1 great-grand child.  She is not a church or tender     ADVANCED DIRECTIVES: Her [place] is her daughter,  Tiffany Montes, [phone].     HEALTH MAINTENANCE: Social History   Tobacco Use  . Smoking status: Never Smoker  . Smokeless tobacco: Never Used  Substance Use Topics  . Alcohol use: Yes    Comment: 1-2 a week and reports not every week  . Drug use: No     Colonoscopy: January 2013  PAP: 1 month ago  Bone density: Yes, 2 years ago   Allergies  Allergen Reactions  . Morphine Sulfate Nausea And Vomiting and Rash    GI upset    Current Outpatient Medications  Medication Sig Dispense Refill  . amphetamine-dextroamphetamine (ADDERALL XR) 30 MG 24 hr capsule Take 1 capsule (30 mg total) by mouth daily. 30 capsule 0  . augmented betamethasone dipropionate (DIPROLENE-AF) 0.05 % cream APPLY TO AFFECTED AREA TWICE A DAY (Patient taking differently: Apply 1 application topically 2 (two) times daily as needed (for eczema). ) 30 g 2  . benzonatate (TESSALON) 200 MG capsule TAKE 1 CAPSULE AS NEEDED FOR ALLERGIES(COUGH) (Patient taking differently: Take 200 mg by mouth daily as needed (allergies/cough). ) 60 capsule 0  . BIOTIN PO Take 1 tablet by mouth daily.    Marland Kitchen buPROPion (  WELLBUTRIN XL) 300 MG 24 hr tablet TAKE 1 TABLET BY MOUTH EVERY DAY (Patient taking differently: Take 300 mg by mouth daily. ) 90 tablet 0  . Cholecalciferol (VITAMIN D3 PO) Take 1 tablet by mouth daily.    . clonazePAM (KLONOPIN) 0.5 MG tablet TAKE 1 TABLET BY MOUTH AT BEDTIME AS NEEDED (Patient taking differently: Take 0.5 mg by mouth daily as needed (anxiety/panic attacks.). ) 30 tablet 5  . fluticasone (FLONASE) 50 MCG/ACT nasal spray SPRAY 2 SPRAYS INTO EACH NOSTRIL EVERY DAY (Patient taking differently: Place 2 sprays into both nostrils daily as needed for allergies. ) 16 g 2  . levothyroxine (SYNTHROID, LEVOTHROID) 125 MCG tablet TAKE 1 TABLET BY MOUTH EVERY DAY (Patient taking differently: Take 125 mcg by mouth daily before breakfast. ) 90 tablet 1  . lidocaine-prilocaine (EMLA) cream Apply to affected area once  (Patient taking differently: Apply 1 application topically daily as needed (prior to port is accessed.). ) 30 g 3  . losartan-hydrochlorothiazide (HYZAAR) 50-12.5 MG tablet TAKE 1 TABLET BY MOUTH EVERY DAY (Patient taking differently: Take 1 tablet by mouth daily. ) 90 tablet 1  . Multiple Vitamins-Minerals (MULTIVITAMINS THER. W/MINERALS) TABS Take 1 tablet by mouth daily.      . prochlorperazine (COMPAZINE) 10 MG tablet Take 1 tablet (10 mg total) by mouth every 6 (six) hours as needed (Nausea or vomiting). 30 tablet 1  . sertraline (ZOLOFT) 50 MG tablet TAKE 1 TABLET BY MOUTH EVERY DAY (Patient taking differently: Take 50 mg by mouth daily. ) 90 tablet 2  . SUMAtriptan (IMITREX) 100 MG tablet TAKE 1 TABLET EVERY DAY AS NEEDED FOR MIGRAINE, MAY REPEAT IN 24 HOURS (Patient taking differently: Take 100 mg by mouth every 2 (two) hours as needed for migraine. ) 9 tablet 2   No current facility-administered medications for this visit.     OBJECTIVE: Middle-aged white woman in no acute distress  Vitals:   09/18/18 0853  BP: (!) 148/65  Pulse: 81  Resp: 17  Temp: 97.9 F (36.6 C)  SpO2: 100%     Body mass index is 36.81 kg/m.   Wt Readings from Last 3 Encounters:  09/18/18 204 lb 8 oz (92.8 kg)  09/07/18 202 lb 3.2 oz (91.7 kg)  08/31/18 202 lb 3.2 oz (91.7 kg)      ECOG FS:1 - Symptomatic but completely ambulatory  Sclerae unicteric, EOMs intact No cervical or supraclavicular adenopathy Lungs no rales or rhonchi Heart regular rate and rhythm Abd soft, nontender, positive bowel sounds MSK no focal spinal tenderness, no upper extremity lymphedema Neuro: nonfocal, well oriented, appropriate affect Breasts: The right breast is unremarkable.  The left breast is status post recent lumpectomy.  The incisions are healing nicely, without dehiscence, erythema, or swelling.  Both axillae are benign.  LAB RESULTS:  CMP     Component Value Date/Time   NA 140 08/31/2018 1422   K 3.7  08/31/2018 1422   CL 109 08/31/2018 1422   CO2 22 08/31/2018 1422   GLUCOSE 121 (H) 08/31/2018 1422   BUN 14 08/31/2018 1422   CREATININE 0.78 08/31/2018 1422   CREATININE 0.89 08/12/2018 1222   CALCIUM 9.3 08/31/2018 1422   PROT 6.8 08/31/2018 1422   ALBUMIN 3.9 08/31/2018 1422   AST 24 08/31/2018 1422   AST 14 (L) 08/12/2018 1222   ALT 20 08/31/2018 1422   ALT 14 08/12/2018 1222   ALKPHOS 60 08/31/2018 1422   BILITOT 0.8 08/31/2018 1422   BILITOT  0.2 (L) 08/12/2018 1222   GFRNONAA >60 08/31/2018 1422   GFRNONAA >60 08/12/2018 1222   GFRAA >60 08/31/2018 1422   GFRAA >60 08/12/2018 1222    No results found for: TOTALPROTELP, ALBUMINELP, A1GS, A2GS, BETS, BETA2SER, GAMS, MSPIKE, SPEI  No results found for: KPAFRELGTCHN, LAMBDASER, KAPLAMBRATIO  Lab Results  Component Value Date   WBC 6.4 09/18/2018   NEUTROABS 3.5 09/18/2018   HGB 13.2 09/18/2018   HCT 41.0 09/18/2018   MCV 87.4 09/18/2018   PLT 216 09/18/2018    @LASTCHEMISTRY @  No results found for: LABCA2  No components found for: KKXFGH829  No results for input(s): INR in the last 168 hours.  No results found for: LABCA2  No results found for: HBZ169  No results found for: CVE938  No results found for: BOF751  No results found for: CA2729  No components found for: HGQUANT  No results found for: CEA1 / No results found for: CEA1   No results found for: AFPTUMOR  No results found for: CHROMOGRNA  No results found for: PSA1  Appointment on 09/18/2018  Component Date Value Ref Range Status  . WBC 09/18/2018 6.4  4.0 - 10.5 K/uL Final  . RBC 09/18/2018 4.69  3.87 - 5.11 MIL/uL Final  . Hemoglobin 09/18/2018 13.2  12.0 - 15.0 g/dL Final  . HCT 09/18/2018 41.0  36.0 - 46.0 % Final  . MCV 09/18/2018 87.4  80.0 - 100.0 fL Final  . MCH 09/18/2018 28.1  26.0 - 34.0 pg Final  . MCHC 09/18/2018 32.2  30.0 - 36.0 g/dL Final  . RDW 09/18/2018 12.0  11.5 - 15.5 % Final  . Platelets 09/18/2018 216  150 -  400 K/uL Final  . nRBC 09/18/2018 0.0  0.0 - 0.2 % Final  . Neutrophils Relative % 09/18/2018 55  % Final  . Neutro Abs 09/18/2018 3.5  1.7 - 7.7 K/uL Final  . Lymphocytes Relative 09/18/2018 32  % Final  . Lymphs Abs 09/18/2018 2.1  0.7 - 4.0 K/uL Final  . Monocytes Relative 09/18/2018 8  % Final  . Monocytes Absolute 09/18/2018 0.5  0.1 - 1.0 K/uL Final  . Eosinophils Relative 09/18/2018 4  % Final  . Eosinophils Absolute 09/18/2018 0.3  0.0 - 0.5 K/uL Final  . Basophils Relative 09/18/2018 1  % Final  . Basophils Absolute 09/18/2018 0.0  0.0 - 0.1 K/uL Final  . Immature Granulocytes 09/18/2018 0  % Final  . Abs Immature Granulocytes 09/18/2018 0.02  0.00 - 0.07 K/uL Final   Performed at [facility], [address] [place], [place] [address]    (this displays the last labs from the last 3 days)  No results found for: TOTALPROTELP, ALBUMINELP, A1GS, A2GS, BETS, BETA2SER, GAMS, MSPIKE, SPEI (this displays SPEP labs)  No results found for: KPAFRELGTCHN, LAMBDASER, KAPLAMBRATIO (kappa/lambda light chains)  No results found for: HGBA, HGBA2QUANT, HGBFQUANT, HGBSQUAN (Hemoglobinopathy evaluation)   No results found for: LDH  No results found for: IRON, TIBC, IRONPCTSAT (Iron and TIBC)  No results found for: FERRITIN  Urinalysis    Component Value Date/Time   COLORURINE YELLOW 09/13/2011 [place] 09/13/2011 0952   LABSPEC 1.010 09/13/2011 [place] 6.5 09/13/2011 [place] 09/13/2011 [name] 09/13/2011 [place] negative 04/03/2009 0949   BILIRUBINUR n 09/15/2012 [place] 09/13/2011 [place] n 09/15/2012 [place] NEGATIVE 09/13/2011 2778  UROBILINOGEN 0.2 09/15/2012 0832   UROBILINOGEN 0.2 09/13/2011 0952   NITRITE n 09/15/2012 0832   NITRITE NEGATIVE 09/13/2011 0952   LEUKOCYTESUR Negative 09/15/2012 0832     STUDIES:  Nm Sentinel Node Inj-no Rpt  (breast)  Result Date: 09/07/2018 Sulfur colloid was injected by the nuclear medicine technologist for melanoma sentinel node.   Mm Breast Surgical Specimen  Result Date: 09/07/2018 CLINICAL DATA:  Left lumpectomy for invasive ductal carcinoma. EXAM: SPECIMEN RADIOGRAPH OF THE LEFT BREAST COMPARISON:  Previous exam(s). FINDINGS: Status post excision of the left breast. The radioactive seed and ribbon shaped biopsy marker clip are present, completely intact, and were marked for pathology. IMPRESSION: Specimen radiograph of the left breast. Electronically Signed   By: Claudie Revering M.D.   On: 09/07/2018 15:24   Dg Chest Port 1 View  Result Date: 09/07/2018 CLINICAL DATA:  Status post port placement EXAM: PORTABLE CHEST 1 VIEW COMPARISON:  05/09/2015 FINDINGS: Right-sided chest wall port is noted with the catheter tip in the mid right atrium. No pneumothorax is seen. No focal infiltrate is seen. Postsurgical changes in the left breast are now noted. No acute bony abnormality is seen. IMPRESSION: No pneumothorax following port placement. Electronically Signed   By: Inez Catalina M.D.   On: 09/07/2018 16:06   Dg Fluoro Guide Cv Line-no Report  Result Date: 09/07/2018 Fluoroscopy was utilized by the requesting physician.  No radiographic interpretation.   Mm Lt Radioactive Seed Loc Mammo Guide  Result Date: 09/07/2018 CLINICAL DATA:  The patient presents for needle localization of a ribbon shaped clip in the [place] of the LEFT breast following ultrasound-guided biopsy which show grade 3 invasive ductal carcinoma in the 9:30 o'clock location. EXAM: MAMMOGRAPHIC GUIDED RADIOACTIVE SEED LOCALIZATION OF THE LEFT BREAST COMPARISON:  Previous exam(s). FINDINGS: Patient presents for radioactive seed localization prior to lumpectomy. I met with the patient and we discussed the procedure of seed localization including benefits and alternatives. We discussed the high likelihood of a successful  procedure. We discussed the risks of the procedure including infection, bleeding, tissue injury and further surgery. We discussed the low dose of radioactivity involved in the procedure. Informed, written consent was given. The usual time-out protocol was performed immediately prior to the procedure. Using mammographic guidance, sterile technique, 1% lidocaine and an I-125 radioactive seed, the ribbon shaped clip in the [place] of the LEFT breast was localized using a MEDIAL to LATERAL approach. The follow-up mammogram images confirm the seed in the expected location and were marked for Dr. Dalbert Batman. Follow-up survey of the patient confirms presence of the radioactive seed. Order number of I-125 seed:  [id number]. Total activity:  4.098 millicuries.  Reference Date: 08/17/2018 The patient tolerated the procedure well and was released from the [place] [facility]. She was given instructions regarding seed removal. IMPRESSION: Radioactive seed localization left breast. No apparent complications. Electronically Signed   By: Nolon Nations M.D.   On: 09/07/2018 11:14    ELIGIBLE FOR AVAILABLE RESEARCH PROTOCOL: no  ASSESSMENT: 62 y.o. [place] woman status post left breast lower inner quadrant biopsy 07/31/2018 for a clinical T1b N0, stage IA invasive ductal carcinoma, grade 3, triple positive, with an MIB-1 of 75%.  (1) status post left lumpectomy and sentinel lymph node sampling 09/07/2018 for a pT1c pN0, stage IA invasive ductal carcinoma, grade 3, with negative margins.  (a) a total of 5 sentinel lymph nodes were removed  (2) adjuvant chemotherapy will consist of paclitaxel weekly x12 with trastuzumab  every 21 days starting 10/02/2018  (3) trastuzumab to be continued to complete 1 year  (a) echocardiogram 08/17/2018 shows an ejection fraction of 60-65%  (4) adjuvant radiation as appropriate  (5) antiestrogens to start at the completion of local treatment  PLAN: Tiffany Montes did remarkably well  with her surgery and she is now ready to start adjuvant chemo immunotherapy.  We discussed the possible toxicity side effects and complications of paclitaxel, and she is particularly aware of the risk of peripheral neuropathy, which if it develops may be permanent.  We will be asking regarding this at subsequent visits and if this develops we of course would have to make changes in her treatment plan.  The trastuzumab's main concern is weakening of the heart muscle.  She has a very good baseline echocardiogram and will be followed through our cardio oncology group with echo every 3 months while she is receiving trastuzumab  She will meet with our chemo teaching nurse today to reinforce what we have already gone over and I have asked her to take Compazine the night of treatment at the next morning and then as needed.  She is having hot flashes now off hormone replacement.  Hopefully this will improve in the near future.    It is going to be very difficult for her to get back to work in the next 3 to 4 months and we will be glad to help her fill out any appropriate papers  She will see me again on 10/09/2017, which will be her second dose, to troubleshoot problems from treatment  She knows to call for any other issues that may develop before then.  Magrinat, Virgie Dad, MD  09/18/18 9:31 AM Medical Oncology and Hematology [facility] [address] [place], [place] [address] Tel. [phone]    Fax. [phone]    Elie Goody, am acting as scribe for Dr. Virgie Dad. Magrinat.  I, Lurline Del MD, have reviewed the above documentation for accuracy and completeness, and I agree with the above.

## 2018-09-15 ENCOUNTER — Telehealth: Payer: Self-pay | Admitting: Oncology

## 2018-09-15 NOTE — Telephone Encounter (Signed)
Printed medical records for [facility], Release [id number]

## 2018-09-16 NOTE — Progress Notes (Signed)
FMLA and medical records successfully faxed to [facility] at [phone]. Mailed copy to patient address on file.

## 2018-09-18 ENCOUNTER — Inpatient Hospital Stay: Payer: BLUE CROSS/BLUE SHIELD

## 2018-09-18 ENCOUNTER — Inpatient Hospital Stay: Payer: BLUE CROSS/BLUE SHIELD | Attending: Oncology

## 2018-09-18 ENCOUNTER — Inpatient Hospital Stay (HOSPITAL_BASED_OUTPATIENT_CLINIC_OR_DEPARTMENT_OTHER): Payer: BLUE CROSS/BLUE SHIELD | Admitting: Oncology

## 2018-09-18 ENCOUNTER — Ambulatory Visit: Payer: BLUE CROSS/BLUE SHIELD

## 2018-09-18 VITALS — BP 148/65 | HR 81 | Temp 97.9°F | Resp 17 | Ht 62.5 in | Wt 204.5 lb

## 2018-09-18 DIAGNOSIS — R232 Flushing: Secondary | ICD-10-CM | POA: Insufficient documentation

## 2018-09-18 DIAGNOSIS — Z5112 Encounter for antineoplastic immunotherapy: Secondary | ICD-10-CM | POA: Diagnosis not present

## 2018-09-18 DIAGNOSIS — I10 Essential (primary) hypertension: Secondary | ICD-10-CM

## 2018-09-18 DIAGNOSIS — E785 Hyperlipidemia, unspecified: Secondary | ICD-10-CM

## 2018-09-18 DIAGNOSIS — C50312 Malignant neoplasm of lower-inner quadrant of left female breast: Secondary | ICD-10-CM | POA: Insufficient documentation

## 2018-09-18 DIAGNOSIS — Z170 Estrogen receptor positive status [ER+]: Secondary | ICD-10-CM | POA: Diagnosis not present

## 2018-09-18 DIAGNOSIS — C50212 Malignant neoplasm of upper-inner quadrant of left female breast: Secondary | ICD-10-CM

## 2018-09-18 DIAGNOSIS — Z79899 Other long term (current) drug therapy: Secondary | ICD-10-CM | POA: Insufficient documentation

## 2018-09-18 DIAGNOSIS — Z5111 Encounter for antineoplastic chemotherapy: Secondary | ICD-10-CM

## 2018-09-18 DIAGNOSIS — E039 Hypothyroidism, unspecified: Secondary | ICD-10-CM | POA: Diagnosis not present

## 2018-09-18 DIAGNOSIS — F329 Major depressive disorder, single episode, unspecified: Secondary | ICD-10-CM | POA: Insufficient documentation

## 2018-09-18 LAB — COMPREHENSIVE METABOLIC PANEL
ALK PHOS: 78 U/L (ref 38–126)
ALT: 17 U/L (ref 0–44)
AST: 18 U/L (ref 15–41)
Albumin: 3.7 g/dL (ref 3.5–5.0)
Anion gap: 12 (ref 5–15)
BUN: 24 mg/dL — ABNORMAL HIGH (ref 8–23)
CALCIUM: 9.2 mg/dL (ref 8.9–10.3)
CO2: 25 mmol/L (ref 22–32)
Chloride: 104 mmol/L (ref 98–111)
Creatinine, Ser: 0.88 mg/dL (ref 0.44–1.00)
GFR calc Af Amer: >60 mL/min (ref >60)
GFR calc non Af Amer: >60 mL/min (ref >60)
Glucose, Bld: 128 mg/dL — ABNORMAL HIGH (ref 70–99)
Potassium: 3.5 mmol/L (ref 3.5–5.1)
Sodium: 141 mmol/L (ref 135–145)
Total Bilirubin: 0.3 mg/dL (ref 0.3–1.2)
Total Protein: 6.9 g/dL (ref 6.5–8.1)

## 2018-09-18 LAB — CBC WITH DIFFERENTIAL/PLATELET
Abs Immature Granulocytes: 0.02 10*3/uL (ref 0.00–0.07)
Basophils Absolute: 0 10*3/uL (ref 0.0–0.1)
Basophils Relative: 1 %
Eosinophils Absolute: 0.3 10*3/uL (ref 0.0–0.5)
Eosinophils Relative: 4 %
HCT: 41 % (ref 36.0–46.0)
Hemoglobin: 13.2 g/dL (ref 12.0–15.0)
Immature Granulocytes: 0 %
Lymphocytes Relative: 32 %
Lymphs Abs: 2.1 10*3/uL (ref 0.7–4.0)
MCH: 28.1 pg (ref 26.0–34.0)
MCHC: 32.2 g/dL (ref 30.0–36.0)
MCV: 87.4 fL (ref 80.0–100.0)
MONO ABS: 0.5 10*3/uL (ref 0.1–1.0)
Monocytes Relative: 8 %
Neutro Abs: 3.5 10*3/uL (ref 1.7–7.7)
Neutrophils Relative %: 55 %
Platelets: 216 10*3/uL (ref 150–400)
RBC: 4.69 MIL/uL (ref 3.87–5.11)
RDW: 12 % (ref 11.5–15.5)
WBC: 6.4 10*3/uL (ref 4.0–10.5)
nRBC: 0 % (ref 0.0–0.2)

## 2018-09-24 ENCOUNTER — Other Ambulatory Visit: Payer: Self-pay | Admitting: Family Medicine

## 2018-09-24 NOTE — Telephone Encounter (Signed)
Copied from [place] [phone]. Topic: Quick Communication - Rx Refill/Question >> Sep 24, 2018 11:19 AM Alfredia Ferguson R wrote: Medication: amphetamine-dextroamphetamine (ADDERALL XR) 30 MG 24 hr capsule  Has the patient contacted their pharmacy? Yes  Preferred Pharmacy (with phone number or street name): [id number] - [place], [place]. AT [address] [phone] (Phone) [phone] (Fax)    Agent: Please be advised that RX refills may take up to 3 business days. We ask that you follow-up with your pharmacy.

## 2018-09-25 ENCOUNTER — Ambulatory Visit: Payer: BLUE CROSS/BLUE SHIELD

## 2018-09-25 ENCOUNTER — Ambulatory Visit: Payer: BLUE CROSS/BLUE SHIELD | Admitting: Oncology

## 2018-09-25 ENCOUNTER — Telehealth: Payer: Self-pay | Admitting: *Deleted

## 2018-09-25 ENCOUNTER — Other Ambulatory Visit: Payer: BLUE CROSS/BLUE SHIELD

## 2018-09-25 NOTE — Telephone Encounter (Signed)
Copied from [place] [id number]. Topic: General - Other >> Sep 25, 2018 11:35 AM Carolyn Stare wrote:  Pt call to say her chemo nurse said it would be a good idea for her to have a pneumonia shot before she start taking chemo for breast cancer. Will this be ok

## 2018-09-25 NOTE — Telephone Encounter (Signed)
Last OV 05/27/18, No future OV  Last filled 08/20/18, # 30 with 0 refills

## 2018-09-25 NOTE — Telephone Encounter (Signed)
Yes.  Could go ahead and get pneumovax.

## 2018-09-25 NOTE — Telephone Encounter (Signed)
Please advise 

## 2018-09-25 NOTE — Telephone Encounter (Signed)
Patient has been scheduled for Monday, 09/28/18 at 1:30 pm for a nurse visit.

## 2018-09-27 MED ORDER — AMPHETAMINE-DEXTROAMPHET ER 30 MG PO CP24: ORAL_CAPSULE | ORAL | 0 refills | Status: DC

## 2018-09-27 NOTE — Telephone Encounter (Signed)
Refilled once.  Will need office follow up before further refills.

## 2018-09-28 ENCOUNTER — Ambulatory Visit (INDEPENDENT_AMBULATORY_CARE_PROVIDER_SITE_OTHER): Payer: BLUE CROSS/BLUE SHIELD | Admitting: *Deleted

## 2018-09-28 ENCOUNTER — Ambulatory Visit: Payer: BLUE CROSS/BLUE SHIELD | Attending: General Surgery | Admitting: Physical Therapy

## 2018-09-28 ENCOUNTER — Encounter: Payer: Self-pay | Admitting: Physical Therapy

## 2018-09-28 ENCOUNTER — Other Ambulatory Visit: Payer: Self-pay

## 2018-09-28 DIAGNOSIS — C50212 Malignant neoplasm of upper-inner quadrant of left female breast: Secondary | ICD-10-CM | POA: Diagnosis not present

## 2018-09-28 DIAGNOSIS — Z23 Encounter for immunization: Secondary | ICD-10-CM | POA: Diagnosis not present

## 2018-09-28 DIAGNOSIS — Z483 Aftercare following surgery for neoplasm: Secondary | ICD-10-CM | POA: Insufficient documentation

## 2018-09-28 DIAGNOSIS — M25612 Stiffness of left shoulder, not elsewhere classified: Secondary | ICD-10-CM | POA: Diagnosis not present

## 2018-09-28 DIAGNOSIS — Z170 Estrogen receptor positive status [ER+]: Secondary | ICD-10-CM | POA: Diagnosis not present

## 2018-09-28 NOTE — Therapy (Signed)
[place], [name], [address] Phone: [phone]   Fax:  [phone]  Physical Therapy Treatment  Patient Details  Name: Tiffany Montes MRN: [id number] Date of Birth: [birth year] Referring Provider (PT): Dr. Fanny Skates   Encounter Date: 09/28/2018  PT End of Session - 09/28/18 1053    Visit Number  2    Number of Visits  2    PT Start Time  1004    PT Stop Time  1052    PT Time Calculation (min)  48 min    Activity Tolerance  Patient tolerated treatment well    Behavior During Therapy  [facility] for tasks assessed/performed       Past Medical History:  Diagnosis Date  . Anxiety   . Attention deficit disorder   . Cancer ([place])    Left breast CA  . Depression   . Gallstones 09/05/2011  . Hyperlipidemia   . Hypertension   . Hypothyroidism   . Migraines   . PONV (postoperative nausea and vomiting)    diffficulty waking up  . Thyroid disease    hypothyroid    Past Surgical History:  Procedure Laterality Date  . ABDOMINAL HYSTERECTOMY  2003  . BREAST BIOPSY Left 2014   benign  . BREAST LUMPECTOMY WITH RADIOACTIVE SEED AND SENTINEL LYMPH NODE BIOPSY Left 09/07/2018   Procedure: LEFT BREAST LUMPECTOMY WITH RADIOACTIVE SEED AND AXILLARY SENTINEL LYMPH NODE BIOPSY,INJECT BLUE DYE LEFT BREAST;  Surgeon: Fanny Skates, MD;  Location: [place];  Service: General;  Laterality: Left;  . CHOLECYSTECTOMY  09/26/2011   Procedure: LAPAROSCOPIC CHOLECYSTECTOMY WITH INTRAOPERATIVE CHOLANGIOGRAM;  Surgeon: Haywood Lasso, MD;  Location: [place];  Service: General;  Laterality: N/A;  . COLONOSCOPY    . CYSTIC HYGROMA EXCISION    . DENTAL SURGERY     skin graft from top of mouth to lower gums  . PORTACATH PLACEMENT N/A 09/07/2018   Procedure: INSERTION PORT-A-CATH WITH [place];  Surgeon: Fanny Skates, MD;  Location: [place];  Service: General;  Laterality: N/A;    There were no vitals filed for this  visit.  Subjective Assessment - 09/28/18 1009    Subjective  She reports she underwent a left lumpectomy and sentinel node biopsy (0/5 nodes positive) on 09/07/18. She will begin chemotherapy on 10/02/18 as she is HER2 positive followed radiation and anti-estrogen therapy.    Pertinent History  Patient was diagnosed on 07/22/18 with left grade III invasive ductal carcinoma breast cancer. She reports she underwent a left lumpectomy and sentinel node biopsy (0/5 nodes positive) on 09/07/18. It is triple positive.    Patient Stated Goals  See if my arm is ok    Currently in Pain?  No/denies         [facility] PT Assessment - 09/28/18 0001      Assessment   Medical Diagnosis  s/p left lumpectomy and SLNB    Referring Provider (PT)  Dr. Fanny Skates    Onset Date/Surgical Date  09/07/18    Hand Dominance  Right    Prior Therapy  Baseline assessment      Precautions   Precautions  Other (comment)    Precaution Comments  recent breast surgery      Restrictions   Weight Bearing Restrictions  No      Balance Screen   Has the patient fallen in the past 6 months  No    Has the patient had a decrease in activity level  because of a fear of falling?   No    Is the patient reluctant to leave their home because of a fear of falling?   No      Home Environment   Living Environment  Private residence    Living Arrangements  Alone    Available Help at Discharge  Family      Prior Function   Level of Independence  Independent    Vocation  Full time employment    Vocation Requirements  on [place]  She is walking daily 30 minutes      Cognition   Overall Cognitive Status  Within Functional Limits for tasks assessed      Observation/Other Assessments   Observations  Incisions appear to be healing well; no redness or warmth noted. Some mild edema present left lateral trunk just inferior to axilla.      Posture/Postural Control   Posture/Postural Control  Postural limitations    Postural  Limitations  Rounded Shoulders;Forward head      ROM / Strength   AROM / PROM / Strength  AROM      AROM   AROM Assessment Site  Shoulder    Right/Left Shoulder  Left    Left Shoulder Extension  36 Degrees    Left Shoulder Flexion  148 Degrees    Left Shoulder ABduction  153 Degrees    Left Shoulder Internal Rotation  60 Degrees    Left Shoulder External Rotation  77 Degrees        LYMPHEDEMA/ONCOLOGY QUESTIONNAIRE - 09/28/18 1018      Type   Cancer Type  Left breast cancer      Surgeries   Lumpectomy Date  09/07/18    Sentinel Lymph Node Biopsy Date  09/07/18    Number Lymph Nodes Removed  5      Treatment   Active Chemotherapy Treatment  No   [place] 10/02/18   Past Chemotherapy Treatment  No    Active Radiation Treatment  No    Past Radiation Treatment  No    Current Hormone Treatment  No    Past Hormone Therapy  No      What other symptoms do you have   Are you Having Heaviness or Tightness  No    Are you having Pain  No    Are you having pitting edema  No    Is it Hard or Difficult finding clothes that fit  No    Do you have infections  No    Is there Decreased scar mobility  Yes    Stemmer Sign  No      Right Upper Extremity Lymphedema   10 cm Proximal to Olecranon Process  32.6 cm    Olecranon Process  25.8 cm    10 cm Proximal to Ulnar Styloid Process  22 cm    Just Proximal to Ulnar Styloid Process  15.7 cm    Across Hand at [employer]  17.7 cm    At [place] of 2nd Digit  6.4 cm      Left Upper Extremity Lymphedema   10 cm Proximal to Olecranon Process  32.7 cm    Olecranon Process  26.5 cm    10 cm Proximal to Ulnar Styloid Process  22.2 cm    Just Proximal to Ulnar Styloid Process  15.4 cm    Across Hand at [employer]  17.2 cm    At [place] of 2nd  Digit  6.1 cm        [name] - 09/28/18 0001    Open a tight or new jar  No difficulty    Do heavy household chores (wash walls, wash floors)  No difficulty    Carry a shopping bag or  briefcase  No difficulty    Wash your back  No difficulty    Use a knife to cut food  No difficulty    Recreational activities in which you take some force or impact through your arm, shoulder, or hand (golf, hammering, tennis)  No difficulty    During the past week, to what extent has your arm, shoulder or hand problem interfered with your normal social activities with family, friends, neighbors, or groups?  Not at all    During the past week, to what extent has your arm, shoulder or hand problem limited your work or other regular daily activities  Not at all    Arm, shoulder, or hand pain.  None    Tingling (pins and needles) in your arm, shoulder, or hand  None    Difficulty Sleeping  Mild difficulty    DASH Score  2.27 %                     PT Education - 09/28/18 1050    Education Details  Lymphedema risk and treatment; precautions    Person(s) Educated  Patient    Methods  Explanation    Comprehension  Verbalized understanding          PT Long Term Goals - 09/28/18 1055      PT LONG TERM GOAL #1   Title  Patient will demonstrate she has regained shoulder ROM and function post operatively compared to baseline.    Time  8    Period  Weeks    Status  Partially Met            Plan - 09/28/18 1053    Clinical Impression Statement  Patient is doing well s/p left lumpectomy and SLNB. Her incisions appear well healed, her shoulder ROM and function appears to be nearly back to baseline, and there is no sign of lymphedema. Bilateral circumferential arm measurements were up slightly but were equal bilaterally. Her left shoulder flexion and abduction were slightly less than baseline but her HEP was reviewed and she plans to work on that. She felt mild edema under her left arm so compression foam in a stockinette was issued to place between lateral bra and skin to address that. There are no other needs for PT at this time but she knows to contact us with any questions  or concerns.     PT Treatment/Interventions  ADLs/Self Care Home Management;Therapeutic exercise;Patient/family education    PT Next Visit Plan  D/C    PT Home Exercise Plan  Post op shoulder ROM HEP    Consulted and Agree with Plan of Care  Patient       Patient will benefit from skilled therapeutic intervention in order to improve the following deficits and impairments:  Postural dysfunction, Impaired UE functional use, Decreased knowledge of precautions, Decreased range of motion  Visit Diagnosis: Malignant neoplasm of upper-inner quadrant of left breast in female, estrogen receptor positive (HCC)  Stiffness of left shoulder, not elsewhere classified  Aftercare following surgery for neoplasm     Problem List Patient Active Problem List   Diagnosis Date Noted  . Malignant neoplasm of upper-inner quadrant of left  breast in female, estrogen receptor positive ([place]) 08/11/2018  . Prediabetes 02/24/2018  . Varicose veins of legs 06/03/2017  . Hyperglycemia 12/04/2015  . Obesity 12/04/2015  . Gallstones 09/05/2011  . WEIGHT GAIN 10/26/2010  . FATIGUE 04/12/2010  . HYPERLIPIDEMIA 01/08/2010  . SINUSITIS, ACUTE 10/16/2009  . ADD 04/14/2009  . DYSHIDROSIS 04/14/2009  . HYPOTHYROIDISM 04/03/2009  . Major depressive disorder, recurrent episode ([place]) 04/03/2009  . Essential hypertension 04/03/2009    [facility] 09/28/2018, 10:56 AM  [place], [name], [address] Phone: [phone]   Fax:  [phone]  Name: LISSETTE SCHENK MRN: [id number] Date of Birth: 1956/05/10  PHYSICAL THERAPY DISCHARGE SUMMARY  Visits from Start of Care: 2  Current functional level related to goals / functional outcomes: Mild deficit with left shoulder flexion and abduction ROM. Mild edema present left lateral trunk just inferior to axilla.   Remaining deficits: See above.   Education / Equipment: HEP and  lymphedema risk reduction.  Plan: Patient agrees to discharge.  Patient goals were partially met. Patient is being discharged due to being pleased with the current functional level.  ?????         Annia Friendly, [place] 09/28/18 10:57 AM'

## 2018-10-01 ENCOUNTER — Encounter: Payer: Self-pay | Admitting: Oncology

## 2018-10-01 NOTE — Progress Notes (Signed)
Contacted pt to introduce myself as her Arboriculturist and to discuss copay assistance.  Requested she return my call at her earliest convenience.  If not, I will meet with her on 10/02/18.

## 2018-10-02 ENCOUNTER — Inpatient Hospital Stay: Payer: BLUE CROSS/BLUE SHIELD

## 2018-10-02 ENCOUNTER — Inpatient Hospital Stay (HOSPITAL_BASED_OUTPATIENT_CLINIC_OR_DEPARTMENT_OTHER): Payer: BLUE CROSS/BLUE SHIELD | Admitting: Adult Health

## 2018-10-02 ENCOUNTER — Telehealth: Payer: Self-pay | Admitting: Oncology

## 2018-10-02 ENCOUNTER — Encounter: Payer: Self-pay | Admitting: Adult Health

## 2018-10-02 ENCOUNTER — Encounter: Payer: Self-pay | Admitting: *Deleted

## 2018-10-02 VITALS — BP 145/70 | HR 86 | Temp 97.8°F | Resp 16 | Ht 62.5 in | Wt 208.3 lb

## 2018-10-02 VITALS — BP 128/72 | HR 79 | Temp 98.6°F | Resp 16

## 2018-10-02 DIAGNOSIS — Z79899 Other long term (current) drug therapy: Secondary | ICD-10-CM | POA: Diagnosis not present

## 2018-10-02 DIAGNOSIS — E039 Hypothyroidism, unspecified: Secondary | ICD-10-CM

## 2018-10-02 DIAGNOSIS — C50312 Malignant neoplasm of lower-inner quadrant of left female breast: Secondary | ICD-10-CM

## 2018-10-02 DIAGNOSIS — Z5111 Encounter for antineoplastic chemotherapy: Secondary | ICD-10-CM | POA: Diagnosis not present

## 2018-10-02 DIAGNOSIS — Z95828 Presence of other vascular implants and grafts: Secondary | ICD-10-CM

## 2018-10-02 DIAGNOSIS — F329 Major depressive disorder, single episode, unspecified: Secondary | ICD-10-CM

## 2018-10-02 DIAGNOSIS — Z170 Estrogen receptor positive status [ER+]: Principal | ICD-10-CM

## 2018-10-02 DIAGNOSIS — C50212 Malignant neoplasm of upper-inner quadrant of left female breast: Secondary | ICD-10-CM

## 2018-10-02 DIAGNOSIS — E785 Hyperlipidemia, unspecified: Secondary | ICD-10-CM

## 2018-10-02 DIAGNOSIS — I10 Essential (primary) hypertension: Secondary | ICD-10-CM

## 2018-10-02 DIAGNOSIS — Z5112 Encounter for antineoplastic immunotherapy: Secondary | ICD-10-CM

## 2018-10-02 DIAGNOSIS — R232 Flushing: Secondary | ICD-10-CM | POA: Diagnosis not present

## 2018-10-02 LAB — CBC WITH DIFFERENTIAL/PLATELET
ABS IMMATURE GRANULOCYTES: 0.03 10*3/uL (ref 0.00–0.07)
BASOS ABS: 0 10*3/uL (ref 0.0–0.1)
Basophils Relative: 1 %
Eosinophils Absolute: 0.4 10*3/uL (ref 0.0–0.5)
Eosinophils Relative: 5 %
HEMATOCRIT: 40 % (ref 36.0–46.0)
Hemoglobin: 13.1 g/dL (ref 12.0–15.0)
Immature Granulocytes: 0 %
Lymphocytes Relative: 30 %
Lymphs Abs: 2.2 10*3/uL (ref 0.7–4.0)
MCH: 28.7 pg (ref 26.0–34.0)
MCHC: 32.8 g/dL (ref 30.0–36.0)
MCV: 87.5 fL (ref 80.0–100.0)
Monocytes Absolute: 0.7 10*3/uL (ref 0.1–1.0)
Monocytes Relative: 9 %
NEUTROS ABS: 4 10*3/uL (ref 1.7–7.7)
Neutrophils Relative %: 55 %
Platelets: 230 10*3/uL (ref 150–400)
RBC: 4.57 MIL/uL (ref 3.87–5.11)
RDW: 12.2 % (ref 11.5–15.5)
WBC: 7.2 10*3/uL (ref 4.0–10.5)
nRBC: 0 % (ref 0.0–0.2)

## 2018-10-02 LAB — COMPREHENSIVE METABOLIC PANEL
ALT: 33 U/L (ref 0–44)
AST: 26 U/L (ref 15–41)
Albumin: 3.7 g/dL (ref 3.5–5.0)
Alkaline Phosphatase: 84 U/L (ref 38–126)
Anion gap: 10 (ref 5–15)
BUN: 25 mg/dL — ABNORMAL HIGH (ref 8–23)
CO2: 24 mmol/L (ref 22–32)
Calcium: 9.6 mg/dL (ref 8.9–10.3)
Chloride: 107 mmol/L (ref 98–111)
Creatinine, Ser: 0.88 mg/dL (ref 0.44–1.00)
GFR calc Af Amer: >60 mL/min (ref >60)
GFR calc non Af Amer: >60 mL/min (ref >60)
Glucose, Bld: 116 mg/dL — ABNORMAL HIGH (ref 70–99)
Potassium: 4.2 mmol/L (ref 3.5–5.1)
SODIUM: 141 mmol/L (ref 135–145)
Total Bilirubin: <0.2 mg/dL — ABNORMAL LOW (ref 0.3–1.2)
Total Protein: 6.9 g/dL (ref 6.5–8.1)

## 2018-10-02 MED ORDER — SODIUM CHLORIDE 0.9 % IV SOLN
Freq: Once | INTRAVENOUS | Status: DC
  Filled 2018-10-02: qty 250

## 2018-10-02 MED ORDER — ONDANSETRON HCL 4 MG/2ML IJ SOLN
INTRAMUSCULAR | Status: AC
  Filled 2018-10-02: qty 2

## 2018-10-02 MED ORDER — ACETAMINOPHEN 325 MG PO TABS
ORAL_TABLET | ORAL | Status: AC
  Filled 2018-10-02: qty 2

## 2018-10-02 MED ORDER — SODIUM CHLORIDE 0.9 % IV SOLN
80.0000 mg/m2 | Freq: Once | INTRAVENOUS | Status: AC
  Administered 2018-10-02: 162 mg via INTRAVENOUS
  Filled 2018-10-02: qty 27

## 2018-10-02 MED ORDER — HEPARIN SOD (PORK) LOCK FLUSH 100 UNIT/ML IV SOLN
500.0000 [IU] | Freq: Once | INTRAVENOUS | Status: AC | PRN
  Administered 2018-10-02: 500 [IU]
  Filled 2018-10-02: qty 5

## 2018-10-02 MED ORDER — FAMOTIDINE IN NACL 20-0.9 MG/50ML-% IV SOLN
INTRAVENOUS | Status: AC
  Filled 2018-10-02: qty 50

## 2018-10-02 MED ORDER — DIPHENHYDRAMINE HCL 50 MG/ML IJ SOLN
25.0000 mg | Freq: Once | INTRAMUSCULAR | Status: AC
  Administered 2018-10-02: 25 mg via INTRAVENOUS

## 2018-10-02 MED ORDER — SODIUM CHLORIDE 0.9 % IV SOLN
Freq: Once | INTRAVENOUS | Status: AC
  Administered 2018-10-02: 10:00:00 via INTRAVENOUS
  Filled 2018-10-02: qty 250

## 2018-10-02 MED ORDER — SODIUM CHLORIDE 0.9% FLUSH
10.0000 mL | INTRAVENOUS | Status: DC | PRN
  Administered 2018-10-02: 10 mL via INTRAVENOUS
  Filled 2018-10-02: qty 10

## 2018-10-02 MED ORDER — ONDANSETRON HCL 4 MG/2ML IJ SOLN
4.0000 mg | Freq: Once | INTRAMUSCULAR | Status: AC
  Administered 2018-10-02: 4 mg via INTRAVENOUS

## 2018-10-02 MED ORDER — SODIUM CHLORIDE 0.9% FLUSH
10.0000 mL | INTRAVENOUS | Status: DC | PRN
  Administered 2018-10-02: 10 mL
  Filled 2018-10-02: qty 10

## 2018-10-02 MED ORDER — FAMOTIDINE IN NACL 20-0.9 MG/50ML-% IV SOLN
20.0000 mg | Freq: Once | INTRAVENOUS | Status: AC
  Administered 2018-10-02: 20 mg via INTRAVENOUS

## 2018-10-02 MED ORDER — ACETAMINOPHEN 325 MG PO TABS
650.0000 mg | ORAL_TABLET | Freq: Once | ORAL | Status: AC
  Administered 2018-10-02: 650 mg via ORAL

## 2018-10-02 MED ORDER — SODIUM CHLORIDE 0.9 % IV SOLN
20.0000 mg | Freq: Once | INTRAVENOUS | Status: AC
  Administered 2018-10-02: 20 mg via INTRAVENOUS
  Filled 2018-10-02: qty 2

## 2018-10-02 MED ORDER — TRASTUZUMAB CHEMO 150 MG IV SOLR
8.0000 mg/kg | Freq: Once | INTRAVENOUS | Status: AC
  Administered 2018-10-02: 735 mg via INTRAVENOUS
  Filled 2018-10-02: qty 35

## 2018-10-02 MED ORDER — DIPHENHYDRAMINE HCL 50 MG/ML IJ SOLN
INTRAMUSCULAR | Status: AC
  Filled 2018-10-02: qty 1

## 2018-10-02 NOTE — Patient Instructions (Signed)

## 2018-10-02 NOTE — Telephone Encounter (Signed)
Faxed medical records to [facility], Release [id number]

## 2018-10-02 NOTE — Patient Instructions (Signed)
[facility] Discharge Instructions for Patients Receiving Chemotherapy  Today you received the following chemotherapy agents :  Herceptin,  Taxol.  To help prevent nausea and vomiting after your treatment, we encourage you to take your nausea medication as prescribed.   If you develop nausea and vomiting that is not controlled by your nausea medication, call the clinic.   BELOW ARE SYMPTOMS THAT SHOULD BE REPORTED IMMEDIATELY:  *FEVER GREATER THAN 100.5 F  *CHILLS WITH OR WITHOUT FEVER  NAUSEA AND VOMITING THAT IS NOT CONTROLLED WITH YOUR NAUSEA MEDICATION  *UNUSUAL SHORTNESS OF BREATH  *UNUSUAL BRUISING OR BLEEDING  TENDERNESS IN MOUTH AND THROAT WITH OR WITHOUT PRESENCE OF ULCERS  *URINARY PROBLEMS  *BOWEL PROBLEMS  UNUSUAL RASH Items with * indicate a potential emergency and should be followed up as soon as possible.  Feel free to call the clinic should you have any questions or concerns. The clinic phone number is [phone].  Please show the CHEMO ALERT CARD at check-in to the Emergency Department and triage nurse.   

## 2018-10-02 NOTE — Progress Notes (Signed)
[name]  [phone] Fax:([phone]) [phone]     ID: [id number] PEDERSON DOB: 11/19/1955  MR#: [id number]  WSF#:[id number]  Patient Care Team: Eulas Post, MD as PCP - General Fanny Skates, MD as Consulting Physician (General Surgery) Magrinat, Virgie Dad, MD as Consulting Physician (Oncology) Kyung Rudd, MD as Consulting Physician (Radiation Oncology) Maisie Fus, MD as Consulting Physician (Obstetrics and Gynecology) Larey Dresser, MD as Consulting Physician (Cardiology) OTHER MD:   CHIEF COMPLAINT: Estrogen receptor positive breast cancer, HER-2 amplified  CURRENT TREATMENT: Adjuvant chemotherapy   HISTORY OF CURRENT ILLNESS: From the original intake note:  "Tiffany Montes" had routine screening mammography on 07/22/2018 showing a possible abnormality in the left breast. She underwent unilateral diagnostic mammography with tomography and left breast ultrasonography at [facility] on 07/28/2018 showing: Breast density Category C; a 7 x 8 x 8 mm hypoechoic mass in the left breast lower inner quadrant, consistent with a complex cyst.. No abnormal lymph nodes in the left axilla.  Accordingly on 07/31/2018 she proceeded to biopsy of the left breast area in question. The pathology from this procedure showed ([id number]): Invasive Ductal Carcinoma, Grade 3. Prognostic indicators significant for: estrogen receptor, 95% positive and progesterone receptor, 85% positive, both with strong staining intensity. Proliferation marker Ki67 at 75%. HER2 positive by immunohistochemistry, 3+.   The patient's subsequent history is as detailed below.  INTERVAL HISTORY: "Tiffany Montes" is here for her follow up  and treatment of her estrogen receptor positive breast cancer, HER-2 amplified visit, she is accompanied by daughter Tiffany Montes.    Tiffany Montes is doing well today.  She has her anti nausea medications. 62  She is ready to proceed with her first cycle of adjuvant chemo immunotherapy  with weekly Paclitaxel and every 3 week Trastuzumab.  Today is week 1.     REVIEW OF SYSTEMS: Tiffany Montes is feeling moderately well.  She notes feeling tired and having a difficult time sleeping since stopping the estrogen replacement therapy she was taking a couple of months ago.  She underwent an echocardiogram in late November that showed an EF of 60-65%.  She denies any headaches or vision changes.  She is without any cough, chest pain, palpitations, or shortness of breath.  She hasn't noted any abdominal issues such as bowel/bladder changes, pain, nasuea or vomiting.  A detailed ROS was conducted and was otherwise non contributory.     PAST MEDICAL HISTORY: Past Medical History:  Diagnosis Date  . Anxiety   . Attention deficit disorder   . Cancer ([place])    Left breast CA  . Depression   . Gallstones 09/05/2011  . Hyperlipidemia   . Hypertension   . Hypothyroidism   . Migraines   . PONV (postoperative nausea and vomiting)    diffficulty waking up  . Thyroid disease    hypothyroid    PAST SURGICAL HISTORY: Past Surgical History:  Procedure Laterality Date  . ABDOMINAL HYSTERECTOMY  2003  . BREAST BIOPSY Left 2014   benign  . BREAST LUMPECTOMY WITH RADIOACTIVE SEED AND SENTINEL LYMPH NODE BIOPSY Left 09/07/2018   Procedure: LEFT BREAST LUMPECTOMY WITH RADIOACTIVE SEED AND AXILLARY SENTINEL LYMPH NODE BIOPSY,INJECT BLUE DYE LEFT BREAST;  Surgeon: Fanny Skates, MD;  Location: [facility];  Service: General;  Laterality: Left;  . CHOLECYSTECTOMY  09/26/2011   Procedure: LAPAROSCOPIC CHOLECYSTECTOMY WITH INTRAOPERATIVE CHOLANGIOGRAM;  Surgeon: Haywood Lasso, MD;  Location: [facility];  Service: General;  Laterality: N/A;  . COLONOSCOPY    . CYSTIC  HYGROMA EXCISION    . DENTAL SURGERY     skin graft from top of mouth to lower gums  . PORTACATH PLACEMENT N/A 09/07/2018   Procedure: INSERTION PORT-A-CATH WITH [place];  Surgeon: Fanny Skates, MD;  [facility];  Service: General;   Laterality: N/A;    FAMILY HISTORY Family History  Problem Relation Age of Onset  . Diabetes Father   . Cancer Father        lung   . Heart disease Father    She notes that her father died from CHF at age 22.  He was diagnosed with lung cancer at age 40. Patients' mother is 63 years old as of November 2019. The patient has 1 brother and 2 sisters. Patient denies a family history of ovarian or breast cancer.  GYNECOLOGIC HISTORY:  No LMP recorded. Patient has had a hysterectomy. 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 Job is very stressful and schedule is  She is divorced and lives by herself with her 2 dogs. Her daughter [name] is a Dealer in [place], [name].  The patient's son, [name] is a Training and development officer and lives in [place]. The patient has 2 grandchildren and 1 great-grand child.  She is not a church or tender     ADVANCED DIRECTIVES: Her [place] is her daughter, [name], [phone].     HEALTH MAINTENANCE: Social History   Tobacco Use  . Smoking status: Never Smoker  . Smokeless tobacco: Never Used  Substance Use Topics  . Alcohol use: Yes    Comment: 1-2 a week and reports not every week  . Drug use: No     Colonoscopy: January 2013  PAP: 1 month ago  Bone density: Yes, 2 years ago   Allergies  Allergen Reactions  . Morphine Sulfate Nausea And Vomiting and Rash    GI upset    Current Outpatient Medications  Medication Sig Dispense Refill  . amphetamine-dextroamphetamine (ADDERALL XR) 30 MG 24 hr capsule Take one tablet by mouth daily. 30 capsule 0  . augmented betamethasone dipropionate (DIPROLENE-AF) 0.05 % cream APPLY TO AFFECTED AREA TWICE A DAY (Patient taking  differently: Apply 1 application topically 2 (two) times daily as needed (for eczema). ) 30 g 2  . benzonatate (TESSALON) 200 MG capsule TAKE 1 CAPSULE AS NEEDED FOR ALLERGIES(COUGH) (Patient taking differently: Take 200 mg by mouth daily as needed (allergies/cough). ) 60 capsule 0  . BIOTIN PO Take 1 tablet by mouth daily.    Marland Kitchen buPROPion (WELLBUTRIN XL) 300 MG 24 hr tablet TAKE 1 TABLET BY MOUTH EVERY DAY (Patient taking differently: Take 300 mg by mouth daily. ) 90 tablet 0  . Cholecalciferol (VITAMIN D3 PO) Take 1 tablet by mouth daily.    . clonazePAM (KLONOPIN) 0.5 MG tablet TAKE 1 TABLET BY MOUTH AT BEDTIME AS NEEDED (Patient taking differently: Take 0.5 mg by mouth daily as needed (anxiety/panic attacks.). ) 30 tablet 5  . fluticasone (FLONASE) 50 MCG/ACT nasal spray SPRAY 2 SPRAYS INTO EACH NOSTRIL EVERY DAY (Patient taking differently: Place 2 sprays into both nostrils daily as needed for allergies. ) 16 g 2  . levothyroxine (SYNTHROID, LEVOTHROID) 125 MCG tablet TAKE 1 TABLET BY MOUTH EVERY  DAY (Patient taking differently: Take 125 mcg by mouth daily before breakfast. ) 90 tablet 1  . lidocaine-prilocaine (EMLA) cream Apply to affected area once (Patient taking differently: Apply 1 application topically daily as needed (prior to port is accessed.). ) 30 g 3  . losartan-hydrochlorothiazide (HYZAAR) 50-12.5 MG tablet TAKE 1 TABLET BY MOUTH EVERY DAY (Patient taking differently: Take 1 tablet by mouth daily. ) 90 tablet 1  . Multiple Vitamins-Minerals (MULTIVITAMINS THER. W/MINERALS) TABS Take 1 tablet by mouth daily.      . prochlorperazine (COMPAZINE) 10 MG tablet Take 1 tablet (10 mg total) by mouth every 6 (six) hours as needed (Nausea or vomiting). 30 tablet 1  . sertraline (ZOLOFT) 50 MG tablet TAKE 1 TABLET BY MOUTH EVERY DAY (Patient taking differently: Take 50 mg by mouth daily. ) 90 tablet 2  . SUMAtriptan (IMITREX) 100 MG tablet TAKE 1 TABLET EVERY DAY AS NEEDED FOR MIGRAINE, MAY  REPEAT IN 24 HOURS (Patient taking differently: Take 100 mg by mouth every 2 (two) hours as needed for migraine. ) 9 tablet 2   Current Facility-Administered Medications  Medication Dose Route Frequency Provider Last Rate Last Dose  . sodium chloride flush (NS) 0.9 % injection 10 mL  10 mL Intravenous PRN Magrinat, Virgie Dad, MD   10 mL at 10/02/18 0925    OBJECTIVE:  Vitals:   10/02/18 0914  BP: (!) 145/70  Pulse: 86  Resp: 16  Temp: 97.8 F (36.6 C)  SpO2: 99%     Body mass index is 37.49 kg/m.   Wt Readings from Last 3 Encounters:  10/02/18 208 lb 4.8 oz (94.5 kg)  09/18/18 204 lb 8 oz (92.8 kg)  09/07/18 202 lb 3.2 oz (91.7 kg)  ECOG FS:1 - Symptomatic but completely ambulatory GENERAL: Patient is a well appearing female in no acute distress HEENT:  Sclerae anicteric.  Oropharynx clear and moist. No ulcerations or evidence of oropharyngeal candidiasis. Neck is supple.  NODES:  No cervical, supraclavicular, or axillary lymphadenopathy palpated.  BREAST EXAM:  Deferred. LUNGS:  Clear to auscultation bilaterally.  No wheezes or rhonchi. HEART:  Regular rate and rhythm. No murmur appreciated. ABDOMEN:  Soft, nontender.  Positive, normoactive bowel sounds. No organomegaly palpated. MSK:  No focal spinal tenderness to palpation. Full range of motion bilaterally in the upper extremities. EXTREMITIES:  No peripheral edema.   SKIN:  Clear with no obvious rashes or skin changes. No nail dyscrasia. NEURO:  Nonfocal. Well oriented.  Appropriate affect.    LAB RESULTS:  CMP     Component Value Date/Time   NA 141 09/18/2018 0833   K 3.5 09/18/2018 0833   CL 104 09/18/2018 0833   CO2 25 09/18/2018 0833   GLUCOSE 128 (H) 09/18/2018 0833   BUN 24 (H) 09/18/2018 0833   CREATININE 0.88 09/18/2018 0833   CREATININE 0.89 08/12/2018 1222   CALCIUM 9.2 09/18/2018 0833   PROT 6.9 09/18/2018 0833   ALBUMIN 3.7 09/18/2018 0833   AST 18 09/18/2018 0833   AST 14 (L) 08/12/2018 1222    ALT 17 09/18/2018 0833   ALT 14 08/12/2018 1222   ALKPHOS 78 09/18/2018 0833   BILITOT 0.3 09/18/2018 0833   BILITOT 0.2 (L) 08/12/2018 1222   GFRNONAA >60 09/18/2018 0833   GFRNONAA >60 08/12/2018 1222   GFRAA >60 09/18/2018 0833   GFRAA >60 08/12/2018 1222    No results found for: TOTALPROTELP, ALBUMINELP, A1GS, A2GS, BETS, BETA2SER, GAMS, MSPIKE, SPEI  No results found  for: KPAFRELGTCHN, LAMBDASER, [facility]  Lab Results  Component Value Date   WBC 7.2 10/02/2018   NEUTROABS 4.0 10/02/2018   HGB 13.1 10/02/2018   HCT 40.0 10/02/2018   MCV 87.5 10/02/2018   PLT 230 10/02/2018    @LASTCHEMISTRY @  No results found for: LABCA2  No components found for: [id number]  No results for input(s): INR in the last 168 hours.  No results found for: LABCA2  No results found for: JSH702  No results found for: OVZ858  No results found for: IFO277  No results found for: CA2729  No components found for: HGQUANT  No results found for: CEA1 / No results found for: CEA1   No results found for: AFPTUMOR  No results found for: CHROMOGRNA  No results found for: PSA1  Appointment on 10/02/2018  Component Date Value Ref Range Status  . WBC 10/02/2018 7.2  4.0 - 10.5 K/uL Final  . RBC 10/02/2018 4.57  3.87 - 5.11 MIL/uL Final  . Hemoglobin 10/02/2018 13.1  12.0 - 15.0 g/dL Final  . HCT 10/02/2018 40.0  36.0 - 46.0 % Final  . MCV 10/02/2018 87.5  80.0 - 100.0 fL Final  . MCH 10/02/2018 28.7  26.0 - 34.0 pg Final  . MCHC 10/02/2018 32.8  30.0 - 36.0 g/dL Final  . RDW 10/02/2018 12.2  11.5 - 15.5 % Final  . Platelets 10/02/2018 230  150 - 400 K/uL Final  . nRBC 10/02/2018 0.0  0.0 - 0.2 % Final  . Neutrophils Relative % 10/02/2018 55  % Final  . Neutro Abs 10/02/2018 4.0  1.7 - 7.7 K/uL Final  . Lymphocytes Relative 10/02/2018 30  % Final  . Lymphs Abs 10/02/2018 2.2  0.7 - 4.0 K/uL Final  . Monocytes Relative 10/02/2018 9  % Final  . Monocytes Absolute 10/02/2018 0.7  0.1  - 1.0 K/uL Final  . Eosinophils Relative 10/02/2018 5  % Final  . Eosinophils Absolute 10/02/2018 0.4  0.0 - 0.5 K/uL Final  . Basophils Relative 10/02/2018 1  % Final  . Basophils Absolute 10/02/2018 0.0  0.0 - 0.1 K/uL Final  . Immature Granulocytes 10/02/2018 0  % Final  . Abs Immature Granulocytes 10/02/2018 0.03  0.00 - 0.07 K/uL Final   Performed at [facility], [address] [place], [place] [address]    (this displays the last labs from the last 3 days)  No results found for: TOTALPROTELP, ALBUMINELP, A1GS, A2GS, BETS, BETA2SER, GAMS, MSPIKE, SPEI (this displays SPEP labs)  No results found for: KPAFRELGTCHN, LAMBDASER, KAPLAMBRATIO (kappa/lambda light chains)  No results found for: HGBA, HGBA2QUANT, HGBFQUANT, HGBSQUAN (Hemoglobinopathy evaluation)   No results found for: LDH  No results found for: IRON, TIBC, IRONPCTSAT (Iron and TIBC)  No results found for: FERRITIN  Urinalysis    Component Value Date/Time   COLORURINE YELLOW 09/13/2011 [place] 09/13/2011 0952   LABSPEC 1.010 09/13/2011 0952   PHURINE 6.5 09/13/2011 0952   GLUCOSEU NEGATIVE 09/13/2011 [place] 09/13/2011 0952   HGBUR negative 04/03/2009 0949   BILIRUBINUR n 09/15/2012 0832   KETONESUR NEGATIVE 09/13/2011 0952   PROTEINUR n 09/15/2012 0832   PROTEINUR NEGATIVE 09/13/2011 0952   UROBILINOGEN 0.2 09/15/2012 0832   UROBILINOGEN 0.2 09/13/2011 0952   NITRITE n 09/15/2012 0832   NITRITE NEGATIVE 09/13/2011 0952   LEUKOCYTESUR Negative 09/15/2012 0832     STUDIES:  Nm Sentinel Node Inj-no Rpt (breast)  Result Date: 09/07/2018 Sulfur colloid  was injected by the nuclear medicine technologist for melanoma sentinel node.   Mm Breast Surgical Specimen  Result Date: 09/07/2018 CLINICAL DATA:  Left lumpectomy for invasive ductal carcinoma. EXAM: SPECIMEN RADIOGRAPH OF THE LEFT BREAST COMPARISON:  Previous exam(s). FINDINGS: Status post  excision of the left breast. The radioactive seed and ribbon shaped biopsy marker clip are present, completely intact, and were marked for pathology. IMPRESSION: Specimen radiograph of the left breast. Electronically Signed   By: Claudie Revering M.D.   On: 09/07/2018 15:24   Dg Chest Port 1 View  Result Date: 09/07/2018 CLINICAL DATA:  Status post port placement EXAM: PORTABLE CHEST 1 VIEW COMPARISON:  05/09/2015 FINDINGS: Right-sided chest wall port is noted with the catheter tip in the mid right atrium. No pneumothorax is seen. No focal infiltrate is seen. Postsurgical changes in the left breast are now noted. No acute bony abnormality is seen. IMPRESSION: No pneumothorax following port placement. Electronically Signed   By: Inez Catalina M.D.   On: 09/07/2018 16:06   Dg Fluoro Guide Cv Line-no Report  Result Date: 09/07/2018 Fluoroscopy was utilized by the requesting physician.  No radiographic interpretation.   Mm Lt Radioactive Seed Loc Mammo Guide  Result Date: 09/07/2018 CLINICAL DATA:  The patient presents for needle localization of a ribbon shaped clip in the [place] of the LEFT breast following ultrasound-guided biopsy which show grade 3 invasive ductal carcinoma in the 9:30 o'clock location. EXAM: MAMMOGRAPHIC GUIDED RADIOACTIVE SEED LOCALIZATION OF THE LEFT BREAST COMPARISON:  Previous exam(s). FINDINGS: Patient presents for radioactive seed localization prior to lumpectomy. I met with the patient and we discussed the procedure of seed localization including benefits and alternatives. We discussed the high likelihood of a successful procedure. We discussed the risks of the procedure including infection, bleeding, tissue injury and further surgery. We discussed the low dose of radioactivity involved in the procedure. Informed, written consent was given. The usual time-out protocol was performed immediately prior to the procedure. Using mammographic guidance, sterile technique, 1%  lidocaine and an I-125 radioactive seed, the ribbon shaped clip in the [place] of the LEFT breast was localized using a MEDIAL to LATERAL approach. The follow-up mammogram images confirm the seed in the expected location and were marked for Dr. Dalbert Batman. Follow-up survey of the patient confirms presence of the radioactive seed. Order number of I-125 seed:  [id number]. Total activity:  0.258 millicuries.  Reference Date: 08/17/2018 The patient tolerated the procedure well and was released from the [place]. She was given instructions regarding seed removal. IMPRESSION: Radioactive seed localization left breast. No apparent complications. Electronically Signed   By: Nolon Nations M.D.   On: 09/07/2018 11:14    ELIGIBLE FOR AVAILABLE RESEARCH PROTOCOL: no  ASSESSMENT: 62 y.o. [place] woman status post left breast lower inner quadrant biopsy 07/31/2018 for a clinical T1b N0, stage IA invasive ductal carcinoma, grade 3, triple positive, with an MIB-1 of 75%.  (1) status post left lumpectomy and sentinel lymph node sampling 09/07/2018 for a pT1c pN0, stage IA invasive ductal carcinoma, grade 3, with negative margins.  (a) a total of 5 sentinel lymph nodes were removed  (2) adjuvant chemotherapy will consist of paclitaxel weekly x12 with trastuzumab every 21 days starting 10/02/2018  (3) trastuzumab to be continued to complete 1 year  (a) echocardiogram 08/17/2018 shows an ejection fraction of 60-65%  (4) adjuvant radiation as appropriate  (5) antiestrogens to start at the completion of local treatment  PLAN:  Tiffany Montes  is doing well today.  Her CBC is stable and she will proceed with her first cycle of weekly Paclitaxel and every 3 week Trastuzumab.  We reviewed common adverse effects, and we reviewed again how to take her anti nausea medications.    [name] and I reviewed the potential for peripheral neuropathy and icing to help prevent it.  She did not bring bags to ice today, but  plans to bring some stuff next week to do this.    Tiffany Montes will return next week for labs, f/u with Dr. Jana Hakim, and her next treatment.  She and her daughter know to call for any other issues that may develop before then.  A total of (20) minutes of face-to-face time was spent with this patient with greater than 50% of that time in counseling and care-coordination.   Wilber Bihari, NP  10/02/18 9:52 AM Medical Oncology and Hematology [facility] [address] [place], [place] [address] Tel. [phone]    Fax. [phone]

## 2018-10-05 ENCOUNTER — Telehealth: Payer: Self-pay | Admitting: Adult Health

## 2018-10-05 NOTE — Telephone Encounter (Signed)
Per 12/27 no los 

## 2018-10-06 NOTE — Progress Notes (Signed)
[place]  [phone] [phone]) [phone]     ID: Tiffany Montes DOB: [birth year]  MR#: [id number]  UEK#:[id number]  Patient Care Team: Eulas Post, MD as PCP - General Fanny Skates, MD as Consulting Physician (General Surgery) Shruthi Northrup, Virgie Dad, MD as Consulting Physician (Oncology) Kyung Rudd, MD as Consulting Physician (Radiation Oncology) Maisie Fus, MD as Consulting Physician (Obstetrics and Gynecology) Larey Dresser, MD as Consulting Physician (Cardiology) OTHER MD:   CHIEF COMPLAINT: Estrogen receptor positive breast cancer, HER-2 amplified  CURRENT TREATMENT: Adjuvant paclitaxel/ trastuzumab   HISTORY OF CURRENT ILLNESS: From the original intake note:  "Tiffany Montes" had routine screening mammography on 07/22/2018 showing a possible abnormality in the left breast. She underwent unilateral diagnostic mammography with tomography and left breast ultrasonography at [facility] on 07/28/2018 showing: Breast density Category C; a 7 x 8 x 8 mm hypoechoic mass in the left breast lower inner quadrant, consistent with a complex cyst.. No abnormal lymph nodes in the left axilla.  Accordingly on 07/31/2018 she proceeded to biopsy of the left breast area in question. The pathology from this procedure showed ([id number]): Invasive Ductal Carcinoma, Grade 3. Prognostic indicators significant for: estrogen receptor, 95% positive and progesterone receptor, 85% positive, both with strong staining intensity. Proliferation marker Ki67 at 75%. HER2 positive by immunohistochemistry, 3+.   The patient's subsequent history is as detailed below.  INTERVAL HISTORY: Tiffany Montes returns today for follow-up of her estrogen receptor positive breast cancer.   Tiffany Montes continues on adjuvant chemotherapy, consisting of paclitaxel weekly x12 with trastuzumab every 21 days. Today is day 8 cycle 1--she will receive paclitaxel alone today and next week, and then repeat the  trastuzumab with paclitaxel. She said she had some myalgia in her legs. She was nauseous in the morning, which she describes as "like morning sickness when you're pregnant," but it resolves once she eats.  Since her last visit here on 10/02/2018, she has not undergone any studies.    REVIEW OF SYSTEMS: Tiffany Montes  has noticed that she is placing things in places that they wouldn't normally go. For example, she put her dogs collar in the trash and the flashlight in the freezer. She has some issue sleeping recently; she is getting about 5 hours of sleep a day. She is having hot flashes, which can occasionally wake her up sometimes in the night. For exercise, she walks her dogs. The patient denies unusual headaches, visual changes, vomiting, or dizziness. There has been no unusual cough, phlegm production, or pleurisy. This been no change in bowel or bladder habits. The patient denies unexplained weight loss, bleeding, rash, or fever. A detailed review of systems was otherwise noncontributory.    PAST MEDICAL HISTORY: Past Medical History:  Diagnosis Date  . Anxiety   . Attention deficit disorder   . Cancer ([place])    Left breast CA  . Depression   . Gallstones 09/05/2011  . Hyperlipidemia   . Hypertension   . Hypothyroidism   . Migraines   . PONV (postoperative nausea and vomiting)    diffficulty waking up  . Thyroid disease    hypothyroid    PAST SURGICAL HISTORY: Past Surgical History:  Procedure Laterality Date  . ABDOMINAL HYSTERECTOMY  2003  . BREAST BIOPSY Left 2014   benign  . BREAST LUMPECTOMY WITH RADIOACTIVE SEED AND SENTINEL LYMPH NODE BIOPSY Left 09/07/2018   Procedure: LEFT BREAST LUMPECTOMY WITH RADIOACTIVE SEED AND AXILLARY SENTINEL LYMPH NODE BIOPSY,INJECT BLUE DYE LEFT BREAST;  Surgeon: Fanny Skates, MD;  Location: [place] [facility];  Service: General;  Laterality: Left;  . CHOLECYSTECTOMY  09/26/2011   Procedure: LAPAROSCOPIC CHOLECYSTECTOMY WITH INTRAOPERATIVE CHOLANGIOGRAM;   Surgeon: Haywood Lasso, MD;  Location: [place];  Service: General;  Laterality: N/A;  . COLONOSCOPY    . CYSTIC HYGROMA EXCISION    . DENTAL SURGERY     skin graft from top of mouth to lower gums  . PORTACATH PLACEMENT N/A 09/07/2018   Procedure: INSERTION PORT-A-CATH WITH [place];  Surgeon: Fanny Skates, MD;  Location: [facility] OR;  Service: General;  Laterality: N/A;    FAMILY HISTORY Family History  Problem Relation Age of Onset  . Diabetes Father   . Cancer Father        lung   . Heart disease Father    She notes that her father died from CHF at age 87.  He was diagnosed with lung cancer at age 4. Patients' mother is 24 years old as of November 2019. The patient has 1 brother and 2 sisters. Patient denies a family history of ovarian or breast cancer.   GYNECOLOGIC HISTORY:  No LMP recorded. Patient has had a hysterectomy. 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 Job is very stressful and schedule is  She is divorced and lives by herself with her 2 dogs. Her daughter Tiffany Montes is a Dealer in [place], [name].  The patient's son, Tiffany Montes is a Training and development officer and lives in [place]. The patient has 2 grandchildren and 1 great-grand child.  She is not a church or tender     ADVANCED DIRECTIVES: Her [place] is her daughter, Tiffany Montes, [phone].     HEALTH MAINTENANCE: Social History   Tobacco Use  . Smoking status: Never Smoker  . Smokeless tobacco: Never Used  Substance Use Topics  . Alcohol use: Yes    Comment: 1-2 a week and reports not every week  . Drug use: No     Colonoscopy: January 2013  PAP: 1 month ago  Bone density: Yes, 2 years ago   Allergies  Allergen Reactions  . Morphine  Sulfate Nausea And Vomiting and Rash    GI upset    Current Outpatient Medications  Medication Sig Dispense Refill  . amphetamine-dextroamphetamine (ADDERALL XR) 30 MG 24 hr capsule Take one tablet by mouth daily. 30 capsule 0  . augmented betamethasone dipropionate (DIPROLENE-AF) 0.05 % cream APPLY TO AFFECTED AREA TWICE A DAY (Patient taking differently: Apply 1 application topically 2 (two) times daily as needed (for eczema). ) 30 g 2  . benzonatate (TESSALON) 200 MG capsule TAKE 1 CAPSULE AS NEEDED FOR ALLERGIES(COUGH) (Patient taking differently: Take 200 mg by mouth daily as needed (allergies/cough). ) 60 capsule 0  . BIOTIN PO Take 1 tablet by mouth daily.    Marland Kitchen buPROPion (WELLBUTRIN XL) 300 MG 24 hr tablet TAKE 1 TABLET BY MOUTH EVERY DAY (Patient taking differently: Take 300 mg by mouth daily. ) 90 tablet 0  . Cholecalciferol (VITAMIN D3 PO) Take 1 tablet by mouth daily.    . clonazePAM (KLONOPIN) 0.5 MG tablet TAKE 1 TABLET BY MOUTH AT BEDTIME AS NEEDED (Patient taking differently: Take 0.5 mg by mouth daily as needed (anxiety/panic attacks.). )  30 tablet 5  . fluticasone (FLONASE) 50 MCG/ACT nasal spray SPRAY 2 SPRAYS INTO EACH NOSTRIL EVERY DAY (Patient taking differently: Place 2 sprays into both nostrils daily as needed for allergies. ) 16 g 2  . gabapentin (NEURONTIN) 300 MG capsule Take 1 capsule (300 mg total) by mouth at bedtime. 90 capsule 4  . levothyroxine (SYNTHROID, LEVOTHROID) 125 MCG tablet TAKE 1 TABLET BY MOUTH EVERY DAY (Patient taking differently: Take 125 mcg by mouth daily before breakfast. ) 90 tablet 1  . lidocaine-prilocaine (EMLA) cream Apply to affected area once (Patient taking differently: Apply 1 application topically daily as needed (prior to port is accessed.). ) 30 g 3  . losartan-hydrochlorothiazide (HYZAAR) 50-12.5 MG tablet TAKE 1 TABLET BY MOUTH EVERY DAY (Patient taking differently: Take 1 tablet by mouth daily. ) 90 tablet 1  . Multiple  Vitamins-Minerals (MULTIVITAMINS THER. W/MINERALS) TABS Take 1 tablet by mouth daily.      . prochlorperazine (COMPAZINE) 10 MG tablet Take 1 tablet (10 mg total) by mouth every 6 (six) hours as needed (Nausea or vomiting). 30 tablet 1  . sertraline (ZOLOFT) 50 MG tablet TAKE 1 TABLET BY MOUTH EVERY DAY (Patient taking differently: Take 50 mg by mouth daily. ) 90 tablet 2  . SUMAtriptan (IMITREX) 100 MG tablet TAKE 1 TABLET EVERY DAY AS NEEDED FOR MIGRAINE, MAY REPEAT IN 24 HOURS (Patient taking differently: Take 100 mg by mouth every 2 (two) hours as needed for migraine. ) 9 tablet 2   No current facility-administered medications for this visit.     OBJECTIVE: Middle-aged white woman in no acute distress  Vitals:   10/09/18 1133  BP: (!) 156/88  Pulse: 77  Resp: 18  Temp: 98.2 F (36.8 C)  SpO2: 99%     Body mass index is 37.08 kg/m.   Wt Readings from Last 3 Encounters:  10/09/18 206 lb (93.4 kg)  10/02/18 208 lb 4.8 oz (94.5 kg)  09/18/18 204 lb 8 oz (92.8 kg)  ECOG FS:1 - Symptomatic but completely ambulatory  Sclerae unicteric, EOMs intact Oropharynx clear and moist No cervical or supraclavicular adenopathy Lungs no rales or rhonchi Heart regular rate and rhythm Abd soft, nontender, positive bowel sounds MSK no focal spinal tenderness, no upper extremity lymphedema Neuro: nonfocal, well oriented, appropriate affect Breasts: The right breast is unremarkable.  The left breast is status post recent lumpectomy.  The incisions have healed nicely.  There is no evidence of residual or recurrent disease.  Both axillae are benign.    LAB RESULTS:  CMP     Component Value Date/Time   NA 141 10/02/2018 0925   K 4.2 10/02/2018 0925   CL 107 10/02/2018 0925   CO2 24 10/02/2018 0925   GLUCOSE 116 (H) 10/02/2018 0925   BUN 25 (H) 10/02/2018 0925   CREATININE 0.88 10/02/2018 0925   CREATININE 0.89 08/12/2018 1222   CALCIUM 9.6 10/02/2018 0925   PROT 6.9 10/02/2018 0925    ALBUMIN 3.7 10/02/2018 0925   AST 26 10/02/2018 0925   AST 14 (L) 08/12/2018 1222   ALT 33 10/02/2018 0925   ALT 14 08/12/2018 1222   ALKPHOS 84 10/02/2018 0925   BILITOT <0.2 (L) 10/02/2018 0925   BILITOT 0.2 (L) 08/12/2018 1222   GFRNONAA >60 10/02/2018 0925   GFRNONAA >60 08/12/2018 1222   GFRAA >60 10/02/2018 0925   GFRAA >60 08/12/2018 1222    No results found for: TOTALPROTELP, ALBUMINELP, A1GS, A2GS, BETS, BETA2SER, GAMS, MSPIKE, SPEI  No results found for: Nils Pyle, [facility]  Lab Results  Component Value Date   WBC 6.4 10/09/2018   NEUTROABS 3.8 10/09/2018   HGB 12.6 10/09/2018   HCT 38.7 10/09/2018   MCV 87.4 10/09/2018   PLT 252 10/09/2018    @LASTCHEMISTRY @  No results found for: LABCA2  No components found for: [id number]  No results for input(s): INR in the last 168 hours.  No results found for: LABCA2  No results found for: UQJ335  No results found for: KTG256  No results found for: LSL373  No results found for: CA2729  No components found for: HGQUANT  No results found for: CEA1 / No results found for: CEA1   No results found for: AFPTUMOR  No results found for: CHROMOGRNA  No results found for: PSA1  Appointment on 10/09/2018  Component Date Value Ref Range Status  . WBC 10/09/2018 6.4  4.0 - 10.5 K/uL Final  . RBC 10/09/2018 4.43  3.87 - 5.11 MIL/uL Final  . Hemoglobin 10/09/2018 12.6  12.0 - 15.0 g/dL Final  . HCT 10/09/2018 38.7  36.0 - 46.0 % Final  . MCV 10/09/2018 87.4  80.0 - 100.0 fL Final  . MCH 10/09/2018 28.4  26.0 - 34.0 pg Final  . MCHC 10/09/2018 32.6  30.0 - 36.0 g/dL Final  . RDW 10/09/2018 12.0  11.5 - 15.5 % Final  . Platelets 10/09/2018 252  150 - 400 K/uL Final  . nRBC 10/09/2018 0.0  0.0 - 0.2 % Final  . Neutrophils Relative % 10/09/2018 60  % Final  . Neutro Abs 10/09/2018 3.8  1.7 - 7.7 K/uL Final  . Lymphocytes Relative 10/09/2018 29  % Final  . Lymphs Abs 10/09/2018 1.9  0.7 - 4.0 K/uL  Final  . Monocytes Relative 10/09/2018 6  % Final  . Monocytes Absolute 10/09/2018 0.4  0.1 - 1.0 K/uL Final  . Eosinophils Relative 10/09/2018 4  % Final  . Eosinophils Absolute 10/09/2018 0.3  0.0 - 0.5 K/uL Final  . Basophils Relative 10/09/2018 0  % Final  . Basophils Absolute 10/09/2018 0.0  0.0 - 0.1 K/uL Final  . Immature Granulocytes 10/09/2018 1  % Final  . Abs Immature Granulocytes 10/09/2018 0.06  0.00 - 0.07 K/uL Final   Performed at [facility], [address] [place], [place] [address]    (this displays the last labs from the last 3 days)  No results found for: TOTALPROTELP, ALBUMINELP, A1GS, A2GS, BETS, BETA2SER, GAMS, MSPIKE, SPEI (this displays SPEP labs)  No results found for: KPAFRELGTCHN, LAMBDASER, KAPLAMBRATIO (kappa/lambda light chains)  No results found for: HGBA, HGBA2QUANT, HGBFQUANT, HGBSQUAN (Hemoglobinopathy evaluation)   No results found for: LDH  No results found for: IRON, TIBC, IRONPCTSAT (Iron and TIBC)  No results found for: FERRITIN  Urinalysis    Component Value Date/Time   COLORURINE YELLOW 09/13/2011 [place] 09/13/2011 0952   LABSPEC 1.010 09/13/2011 0952   PHURINE 6.5 09/13/2011 [place] 09/13/2011 [place] 09/13/2011 0952   HGBUR negative 04/03/2009 0949   BILIRUBINUR n 09/15/2012 [place] 09/13/2011 0952   PROTEINUR n 09/15/2012 0832   PROTEINUR NEGATIVE 09/13/2011 0952   UROBILINOGEN 0.2 09/15/2012 0832   UROBILINOGEN 0.2 09/13/2011 0952   NITRITE n 09/15/2012 0832   NITRITE NEGATIVE 09/13/2011 0952   LEUKOCYTESUR Negative 09/15/2012 0832     STUDIES:  No results found.  ELIGIBLE FOR AVAILABLE RESEARCH PROTOCOL:  no  ASSESSMENT: 62 y.o. [place] woman status post left breast lower inner quadrant biopsy 07/31/2018 for a clinical T1b N0, stage IA invasive ductal carcinoma, grade 3, triple positive, with an MIB-1 of 75%.  (1)  status post left lumpectomy and sentinel lymph node sampling 09/07/2018 for a pT1c pN0, stage IA invasive ductal carcinoma, grade 3, with negative margins.  (a) a total of 5 sentinel lymph nodes were removed  (2) adjuvant chemotherapy consisting of paclitaxel weekly x12 with trastuzumab every 21 days starting 10/02/2018  (3) trastuzumab to be continued to complete 1 year  (a) echocardiogram 08/17/2018 shows an ejection fraction of 60-65%  (4) adjuvant radiation as appropriate  (5) antiestrogens to start at the completion of local treatment  PLAN:  [name] tolerated her first cycle of Taxol and Herceptin remarkably well.  She will receive Taxol alone today and next week and then again repeat.  She will be due for her next echocardiogram in March  We discussed peripheral neuropathy issues.  She certainly is not having any symptoms right now but I have alerted her to let us know if any numbness or tingling of her finger pads or toe pads develop  I wrote her a prescription for cranial prosthesis.  She is aware that she will be losing her hair within the next 2 weeks  I think the confusion she has been experiencing may be partly due to stress and partly due to clonazepam.  For the nighttime hot flashes she is going to try gabapentin at bedtime  We are going to start seeing her on an every other week schedule until she completes her first 8 weekly cycles of paclitaxel after which we will see her weekly until she completes her chemotherapy.    She knows to call for any other issue that may develop before the next visit.  Keiffer Piper, Virgie Dad, MD  10/09/18 11:57 AM Medical Oncology and Hematology [facility] [address] [place], [place] [address] Tel. [phone]    Fax. [phone]  I, Jacqualyn Posey am acting as a Education administrator for Chauncey Cruel, MD.   I, Lurline Del MD, have reviewed the above documentation for accuracy and completeness, and I agree with the  above.

## 2018-10-09 ENCOUNTER — Inpatient Hospital Stay: Payer: BLUE CROSS/BLUE SHIELD

## 2018-10-09 ENCOUNTER — Inpatient Hospital Stay: Payer: BLUE CROSS/BLUE SHIELD | Attending: Oncology

## 2018-10-09 ENCOUNTER — Encounter: Payer: Self-pay | Admitting: *Deleted

## 2018-10-09 ENCOUNTER — Ambulatory Visit: Payer: BLUE CROSS/BLUE SHIELD

## 2018-10-09 ENCOUNTER — Other Ambulatory Visit: Payer: BLUE CROSS/BLUE SHIELD

## 2018-10-09 ENCOUNTER — Inpatient Hospital Stay (HOSPITAL_BASED_OUTPATIENT_CLINIC_OR_DEPARTMENT_OTHER): Payer: BLUE CROSS/BLUE SHIELD | Admitting: Oncology

## 2018-10-09 VITALS — BP 156/88 | HR 77 | Temp 98.2°F | Resp 18 | Wt 206.0 lb

## 2018-10-09 DIAGNOSIS — E039 Hypothyroidism, unspecified: Secondary | ICD-10-CM | POA: Diagnosis not present

## 2018-10-09 DIAGNOSIS — Z79899 Other long term (current) drug therapy: Secondary | ICD-10-CM

## 2018-10-09 DIAGNOSIS — Z5112 Encounter for antineoplastic immunotherapy: Secondary | ICD-10-CM

## 2018-10-09 DIAGNOSIS — Z90710 Acquired absence of both cervix and uterus: Secondary | ICD-10-CM | POA: Diagnosis not present

## 2018-10-09 DIAGNOSIS — Z801 Family history of malignant neoplasm of trachea, bronchus and lung: Secondary | ICD-10-CM

## 2018-10-09 DIAGNOSIS — L739 Follicular disorder, unspecified: Secondary | ICD-10-CM | POA: Diagnosis not present

## 2018-10-09 DIAGNOSIS — I10 Essential (primary) hypertension: Secondary | ICD-10-CM | POA: Insufficient documentation

## 2018-10-09 DIAGNOSIS — F329 Major depressive disorder, single episode, unspecified: Secondary | ICD-10-CM | POA: Diagnosis not present

## 2018-10-09 DIAGNOSIS — M542 Cervicalgia: Secondary | ICD-10-CM | POA: Diagnosis not present

## 2018-10-09 DIAGNOSIS — R232 Flushing: Secondary | ICD-10-CM | POA: Insufficient documentation

## 2018-10-09 DIAGNOSIS — R410 Disorientation, unspecified: Secondary | ICD-10-CM | POA: Insufficient documentation

## 2018-10-09 DIAGNOSIS — E785 Hyperlipidemia, unspecified: Secondary | ICD-10-CM | POA: Diagnosis not present

## 2018-10-09 DIAGNOSIS — Z5111 Encounter for antineoplastic chemotherapy: Secondary | ICD-10-CM | POA: Insufficient documentation

## 2018-10-09 DIAGNOSIS — C50212 Malignant neoplasm of upper-inner quadrant of left female breast: Secondary | ICD-10-CM

## 2018-10-09 DIAGNOSIS — C50312 Malignant neoplasm of lower-inner quadrant of left female breast: Secondary | ICD-10-CM

## 2018-10-09 DIAGNOSIS — Z170 Estrogen receptor positive status [ER+]: Principal | ICD-10-CM

## 2018-10-09 DIAGNOSIS — Z95828 Presence of other vascular implants and grafts: Secondary | ICD-10-CM

## 2018-10-09 DIAGNOSIS — R112 Nausea with vomiting, unspecified: Secondary | ICD-10-CM | POA: Insufficient documentation

## 2018-10-09 DIAGNOSIS — R21 Rash and other nonspecific skin eruption: Secondary | ICD-10-CM | POA: Diagnosis not present

## 2018-10-09 LAB — CBC WITH DIFFERENTIAL/PLATELET
Abs Immature Granulocytes: 0.06 10*3/uL (ref 0.00–0.07)
Basophils Absolute: 0 10*3/uL (ref 0.0–0.1)
Basophils Relative: 0 %
EOS PCT: 4 %
Eosinophils Absolute: 0.3 10*3/uL (ref 0.0–0.5)
HCT: 38.7 % (ref 36.0–46.0)
Hemoglobin: 12.6 g/dL (ref 12.0–15.0)
Immature Granulocytes: 1 %
Lymphocytes Relative: 29 %
Lymphs Abs: 1.9 10*3/uL (ref 0.7–4.0)
MCH: 28.4 pg (ref 26.0–34.0)
MCHC: 32.6 g/dL (ref 30.0–36.0)
MCV: 87.4 fL (ref 80.0–100.0)
Monocytes Absolute: 0.4 10*3/uL (ref 0.1–1.0)
Monocytes Relative: 6 %
Neutro Abs: 3.8 10*3/uL (ref 1.7–7.7)
Neutrophils Relative %: 60 %
Platelets: 252 10*3/uL (ref 150–400)
RBC: 4.43 MIL/uL (ref 3.87–5.11)
RDW: 12 % (ref 11.5–15.5)
WBC: 6.4 10*3/uL (ref 4.0–10.5)
nRBC: 0 % (ref 0.0–0.2)

## 2018-10-09 LAB — COMPREHENSIVE METABOLIC PANEL
ALT: 32 U/L (ref 0–44)
AST: 19 U/L (ref 15–41)
Albumin: 3.8 g/dL (ref 3.5–5.0)
Alkaline Phosphatase: 83 U/L (ref 38–126)
Anion gap: 9 (ref 5–15)
BUN: 22 mg/dL (ref 8–23)
CALCIUM: 9.2 mg/dL (ref 8.9–10.3)
CO2: 25 mmol/L (ref 22–32)
Chloride: 106 mmol/L (ref 98–111)
Creatinine, Ser: 0.86 mg/dL (ref 0.44–1.00)
GFR calc Af Amer: >60 mL/min (ref >60)
Glucose, Bld: 106 mg/dL — ABNORMAL HIGH (ref 70–99)
Potassium: 4 mmol/L (ref 3.5–5.1)
Sodium: 140 mmol/L (ref 135–145)
Total Bilirubin: 0.3 mg/dL (ref 0.3–1.2)
Total Protein: 7 g/dL (ref 6.5–8.1)

## 2018-10-09 MED ORDER — ONDANSETRON HCL 4 MG/2ML IJ SOLN
4.0000 mg | Freq: Once | INTRAMUSCULAR | Status: AC
  Administered 2018-10-09: 4 mg via INTRAVENOUS

## 2018-10-09 MED ORDER — ONDANSETRON HCL 4 MG/2ML IJ SOLN
INTRAMUSCULAR | Status: AC
  Filled 2018-10-09: qty 2

## 2018-10-09 MED ORDER — FAMOTIDINE IN NACL 20-0.9 MG/50ML-% IV SOLN
INTRAVENOUS | Status: AC
  Filled 2018-10-09: qty 50

## 2018-10-09 MED ORDER — FAMOTIDINE IN NACL 20-0.9 MG/50ML-% IV SOLN
20.0000 mg | Freq: Once | INTRAVENOUS | Status: AC
  Administered 2018-10-09: 20 mg via INTRAVENOUS

## 2018-10-09 MED ORDER — SODIUM CHLORIDE 0.9 % IV SOLN: 4.0000 mg | Freq: Once | INTRAVENOUS | Status: DC

## 2018-10-09 MED ORDER — SODIUM CHLORIDE 0.9% FLUSH
10.0000 mL | INTRAVENOUS | Status: DC | PRN
  Administered 2018-10-09: 10 mL
  Filled 2018-10-09: qty 10

## 2018-10-09 MED ORDER — SODIUM CHLORIDE 0.9 % IV SOLN
80.0000 mg/m2 | Freq: Once | INTRAVENOUS | Status: AC
  Administered 2018-10-09: 162 mg via INTRAVENOUS
  Filled 2018-10-09: qty 27

## 2018-10-09 MED ORDER — SODIUM CHLORIDE 0.9% FLUSH
10.0000 mL | INTRAVENOUS | Status: DC | PRN
  Filled 2018-10-09: qty 10

## 2018-10-09 MED ORDER — DEXAMETHASONE SODIUM PHOSPHATE 10 MG/ML IJ SOLN
INTRAMUSCULAR | Status: AC
  Filled 2018-10-09: qty 1

## 2018-10-09 MED ORDER — GABAPENTIN 300 MG PO CAPS: 300.0000 mg | ORAL_CAPSULE | Freq: Every day | ORAL | 4 refills | Status: DC

## 2018-10-09 MED ORDER — DIPHENHYDRAMINE HCL 50 MG/ML IJ SOLN
INTRAMUSCULAR | Status: AC
  Filled 2018-10-09: qty 1

## 2018-10-09 MED ORDER — DIPHENHYDRAMINE HCL 50 MG/ML IJ SOLN
25.0000 mg | Freq: Once | INTRAMUSCULAR | Status: AC
  Administered 2018-10-09: 25 mg via INTRAVENOUS

## 2018-10-09 MED ORDER — DEXAMETHASONE SODIUM PHOSPHATE 10 MG/ML IJ SOLN
4.0000 mg | Freq: Once | INTRAMUSCULAR | Status: AC
  Administered 2018-10-09: 4 mg via INTRAVENOUS

## 2018-10-09 MED ORDER — HEPARIN SOD (PORK) LOCK FLUSH 100 UNIT/ML IV SOLN
500.0000 [IU] | Freq: Once | INTRAVENOUS | Status: AC | PRN
  Administered 2018-10-09: 500 [IU]
  Filled 2018-10-09: qty 5

## 2018-10-09 MED ORDER — SODIUM CHLORIDE 0.9 % IV SOLN
Freq: Once | INTRAVENOUS | Status: AC
  Administered 2018-10-09: 13:00:00 via INTRAVENOUS
  Filled 2018-10-09: qty 250

## 2018-10-09 MED ORDER — SODIUM CHLORIDE 0.9% FLUSH
10.0000 mL | INTRAVENOUS | Status: DC | PRN
  Administered 2018-10-09: 10 mL via INTRAVENOUS
  Filled 2018-10-09: qty 10

## 2018-10-09 NOTE — Patient Instructions (Signed)
[facility] Discharge Instructions for Patients Receiving Chemotherapy  Today you received the following chemotherapy agents :  Taxol.  To help prevent nausea and vomiting after your treatment, we encourage you to take your nausea medication as prescribed.   If you develop nausea and vomiting that is not controlled by your nausea medication, call the clinic.   BELOW ARE SYMPTOMS THAT SHOULD BE REPORTED IMMEDIATELY:  *FEVER GREATER THAN 100.5 F  *CHILLS WITH OR WITHOUT FEVER  NAUSEA AND VOMITING THAT IS NOT CONTROLLED WITH YOUR NAUSEA MEDICATION  *UNUSUAL SHORTNESS OF BREATH  *UNUSUAL BRUISING OR BLEEDING  TENDERNESS IN MOUTH AND THROAT WITH OR WITHOUT PRESENCE OF ULCERS  *URINARY PROBLEMS  *BOWEL PROBLEMS  UNUSUAL RASH Items with * indicate a potential emergency and should be followed up as soon as possible.  Feel free to call the clinic should you have any questions or concerns. The clinic phone number is [phone].  Please show the CHEMO ALERT CARD at check-in to the Emergency Department and triage nurse.   

## 2018-10-14 ENCOUNTER — Encounter: Payer: Self-pay | Admitting: Pharmacist

## 2018-10-16 ENCOUNTER — Inpatient Hospital Stay: Payer: BLUE CROSS/BLUE SHIELD

## 2018-10-16 ENCOUNTER — Other Ambulatory Visit: Payer: BLUE CROSS/BLUE SHIELD

## 2018-10-16 VITALS — BP 152/90 | HR 70 | Temp 97.4°F | Resp 17 | Ht 62.0 in

## 2018-10-16 DIAGNOSIS — Z170 Estrogen receptor positive status [ER+]: Secondary | ICD-10-CM | POA: Diagnosis not present

## 2018-10-16 DIAGNOSIS — R232 Flushing: Secondary | ICD-10-CM | POA: Diagnosis not present

## 2018-10-16 DIAGNOSIS — E039 Hypothyroidism, unspecified: Secondary | ICD-10-CM | POA: Diagnosis not present

## 2018-10-16 DIAGNOSIS — E785 Hyperlipidemia, unspecified: Secondary | ICD-10-CM | POA: Diagnosis not present

## 2018-10-16 DIAGNOSIS — L739 Follicular disorder, unspecified: Secondary | ICD-10-CM | POA: Diagnosis not present

## 2018-10-16 DIAGNOSIS — I10 Essential (primary) hypertension: Secondary | ICD-10-CM | POA: Diagnosis not present

## 2018-10-16 DIAGNOSIS — Z5112 Encounter for antineoplastic immunotherapy: Secondary | ICD-10-CM | POA: Diagnosis not present

## 2018-10-16 DIAGNOSIS — R410 Disorientation, unspecified: Secondary | ICD-10-CM | POA: Diagnosis not present

## 2018-10-16 DIAGNOSIS — M542 Cervicalgia: Secondary | ICD-10-CM | POA: Diagnosis not present

## 2018-10-16 DIAGNOSIS — C50312 Malignant neoplasm of lower-inner quadrant of left female breast: Secondary | ICD-10-CM | POA: Diagnosis not present

## 2018-10-16 DIAGNOSIS — R112 Nausea with vomiting, unspecified: Secondary | ICD-10-CM | POA: Diagnosis not present

## 2018-10-16 DIAGNOSIS — C50212 Malignant neoplasm of upper-inner quadrant of left female breast: Secondary | ICD-10-CM

## 2018-10-16 DIAGNOSIS — Z90710 Acquired absence of both cervix and uterus: Secondary | ICD-10-CM | POA: Diagnosis not present

## 2018-10-16 DIAGNOSIS — Z79899 Other long term (current) drug therapy: Secondary | ICD-10-CM | POA: Diagnosis not present

## 2018-10-16 DIAGNOSIS — F329 Major depressive disorder, single episode, unspecified: Secondary | ICD-10-CM | POA: Diagnosis not present

## 2018-10-16 DIAGNOSIS — R21 Rash and other nonspecific skin eruption: Secondary | ICD-10-CM | POA: Diagnosis not present

## 2018-10-16 DIAGNOSIS — Z5111 Encounter for antineoplastic chemotherapy: Secondary | ICD-10-CM | POA: Diagnosis not present

## 2018-10-16 LAB — CBC WITH DIFFERENTIAL/PLATELET
Abs Immature Granulocytes: 0.03 10*3/uL (ref 0.00–0.07)
Basophils Absolute: 0 10*3/uL (ref 0.0–0.1)
Basophils Relative: 1 %
Eosinophils Absolute: 0.3 10*3/uL (ref 0.0–0.5)
Eosinophils Relative: 6 %
HCT: 36.7 % (ref 36.0–46.0)
Hemoglobin: 11.8 g/dL — ABNORMAL LOW (ref 12.0–15.0)
Immature Granulocytes: 1 %
LYMPHS PCT: 36 %
Lymphs Abs: 1.5 10*3/uL (ref 0.7–4.0)
MCH: 28.4 pg (ref 26.0–34.0)
MCHC: 32.2 g/dL (ref 30.0–36.0)
MCV: 88.2 fL (ref 80.0–100.0)
MONO ABS: 0.3 10*3/uL (ref 0.1–1.0)
Monocytes Relative: 7 %
Neutro Abs: 2.1 10*3/uL (ref 1.7–7.7)
Neutrophils Relative %: 49 %
Platelets: 266 10*3/uL (ref 150–400)
RBC: 4.16 MIL/uL (ref 3.87–5.11)
RDW: 12.7 % (ref 11.5–15.5)
WBC: 4.3 10*3/uL (ref 4.0–10.5)
nRBC: 0 % (ref 0.0–0.2)

## 2018-10-16 LAB — COMPREHENSIVE METABOLIC PANEL
ALT: 36 U/L (ref 0–44)
AST: 22 U/L (ref 15–41)
Albumin: 3.6 g/dL (ref 3.5–5.0)
Alkaline Phosphatase: 70 U/L (ref 38–126)
Anion gap: 9 (ref 5–15)
BUN: 14 mg/dL (ref 8–23)
CO2: 25 mmol/L (ref 22–32)
Calcium: 9.3 mg/dL (ref 8.9–10.3)
Chloride: 107 mmol/L (ref 98–111)
Creatinine, Ser: 0.81 mg/dL (ref 0.44–1.00)
GFR calc Af Amer: >60 mL/min (ref >60)
GFR calc non Af Amer: >60 mL/min (ref >60)
Glucose, Bld: 119 mg/dL — ABNORMAL HIGH (ref 70–99)
Potassium: 4 mmol/L (ref 3.5–5.1)
Sodium: 141 mmol/L (ref 135–145)
Total Bilirubin: 0.3 mg/dL (ref 0.3–1.2)
Total Protein: 6.6 g/dL (ref 6.5–8.1)

## 2018-10-16 MED ORDER — FAMOTIDINE IN NACL 20-0.9 MG/50ML-% IV SOLN
INTRAVENOUS | Status: AC
  Filled 2018-10-16: qty 50

## 2018-10-16 MED ORDER — ONDANSETRON HCL 4 MG/2ML IJ SOLN
INTRAMUSCULAR | Status: AC
  Filled 2018-10-16: qty 2

## 2018-10-16 MED ORDER — ONDANSETRON HCL 4 MG/2ML IJ SOLN
4.0000 mg | Freq: Once | INTRAMUSCULAR | Status: AC
  Administered 2018-10-16: 4 mg via INTRAVENOUS

## 2018-10-16 MED ORDER — SODIUM CHLORIDE 0.9 % IV SOLN
Freq: Once | INTRAVENOUS | Status: AC
  Administered 2018-10-16: 11:00:00 via INTRAVENOUS
  Filled 2018-10-16: qty 250

## 2018-10-16 MED ORDER — DEXAMETHASONE SODIUM PHOSPHATE 10 MG/ML IJ SOLN
INTRAMUSCULAR | Status: AC
  Filled 2018-10-16: qty 1

## 2018-10-16 MED ORDER — DIPHENHYDRAMINE HCL 50 MG/ML IJ SOLN
25.0000 mg | Freq: Once | INTRAMUSCULAR | Status: AC
  Administered 2018-10-16: 25 mg via INTRAVENOUS

## 2018-10-16 MED ORDER — DIPHENHYDRAMINE HCL 50 MG/ML IJ SOLN
INTRAMUSCULAR | Status: AC
  Filled 2018-10-16: qty 1

## 2018-10-16 MED ORDER — DEXAMETHASONE SODIUM PHOSPHATE 10 MG/ML IJ SOLN
4.0000 mg | Freq: Once | INTRAMUSCULAR | Status: AC
  Administered 2018-10-16: 4 mg via INTRAVENOUS

## 2018-10-16 MED ORDER — HEPARIN SOD (PORK) LOCK FLUSH 100 UNIT/ML IV SOLN
500.0000 [IU] | Freq: Once | INTRAVENOUS | Status: AC | PRN
  Administered 2018-10-16: 500 [IU]
  Filled 2018-10-16: qty 5

## 2018-10-16 MED ORDER — FAMOTIDINE IN NACL 20-0.9 MG/50ML-% IV SOLN
20.0000 mg | Freq: Once | INTRAVENOUS | Status: AC
  Administered 2018-10-16: 20 mg via INTRAVENOUS

## 2018-10-16 MED ORDER — SODIUM CHLORIDE 0.9% FLUSH
10.0000 mL | INTRAVENOUS | Status: DC | PRN
  Administered 2018-10-16: 10 mL
  Filled 2018-10-16: qty 10

## 2018-10-16 MED ORDER — SODIUM CHLORIDE 0.9 % IV SOLN
80.0000 mg/m2 | Freq: Once | INTRAVENOUS | Status: AC
  Administered 2018-10-16: 162 mg via INTRAVENOUS
  Filled 2018-10-16: qty 27

## 2018-10-16 NOTE — Patient Instructions (Signed)
[facility] Discharge Instructions for Patients Receiving Chemotherapy  Today you received the following chemotherapy agents :  Taxol.  To help prevent nausea and vomiting after your treatment, we encourage you to take your nausea medication as prescribed.   If you develop nausea and vomiting that is not controlled by your nausea medication, call the clinic.   BELOW ARE SYMPTOMS THAT SHOULD BE REPORTED IMMEDIATELY:  *FEVER GREATER THAN 100.5 F  *CHILLS WITH OR WITHOUT FEVER  NAUSEA AND VOMITING THAT IS NOT CONTROLLED WITH YOUR NAUSEA MEDICATION  *UNUSUAL SHORTNESS OF BREATH  *UNUSUAL BRUISING OR BLEEDING  TENDERNESS IN MOUTH AND THROAT WITH OR WITHOUT PRESENCE OF ULCERS  *URINARY PROBLEMS  *BOWEL PROBLEMS  UNUSUAL RASH Items with * indicate a potential emergency and should be followed up as soon as possible.  Feel free to call the clinic should you have any questions or concerns. The clinic phone number is [phone].  Please show the CHEMO ALERT CARD at check-in to the Emergency Department and triage nurse.   

## 2018-10-20 ENCOUNTER — Other Ambulatory Visit: Payer: Self-pay | Admitting: Family Medicine

## 2018-10-22 ENCOUNTER — Other Ambulatory Visit: Payer: Self-pay | Admitting: Medical

## 2018-10-22 ENCOUNTER — Ambulatory Visit (HOSPITAL_COMMUNITY)
Admission: RE | Admit: 2018-10-22 | Discharge: 2018-10-22 | Disposition: A | Discharge status: Home / Self Care | Payer: BLUE CROSS/BLUE SHIELD | Source: Ambulatory Visit | Attending: Medical | Admitting: Medical

## 2018-10-22 ENCOUNTER — Telehealth: Payer: Self-pay | Admitting: *Deleted

## 2018-10-22 ENCOUNTER — Inpatient Hospital Stay (HOSPITAL_BASED_OUTPATIENT_CLINIC_OR_DEPARTMENT_OTHER): Payer: BLUE CROSS/BLUE SHIELD | Admitting: Medical

## 2018-10-22 VITALS — BP 143/88 | HR 90 | Temp 98.2°F | Resp 18 | Ht 62.0 in | Wt 211.0 lb

## 2018-10-22 DIAGNOSIS — Z170 Estrogen receptor positive status [ER+]: Secondary | ICD-10-CM | POA: Diagnosis not present

## 2018-10-22 DIAGNOSIS — M79601 Pain in right arm: Secondary | ICD-10-CM

## 2018-10-22 DIAGNOSIS — E785 Hyperlipidemia, unspecified: Secondary | ICD-10-CM

## 2018-10-22 DIAGNOSIS — C50312 Malignant neoplasm of lower-inner quadrant of left female breast: Secondary | ICD-10-CM

## 2018-10-22 DIAGNOSIS — C50212 Malignant neoplasm of upper-inner quadrant of left female breast: Secondary | ICD-10-CM

## 2018-10-22 DIAGNOSIS — R232 Flushing: Secondary | ICD-10-CM | POA: Diagnosis not present

## 2018-10-22 DIAGNOSIS — M542 Cervicalgia: Secondary | ICD-10-CM

## 2018-10-22 DIAGNOSIS — M791 Myalgia, unspecified site: Secondary | ICD-10-CM

## 2018-10-22 DIAGNOSIS — R410 Disorientation, unspecified: Secondary | ICD-10-CM

## 2018-10-22 DIAGNOSIS — I10 Essential (primary) hypertension: Secondary | ICD-10-CM | POA: Diagnosis not present

## 2018-10-22 DIAGNOSIS — E039 Hypothyroidism, unspecified: Secondary | ICD-10-CM | POA: Diagnosis not present

## 2018-10-22 DIAGNOSIS — Z801 Family history of malignant neoplasm of trachea, bronchus and lung: Secondary | ICD-10-CM

## 2018-10-22 DIAGNOSIS — R112 Nausea with vomiting, unspecified: Secondary | ICD-10-CM | POA: Diagnosis not present

## 2018-10-22 DIAGNOSIS — R21 Rash and other nonspecific skin eruption: Secondary | ICD-10-CM | POA: Diagnosis not present

## 2018-10-22 DIAGNOSIS — Z90710 Acquired absence of both cervix and uterus: Secondary | ICD-10-CM

## 2018-10-22 DIAGNOSIS — Z5112 Encounter for antineoplastic immunotherapy: Secondary | ICD-10-CM | POA: Diagnosis not present

## 2018-10-22 DIAGNOSIS — Z5111 Encounter for antineoplastic chemotherapy: Secondary | ICD-10-CM

## 2018-10-22 DIAGNOSIS — F329 Major depressive disorder, single episode, unspecified: Secondary | ICD-10-CM | POA: Diagnosis not present

## 2018-10-22 DIAGNOSIS — Z79899 Other long term (current) drug therapy: Secondary | ICD-10-CM

## 2018-10-22 DIAGNOSIS — M2550 Pain in unspecified joint: Secondary | ICD-10-CM

## 2018-10-22 DIAGNOSIS — L739 Follicular disorder, unspecified: Secondary | ICD-10-CM | POA: Diagnosis not present

## 2018-10-22 MED ORDER — TRAMADOL HCL 50 MG PO TABS: 50.0000 mg | ORAL_TABLET | Freq: Four times a day (QID) | ORAL | 0 refills | Status: DC | PRN

## 2018-10-22 NOTE — Telephone Encounter (Signed)
Pt called with c/o right shoulder pain that radiates to her right hand and around her back. Denies redness or swelling to arm or shoulder or lifting heavy objects.  Winferd Humphrey, PA to discuss pt symptoms. Per Lucianne Lei, [place] will be order. Called pt and discussed recommendations with pt. Received verbal understanding.

## 2018-10-22 NOTE — Patient Instructions (Signed)
Vascular Ultrasound A vascular ultrasound is a painless test that is done to see if you have blood flow problems or clots in your blood vessels. It uses harmless sound waves to take pictures of the arteries and veins in your body. The pictures are taken by passing a device (transducer) over certain areas of your body. Tell a health care provider about:  Any allergies you have.  All medicines you are taking, including vitamins, herbs, eye drops, creams, and over-the-counter medicines.  Any blood disorders you have.  Any surgeries you have had.  Any medical conditions you have.  Whether you are pregnant or may be pregnant. What are the risks? Generally, this is a safe procedure. There are no known risks or complications that arise from having an ultrasound. What happens before the procedure?  If the ultrasound scan involves your upper abdomen, you may be told not to eat or chew gum the morning of your exam. Follow your health care provider's instructions.  Do not smoke or use nicotine products at least 30 minutes before the exam.  During the test, a gel will be applied to your skin. What happens during the procedure?   A gel will be applied to your skin. It may feel cool.  The transducer will be placed on the area to be examined.  Pictures will be taken. They will be displayed on one or more monitors that look like small television screens. What happens after the procedure?  You can safely drive home immediately after your exam.  You may resume your normal diet and activities.  Keep all follow-up visits as told by your health care provider. This is important.  It is up to you to get your test results. Ask your health care provider, or the department that is doing the test: ? When will my results be ready? ? How will I get my results? ? What are my treatment options? ? What other tests do I need? ? What are my next steps? Summary  A vascular ultrasound is a painless test  that is done to see if you have blood flow problems or clots in your blood vessels. It uses harmless sound waves to take pictures of the arteries and veins in your body.  Generally, this is a safe procedure. There are no known risks or complications that arise from having an ultrasound.  A gel will be applied to your skin. It may feel cool. The device that takes the pictures (transducer) will then be placed on the area to be examined.  It is up to you to get your test results. Ask your health care provider or the department that is doing the test when your results will be ready and how you will get your results. This information is not intended to replace advice given to you by your health care provider. Make sure you discuss any questions you have with your health care provider. Document Released: 10/04/2004 Document Revised: 10/30/2017 Document Reviewed: 10/30/2017 Elsevier Interactive Patient Education  2019 [employer]vier Inc.  

## 2018-10-22 NOTE — Progress Notes (Signed)
Pt seen by PA Van only, no RN assessment at this time.  PA aware. 

## 2018-10-22 NOTE — Progress Notes (Signed)
Right upper extremity venous duplex has been completed. Negative for DVT. Results were given to Sandi Mealy PA.  10/22/18 3:34 PM Tiffany Montes RVT

## 2018-10-23 ENCOUNTER — Other Ambulatory Visit: Payer: BLUE CROSS/BLUE SHIELD

## 2018-10-23 ENCOUNTER — Encounter: Payer: Self-pay | Admitting: Hematology and Oncology

## 2018-10-23 ENCOUNTER — Inpatient Hospital Stay: Payer: BLUE CROSS/BLUE SHIELD

## 2018-10-23 ENCOUNTER — Inpatient Hospital Stay (HOSPITAL_BASED_OUTPATIENT_CLINIC_OR_DEPARTMENT_OTHER): Payer: BLUE CROSS/BLUE SHIELD | Admitting: Adult Health

## 2018-10-23 ENCOUNTER — Telehealth: Payer: Self-pay | Admitting: Adult Health

## 2018-10-23 ENCOUNTER — Ambulatory Visit (HOSPITAL_COMMUNITY)
Admission: RE | Admit: 2018-10-23 | Discharge: 2018-10-23 | Disposition: A | Discharge status: Home / Self Care | Payer: BLUE CROSS/BLUE SHIELD | Source: Ambulatory Visit | Attending: Adult Health | Admitting: Adult Health

## 2018-10-23 ENCOUNTER — Encounter: Payer: Self-pay | Admitting: Adult Health

## 2018-10-23 VITALS — BP 162/99 | HR 80 | Temp 97.7°F | Resp 18 | Ht 62.0 in | Wt 210.8 lb

## 2018-10-23 VITALS — BP 133/81

## 2018-10-23 DIAGNOSIS — E039 Hypothyroidism, unspecified: Secondary | ICD-10-CM | POA: Diagnosis not present

## 2018-10-23 DIAGNOSIS — Z170 Estrogen receptor positive status [ER+]: Secondary | ICD-10-CM

## 2018-10-23 DIAGNOSIS — R410 Disorientation, unspecified: Secondary | ICD-10-CM

## 2018-10-23 DIAGNOSIS — M25511 Pain in right shoulder: Secondary | ICD-10-CM | POA: Diagnosis not present

## 2018-10-23 DIAGNOSIS — M4802 Spinal stenosis, cervical region: Secondary | ICD-10-CM | POA: Diagnosis not present

## 2018-10-23 DIAGNOSIS — I10 Essential (primary) hypertension: Secondary | ICD-10-CM

## 2018-10-23 DIAGNOSIS — Z5111 Encounter for antineoplastic chemotherapy: Secondary | ICD-10-CM | POA: Diagnosis not present

## 2018-10-23 DIAGNOSIS — C50312 Malignant neoplasm of lower-inner quadrant of left female breast: Secondary | ICD-10-CM | POA: Diagnosis not present

## 2018-10-23 DIAGNOSIS — Z90710 Acquired absence of both cervix and uterus: Secondary | ICD-10-CM | POA: Diagnosis not present

## 2018-10-23 DIAGNOSIS — Z5112 Encounter for antineoplastic immunotherapy: Secondary | ICD-10-CM

## 2018-10-23 DIAGNOSIS — R112 Nausea with vomiting, unspecified: Secondary | ICD-10-CM

## 2018-10-23 DIAGNOSIS — E785 Hyperlipidemia, unspecified: Secondary | ICD-10-CM

## 2018-10-23 DIAGNOSIS — M542 Cervicalgia: Secondary | ICD-10-CM

## 2018-10-23 DIAGNOSIS — C50212 Malignant neoplasm of upper-inner quadrant of left female breast: Secondary | ICD-10-CM | POA: Insufficient documentation

## 2018-10-23 DIAGNOSIS — F329 Major depressive disorder, single episode, unspecified: Secondary | ICD-10-CM

## 2018-10-23 DIAGNOSIS — Z801 Family history of malignant neoplasm of trachea, bronchus and lung: Secondary | ICD-10-CM

## 2018-10-23 DIAGNOSIS — R232 Flushing: Secondary | ICD-10-CM

## 2018-10-23 DIAGNOSIS — Z79899 Other long term (current) drug therapy: Secondary | ICD-10-CM

## 2018-10-23 DIAGNOSIS — Z95828 Presence of other vascular implants and grafts: Secondary | ICD-10-CM

## 2018-10-23 DIAGNOSIS — R21 Rash and other nonspecific skin eruption: Secondary | ICD-10-CM | POA: Diagnosis not present

## 2018-10-23 DIAGNOSIS — L739 Follicular disorder, unspecified: Secondary | ICD-10-CM | POA: Diagnosis not present

## 2018-10-23 LAB — CBC WITH DIFFERENTIAL/PLATELET
Abs Immature Granulocytes: 0.08 10*3/uL — ABNORMAL HIGH (ref 0.00–0.07)
Basophils Absolute: 0.1 10*3/uL (ref 0.0–0.1)
Basophils Relative: 1 %
Eosinophils Absolute: 0.3 10*3/uL (ref 0.0–0.5)
Eosinophils Relative: 5 %
HCT: 36.7 % (ref 36.0–46.0)
Hemoglobin: 12 g/dL (ref 12.0–15.0)
Immature Granulocytes: 2 %
Lymphocytes Relative: 34 %
Lymphs Abs: 1.8 10*3/uL (ref 0.7–4.0)
MCH: 28.9 pg (ref 26.0–34.0)
MCHC: 32.7 g/dL (ref 30.0–36.0)
MCV: 88.4 fL (ref 80.0–100.0)
Monocytes Absolute: 0.4 10*3/uL (ref 0.1–1.0)
Monocytes Relative: 8 %
NEUTROS ABS: 2.7 10*3/uL (ref 1.7–7.7)
Neutrophils Relative %: 50 %
Platelets: 296 10*3/uL (ref 150–400)
RBC: 4.15 MIL/uL (ref 3.87–5.11)
RDW: 12.8 % (ref 11.5–15.5)
WBC: 5.3 10*3/uL (ref 4.0–10.5)
nRBC: 0 % (ref 0.0–0.2)

## 2018-10-23 LAB — COMPREHENSIVE METABOLIC PANEL
ALT: 52 U/L — ABNORMAL HIGH (ref 0–44)
AST: 30 U/L (ref 15–41)
Albumin: 3.7 g/dL (ref 3.5–5.0)
Alkaline Phosphatase: 70 U/L (ref 38–126)
Anion gap: 11 (ref 5–15)
BUN: 19 mg/dL (ref 8–23)
CO2: 21 mmol/L — ABNORMAL LOW (ref 22–32)
Calcium: 8.9 mg/dL (ref 8.9–10.3)
Chloride: 108 mmol/L (ref 98–111)
Creatinine, Ser: 0.79 mg/dL (ref 0.44–1.00)
GFR calc Af Amer: >60 mL/min (ref >60)
GFR calc non Af Amer: >60 mL/min (ref >60)
Glucose, Bld: 111 mg/dL — ABNORMAL HIGH (ref 70–99)
Potassium: 4.2 mmol/L (ref 3.5–5.1)
Sodium: 140 mmol/L (ref 135–145)
Total Bilirubin: <0.2 mg/dL — ABNORMAL LOW (ref 0.3–1.2)
Total Protein: 6.7 g/dL (ref 6.5–8.1)

## 2018-10-23 MED ORDER — HEPARIN SOD (PORK) LOCK FLUSH 100 UNIT/ML IV SOLN
500.0000 [IU] | Freq: Once | INTRAVENOUS | Status: AC | PRN
  Administered 2018-10-23: 500 [IU]
  Filled 2018-10-23: qty 5

## 2018-10-23 MED ORDER — DEXAMETHASONE SODIUM PHOSPHATE 10 MG/ML IJ SOLN
4.0000 mg | Freq: Once | INTRAMUSCULAR | Status: AC
  Administered 2018-10-23: 4 mg via INTRAVENOUS

## 2018-10-23 MED ORDER — FAMOTIDINE IN NACL 20-0.9 MG/50ML-% IV SOLN
INTRAVENOUS | Status: AC
  Filled 2018-10-23: qty 50

## 2018-10-23 MED ORDER — ACETAMINOPHEN 325 MG PO TABS
650.0000 mg | ORAL_TABLET | Freq: Once | ORAL | Status: AC
  Administered 2018-10-23: 650 mg via ORAL

## 2018-10-23 MED ORDER — SODIUM CHLORIDE 0.9 % IV SOLN
80.0000 mg/m2 | Freq: Once | INTRAVENOUS | Status: AC
  Administered 2018-10-23: 162 mg via INTRAVENOUS
  Filled 2018-10-23: qty 27

## 2018-10-23 MED ORDER — DEXAMETHASONE SODIUM PHOSPHATE 10 MG/ML IJ SOLN
INTRAMUSCULAR | Status: AC
  Filled 2018-10-23: qty 1

## 2018-10-23 MED ORDER — TRASTUZUMAB CHEMO 150 MG IV SOLR
600.0000 mg | Freq: Once | INTRAVENOUS | Status: AC
  Administered 2018-10-23: 600 mg via INTRAVENOUS
  Filled 2018-10-23: qty 28.57

## 2018-10-23 MED ORDER — ACETAMINOPHEN 325 MG PO TABS
ORAL_TABLET | ORAL | Status: AC
  Filled 2018-10-23: qty 2

## 2018-10-23 MED ORDER — SODIUM CHLORIDE 0.9% FLUSH
10.0000 mL | INTRAVENOUS | Status: DC | PRN
  Administered 2018-10-23: 10 mL via INTRAVENOUS
  Filled 2018-10-23: qty 10

## 2018-10-23 MED ORDER — SODIUM CHLORIDE 0.9 % IV SOLN
Freq: Once | INTRAVENOUS | Status: AC
  Administered 2018-10-23: 11:00:00 via INTRAVENOUS
  Filled 2018-10-23: qty 250

## 2018-10-23 MED ORDER — DIPHENHYDRAMINE HCL 25 MG PO CAPS: 25.0000 mg | ORAL_CAPSULE | Freq: Once | ORAL | Status: DC

## 2018-10-23 MED ORDER — SODIUM CHLORIDE 0.9% FLUSH
10.0000 mL | INTRAVENOUS | Status: DC | PRN
  Administered 2018-10-23: 10 mL
  Filled 2018-10-23: qty 10

## 2018-10-23 MED ORDER — ONDANSETRON HCL 4 MG/2ML IJ SOLN
4.0000 mg | Freq: Once | INTRAMUSCULAR | Status: AC
  Administered 2018-10-23: 4 mg via INTRAVENOUS

## 2018-10-23 MED ORDER — DIPHENHYDRAMINE HCL 50 MG/ML IJ SOLN
25.0000 mg | Freq: Once | INTRAMUSCULAR | Status: AC
  Administered 2018-10-23: 25 mg via INTRAVENOUS

## 2018-10-23 MED ORDER — DIPHENHYDRAMINE HCL 50 MG/ML IJ SOLN
INTRAMUSCULAR | Status: AC
  Filled 2018-10-23: qty 1

## 2018-10-23 MED ORDER — ONDANSETRON HCL 4 MG/2ML IJ SOLN
INTRAMUSCULAR | Status: AC
  Filled 2018-10-23: qty 2

## 2018-10-23 MED ORDER — FAMOTIDINE IN NACL 20-0.9 MG/50ML-% IV SOLN
20.0000 mg | Freq: Once | INTRAVENOUS | Status: AC
  Administered 2018-10-23: 20 mg via INTRAVENOUS

## 2018-10-23 NOTE — Progress Notes (Signed)
[facility] Progress Note   Tiffany Montes [id number] 01/17/1956 63 y.o.  Tiffany Montes is managed by Dr. 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  Her next treatment is due on 10/23/2018.  2) Myalgias and arthralgias: I discussed with the patient that her symptoms could be due to Paclitaxel.  She was given a prescription for Tramadol and was told to remain as active as possible.  She was referred for a RUE ultrasound which was negative for a DVT.   Please see After Visit Summary for patient specific instructions.  Future Appointments  Date Time Provider [place]  10/30/2018  8:45 AM [facility] LAB 4 [facility] None  10/30/2018  9:00 AM [facility] [place] FLUSH [facility] None  10/30/2018 10:00 AM [facility] INFUSION [facility] None  11/06/2018  8:45 AM [facility] LAB 6 [facility] None  11/06/2018  9:00 AM [facility] [place] None  11/06/2018  9:30 AM Magrinat, Virgie Dad, MD [facility] None  11/06/2018 10:00 AM [facility] INFUSION [facility] None  11/13/2018  8:45 AM [facility] LAB 6 [facility] None  11/13/2018  9:00 AM [facility] [place] FLUSH [facility] None  11/13/2018 10:00 AM [facility] INFUSION [facility] None  11/20/2018  8:45 AM [facility] LAB 4 [facility] None  11/20/2018  9:00 AM [facility] [place] FLUSH [facility] None  11/20/2018 10:00 AM [facility] INFUSION [facility] None  11/27/2018  8:45 AM [facility] LAB ONLY [facility] None  11/27/2018  9:00 AM [facility] [place] FLUSH [facility] None  11/27/2018 10:00 AM [facility] INFUSION [facility] None    12/04/2018  8:45 AM [facility] LAB 4 [facility] None  12/04/2018  9:00 AM [facility] [place] FLUSH [facility] None  12/04/2018 10:00 AM [facility] INFUSION [facility] None    No orders of the defined types were placed in this encounter.      Subjective:   Patient ID:  Tiffany Montes is a 63 y.o. (DOB [birth year]) female.  Chief Complaint:  Chief Complaint  Patient presents with  . Results    HPI Tiffany Montes is a 63 y.o. female with a stage IA triple positive invasive ductal carcinoma of the left breast.  She is managed by Dr. Jana Hakim.  She is s/p cycle 4 day 1 of Paclitaxel which was dosed on 10/16/2018.  She presents with pain in her right arm and neck with has started since her last chemotherapy.  A RUE ultrasound returned negative today.  Medications: I have reviewed the patient's current medications.  Allergies:  Allergies  Allergen Reactions  . Morphine Sulfate Nausea And Vomiting and Rash    GI upset    Past Medical History:  Diagnosis Date  . Anxiety   . Attention deficit disorder   . Cancer ([facility])    Left breast CA  . Depression   . Gallstones 09/05/2011  . Hyperlipidemia   . Hypertension   . Hypothyroidism   . Migraines   . PONV (postoperative nausea and vomiting)    diffficulty waking up  . Thyroid disease    hypothyroid    Past Surgical History:  Procedure Laterality Date  . ABDOMINAL HYSTERECTOMY  2003  . BREAST BIOPSY Left 2014  benign  . BREAST LUMPECTOMY WITH RADIOACTIVE SEED AND SENTINEL LYMPH NODE BIOPSY Left 09/07/2018   Procedure: LEFT BREAST LUMPECTOMY WITH RADIOACTIVE SEED AND AXILLARY SENTINEL LYMPH NODE BIOPSY,INJECT BLUE DYE LEFT BREAST;  Surgeon: Fanny Skates, MD;  Location: [facility];  Service: General;  Laterality: Left;  . CHOLECYSTECTOMY  09/26/2011   Procedure: LAPAROSCOPIC CHOLECYSTECTOMY WITH INTRAOPERATIVE CHOLANGIOGRAM;  Surgeon: Haywood Lasso, MD;  Location: [place];  Service: General;  Laterality: N/A;  . COLONOSCOPY     . CYSTIC HYGROMA EXCISION    . DENTAL SURGERY     skin graft from top of mouth to lower gums  . PORTACATH PLACEMENT N/A 09/07/2018   Procedure: INSERTION PORT-A-CATH WITH [place];  Surgeon: Fanny Skates, MD;  Location: [facility] OR;  Service: General;  Laterality: N/A;    Family History  Problem Relation Age of Onset  . Diabetes Father   . Cancer Father        lung   . Heart disease Father     Social History   Socioeconomic History  . Marital status: Divorced    Spouse name: Not on file  . Number of children: Not on file  . Years of education: Not on file  . Highest education level: Not on file  Occupational History  . Not on file  Social Needs  . Financial resource strain: Not on file  . Food insecurity:    Worry: Not on file    Inability: Not on file  . Transportation needs:    Medical: Not on file    Non-medical: Not on file  Tobacco Use  . Smoking status: Never Smoker  . Smokeless tobacco: Never Used  Substance and Sexual Activity  . Alcohol use: Yes    Comment: 1-2 a week and reports not every week  . Drug use: No  . Sexual activity: Not on file  Lifestyle  . Physical activity:    Days per week: Not on file    Minutes per session: Not on file  . Stress: Not on file  Relationships  . Social connections:    Talks on phone: Not on file    Gets together: Not on file    Attends religious service: Not on file    Active member of club or organization: Not on file    Attends meetings of clubs or organizations: Not on file    Relationship status: Not on file  . Intimate partner violence:    Fear of current or ex partner: Not on file    Emotionally abused: Not on file    Physically abused: Not on file    Forced sexual activity: Not on file  Other Topics Concern  . Not on file  Social History Narrative  . Not on file    Past Medical History, Surgical history, Social history, and Family history were reviewed and updated as appropriate.   Please see review of  systems for further details on the patient's review from today.   Review of Systems:  Review of Systems  Constitutional: Negative for chills, diaphoresis and fever.  HENT: Negative for trouble swallowing and voice change.   Respiratory: Negative for cough, chest tightness, shortness of breath and wheezing.   Cardiovascular: Negative for chest pain and palpitations.  Gastrointestinal: Negative for abdominal pain, constipation, diarrhea, nausea and vomiting.  Musculoskeletal: Positive for arthralgias and myalgias. Negative for back pain.  Neurological: Negative for dizziness, light-headedness and headaches.    Objective:   Physical Exam:  BP Marland Kitchen)  143/88 (BP Location: Right Arm, Patient Position: Sitting) Comment: nurse aware of bp  Pulse 90   Temp 98.2 F (36.8 C) (Oral)   Resp 18   Ht 5\' 2"  (1.575 m)   Wt 211 lb (95.7 kg)   SpO2 99%   BMI 38.59 kg/m  ECOG: 0  Physical Exam Constitutional:      General: She is not in acute distress.    Appearance: She is not diaphoretic.  HENT:     Head: Normocephalic and atraumatic.  Cardiovascular:     Rate and Rhythm: Normal rate and regular rhythm.     Heart sounds: Normal heart sounds. No murmur. No friction rub. No gallop.   Pulmonary:     Effort: Pulmonary effort is normal. No respiratory distress.     Breath sounds: Normal breath sounds. No wheezing or rales.  Musculoskeletal: Normal range of motion.        General: Tenderness (Tenderness over the right biceps.) present. No swelling or deformity.  Skin:    General: Skin is warm and dry.     Findings: No erythema or rash.  Neurological:     General: No focal deficit present.     Mental Status: She is alert.     Gait: Gait normal.  Psychiatric:        Mood and Affect: Mood normal.        Behavior: Behavior normal.        Thought Content: Thought content normal.        Judgment: Judgment normal.     Lab Review:     Component Value Date/Time   NA 140 10/23/2018 0915   K  4.2 10/23/2018 0915   CL 108 10/23/2018 0915   CO2 21 (L) 10/23/2018 0915   GLUCOSE 111 (H) 10/23/2018 0915   BUN 19 10/23/2018 0915   CREATININE 0.79 10/23/2018 0915   CREATININE 0.89 08/12/2018 1222   CALCIUM 8.9 10/23/2018 0915   PROT 6.7 10/23/2018 0915   ALBUMIN 3.7 10/23/2018 0915   AST 30 10/23/2018 0915   AST 14 (L) 08/12/2018 1222   ALT 52 (H) 10/23/2018 0915   ALT 14 08/12/2018 1222   ALKPHOS 70 10/23/2018 0915   BILITOT <0.2 (L) 10/23/2018 0915   BILITOT 0.2 (L) 08/12/2018 1222   GFRNONAA >60 10/23/2018 0915   GFRNONAA >60 08/12/2018 1222   GFRAA >60 10/23/2018 0915   GFRAA >60 08/12/2018 1222       Component Value Date/Time   WBC 5.3 10/23/2018 0915   RBC 4.15 10/23/2018 0915   HGB 12.0 10/23/2018 0915   HGB 13.4 08/12/2018 1222   HCT 36.7 10/23/2018 0915   PLT 296 10/23/2018 0915   PLT 242 08/12/2018 1222   MCV 88.4 10/23/2018 0915   MCH 28.9 10/23/2018 0915   MCHC 32.7 10/23/2018 0915   RDW 12.8 10/23/2018 0915   LYMPHSABS 1.8 10/23/2018 0915   MONOABS 0.4 10/23/2018 0915   EOSABS 0.3 10/23/2018 0915   BASOSABS 0.1 10/23/2018 0915   -------------------------------  Imaging from last 24 hours (if applicable):  Radiology interpretation: Vas [place] Upper Extremity Venous Duplex  Result Date: 10/22/2018 UPPER VENOUS STUDY  Indications: Pain Performing Technologist: Oliver Hum RVT  Examination Guidelines: A complete evaluation includes B-mode imaging, spectral Doppler, color Doppler, and power Doppler as needed of all accessible portions of each vessel. Bilateral testing is considered an integral part of a complete examination. Limited examinations for reoccurring indications may be performed as noted.  Right Findings: +----------+------------+----------+---------+-----------+-------+  RIGHT     CompressiblePropertiesPhasicitySpontaneousSummary +----------+------------+----------+---------+-----------+-------+ IJV           Full                 Yes        Yes            +----------+------------+----------+---------+-----------+-------+ Subclavian    Full                 Yes       Yes            +----------+------------+----------+---------+-----------+-------+ Axillary      Full                 Yes       Yes            +----------+------------+----------+---------+-----------+-------+ Brachial      Full                 Yes       Yes            +----------+------------+----------+---------+-----------+-------+ Radial        Full                                          +----------+------------+----------+---------+-----------+-------+ Ulnar         Full                                          +----------+------------+----------+---------+-----------+-------+ Cephalic      Full                                          +----------+------------+----------+---------+-----------+-------+ Basilic       Full                                          +----------+------------+----------+---------+-----------+-------+  Left Findings: +----------+------------+----------+---------+-----------+-------+ LEFT      CompressiblePropertiesPhasicitySpontaneousSummary +----------+------------+----------+---------+-----------+-------+ Subclavian    Full                 Yes       Yes            +----------+------------+----------+---------+-----------+-------+  Summary:  Right: No evidence of deep vein thrombosis in the upper extremity. No evidence of superficial vein thrombosis in the upper extremity.  Left: No evidence of thrombosis in the subclavian.  *See table(s) above for measurements and observations.  Diagnosing physician: Servando Snare MD Electronically signed by Servando Snare MD on 10/22/2018 at 3:45:02 PM.    Final

## 2018-10-23 NOTE — Telephone Encounter (Signed)
No los °

## 2018-10-23 NOTE — Patient Instructions (Signed)
[facility] Discharge Instructions for Patients Receiving Chemotherapy  Today you received the following chemotherapy agents :  Herceptin,  Taxol.  To help prevent nausea and vomiting after your treatment, we encourage you to take your nausea medication as prescribed.   If you develop nausea and vomiting that is not controlled by your nausea medication, call the clinic.   BELOW ARE SYMPTOMS THAT SHOULD BE REPORTED IMMEDIATELY:  *FEVER GREATER THAN 100.5 F  *CHILLS WITH OR WITHOUT FEVER  NAUSEA AND VOMITING THAT IS NOT CONTROLLED WITH YOUR NAUSEA MEDICATION  *UNUSUAL SHORTNESS OF BREATH  *UNUSUAL BRUISING OR BLEEDING  TENDERNESS IN MOUTH AND THROAT WITH OR WITHOUT PRESENCE OF ULCERS  *URINARY PROBLEMS  *BOWEL PROBLEMS  UNUSUAL RASH Items with * indicate a potential emergency and should be followed up as soon as possible.  Feel free to call the clinic should you have any questions or concerns. The clinic phone number is [phone].  Please show the CHEMO ALERT CARD at check-in to the Emergency Department and triage nurse.   

## 2018-10-23 NOTE — Progress Notes (Signed)
Tiffany Montes  [phone] [phone]     ID: Tiffany Montes DOB: [birth year]  MR#: [id number]  UKG#:[id number]  Patient Care Team: Eulas Post, MD as PCP - General Fanny Skates, MD as Consulting Physician (General Surgery) Magrinat, Virgie Dad, MD as Consulting Physician (Oncology) Kyung Rudd, MD as Consulting Physician (Radiation Oncology) Maisie Fus, MD as Consulting Physician (Obstetrics and Gynecology) Larey Dresser, MD as Consulting Physician (Cardiology) OTHER MD:   CHIEF COMPLAINT: Estrogen receptor positive breast cancer, HER-2 amplified  CURRENT TREATMENT: Adjuvant paclitaxel/ trastuzumab   HISTORY OF CURRENT ILLNESS: From the original intake note:  "Tiffany Montes" had routine screening mammography on 07/22/2018 showing a possible abnormality in the left breast. She underwent unilateral diagnostic mammography with tomography and left breast ultrasonography at [facility] on 07/28/2018 showing: Breast density Category C; a 7 x 8 x 8 mm hypoechoic mass in the left breast lower inner quadrant, consistent with a complex cyst.. No abnormal lymph nodes in the left axilla.  Accordingly on 07/31/2018 she proceeded to biopsy of the left breast area in question. The pathology from this procedure showed ([id number]): Invasive Ductal Carcinoma, Grade 3. Prognostic indicators significant for: estrogen receptor, 95% positive and progesterone receptor, 85% positive, both with strong staining intensity. Proliferation marker Ki67 at 75%. HER2 positive by immunohistochemistry, 3+.   The patient's subsequent history is as detailed below.  INTERVAL HISTORY: Tiffany Montes returns today for follow-up of her estrogen receptor positive and HER-2 positive breast cancer.   Tiffany Montes continues on adjuvant chemotherapy, consisting of paclitaxel weekly x12 with trastuzumab every 21 days.  Since her last visit here she was seen by Tiffany Lei in symptom management clinic  due to arm pain in her right arm.  She underwent a doppler that was negative for a DVT.    She has this issue of right arm pain.  This started between her second and third cycle of Paclitaxel (10/09/2018 and 10/16/2018).  At that time she noted some right arm soreness.  The soreness was focused in the right upper shoulder it lasted for 3 days and improved.  Everything was fine by the time she received her third chemotherapy.  However, since Sunday, 10/18/2018 the pain began again.  She initially felt a right shoulder down into her arm discomfort.  She notes that she has had intermittent numbness and tingling in the right arm.  She notes some activities with her arm such as unlocking a door will shoot pain up into her neck.  Right now she describes as a deep aching pain, like she has been punched very hard in the shoulder causing the rest of her arm to feel a tightness around it.    REVIEW OF SYSTEMS: Tiffany Montes is otherwise doing well today.  She hasn't had any headaches, vision changes, or mouth sores.  She denies peripherla neuropathy.  She hasn't noted cough, shortness of breath, chest pain, or palpitations.  She is without bowel/bladder changes, nausea, or vomiting.  A detailed ROS was otherwise non contributory.    PAST MEDICAL HISTORY: Past Medical History:  Diagnosis Date  . Anxiety   . Attention deficit disorder   . Cancer ([place])    Left breast CA  . Depression   . Gallstones 09/05/2011  . Hyperlipidemia   . Hypertension   . Hypothyroidism   . Migraines   . PONV (postoperative nausea and vomiting)    diffficulty waking up  . Thyroid disease    hypothyroid    PAST  SURGICAL HISTORY: Past Surgical History:  Procedure Laterality Date  . ABDOMINAL HYSTERECTOMY  2003  . BREAST BIOPSY Left 2014   benign  . BREAST LUMPECTOMY WITH RADIOACTIVE SEED AND SENTINEL LYMPH NODE BIOPSY Left 09/07/2018   Procedure: LEFT BREAST LUMPECTOMY WITH RADIOACTIVE SEED AND AXILLARY SENTINEL LYMPH NODE  BIOPSY,INJECT BLUE DYE LEFT BREAST;  Surgeon: Fanny Skates, MD;  Location: [facility];  Service: General;  Laterality: Left;  . CHOLECYSTECTOMY  09/26/2011   Procedure: LAPAROSCOPIC CHOLECYSTECTOMY WITH INTRAOPERATIVE CHOLANGIOGRAM;  Surgeon: Haywood Lasso, MD;  Location: [facility];  Service: General;  Laterality: N/A;  . COLONOSCOPY    . CYSTIC HYGROMA EXCISION    . DENTAL SURGERY     skin graft from top of mouth to lower gums  . PORTACATH PLACEMENT N/A 09/07/2018   Procedure: INSERTION PORT-A-CATH WITH [place];  Surgeon: Fanny Skates, MD;  Location: [facility] OR;  Service: General;  Laterality: N/A;    FAMILY HISTORY Family History  Problem Relation Age of Onset  . Diabetes Father   . Cancer Father        lung   . Heart disease Father    She notes that her father died from CHF at age 41.  He was diagnosed with lung cancer at age 89. Patients' mother is 13 years old as of November 2019. The patient has 1 brother and 2 sisters. Patient denies a family history of ovarian or breast cancer.   GYNECOLOGIC HISTORY:  No LMP recorded. Patient has had a hysterectomy. 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 Job is very stressful and schedule is  She is divorced and lives by herself with her 2 dogs. Her daughter Tiffany Montes is a Dealer in [place], [name].  The patient's son, Tiffany Montes is a Training and development officer and lives in [place]. The patient has 2 grandchildren and 1 great-grand child.  She is not a church or tender    ADVANCED DIRECTIVES: Her [place]ometown is her daughter, Tiffany Montes, [phone].     HEALTH MAINTENANCE: Social History   Tobacco Use  . Smoking status: Never Smoker  . Smokeless tobacco: Never Used  Substance Use  Topics  . Alcohol use: Yes    Comment: 1-2 a week and reports not every week  . Drug use: No     Colonoscopy: January 2013  PAP: 1 month ago  Bone density: Yes, 2 years ago   Allergies  Allergen Reactions  . Morphine Sulfate Nausea And Vomiting and Rash    GI upset    Current Outpatient Medications  Medication Sig Dispense Refill  . amphetamine-dextroamphetamine (ADDERALL XR) 30 MG 24 hr capsule Take one tablet by mouth daily. 30 capsule 0  . augmented betamethasone dipropionate (DIPROLENE-AF) 0.05 % cream APPLY TO AFFECTED AREA TWICE A DAY (Patient taking differently: Apply 1 application topically 2 (two) times daily as needed (for eczema). ) 30 g 2  . benzonatate (TESSALON) 200 MG capsule TAKE 1 CAPSULE AS NEEDED FOR ALLERGIES(COUGH) (Patient taking differently: Take 200 mg by mouth daily as needed (allergies/cough). ) 60 capsule 0  . BIOTIN PO Take 1 tablet by mouth daily.    Marland Kitchen buPROPion (WELLBUTRIN XL) 300 MG 24 hr tablet TAKE 1 TABLET BY MOUTH EVERY DAY (  Patient taking differently: Take 300 mg by mouth daily. ) 90 tablet 0  . Cholecalciferol (VITAMIN D3 PO) Take 1 tablet by mouth daily.    . clonazePAM (KLONOPIN) 0.5 MG tablet TAKE 1 TABLET BY MOUTH AT BEDTIME AS NEEDED (Patient taking differently: Take 0.5 mg by mouth daily as needed (anxiety/panic attacks.). ) 30 tablet 5  . fluticasone (FLONASE) 50 MCG/ACT nasal spray SPRAY 2 SPRAYS INTO EACH NOSTRIL EVERY DAY (Patient taking differently: Place 2 sprays into both nostrils daily as needed for allergies. ) 16 g 2  . gabapentin (NEURONTIN) 300 MG capsule Take 1 capsule (300 mg total) by mouth at bedtime. 90 capsule 4  . levothyroxine (SYNTHROID, LEVOTHROID) 125 MCG tablet TAKE 1 TABLET BY MOUTH EVERY DAY (Patient taking differently: Take 125 mcg by mouth daily before breakfast. ) 90 tablet 1  . lidocaine-prilocaine (EMLA) cream Apply to affected area once (Patient taking differently: Apply 1 application topically daily as needed  (prior to port is accessed.). ) 30 g 3  . losartan-hydrochlorothiazide (HYZAAR) 50-12.5 MG tablet TAKE 1 TABLET BY MOUTH EVERY DAY 90 tablet 1  . Multiple Vitamins-Minerals (MULTIVITAMINS THER. W/MINERALS) TABS Take 1 tablet by mouth daily.      . prochlorperazine (COMPAZINE) 10 MG tablet Take 1 tablet (10 mg total) by mouth every 6 (six) hours as needed (Nausea or vomiting). 30 tablet 1  . sertraline (ZOLOFT) 50 MG tablet TAKE 1 TABLET BY MOUTH EVERY DAY (Patient taking differently: Take 50 mg by mouth daily. ) 90 tablet 2  . SUMAtriptan (IMITREX) 100 MG tablet TAKE 1 TABLET EVERY DAY AS NEEDED FOR MIGRAINE, MAY REPEAT IN 24 HOURS (Patient taking differently: Take 100 mg by mouth every 2 (two) hours as needed for migraine. ) 9 tablet 2  . traMADol (ULTRAM) 50 MG tablet Take 1 tablet (50 mg total) by mouth every 6 (six) hours as needed. 30 tablet 0   No current facility-administered medications for this visit.     OBJECTIVE: Middle-aged white woman in no acute distress  There were no vitals filed for this visit.   There is no height or weight on file to calculate BMI.   Wt Readings from Last 3 Encounters:  10/22/18 211 lb (95.7 kg)  10/09/18 206 lb (93.4 kg)  10/02/18 208 lb 4.8 oz (94.5 kg)  ECOG FS:1 - Symptomatic but completely ambulatory GENERAL: Patient is a well appearing female in no acute distress HEENT:  Sclerae anicteric.  Oropharynx clear and moist. No ulcerations or evidence of oropharyngeal candidiasis. Neck is supple.  NODES:  No cervical, supraclavicular, or axillary lymphadenopathy palpated.  BREAST EXAM:  Deferred. LUNGS:  Clear to auscultation bilaterally.  No wheezes or rhonchi. HEART:  Regular rate and rhythm. No murmur appreciated. ABDOMEN:  Soft, nontender.  Positive, normoactive bowel sounds. No organomegaly palpated. MSK:  No focal spinal tenderness to palpation. Full range of motion bilaterally in the upper extremities, + pain with Right shoulder ROM, Right scapula  + TTP, no tenderness over subacromial space, no tenderness over rotator cuff, no pain or axillary lymphadenopathy.   EXTREMITIES:  No peripheral edema.  Good cap refill in fingertips bilaterally.  SKIN:  Clear with no obvious rashes or skin changes. No nail dyscrasia. NEURO:  Nonfocal. Well oriented.  Appropriate affect.      LAB RESULTS:  CMP     Component Value Date/Time   NA 141 10/16/2018 0927   K 4.0 10/16/2018 0927   CL 107 10/16/2018 0927   CO2  25 10/16/2018 0927   GLUCOSE 119 (H) 10/16/2018 0927   BUN 14 10/16/2018 0927   CREATININE 0.81 10/16/2018 0927   CREATININE 0.89 08/12/2018 1222   CALCIUM 9.3 10/16/2018 0927   PROT 6.6 10/16/2018 0927   ALBUMIN 3.6 10/16/2018 0927   AST 22 10/16/2018 0927   AST 14 (L) 08/12/2018 1222   ALT 36 10/16/2018 0927   ALT 14 08/12/2018 1222   ALKPHOS 70 10/16/2018 0927   BILITOT 0.3 10/16/2018 0927   BILITOT 0.2 (L) 08/12/2018 1222   GFRNONAA >60 10/16/2018 0927   GFRNONAA >60 08/12/2018 1222   GFRAA >60 10/16/2018 0927   GFRAA >60 08/12/2018 1222    No results found for: TOTALPROTELP, ALBUMINELP, A1GS, A2GS, BETS, BETA2SER, GAMS, MSPIKE, SPEI  No results found for: KPAFRELGTCHN, LAMBDASER, [facility]  Lab Results  Component Value Date   WBC 4.3 10/16/2018   NEUTROABS 2.1 10/16/2018   HGB 11.8 (L) 10/16/2018   HCT 36.7 10/16/2018   MCV 88.2 10/16/2018   PLT 266 10/16/2018    @LASTCHEMISTRY @  No results found for: LABCA2  No components found for: XAJOIN867  No results for input(s): INR in the last 168 hours.  No results found for: LABCA2  No results found for: EHM094  No results found for: BSJ628  No results found for: ZMO294  No results found for: CA2729  No components found for: HGQUANT  No results found for: CEA1 / No results found for: CEA1   No results found for: AFPTUMOR  No results found for: CHROMOGRNA  No results found for: PSA1  No visits with results within 3 Day(s) from this visit.    Latest known visit with results is:  Appointment on 10/16/2018  Component Date Value Ref Range Status  . Sodium 10/16/2018 141  135 - 145 mmol/L Final  . Potassium 10/16/2018 4.0  3.5 - 5.1 mmol/L Final  . Chloride 10/16/2018 107  98 - 111 mmol/L Final  . CO2 10/16/2018 25  22 - 32 mmol/L Final  . Glucose, Bld 10/16/2018 119* 70 - 99 mg/dL Final  . BUN 10/16/2018 14  8 - 23 mg/dL Final  . Creatinine, Ser 10/16/2018 0.81  0.44 - 1.00 mg/dL Final  . Calcium 10/16/2018 9.3  8.9 - 10.3 mg/dL Final  . Total Protein 10/16/2018 6.6  6.5 - 8.1 g/dL Final  . Albumin 10/16/2018 3.6  3.5 - 5.0 g/dL Final  . AST 10/16/2018 22  15 - 41 U/L Final  . ALT 10/16/2018 36  0 - 44 U/L Final  . Alkaline Phosphatase 10/16/2018 70  38 - 126 U/L Final  . Total Bilirubin 10/16/2018 0.3  0.3 - 1.2 mg/dL Final  . GFR calc non Af Amer 10/16/2018 >60  >60 mL/min Final  . GFR calc Af Amer 10/16/2018 >60  >60 mL/min Final  . Anion gap 10/16/2018 9  5 - 15 Final   Performed at [facility], [place] [address], [place], [place] [address]  . WBC 10/16/2018 4.3  4.0 - 10.5 K/uL Final  . RBC 10/16/2018 4.16  3.87 - 5.11 MIL/uL Final  . Hemoglobin 10/16/2018 11.8* 12.0 - 15.0 g/dL Final  . HCT 10/16/2018 36.7  36.0 - 46.0 % Final  . MCV 10/16/2018 88.2  80.0 - 100.0 fL Final  . MCH 10/16/2018 28.4  26.0 - 34.0 pg Final  . MCHC 10/16/2018 32.2  30.0 - 36.0 g/dL Final  . RDW 10/16/2018 12.7  11.5 - 15.5 % Final  .  Platelets 10/16/2018 266  150 - 400 K/uL Final  . nRBC 10/16/2018 0.0  0.0 - 0.2 % Final  . Neutrophils Relative % 10/16/2018 49  % Final  . Neutro Abs 10/16/2018 2.1  1.7 - 7.7 K/uL Final  . Lymphocytes Relative 10/16/2018 36  % Final  . Lymphs Abs 10/16/2018 1.5  0.7 - 4.0 K/uL Final  . Monocytes Relative 10/16/2018 7  % Final  . Monocytes Absolute 10/16/2018 0.3  0.1 - 1.0 K/uL Final  . Eosinophils Relative 10/16/2018 6  % Final  . Eosinophils Absolute 10/16/2018 0.3  0.0 - 0.5  K/uL Final  . Basophils Relative 10/16/2018 1  % Final  . Basophils Absolute 10/16/2018 0.0  0.0 - 0.1 K/uL Final  . Immature Granulocytes 10/16/2018 1  % Final  . Abs Immature Granulocytes 10/16/2018 0.03  0.00 - 0.07 K/uL Final   Performed at [facility], [address] [place], [place] [address]    (this displays the last labs from the last 3 days)  No results found for: TOTALPROTELP, ALBUMINELP, A1GS, A2GS, BETS, BETA2SER, GAMS, MSPIKE, SPEI (this displays SPEP labs)  No results found for: KPAFRELGTCHN, LAMBDASER, KAPLAMBRATIO (kappa/lambda light chains)  No results found for: HGBA, HGBA2QUANT, HGBFQUANT, HGBSQUAN (Hemoglobinopathy evaluation)   No results found for: LDH  No results found for: IRON, TIBC, IRONPCTSAT (Iron and TIBC)  No results found for: FERRITIN  Urinalysis    Component Value Date/Time   COLORURINE YELLOW 09/13/2011 [place] 09/13/2011 0952   LABSPEC 1.010 09/13/2011 0952   PHURINE 6.5 09/13/2011 [place] 09/13/2011 0952   HGBUR NEGATIVE 09/13/2011 0952   HGBUR negative 04/03/2009 0949   BILIRUBINUR n 09/15/2012 [place] 09/13/2011 0952   PROTEINUR n 09/15/2012 0832   PROTEINUR NEGATIVE 09/13/2011 0952   UROBILINOGEN 0.2 09/15/2012 0832   UROBILINOGEN 0.2 09/13/2011 0952   NITRITE n 09/15/2012 0832   NITRITE NEGATIVE 09/13/2011 0952   LEUKOCYTESUR Negative 09/15/2012 0832     STUDIES:  Vas [place] Upper Extremity Venous Duplex  Result Date: 10/22/2018 UPPER VENOUS STUDY  Indications: Pain Performing Technologist: Oliver Hum RVT  Examination Guidelines: A complete evaluation includes B-mode imaging, spectral Doppler, color Doppler, and power Doppler as needed of all accessible portions of each vessel. Bilateral testing is considered an integral part of a complete examination. Limited examinations for reoccurring indications may be performed as noted.  Right Findings:  +----------+------------+----------+---------+-----------+-------+ RIGHT     CompressiblePropertiesPhasicitySpontaneousSummary +----------+------------+----------+---------+-----------+-------+ IJV           Full                 Yes       Yes            +----------+------------+----------+---------+-----------+-------+ Subclavian    Full                 Yes       Yes            +----------+------------+----------+---------+-----------+-------+ Axillary      Full                 Yes       Yes            +----------+------------+----------+---------+-----------+-------+ Brachial      Full                 Yes       Yes            +----------+------------+----------+---------+-----------+-------+  Radial        Full                                          +----------+------------+----------+---------+-----------+-------+ Ulnar         Full                                          +----------+------------+----------+---------+-----------+-------+ Cephalic      Full                                          +----------+------------+----------+---------+-----------+-------+ Basilic       Full                                          +----------+------------+----------+---------+-----------+-------+  Left Findings: +----------+------------+----------+---------+-----------+-------+ LEFT      CompressiblePropertiesPhasicitySpontaneousSummary +----------+------------+----------+---------+-----------+-------+ Subclavian    Full                 Yes       Yes            +----------+------------+----------+---------+-----------+-------+  Summary:  Right: No evidence of deep vein thrombosis in the upper extremity. No evidence of superficial vein thrombosis in the upper extremity.  Left: No evidence of thrombosis in the subclavian.  *See table(s) above for measurements and observations.  Diagnosing physician: Servando Snare MD Electronically signed by Servando Snare MD on 10/22/2018 at 3:45:02 PM.    Final     ELIGIBLE FOR AVAILABLE RESEARCH PROTOCOL: no  ASSESSMENT: 63 y.o. [place] woman status post left breast lower inner quadrant biopsy 07/31/2018 for a clinical T1b N0, stage IA invasive ductal carcinoma, grade 3, triple positive, with an MIB-1 of 75%.  (1) status post left lumpectomy and sentinel lymph node sampling 09/07/2018 for a pT1c pN0, stage IA invasive ductal carcinoma, grade 3, with negative margins.  (a) a total of 5 sentinel lymph nodes were removed  (2) adjuvant chemotherapy consisting of paclitaxel weekly x12 with trastuzumab every 21 days starting 10/02/2018  (3) trastuzumab to be continued to complete 1 year  (a) echocardiogram 08/17/2018 shows an ejection fraction of 60-65%  (4) adjuvant radiation as appropriate  (5) antiestrogens to start at the completion of local treatment  PLAN:  Tiffany Montes is doing moderately well today with the exception of this right shoulder pain.  I reviewed it with Dr. Lindi Adie.  Her doppler was negative.  She could have strained something, or it could be a side effect of the Paclitaxel.  I ordered some xrays for Tiffany Montes to have done after her treatment today to evaluate the neck and right shoulder.    I gave Tiffany Montes the option to forego the Paclitaxel today, and receive the Trastuzumab alone to see if the pain is coming from the Paclitaxel, because we certainly wouldn't want it to worsen.  She does not want to forego the Paclitaxel today and wants to push on this week and receive it.  She will proceed with both Paclitaxel and Trastuzumab today.  Instead of Tramadol, she will take one aleve and one extra strength tylenol three times  per day to help alleviate any inflammation that may be going on.  We will await her xrays results.  She will call me on Monday and let me know how she is doing.    Tiffany Montes will return in one week for labs, f/u with Dr. Jana Hakim, and her next treatment.  She knows to call for any  other issue that may develop before the next visit.  I reviewed the above with Dr. Lindi Adie in its entirety and he is in agreement.  A total of (30) minutes of face-to-face time was spent with this patient with greater than 50% of that time in counseling and care-coordination.   Tiffany Bihari, NP  10/23/18 8:53 AM Medical Oncology and Hematology [facility] [address] [place], [place] [address] Tel. [phone]    Fax. [phone]

## 2018-10-30 ENCOUNTER — Other Ambulatory Visit: Payer: Self-pay | Admitting: Oncology

## 2018-10-30 ENCOUNTER — Inpatient Hospital Stay: Payer: BLUE CROSS/BLUE SHIELD

## 2018-10-30 ENCOUNTER — Other Ambulatory Visit: Payer: Self-pay | Admitting: Medical

## 2018-10-30 ENCOUNTER — Other Ambulatory Visit: Payer: BLUE CROSS/BLUE SHIELD

## 2018-10-30 ENCOUNTER — Inpatient Hospital Stay (HOSPITAL_BASED_OUTPATIENT_CLINIC_OR_DEPARTMENT_OTHER): Payer: BLUE CROSS/BLUE SHIELD | Admitting: Medical

## 2018-10-30 VITALS — BP 148/95 | HR 88 | Temp 98.1°F | Resp 17

## 2018-10-30 DIAGNOSIS — R232 Flushing: Secondary | ICD-10-CM | POA: Diagnosis not present

## 2018-10-30 DIAGNOSIS — C50212 Malignant neoplasm of upper-inner quadrant of left female breast: Secondary | ICD-10-CM

## 2018-10-30 DIAGNOSIS — Z170 Estrogen receptor positive status [ER+]: Principal | ICD-10-CM

## 2018-10-30 DIAGNOSIS — Z5111 Encounter for antineoplastic chemotherapy: Secondary | ICD-10-CM | POA: Diagnosis not present

## 2018-10-30 DIAGNOSIS — M542 Cervicalgia: Secondary | ICD-10-CM | POA: Diagnosis not present

## 2018-10-30 DIAGNOSIS — I10 Essential (primary) hypertension: Secondary | ICD-10-CM | POA: Diagnosis not present

## 2018-10-30 DIAGNOSIS — F329 Major depressive disorder, single episode, unspecified: Secondary | ICD-10-CM | POA: Diagnosis not present

## 2018-10-30 DIAGNOSIS — E039 Hypothyroidism, unspecified: Secondary | ICD-10-CM | POA: Diagnosis not present

## 2018-10-30 DIAGNOSIS — R21 Rash and other nonspecific skin eruption: Secondary | ICD-10-CM | POA: Diagnosis not present

## 2018-10-30 DIAGNOSIS — Z95828 Presence of other vascular implants and grafts: Secondary | ICD-10-CM | POA: Insufficient documentation

## 2018-10-30 DIAGNOSIS — R112 Nausea with vomiting, unspecified: Secondary | ICD-10-CM | POA: Diagnosis not present

## 2018-10-30 DIAGNOSIS — Z90710 Acquired absence of both cervix and uterus: Secondary | ICD-10-CM | POA: Diagnosis not present

## 2018-10-30 DIAGNOSIS — Z5112 Encounter for antineoplastic immunotherapy: Secondary | ICD-10-CM | POA: Diagnosis not present

## 2018-10-30 DIAGNOSIS — L739 Follicular disorder, unspecified: Secondary | ICD-10-CM | POA: Diagnosis not present

## 2018-10-30 DIAGNOSIS — E785 Hyperlipidemia, unspecified: Secondary | ICD-10-CM | POA: Diagnosis not present

## 2018-10-30 DIAGNOSIS — Z79899 Other long term (current) drug therapy: Secondary | ICD-10-CM | POA: Diagnosis not present

## 2018-10-30 DIAGNOSIS — R410 Disorientation, unspecified: Secondary | ICD-10-CM | POA: Diagnosis not present

## 2018-10-30 DIAGNOSIS — C50312 Malignant neoplasm of lower-inner quadrant of left female breast: Secondary | ICD-10-CM | POA: Diagnosis not present

## 2018-10-30 LAB — COMPREHENSIVE METABOLIC PANEL
ALT: 62 U/L — ABNORMAL HIGH (ref 0–44)
AST: 30 U/L (ref 15–41)
Albumin: 3.6 g/dL (ref 3.5–5.0)
Alkaline Phosphatase: 80 U/L (ref 38–126)
Anion gap: 11 (ref 5–15)
BUN: 23 mg/dL (ref 8–23)
CO2: 24 mmol/L (ref 22–32)
Calcium: 9.2 mg/dL (ref 8.9–10.3)
Chloride: 106 mmol/L (ref 98–111)
Creatinine, Ser: 0.76 mg/dL (ref 0.44–1.00)
GFR calc Af Amer: >60 mL/min (ref >60)
Glucose, Bld: 128 mg/dL — ABNORMAL HIGH (ref 70–99)
Potassium: 3.8 mmol/L (ref 3.5–5.1)
Sodium: 141 mmol/L (ref 135–145)
TOTAL PROTEIN: 6.7 g/dL (ref 6.5–8.1)
Total Bilirubin: <0.2 mg/dL — ABNORMAL LOW (ref 0.3–1.2)

## 2018-10-30 LAB — CBC WITH DIFFERENTIAL/PLATELET
Abs Immature Granulocytes: 0.14 10*3/uL — ABNORMAL HIGH (ref 0.00–0.07)
BASOS PCT: 1 %
Basophils Absolute: 0.1 10*3/uL (ref 0.0–0.1)
Eosinophils Absolute: 0.2 10*3/uL (ref 0.0–0.5)
Eosinophils Relative: 3 %
HCT: 35.4 % — ABNORMAL LOW (ref 36.0–46.0)
Hemoglobin: 11.6 g/dL — ABNORMAL LOW (ref 12.0–15.0)
Immature Granulocytes: 3 %
Lymphocytes Relative: 33 %
Lymphs Abs: 1.8 10*3/uL (ref 0.7–4.0)
MCH: 29.1 pg (ref 26.0–34.0)
MCHC: 32.8 g/dL (ref 30.0–36.0)
MCV: 88.9 fL (ref 80.0–100.0)
Monocytes Absolute: 0.5 10*3/uL (ref 0.1–1.0)
Monocytes Relative: 9 %
NEUTROS PCT: 51 %
Neutro Abs: 2.8 10*3/uL (ref 1.7–7.7)
PLATELETS: 245 10*3/uL (ref 150–400)
RBC: 3.98 MIL/uL (ref 3.87–5.11)
RDW: 12.8 % (ref 11.5–15.5)
WBC: 5.4 10*3/uL (ref 4.0–10.5)
nRBC: 0 % (ref 0.0–0.2)

## 2018-10-30 MED ORDER — DIPHENHYDRAMINE HCL 50 MG/ML IJ SOLN
INTRAMUSCULAR | Status: AC
  Filled 2018-10-30: qty 1

## 2018-10-30 MED ORDER — FAMOTIDINE IN NACL 20-0.9 MG/50ML-% IV SOLN
20.0000 mg | Freq: Once | INTRAVENOUS | Status: AC
  Administered 2018-10-30: 20 mg via INTRAVENOUS

## 2018-10-30 MED ORDER — DEXAMETHASONE SODIUM PHOSPHATE 10 MG/ML IJ SOLN
4.0000 mg | Freq: Once | INTRAMUSCULAR | Status: AC
  Administered 2018-10-30: 4 mg via INTRAVENOUS

## 2018-10-30 MED ORDER — FAMOTIDINE IN NACL 20-0.9 MG/50ML-% IV SOLN
INTRAVENOUS | Status: AC
  Filled 2018-10-30: qty 50

## 2018-10-30 MED ORDER — SODIUM CHLORIDE 0.9 % IV SOLN
Freq: Once | INTRAVENOUS | Status: AC
  Administered 2018-10-30: 11:00:00 via INTRAVENOUS
  Filled 2018-10-30: qty 250

## 2018-10-30 MED ORDER — SODIUM CHLORIDE 0.9 % IV SOLN
80.0000 mg/m2 | Freq: Once | INTRAVENOUS | Status: AC
  Administered 2018-10-30: 162 mg via INTRAVENOUS
  Filled 2018-10-30: qty 27

## 2018-10-30 MED ORDER — HEPARIN SOD (PORK) LOCK FLUSH 100 UNIT/ML IV SOLN
500.0000 [IU] | Freq: Once | INTRAVENOUS | Status: DC | PRN
  Filled 2018-10-30: qty 5

## 2018-10-30 MED ORDER — ONDANSETRON HCL 4 MG/2ML IJ SOLN
INTRAMUSCULAR | Status: AC
  Filled 2018-10-30: qty 2

## 2018-10-30 MED ORDER — SODIUM CHLORIDE 0.9% FLUSH
10.0000 mL | INTRAVENOUS | Status: DC | PRN
  Filled 2018-10-30: qty 10

## 2018-10-30 MED ORDER — ONDANSETRON HCL 4 MG/2ML IJ SOLN
4.0000 mg | Freq: Once | INTRAMUSCULAR | Status: AC
  Administered 2018-10-30: 4 mg via INTRAVENOUS

## 2018-10-30 MED ORDER — DIPHENHYDRAMINE HCL 50 MG/ML IJ SOLN
25.0000 mg | Freq: Once | INTRAMUSCULAR | Status: AC
  Administered 2018-10-30: 25 mg via INTRAVENOUS

## 2018-10-30 MED ORDER — DEXAMETHASONE SODIUM PHOSPHATE 10 MG/ML IJ SOLN
INTRAMUSCULAR | Status: AC
  Filled 2018-10-30: qty 1

## 2018-10-30 MED ORDER — MAGIC MOUTHWASH W/LIDOCAINE: 5.0000 mL | Freq: Four times a day (QID) | ORAL | 2 refills | Status: DC | PRN

## 2018-10-30 MED ORDER — SODIUM CHLORIDE 0.9% FLUSH
10.0000 mL | INTRAVENOUS | Status: DC | PRN
  Administered 2018-10-30: 10 mL
  Filled 2018-10-30: qty 10

## 2018-10-30 NOTE — Progress Notes (Signed)
Pt presents to infusion area with multiple complaints including sore throat X 4 days, "bumps" to entire head X 3 days and neck pain. Pt states she has been gargling salt water with no relief in pain. Pt noted to have small red raised bumps to head and per pt are sore to touch. No drainage noted to bumps. Pt states she doesn't know why her neck hurts. Pt vitals stable. Labs WNL. Per Rae Mar PA to see patient prior to treatment. Learta Codding, RN aware of need for pt to be seen by Lucianne Lei, [place]  Per Lucianne Lei, [place] ok to proceed with treatment

## 2018-10-30 NOTE — Patient Instructions (Signed)
[facility] Discharge Instructions for Patients Receiving Chemotherapy  Today you received the following chemotherapy agents :  Herceptin,  Taxol.  To help prevent nausea and vomiting after your treatment, we encourage you to take your nausea medication as prescribed.   If you develop nausea and vomiting that is not controlled by your nausea medication, call the clinic.   BELOW ARE SYMPTOMS THAT SHOULD BE REPORTED IMMEDIATELY:  *FEVER GREATER THAN 100.5 F  *CHILLS WITH OR WITHOUT FEVER  NAUSEA AND VOMITING THAT IS NOT CONTROLLED WITH YOUR NAUSEA MEDICATION  *UNUSUAL SHORTNESS OF BREATH  *UNUSUAL BRUISING OR BLEEDING  TENDERNESS IN MOUTH AND THROAT WITH OR WITHOUT PRESENCE OF ULCERS  *URINARY PROBLEMS  *BOWEL PROBLEMS  UNUSUAL RASH Items with * indicate a potential emergency and should be followed up as soon as possible.  Feel free to call the clinic should you have any questions or concerns. The clinic phone number is [phone].  Please show the CHEMO ALERT CARD at check-in to the Emergency Department and triage nurse.   

## 2018-11-04 NOTE — Progress Notes (Signed)
The patient was seen and reassured that the multiple small bumps over her scalp are mild folliculitis.  She was told that no intervention is needed at this point.  She was told to continue monitoring this for now.   Sandi Mealy, MHS, PA-C Physician Assistant

## 2018-11-05 NOTE — Progress Notes (Signed)
[place]  [phone] [phone]     ID: BERNITA BECKSTROM DOB: Dec 24, 1955  MR#: [id number]  YKD#:[id number]  Patient Care Team: Eulas Post, MD as PCP - General Fanny Skates, MD as Consulting Physician (General Surgery) Vy Badley, Virgie Dad, MD as Consulting Physician (Oncology) Kyung Rudd, MD as Consulting Physician (Radiation Oncology) Maisie Fus, MD as Consulting Physician (Obstetrics and Gynecology) Larey Dresser, MD as Consulting Physician (Cardiology) OTHER MD:   CHIEF COMPLAINT: Estrogen receptor positive breast cancer, HER-2 amplified  CURRENT TREATMENT: Adjuvant paclitaxel/ trastuzumab   HISTORY OF CURRENT ILLNESS: From the original intake note:  "Tiffany Montes" had routine screening mammography on 07/22/2018 showing a possible abnormality in the left breast. She underwent unilateral diagnostic mammography with tomography and left breast ultrasonography at [facility] on 07/28/2018 showing: Breast density Category C; a 7 x 8 x 8 mm hypoechoic mass in the left breast lower inner quadrant, consistent with a complex cyst.. No abnormal lymph nodes in the left axilla.  Accordingly on 07/31/2018 she proceeded to biopsy of the left breast area in question. The pathology from this procedure showed ([id number]): Invasive Ductal Carcinoma, Grade 3. Prognostic indicators significant for: estrogen receptor, 95% positive and progesterone receptor, 85% positive, both with strong staining intensity. Proliferation marker Ki67 at 75%. HER2 positive by immunohistochemistry, 3+.   The patient's subsequent history is as detailed below.  INTERVAL HISTORY: Tiffany Montes returns today for follow-up and treatment of her estrogen receptor positive and HER2 amplified breast cancer.   She continues on weekly paclitaxel. Today is day 1 cycle 7. She has hot flashes.  She has had absolutely no neuropathy so far.  She also continues on trastuzumab received  every 21 days. Today is day 14 cycle 2.    [name]'s last echocardiogram on 08/17/2018, showed an ejection fraction in the 60% - 65% range.   Since her last visit here, she underwent a cervical spine xray on 10/23/2018 showing multilevel cervical spondylosis without evidence of acute osseous findings or metastatic disease.   She also underwent a right scapula and shoulder xray on the same day showing possible hydroxyapatite deposition adjacent to the greater tuberosity as can be seen with calcific rotator cuff tendinitis or bursitis. No acute osseous findings, significant arthropathy or evidence of metastatic disease.   REVIEW OF SYSTEMS: Tiffany Montes has had a scratchy throat for the last few weeks. She has been taking halls, a throat spray, and a mouthwash to treat with little improement. She has also been clearing her throat frequently. She has a rash on her head. She also had a rash on her arms that flared up at night. Tiffany Montes mentions that she has had hives her whole life, however. No neuropathy. She has some mild diarrhea about once per week. She has had some issues with weight gain. The patient denies unusual headaches, visual changes, nausea, vomiting, or dizziness. There has been no unusual cough, phlegm production, or pleurisy. This been no change in bowel or bladder habits. The patient denies unexplained fatigue or unexplained weight loss, bleeding, or fever. A detailed review of systems was otherwise noncontributory.    PAST MEDICAL HISTORY: Past Medical History:  Diagnosis Date  . Anxiety   . Attention deficit disorder   . Cancer ([place])    Left breast CA  . Depression   . Gallstones 09/05/2011  . Hyperlipidemia   . Hypertension   . Hypothyroidism   . Migraines   . PONV (postoperative nausea and vomiting)  diffficulty waking up  . Thyroid disease    hypothyroid    PAST SURGICAL HISTORY: Past Surgical History:  Procedure Laterality Date  . ABDOMINAL HYSTERECTOMY  2003  . BREAST  BIOPSY Left 2014   benign  . BREAST LUMPECTOMY WITH RADIOACTIVE SEED AND SENTINEL LYMPH NODE BIOPSY Left 09/07/2018   Procedure: LEFT BREAST LUMPECTOMY WITH RADIOACTIVE SEED AND AXILLARY SENTINEL LYMPH NODE BIOPSY,INJECT BLUE DYE LEFT BREAST;  Surgeon: Fanny Skates, MD;  Location: [facility];  Service: General;  Laterality: Left;  . CHOLECYSTECTOMY  09/26/2011   Procedure: LAPAROSCOPIC CHOLECYSTECTOMY WITH INTRAOPERATIVE CHOLANGIOGRAM;  Surgeon: Haywood Lasso, MD;  Location: [facility];  Service: General;  Laterality: N/A;  . COLONOSCOPY    . CYSTIC HYGROMA EXCISION    . DENTAL SURGERY     skin graft from top of mouth to lower gums  . PORTACATH PLACEMENT N/A 09/07/2018   Procedure: INSERTION PORT-A-CATH WITH [place];  Surgeon: Fanny Skates, MD;  Location: [facility] OR;  Service: General;  Laterality: N/A;    FAMILY HISTORY Family History  Problem Relation Age of Onset  . Diabetes Father   . Cancer Father        lung   . Heart disease Father    She notes that her father died from CHF at age 64.  He was diagnosed with lung cancer at age 48. Patients' mother is 55 years old as of November 2019. The patient has 1 brother and 2 sisters. Patient denies a family history of ovarian or breast cancer.   GYNECOLOGIC HISTORY:  No LMP recorded. Patient has had a hysterectomy. 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 Job is very stressful and schedule is  She is divorced and lives by herself with her 2 dogs. Her daughter Tiffany Montes is a Dealer in [place], [name].  The patient's son, Tiffany Montes is a Training and development officer and lives in [place]. The patient has 2 grandchildren and 1 great-grand child.  She is not a church or tender  ADVANCED DIRECTIVES: Her  [place] is her daughter, Tiffany Montes, [phone].     HEALTH MAINTENANCE: Social History   Tobacco Use  . Smoking status: Never Smoker  . Smokeless tobacco: Never Used  Substance Use Topics  . Alcohol use: Yes    Comment: 1-2 a week and reports not every week  . Drug use: No     Colonoscopy: January 2013  PAP: 1 month ago  Bone density: Yes, 2 years ago   Allergies  Allergen Reactions  . Morphine Sulfate Nausea And Vomiting and Rash    GI upset    Current Outpatient Medications  Medication Sig Dispense Refill  . amphetamine-dextroamphetamine (ADDERALL XR) 30 MG 24 hr capsule Take one tablet by mouth daily. 30 capsule 0  . augmented betamethasone dipropionate (DIPROLENE-AF) 0.05 % cream APPLY TO AFFECTED AREA TWICE A DAY (Patient taking differently: Apply 1 application topically 2 (two) times daily as needed (for eczema). ) 30 g 2  . benzonatate (TESSALON) 200 MG capsule TAKE 1 CAPSULE AS NEEDED FOR ALLERGIES(COUGH) (Patient taking differently: Take 200 mg by mouth daily as needed (allergies/cough). ) 60 capsule 0  . BIOTIN PO Take 1 tablet by mouth daily.    Marland Kitchen buPROPion [facility]  XL) 300 MG 24 hr tablet TAKE 1 TABLET BY MOUTH EVERY DAY (Patient taking differently: Take 300 mg by mouth daily. ) 90 tablet 0  . Cholecalciferol (VITAMIN D3 PO) Take 1 tablet by mouth daily.    . clonazePAM (KLONOPIN) 0.5 MG tablet TAKE 1 TABLET BY MOUTH AT BEDTIME AS NEEDED (Patient taking differently: Take 0.5 mg by mouth daily as needed (anxiety/panic attacks.). ) 30 tablet 5  . fluticasone (FLONASE) 50 MCG/ACT nasal spray SPRAY 2 SPRAYS INTO EACH NOSTRIL EVERY DAY (Patient taking differently: Place 2 sprays into both nostrils daily as needed for allergies. ) 16 g 2  . gabapentin (NEURONTIN) 300 MG capsule Take 1 capsule (300 mg total) by mouth at bedtime. 90 capsule 4  . levothyroxine (SYNTHROID, LEVOTHROID) 125 MCG tablet TAKE 1 TABLET BY MOUTH EVERY DAY (Patient taking  differently: Take 125 mcg by mouth daily before breakfast. ) 90 tablet 1  . lidocaine-prilocaine (EMLA) cream Apply to affected area once (Patient taking differently: Apply 1 application topically daily as needed (prior to port is accessed.). ) 30 g 3  . losartan-hydrochlorothiazide (HYZAAR) 50-12.5 MG tablet TAKE 1 TABLET BY MOUTH EVERY DAY 90 tablet 1  . magic mouthwash w/lidocaine SOLN Take 5 mLs by mouth 4 (four) times daily as needed for mouth pain. Gargle and spit. 240 mL 2  . Multiple Vitamins-Minerals (MULTIVITAMINS THER. W/MINERALS) TABS Take 1 tablet by mouth daily.      . prochlorperazine (COMPAZINE) 10 MG tablet Take 1 tablet (10 mg total) by mouth every 6 (six) hours as needed (Nausea or vomiting). 30 tablet 1  . sertraline (ZOLOFT) 50 MG tablet TAKE 1 TABLET BY MOUTH EVERY DAY (Patient taking differently: Take 50 mg by mouth daily. ) 90 tablet 2  . SUMAtriptan (IMITREX) 100 MG tablet TAKE 1 TABLET EVERY DAY AS NEEDED FOR MIGRAINE, MAY REPEAT IN 24 HOURS (Patient taking differently: Take 100 mg by mouth every 2 (two) hours as needed for migraine. ) 9 tablet 2  . traMADol (ULTRAM) 50 MG tablet Take 1 tablet (50 mg total) by mouth every 6 (six) hours as needed. 30 tablet 0   No current facility-administered medications for this visit.     OBJECTIVE: Middle-aged white woman who appears stated age  80:   11/06/18 0938  BP: (!) 148/91  Resp: 18  Temp: 97.7 F (36.5 C)  SpO2: 100%     Body mass index is 39.05 kg/m.   Wt Readings from Last 3 Encounters:  11/06/18 213 lb 8 oz (96.8 kg)  10/23/18 210 lb 12.8 oz (95.6 kg)  10/22/18 211 lb (95.7 kg)  ECOG FS:1 - Symptomatic but completely ambulatory  Sclerae unicteric, EOMs intact Oropharynx clear and moist No cervical or supraclavicular adenopathy Lungs no rales or rhonchi Heart regular rate and rhythm Abd soft, nontender, positive bowel sounds MSK no focal spinal tenderness, no upper extremity lymphedema Neuro: nonfocal,  well oriented, appropriate affect Breasts: Right breast is unremarkable.  The left breast is status post lumpectomy.  There is no evidence of disease recurrence.  Both axillae are benign.   LAB RESULTS:  CMP     Component Value Date/Time   NA 141 10/30/2018 0913   K 3.8 10/30/2018 0913   CL 106 10/30/2018 0913   CO2 24 10/30/2018 0913   GLUCOSE 128 (H) 10/30/2018 0913   BUN 23 10/30/2018 0913   CREATININE 0.76 10/30/2018 0913   CREATININE 0.89 08/12/2018 1222   CALCIUM 9.2 10/30/2018 0913  PROT 6.7 10/30/2018 0913   ALBUMIN 3.6 10/30/2018 0913   AST 30 10/30/2018 0913   AST 14 (L) 08/12/2018 1222   ALT 62 (H) 10/30/2018 0913   ALT 14 08/12/2018 1222   ALKPHOS 80 10/30/2018 0913   BILITOT <0.2 (L) 10/30/2018 0913   BILITOT 0.2 (L) 08/12/2018 1222   GFRNONAA >60 10/30/2018 0913   GFRNONAA >60 08/12/2018 1222   GFRAA >60 10/30/2018 0913   GFRAA >60 08/12/2018 1222    No results found for: TOTALPROTELP, ALBUMINELP, A1GS, A2GS, BETS, BETA2SER, GAMS, MSPIKE, SPEI  No results found for: KPAFRELGTCHN, LAMBDASER, KAPLAMBRATIO  Lab Results  Component Value Date   WBC 6.0 11/06/2018   NEUTROABS 3.4 11/06/2018   HGB 11.4 (L) 11/06/2018   HCT 35.0 (L) 11/06/2018   MCV 89.3 11/06/2018   PLT 278 11/06/2018    @LASTCHEMISTRY @  No results found for: LABCA2  No components found for: LKJZPH150  No results for input(s): INR in the last 168 hours.  No results found for: LABCA2  No results found for: VWP794  No results found for: IAX655  No results found for: VZS827  No results found for: CA2729  No components found for: HGQUANT  No results found for: CEA1 / No results found for: CEA1   No results found for: AFPTUMOR  No results found for: CHROMOGRNA  No results found for: PSA1  Appointment on 11/06/2018  Component Date Value Ref Range Status  . WBC 11/06/2018 6.0  4.0 - 10.5 K/uL Final  . RBC 11/06/2018 3.92  3.87 - 5.11 MIL/uL Final  . Hemoglobin 11/06/2018  11.4* 12.0 - 15.0 g/dL Final  . HCT 11/06/2018 35.0* 36.0 - 46.0 % Final  . MCV 11/06/2018 89.3  80.0 - 100.0 fL Final  . MCH 11/06/2018 29.1  26.0 - 34.0 pg Final  . MCHC 11/06/2018 32.6  30.0 - 36.0 g/dL Final  . RDW 11/06/2018 13.3  11.5 - 15.5 % Final  . Platelets 11/06/2018 278  150 - 400 K/uL Final  . nRBC 11/06/2018 0.0  0.0 - 0.2 % Final  . Neutrophils Relative % 11/06/2018 56  % Final  . Neutro Abs 11/06/2018 3.4  1.7 - 7.7 K/uL Final  . Lymphocytes Relative 11/06/2018 28  % Final  . Lymphs Abs 11/06/2018 1.7  0.7 - 4.0 K/uL Final  . Monocytes Relative 11/06/2018 8  % Final  . Monocytes Absolute 11/06/2018 0.5  0.1 - 1.0 K/uL Final  . Eosinophils Relative 11/06/2018 3  % Final  . Eosinophils Absolute 11/06/2018 0.2  0.0 - 0.5 K/uL Final  . Basophils Relative 11/06/2018 1  % Final  . Basophils Absolute 11/06/2018 0.1  0.0 - 0.1 K/uL Final  . Immature Granulocytes 11/06/2018 4  % Final  . Abs Immature Granulocytes 11/06/2018 0.25* 0.00 - 0.07 K/uL Final   Performed at [facility], [place] [address] [place], [place] [address]    (this displays the last labs from the last 3 days)  No results found for: TOTALPROTELP, ALBUMINELP, A1GS, A2GS, BETS, BETA2SER, GAMS, MSPIKE, SPEI (this displays SPEP labs)  No results found for: KPAFRELGTCHN, LAMBDASER, KAPLAMBRATIO (kappa/lambda light chains)  No results found for: HGBA, HGBA2QUANT, HGBFQUANT, HGBSQUAN (Hemoglobinopathy evaluation)   No results found for: LDH  No results found for: IRON, TIBC, IRONPCTSAT (Iron and TIBC)  No results found for: FERRITIN  Urinalysis    Component Value Date/Time   COLORURINE YELLOW 09/13/2011 [place] 09/13/2011 0952   LABSPEC  1.010 09/13/2011 0952   PHURINE 6.5 09/13/2011 0952   GLUCOSEU NEGATIVE 09/13/2011 0952   HGBUR NEGATIVE 09/13/2011 0952   HGBUR negative 04/03/2009 0949   BILIRUBINUR n 09/15/2012 0832   KETONESUR NEGATIVE 09/13/2011 0952     PROTEINUR n 09/15/2012 0832   PROTEINUR NEGATIVE 09/13/2011 0952   UROBILINOGEN 0.2 09/15/2012 0832   UROBILINOGEN 0.2 09/13/2011 0952   NITRITE n 09/15/2012 0832   NITRITE NEGATIVE 09/13/2011 0952   LEUKOCYTESUR Negative 09/15/2012 0832     STUDIES:  Dg Cervical Spine 2-3 Views  Result Date: 10/23/2018 CLINICAL DATA:  Right-sided arm pain and tingling for 5 days. History of left breast cancer. EXAM: CERVICAL SPINE - 2-3 VIEW COMPARISON:  None. FINDINGS: The prevertebral soft tissues are normal. The alignment is anatomic through T1. There is no evidence of acute fracture or traumatic subluxation. The C1-2 articulation appears normal in the AP projection. There is multilevel spondylosis with disc space narrowing, uncinate spurring and facet hypertrophy. Spondylosis is greatest at C4-5, C5-6 and C6-7. No evidence of metastatic disease. Right-sided Port-A-Cath noted. IMPRESSION: Multilevel cervical spondylosis without evidence of acute osseous findings or metastatic disease. Electronically Signed   By: Richardean Sale M.D.   On: 10/23/2018 14:38   Dg Scapula Right  Result Date: 10/23/2018 CLINICAL DATA:  Shoulder pain and tingling for 5 days. No known injury. History of left breast cancer. EXAM: RIGHT SHOULDER - 2+ VIEW; RIGHT SCAPULA - 2+ VIEWS COMPARISON:  None. FINDINGS: The mineralization and alignment are normal. There is no evidence of acute fracture or dislocation. No evidence of metastatic disease. The joint spaces are preserved. The subacromial space is preserved. Small soft tissue calcification noted adjacent to the greater tuberosity on the AP view, possible hydroxyapatite deposition. Right IJ Port-A-Cath extends to the level of the superior cavoatrial junction. IMPRESSION: 1. Possible hydroxyapatite deposition adjacent to the greater tuberosity as can be seen with calcific rotator cuff tendinitis or bursitis. 2. No acute osseous findings, significant arthropathy or evidence of metastatic  disease. Electronically Signed   By: Richardean Sale M.D.   On: 10/23/2018 14:37   Dg Shoulder Right  Result Date: 10/23/2018 CLINICAL DATA:  Shoulder pain and tingling for 5 days. No known injury. History of left breast cancer. EXAM: RIGHT SHOULDER - 2+ VIEW; RIGHT SCAPULA - 2+ VIEWS COMPARISON:  None. FINDINGS: The mineralization and alignment are normal. There is no evidence of acute fracture or dislocation. No evidence of metastatic disease. The joint spaces are preserved. The subacromial space is preserved. Small soft tissue calcification noted adjacent to the greater tuberosity on the AP view, possible hydroxyapatite deposition. Right IJ Port-A-Cath extends to the level of the superior cavoatrial junction. IMPRESSION: 1. Possible hydroxyapatite deposition adjacent to the greater tuberosity as can be seen with calcific rotator cuff tendinitis or bursitis. 2. No acute osseous findings, significant arthropathy or evidence of metastatic disease. Electronically Signed   By: Richardean Sale M.D.   On: 10/23/2018 14:37   Vas [place] Upper Extremity Venous Duplex  Result Date: 10/22/2018 UPPER VENOUS STUDY  Indications: Pain Performing Technologist: Oliver Hum RVT  Examination Guidelines: A complete evaluation includes B-mode imaging, spectral Doppler, color Doppler, and power Doppler as needed of all accessible portions of each vessel. Bilateral testing is considered an integral part of a complete examination. Limited examinations for reoccurring indications may be performed as noted.  Right Findings: +----------+------------+----------+---------+-----------+-------+ RIGHT     CompressiblePropertiesPhasicitySpontaneousSummary +----------+------------+----------+---------+-----------+-------+ IJV           Full  Yes       Yes            +----------+------------+----------+---------+-----------+-------+ Subclavian    Full                 Yes       Yes             +----------+------------+----------+---------+-----------+-------+ Axillary      Full                 Yes       Yes            +----------+------------+----------+---------+-----------+-------+ Brachial      Full                 Yes       Yes            +----------+------------+----------+---------+-----------+-------+ Radial        Full                                          +----------+------------+----------+---------+-----------+-------+ Ulnar         Full                                          +----------+------------+----------+---------+-----------+-------+ Cephalic      Full                                          +----------+------------+----------+---------+-----------+-------+ Basilic       Full                                          +----------+------------+----------+---------+-----------+-------+  Left Findings: +----------+------------+----------+---------+-----------+-------+ LEFT      CompressiblePropertiesPhasicitySpontaneousSummary +----------+------------+----------+---------+-----------+-------+ Subclavian    Full                 Yes       Yes            +----------+------------+----------+---------+-----------+-------+  Summary:  Right: No evidence of deep vein thrombosis in the upper extremity. No evidence of superficial vein thrombosis in the upper extremity.  Left: No evidence of thrombosis in the subclavian.  *See table(s) above for measurements and observations.  Diagnosing physician: Servando Snare MD Electronically signed by Servando Snare MD on 10/22/2018 at 3:45:02 PM.    Final     ELIGIBLE FOR AVAILABLE RESEARCH PROTOCOL: no   ASSESSMENT: 63 y.o. [place] woman status post left breast lower inner quadrant biopsy 07/31/2018 for a clinical T1b N0, stage IA invasive ductal carcinoma, grade 3, triple positive, with an MIB-1 of 75%.  (1) status post left lumpectomy and sentinel lymph node sampling 09/07/2018 for a pT1c  pN0, stage IA invasive ductal carcinoma, grade 3, with negative margins.  (a) a total of 5 sentinel lymph nodes were removed  (2) adjuvant chemotherapy consisting of paclitaxel weekly x12 with trastuzumab every 21 days starting 10/02/2018  (3) trastuzumab to be continued to complete 1 year  (a) echocardiogram 08/17/2018 shows an ejection fraction of 60-65%  (4) adjuvant radiation as appropriate  (5) antiestrogens to start at the completion of local  treatment   PLAN: Tiffany Montes is tolerating the Taxol generally well and will proceed with her sixth cycle today.  She thought she had a total of 10 and she understands now that there are 12 treatments.  Of course we are continuing the trastuzumab for a full year.  She has developed a rash mostly over her scalp.  I suggested that we could do a Medrol Dosepak but she really does not want that because she is gaining weight and she does not want to get "jittery".  Otherwise we are proceeding with treatment today.  I am adding visits for the next several treatments to make sure neuropathy is not an issue  She knows to call for any other problems that may develop before her return here next week.Jana Hakim, Virgie Dad, MD  11/06/18 10:24 AM Medical Oncology and Hematology [facility] [address] [place], [place] [address] Tel. [phone]    Fax. [phone]  I, Jacqualyn Posey am acting as a Education administrator for Chauncey Cruel, MD.   I, Lurline Del MD, have reviewed the above documentation for accuracy and completeness, and I agree with the above.

## 2018-11-06 ENCOUNTER — Other Ambulatory Visit: Payer: BLUE CROSS/BLUE SHIELD

## 2018-11-06 ENCOUNTER — Inpatient Hospital Stay: Payer: BLUE CROSS/BLUE SHIELD

## 2018-11-06 ENCOUNTER — Telehealth: Payer: Self-pay | Admitting: Oncology

## 2018-11-06 ENCOUNTER — Inpatient Hospital Stay (HOSPITAL_BASED_OUTPATIENT_CLINIC_OR_DEPARTMENT_OTHER): Payer: BLUE CROSS/BLUE SHIELD | Admitting: Oncology

## 2018-11-06 VITALS — BP 148/91 | Temp 97.7°F | Resp 18 | Ht 62.0 in | Wt 213.5 lb

## 2018-11-06 VITALS — BP 127/88 | HR 83

## 2018-11-06 DIAGNOSIS — Z5111 Encounter for antineoplastic chemotherapy: Secondary | ICD-10-CM | POA: Diagnosis not present

## 2018-11-06 DIAGNOSIS — F329 Major depressive disorder, single episode, unspecified: Secondary | ICD-10-CM | POA: Diagnosis not present

## 2018-11-06 DIAGNOSIS — E039 Hypothyroidism, unspecified: Secondary | ICD-10-CM | POA: Diagnosis not present

## 2018-11-06 DIAGNOSIS — E785 Hyperlipidemia, unspecified: Secondary | ICD-10-CM | POA: Diagnosis not present

## 2018-11-06 DIAGNOSIS — Z170 Estrogen receptor positive status [ER+]: Secondary | ICD-10-CM

## 2018-11-06 DIAGNOSIS — L739 Follicular disorder, unspecified: Secondary | ICD-10-CM

## 2018-11-06 DIAGNOSIS — C50312 Malignant neoplasm of lower-inner quadrant of left female breast: Secondary | ICD-10-CM | POA: Diagnosis not present

## 2018-11-06 DIAGNOSIS — Z90710 Acquired absence of both cervix and uterus: Secondary | ICD-10-CM

## 2018-11-06 DIAGNOSIS — R21 Rash and other nonspecific skin eruption: Secondary | ICD-10-CM

## 2018-11-06 DIAGNOSIS — R410 Disorientation, unspecified: Secondary | ICD-10-CM

## 2018-11-06 DIAGNOSIS — C50212 Malignant neoplasm of upper-inner quadrant of left female breast: Secondary | ICD-10-CM

## 2018-11-06 DIAGNOSIS — Z79899 Other long term (current) drug therapy: Secondary | ICD-10-CM | POA: Diagnosis not present

## 2018-11-06 DIAGNOSIS — Z801 Family history of malignant neoplasm of trachea, bronchus and lung: Secondary | ICD-10-CM

## 2018-11-06 DIAGNOSIS — Z5112 Encounter for antineoplastic immunotherapy: Secondary | ICD-10-CM | POA: Diagnosis not present

## 2018-11-06 DIAGNOSIS — R232 Flushing: Secondary | ICD-10-CM | POA: Diagnosis not present

## 2018-11-06 DIAGNOSIS — I10 Essential (primary) hypertension: Secondary | ICD-10-CM

## 2018-11-06 DIAGNOSIS — R112 Nausea with vomiting, unspecified: Secondary | ICD-10-CM | POA: Diagnosis not present

## 2018-11-06 DIAGNOSIS — M542 Cervicalgia: Secondary | ICD-10-CM

## 2018-11-06 LAB — COMPREHENSIVE METABOLIC PANEL
ALT: 53 U/L — ABNORMAL HIGH (ref 0–44)
AST: 32 U/L (ref 15–41)
Albumin: 3.5 g/dL (ref 3.5–5.0)
Alkaline Phosphatase: 90 U/L (ref 38–126)
Anion gap: 10 (ref 5–15)
BUN: 16 mg/dL (ref 8–23)
CO2: 25 mmol/L (ref 22–32)
Calcium: 9.4 mg/dL (ref 8.9–10.3)
Chloride: 107 mmol/L (ref 98–111)
Creatinine, Ser: 0.81 mg/dL (ref 0.44–1.00)
GFR calc Af Amer: >60 mL/min (ref >60)
Glucose, Bld: 121 mg/dL — ABNORMAL HIGH (ref 70–99)
Potassium: 4.1 mmol/L (ref 3.5–5.1)
Sodium: 142 mmol/L (ref 135–145)
Total Bilirubin: <0.2 mg/dL — ABNORMAL LOW (ref 0.3–1.2)
Total Protein: 6.6 g/dL (ref 6.5–8.1)

## 2018-11-06 LAB — CBC WITH DIFFERENTIAL/PLATELET
Abs Immature Granulocytes: 0.25 10*3/uL — ABNORMAL HIGH (ref 0.00–0.07)
Basophils Absolute: 0.1 10*3/uL (ref 0.0–0.1)
Basophils Relative: 1 %
Eosinophils Absolute: 0.2 10*3/uL (ref 0.0–0.5)
Eosinophils Relative: 3 %
HCT: 35 % — ABNORMAL LOW (ref 36.0–46.0)
Hemoglobin: 11.4 g/dL — ABNORMAL LOW (ref 12.0–15.0)
Immature Granulocytes: 4 %
Lymphocytes Relative: 28 %
Lymphs Abs: 1.7 10*3/uL (ref 0.7–4.0)
MCH: 29.1 pg (ref 26.0–34.0)
MCHC: 32.6 g/dL (ref 30.0–36.0)
MCV: 89.3 fL (ref 80.0–100.0)
MONO ABS: 0.5 10*3/uL (ref 0.1–1.0)
Monocytes Relative: 8 %
Neutro Abs: 3.4 10*3/uL (ref 1.7–7.7)
Neutrophils Relative %: 56 %
Platelets: 278 10*3/uL (ref 150–400)
RBC: 3.92 MIL/uL (ref 3.87–5.11)
RDW: 13.3 % (ref 11.5–15.5)
WBC: 6 10*3/uL (ref 4.0–10.5)
nRBC: 0 % (ref 0.0–0.2)

## 2018-11-06 MED ORDER — HEPARIN SOD (PORK) LOCK FLUSH 100 UNIT/ML IV SOLN
500.0000 [IU] | Freq: Once | INTRAVENOUS | Status: AC | PRN
  Administered 2018-11-06: 500 [IU]
  Filled 2018-11-06: qty 5

## 2018-11-06 MED ORDER — SODIUM CHLORIDE 0.9% FLUSH
10.0000 mL | INTRAVENOUS | Status: DC | PRN
  Administered 2018-11-06: 10 mL
  Filled 2018-11-06: qty 10

## 2018-11-06 MED ORDER — DIPHENHYDRAMINE HCL 50 MG/ML IJ SOLN
25.0000 mg | Freq: Once | INTRAMUSCULAR | Status: AC
  Administered 2018-11-06: 25 mg via INTRAVENOUS

## 2018-11-06 MED ORDER — SODIUM CHLORIDE 0.9 % IV SOLN
80.0000 mg/m2 | Freq: Once | INTRAVENOUS | Status: AC
  Administered 2018-11-06: 162 mg via INTRAVENOUS
  Filled 2018-11-06: qty 27

## 2018-11-06 MED ORDER — FAMOTIDINE IN NACL 20-0.9 MG/50ML-% IV SOLN
INTRAVENOUS | Status: AC
  Filled 2018-11-06: qty 50

## 2018-11-06 MED ORDER — ONDANSETRON HCL 4 MG/2ML IJ SOLN
4.0000 mg | Freq: Once | INTRAMUSCULAR | Status: AC
  Administered 2018-11-06: 4 mg via INTRAVENOUS

## 2018-11-06 MED ORDER — DIPHENHYDRAMINE HCL 50 MG/ML IJ SOLN
INTRAMUSCULAR | Status: AC
  Filled 2018-11-06: qty 1

## 2018-11-06 MED ORDER — DEXAMETHASONE SODIUM PHOSPHATE 10 MG/ML IJ SOLN
4.0000 mg | Freq: Once | INTRAMUSCULAR | Status: AC
  Administered 2018-11-06: 4 mg via INTRAVENOUS

## 2018-11-06 MED ORDER — SODIUM CHLORIDE 0.9 % IV SOLN
Freq: Once | INTRAVENOUS | Status: AC
  Administered 2018-11-06: 11:00:00 via INTRAVENOUS
  Filled 2018-11-06: qty 250

## 2018-11-06 MED ORDER — FAMOTIDINE IN NACL 20-0.9 MG/50ML-% IV SOLN
20.0000 mg | Freq: Once | INTRAVENOUS | Status: AC
  Administered 2018-11-06: 20 mg via INTRAVENOUS

## 2018-11-06 MED ORDER — DEXAMETHASONE SODIUM PHOSPHATE 10 MG/ML IJ SOLN
INTRAMUSCULAR | Status: AC
  Filled 2018-11-06: qty 1

## 2018-11-06 MED ORDER — ONDANSETRON HCL 4 MG/2ML IJ SOLN
INTRAMUSCULAR | Status: AC
  Filled 2018-11-06: qty 2

## 2018-11-06 NOTE — Patient Instructions (Signed)

## 2018-11-06 NOTE — Patient Instructions (Signed)
[facility] Discharge Instructions for Patients Receiving Chemotherapy  Today you received the following chemotherapy agents: Paclitaxel (Taxol).  To help prevent nausea and vomiting after your treatment, we encourage you to take your nausea medication as directed.    If you develop nausea and vomiting that is not controlled by your nausea medication, call the clinic.   BELOW ARE SYMPTOMS THAT SHOULD BE REPORTED IMMEDIATELY:  *FEVER GREATER THAN 100.5 F  *CHILLS WITH OR WITHOUT FEVER  NAUSEA AND VOMITING THAT IS NOT CONTROLLED WITH YOUR NAUSEA MEDICATION  *UNUSUAL SHORTNESS OF BREATH  *UNUSUAL BRUISING OR BLEEDING  TENDERNESS IN MOUTH AND THROAT WITH OR WITHOUT PRESENCE OF ULCERS  *URINARY PROBLEMS  *BOWEL PROBLEMS  UNUSUAL RASH Items with * indicate a potential emergency and should be followed up as soon as possible.  Feel free to call the clinic should you have any questions or concerns. The clinic phone number is [phone].  Please show the CHEMO ALERT CARD at check-in to the Emergency Department and triage nurse.   

## 2018-11-06 NOTE — Telephone Encounter (Signed)
Gave avs and calendar ° °

## 2018-11-09 ENCOUNTER — Other Ambulatory Visit: Payer: Self-pay | Admitting: Family Medicine

## 2018-11-09 NOTE — Telephone Encounter (Signed)
Attempted to call patient to schedule an appointment for a refill for Adderall XR. No answer, left message to call and schedule.

## 2018-11-09 NOTE — Telephone Encounter (Signed)
Copied from [place] [id number]. Topic: Quick Communication - Rx Refill/Question >> Nov 09, 2018  2:26 PM [place], [place] D wrote: Medication: amphetamine-dextroamphetamine (ADDERALL XR) 30 MG 24 hr capsule  Has the patient contacted their pharmacy? Yes.   (Agent: If no, request that the patient contact the pharmacy for the refill.) (Agent: If yes, when and what did the pharmacy advise?)  Preferred Pharmacy (with phone number or street name): [id number] - [place], [place]. AT [address] [phone] (Phone) [phone] (Fax)  Agent: Please be advised that RX refills may take up to 3 business days. We ask that you follow-up with your pharmacy.

## 2018-11-10 ENCOUNTER — Other Ambulatory Visit: Payer: Self-pay | Admitting: Family Medicine

## 2018-11-11 ENCOUNTER — Ambulatory Visit: Payer: BLUE CROSS/BLUE SHIELD | Admitting: Family Medicine

## 2018-11-11 ENCOUNTER — Other Ambulatory Visit: Payer: Self-pay

## 2018-11-11 ENCOUNTER — Encounter: Payer: Self-pay | Admitting: Family Medicine

## 2018-11-11 VITALS — BP 108/74 | HR 95 | Temp 98.3°F | Ht 62.0 in | Wt 206.9 lb

## 2018-11-11 DIAGNOSIS — F988 Other specified behavioral and emotional disorders with onset usually occurring in childhood and adolescence: Secondary | ICD-10-CM

## 2018-11-11 DIAGNOSIS — I10 Essential (primary) hypertension: Secondary | ICD-10-CM

## 2018-11-11 DIAGNOSIS — J0110 Acute frontal sinusitis, unspecified: Secondary | ICD-10-CM | POA: Diagnosis not present

## 2018-11-11 MED ORDER — DOXYCYCLINE HYCLATE 100 MG PO CAPS: 100.0000 mg | ORAL_CAPSULE | Freq: Two times a day (BID) | ORAL | 0 refills | Status: DC

## 2018-11-11 MED ORDER — AMPHETAMINE-DEXTROAMPHET ER 30 MG PO CP24: ORAL_CAPSULE | ORAL | 0 refills | Status: DC

## 2018-11-11 NOTE — Telephone Encounter (Signed)
Patient saw Dr. Elease Hashimoto today.

## 2018-11-11 NOTE — Progress Notes (Signed)
Subjective:     Patient ID: Tiffany Montes, female   DOB: 12-Mar-1956, 63 y.o.   MRN: [id number]  HPI Patient is seen for acute sinus congestive symptoms.  She was just diagnosed recently with left breast cancer.  She is currently in the middle of chemotherapy and tolerating fairly well overall.  This will be followed by radiation therapy.  She has had some fatigue and occasional loose stools- as expected.  She relates about 2 to 3-week history of some sinus congestive symptoms which seem to be worsening and over the past week or so has noted increased bifrontal sinus pressure.  She has had some yellowish nasal discharge.  No bloody discharge.  No fevers or chills.  Rare cough.  No dyspnea.  No nausea or vomiting.  She has history of ADD.  She was requesting refills of Adderall.  She obviously is out of work now and will not likely go back until sometime after May.  She states that without Adderall she has difficulty sometimes with household functions such as writing bills.  She has hypertension and remains on losartan HCTZ.  Denies any significant dizziness.  No headaches.  No chest pain.  Past Medical History:  Diagnosis Date  . Anxiety   . Attention deficit disorder   . Cancer ([place])    Left breast CA  . Depression   . Gallstones 09/05/2011  . Hyperlipidemia   . Hypertension   . Hypothyroidism   . Migraines   . PONV (postoperative nausea and vomiting)    diffficulty waking up  . Thyroid disease    hypothyroid   Past Surgical History:  Procedure Laterality Date  . ABDOMINAL HYSTERECTOMY  2003  . BREAST BIOPSY Left 2014   benign  . BREAST LUMPECTOMY WITH RADIOACTIVE SEED AND SENTINEL LYMPH NODE BIOPSY Left 09/07/2018   Procedure: LEFT BREAST LUMPECTOMY WITH RADIOACTIVE SEED AND AXILLARY SENTINEL LYMPH NODE BIOPSY,INJECT BLUE DYE LEFT BREAST;  Surgeon: Fanny Skates, MD;  Location: [facility];  Service: General;  Laterality: Left;  . CHOLECYSTECTOMY  09/26/2011   Procedure:  LAPAROSCOPIC CHOLECYSTECTOMY WITH INTRAOPERATIVE CHOLANGIOGRAM;  Surgeon: Haywood Lasso, MD;  Location: [facility];  Service: General;  Laterality: N/A;  . COLONOSCOPY    . CYSTIC HYGROMA EXCISION    . DENTAL SURGERY     skin graft from top of mouth to lower gums  . PORTACATH PLACEMENT N/A 09/07/2018   Procedure: INSERTION PORT-A-CATH WITH [place];  Surgeon: Fanny Skates, MD;  Location: [facility];  Service: General;  Laterality: N/A;    reports that she has never smoked. She has never used smokeless tobacco. She reports current alcohol use. She reports that she does not use drugs. family history includes Cancer in her father; Diabetes in her father; Heart disease in her father. Allergies  Allergen Reactions  . Morphine Sulfate Nausea And Vomiting and Rash    GI upset     Review of Systems  Constitutional: Positive for fatigue. Negative for chills and fever.  HENT: Positive for congestion, sinus pressure, sinus pain and voice change. Negative for sore throat.   Respiratory: Negative for shortness of breath and wheezing.        Objective:   Physical Exam Constitutional:      Appearance: Normal appearance.  HENT:     Right Ear: Tympanic membrane normal.     Left Ear: Tympanic membrane normal.     Mouth/Throat:     Pharynx: Oropharynx is clear. No oropharyngeal exudate.  Neck:  Musculoskeletal: Neck supple.  Cardiovascular:     Rate and Rhythm: Normal rate.  Pulmonary:     Effort: Pulmonary effort is normal.     Breath sounds: Normal breath sounds. No wheezing or rales.  Neurological:     Mental Status: She is alert.        Assessment:     #1 acute frontal sinusitis.  Patient has had progressive symptoms over several weeks as above  #2 attention deficit disorder  #3 hypertension stable and at goal    Plan:     -Start doxycycline 100 mg twice daily for 10 days.  Take with food.  -We have advised that she try to hold Adderall as much as possible at this time especially  now that she is out of work.  We did agree to 1 refill to take sparingly as needed if she has significant household management chores that she is struggling to get through  Tiffany Post MD [facility]

## 2018-11-11 NOTE — Patient Instructions (Signed)
Sinusitis, Adult  Sinusitis is inflammation of your sinuses. Sinuses are hollow spaces in the bones around your face. Your sinuses are located:   Around your eyes.   In the middle of your forehead.   Behind your nose.   In your cheekbones.  Mucus normally drains out of your sinuses. When your nasal tissues become inflamed or swollen, mucus can become trapped or blocked. This allows bacteria, viruses, and fungi to grow, which leads to infection. Most infections of the sinuses are caused by a virus.  Sinusitis can develop quickly. It can last for up to 4 weeks (acute) or for more than 12 weeks (chronic). Sinusitis often develops after a cold.  What are the causes?  This condition is caused by anything that creates swelling in the sinuses or stops mucus from draining. This includes:   Allergies.   Asthma.   Infection from bacteria or viruses.   Deformities or blockages in your nose or sinuses.   Abnormal growths in the nose (nasal polyps).   Pollutants, such as chemicals or irritants in the air.   Infection from fungi (rare).  What increases the risk?  You are more likely to develop this condition if you:   Have a weak body defense system (immune system).   Do a lot of swimming or diving.   Overuse nasal sprays.   Smoke.  What are the signs or symptoms?  The main symptoms of this condition are pain and a feeling of pressure around the affected sinuses. Other symptoms include:   Stuffy nose or congestion.   Thick drainage from your nose.   Swelling and warmth over the affected sinuses.   Headache.   Upper toothache.   A cough that may get worse at night.   Extra mucus that collects in the throat or the back of the nose (postnasal drip).   Decreased sense of smell and taste.   Fatigue.   A fever.   Sore throat.   Bad breath.  How is this diagnosed?  This condition is diagnosed based on:   Your symptoms.   Your medical history.   A physical exam.   Tests to find out if your condition is  acute or chronic. This may include:  ? Checking your nose for nasal polyps.  ? Viewing your sinuses using a device that has a light (endoscope).  ? Testing for allergies or bacteria.  ? Imaging tests, such as an MRI or CT scan.  In rare cases, a bone biopsy may be done to rule out more serious types of fungal sinus disease.  How is this treated?  Treatment for sinusitis depends on the cause and whether your condition is chronic or acute.   If caused by a virus, your symptoms should go away on their own within 10 days. You may be given medicines to relieve symptoms. They include:  ? Medicines that shrink swollen nasal passages (topical intranasal decongestants).  ? Medicines that treat allergies (antihistamines).  ? A spray that eases inflammation of the nostrils (topical intranasal corticosteroids).  ? Rinses that help get rid of thick mucus in your nose (nasal saline washes).   If caused by bacteria, your health care provider may recommend waiting to see if your symptoms improve. Most bacterial infections will get better without antibiotic medicine. You may be given antibiotics if you have:  ? A severe infection.  ? A weak immune system.   If caused by narrow nasal passages or nasal polyps, you may need   to have surgery.  Follow these instructions at home:  Medicines   Take, use, or apply over-the-counter and prescription medicines only as told by your health care provider. These may include nasal sprays.   If you were prescribed an antibiotic medicine, take it as told by your health care provider. Do not stop taking the antibiotic even if you start to feel better.  Hydrate and humidify     Drink enough fluid to keep your urine pale yellow. Staying hydrated will help to thin your mucus.   Use a cool mist humidifier to keep the humidity level in your home above 50%.   Inhale steam for 10-15 minutes, 3-4 times a day, or as told by your health care provider. You can do this in the bathroom while a hot shower is  running.   Limit your exposure to cool or dry air.  Rest   Rest as much as possible.   Sleep with your head raised (elevated).   Make sure you get enough sleep each night.  General instructions     Apply a warm, moist washcloth to your face 3-4 times a day or as told by your health care provider. This will help with discomfort.   Wash your hands often with soap and water to reduce your exposure to germs. If soap and water are not available, use hand sanitizer.   Do not smoke. Avoid being around people who are smoking (secondhand smoke).   Keep all follow-up visits as told by your health care provider. This is important.  Contact a health care provider if:   You have a fever.   Your symptoms get worse.   Your symptoms do not improve within 10 days.  Get help right away if:   You have a severe headache.   You have persistent vomiting.   You have severe pain or swelling around your face or eyes.   You have vision problems.   You develop confusion.   Your neck is stiff.   You have trouble breathing.  Summary   Sinusitis is soreness and inflammation of your sinuses. Sinuses are hollow spaces in the bones around your face.   This condition is caused by nasal tissues that become inflamed or swollen. The swelling traps or blocks the flow of mucus. This allows bacteria, viruses, and fungi to grow, which leads to infection.   If you were prescribed an antibiotic medicine, take it as told by your health care provider. Do not stop taking the antibiotic even if you start to feel better.   Keep all follow-up visits as told by your health care provider. This is important.  This information is not intended to replace advice given to you by your health care provider. Make sure you discuss any questions you have with your health care provider.  Document Released: 09/23/2005 Document Revised: 02/23/2018 Document Reviewed: 02/23/2018  Elsevier Interactive Patient Education  2019 [employer]

## 2018-11-12 ENCOUNTER — Other Ambulatory Visit: Payer: Self-pay | Admitting: Adult Health

## 2018-11-12 ENCOUNTER — Telehealth: Payer: Self-pay | Admitting: *Deleted

## 2018-11-12 DIAGNOSIS — C50212 Malignant neoplasm of upper-inner quadrant of left female breast: Secondary | ICD-10-CM

## 2018-11-12 DIAGNOSIS — Z170 Estrogen receptor positive status [ER+]: Principal | ICD-10-CM

## 2018-11-12 NOTE — Progress Notes (Signed)
No show

## 2018-11-12 NOTE — Telephone Encounter (Signed)
Pt called with c/o sinus infection that is being treated by PCP. Started abx on 2/6. Pt scheduled to have chemo on 2/7/ Pt denies fever. C/o congestion, dry cough and "head hurts". Scheduled pt to see Dr.magrinat prior to chemo on 2/7/  Denies further needs at this time.

## 2018-11-12 NOTE — Telephone Encounter (Signed)
"  Tiffany Montes [phone]).  Would like someone to call me.  I'm a chemotherapy patient and I have a sinus infection."   Scheduled tomorrow (11-13-2018) for Taxol/Herceptin.

## 2018-11-13 ENCOUNTER — Inpatient Hospital Stay: Payer: BLUE CROSS/BLUE SHIELD | Admitting: Oncology

## 2018-11-13 ENCOUNTER — Inpatient Hospital Stay: Payer: BLUE CROSS/BLUE SHIELD

## 2018-11-13 ENCOUNTER — Telehealth: Payer: Self-pay | Admitting: *Deleted

## 2018-11-13 ENCOUNTER — Other Ambulatory Visit: Payer: Self-pay | Admitting: Family Medicine

## 2018-11-13 ENCOUNTER — Other Ambulatory Visit: Payer: BLUE CROSS/BLUE SHIELD

## 2018-11-13 NOTE — Telephone Encounter (Signed)
Called and spoke with patient about her missed appointment today for lab, Dr. Jana Hakim and chemo.  She states she called and spoke to a guy and told him she lost power and had an electric alarm clock and over slept.  She said she was told someone would call her back to reschedule her appointments.  Informed her it would be too soon in between chemo's for her to get treatment early next week and then again on Friday.  She started antibiotics yesterday for a sinus infection and she states she doesn't feel well.    I talked to the chemo room and said they had received a call from [facility] that patient's appointments were needing to be cancelled.  In the computer all appointments for 2/7 were cancelled except the appointment with Dr. Jana Hakim.    Informed patient per Dr. Jana Hakim the since patient was not feeling well, she can take this week off and resume treatment next week 2/14 as scheduled.  Patient verbalized understanding.

## 2018-11-17 ENCOUNTER — Ambulatory Visit (HOSPITAL_COMMUNITY)
Admission: RE | Admit: 2018-11-17 | Discharge: 2018-11-17 | Disposition: A | Discharge status: Home / Self Care | Payer: BLUE CROSS/BLUE SHIELD | Source: Ambulatory Visit | Attending: Adult Health | Admitting: Adult Health

## 2018-11-17 DIAGNOSIS — E079 Disorder of thyroid, unspecified: Secondary | ICD-10-CM | POA: Insufficient documentation

## 2018-11-17 DIAGNOSIS — I10 Essential (primary) hypertension: Secondary | ICD-10-CM | POA: Diagnosis not present

## 2018-11-17 DIAGNOSIS — C50212 Malignant neoplasm of upper-inner quadrant of left female breast: Secondary | ICD-10-CM | POA: Diagnosis not present

## 2018-11-17 DIAGNOSIS — E785 Hyperlipidemia, unspecified: Secondary | ICD-10-CM | POA: Insufficient documentation

## 2018-11-17 DIAGNOSIS — Z170 Estrogen receptor positive status [ER+]: Secondary | ICD-10-CM | POA: Insufficient documentation

## 2018-11-17 NOTE — Progress Notes (Signed)
  Echocardiogram 2D Echocardiogram has been performed.  Darlina Sicilian M 11/17/2018, 9:45 AM

## 2018-11-20 ENCOUNTER — Telehealth: Payer: Self-pay | Admitting: *Deleted

## 2018-11-20 ENCOUNTER — Other Ambulatory Visit: Payer: BLUE CROSS/BLUE SHIELD

## 2018-11-20 ENCOUNTER — Inpatient Hospital Stay: Payer: BLUE CROSS/BLUE SHIELD

## 2018-11-20 ENCOUNTER — Inpatient Hospital Stay: Payer: BLUE CROSS/BLUE SHIELD | Attending: Oncology

## 2018-11-20 ENCOUNTER — Inpatient Hospital Stay (HOSPITAL_BASED_OUTPATIENT_CLINIC_OR_DEPARTMENT_OTHER): Payer: BLUE CROSS/BLUE SHIELD | Admitting: Adult Health

## 2018-11-20 ENCOUNTER — Encounter: Payer: Self-pay | Admitting: Adult Health

## 2018-11-20 ENCOUNTER — Telehealth: Payer: Self-pay | Admitting: Adult Health

## 2018-11-20 VITALS — BP 148/70 | HR 89 | Temp 98.0°F | Resp 18 | Ht 62.0 in | Wt 215.1 lb

## 2018-11-20 DIAGNOSIS — Z79899 Other long term (current) drug therapy: Secondary | ICD-10-CM | POA: Insufficient documentation

## 2018-11-20 DIAGNOSIS — E039 Hypothyroidism, unspecified: Secondary | ICD-10-CM | POA: Diagnosis not present

## 2018-11-20 DIAGNOSIS — R232 Flushing: Secondary | ICD-10-CM | POA: Insufficient documentation

## 2018-11-20 DIAGNOSIS — C50312 Malignant neoplasm of lower-inner quadrant of left female breast: Secondary | ICD-10-CM | POA: Insufficient documentation

## 2018-11-20 DIAGNOSIS — C50212 Malignant neoplasm of upper-inner quadrant of left female breast: Secondary | ICD-10-CM

## 2018-11-20 DIAGNOSIS — Z170 Estrogen receptor positive status [ER+]: Secondary | ICD-10-CM | POA: Insufficient documentation

## 2018-11-20 DIAGNOSIS — F329 Major depressive disorder, single episode, unspecified: Secondary | ICD-10-CM

## 2018-11-20 DIAGNOSIS — I10 Essential (primary) hypertension: Secondary | ICD-10-CM | POA: Diagnosis not present

## 2018-11-20 DIAGNOSIS — Z5111 Encounter for antineoplastic chemotherapy: Secondary | ICD-10-CM | POA: Insufficient documentation

## 2018-11-20 DIAGNOSIS — Z5112 Encounter for antineoplastic immunotherapy: Secondary | ICD-10-CM | POA: Insufficient documentation

## 2018-11-20 DIAGNOSIS — E785 Hyperlipidemia, unspecified: Secondary | ICD-10-CM | POA: Diagnosis not present

## 2018-11-20 DIAGNOSIS — Z95828 Presence of other vascular implants and grafts: Secondary | ICD-10-CM

## 2018-11-20 LAB — COMPREHENSIVE METABOLIC PANEL
ALT: 24 U/L (ref 0–44)
AST: 21 U/L (ref 15–41)
Albumin: 3.5 g/dL (ref 3.5–5.0)
Alkaline Phosphatase: 109 U/L (ref 38–126)
Anion gap: 10 (ref 5–15)
BUN: 21 mg/dL (ref 8–23)
CO2: 25 mmol/L (ref 22–32)
Calcium: 9.5 mg/dL (ref 8.9–10.3)
Chloride: 106 mmol/L (ref 98–111)
Creatinine, Ser: 0.75 mg/dL (ref 0.44–1.00)
GFR calc Af Amer: >60 mL/min (ref >60)
GFR calc non Af Amer: >60 mL/min (ref >60)
Glucose, Bld: 96 mg/dL (ref 70–99)
Potassium: 3.8 mmol/L (ref 3.5–5.1)
Sodium: 141 mmol/L (ref 135–145)
TOTAL PROTEIN: 6.5 g/dL (ref 6.5–8.1)
Total Bilirubin: 0.3 mg/dL (ref 0.3–1.2)

## 2018-11-20 LAB — CBC WITH DIFFERENTIAL/PLATELET
Abs Immature Granulocytes: 0.14 10*3/uL — ABNORMAL HIGH (ref 0.00–0.07)
BASOS ABS: 0.1 10*3/uL (ref 0.0–0.1)
Basophils Relative: 1 %
Eosinophils Absolute: 0.3 10*3/uL (ref 0.0–0.5)
Eosinophils Relative: 4 %
HEMATOCRIT: 36.4 % (ref 36.0–46.0)
Hemoglobin: 11.5 g/dL — ABNORMAL LOW (ref 12.0–15.0)
IMMATURE GRANULOCYTES: 2 %
Lymphocytes Relative: 24 %
Lymphs Abs: 1.6 10*3/uL (ref 0.7–4.0)
MCH: 28 pg (ref 26.0–34.0)
MCHC: 31.6 g/dL (ref 30.0–36.0)
MCV: 88.6 fL (ref 80.0–100.0)
Monocytes Absolute: 0.9 10*3/uL (ref 0.1–1.0)
Monocytes Relative: 13 %
NEUTROS PCT: 56 %
Neutro Abs: 3.7 10*3/uL (ref 1.7–7.7)
Platelets: 227 10*3/uL (ref 150–400)
RBC: 4.11 MIL/uL (ref 3.87–5.11)
RDW: 13.8 % (ref 11.5–15.5)
WBC: 6.6 10*3/uL (ref 4.0–10.5)
nRBC: 0 % (ref 0.0–0.2)

## 2018-11-20 MED ORDER — SODIUM CHLORIDE 0.9 % IV SOLN
Freq: Once | INTRAVENOUS | Status: AC
  Administered 2018-11-20: 10:00:00 via INTRAVENOUS
  Filled 2018-11-20: qty 250

## 2018-11-20 MED ORDER — DIPHENHYDRAMINE HCL 25 MG PO CAPS: 25.0000 mg | ORAL_CAPSULE | Freq: Once | ORAL | Status: DC

## 2018-11-20 MED ORDER — TRASTUZUMAB CHEMO 150 MG IV SOLR
600.0000 mg | Freq: Once | INTRAVENOUS | Status: AC
  Administered 2018-11-20: 600 mg via INTRAVENOUS
  Filled 2018-11-20: qty 28.57

## 2018-11-20 MED ORDER — SODIUM CHLORIDE 0.9 % IV SOLN
80.0000 mg/m2 | Freq: Once | INTRAVENOUS | Status: AC
  Administered 2018-11-20: 162 mg via INTRAVENOUS
  Filled 2018-11-20: qty 27

## 2018-11-20 MED ORDER — FAMOTIDINE IN NACL 20-0.9 MG/50ML-% IV SOLN
20.0000 mg | Freq: Once | INTRAVENOUS | Status: AC
  Administered 2018-11-20: 20 mg via INTRAVENOUS

## 2018-11-20 MED ORDER — ONDANSETRON HCL 4 MG/2ML IJ SOLN
4.0000 mg | Freq: Once | INTRAMUSCULAR | Status: AC
  Administered 2018-11-20: 4 mg via INTRAVENOUS

## 2018-11-20 MED ORDER — HEPARIN SOD (PORK) LOCK FLUSH 100 UNIT/ML IV SOLN
500.0000 [IU] | Freq: Once | INTRAVENOUS | Status: AC | PRN
  Administered 2018-11-20: 500 [IU]
  Filled 2018-11-20: qty 5

## 2018-11-20 MED ORDER — SODIUM CHLORIDE 0.9% FLUSH
10.0000 mL | INTRAVENOUS | Status: DC | PRN
  Administered 2018-11-20: 10 mL
  Filled 2018-11-20: qty 10

## 2018-11-20 MED ORDER — ONDANSETRON HCL 4 MG/2ML IJ SOLN
INTRAMUSCULAR | Status: AC
  Filled 2018-11-20: qty 2

## 2018-11-20 MED ORDER — DEXAMETHASONE SODIUM PHOSPHATE 10 MG/ML IJ SOLN
4.0000 mg | Freq: Once | INTRAMUSCULAR | Status: AC
  Administered 2018-11-20: 4 mg via INTRAVENOUS

## 2018-11-20 MED ORDER — ACETAMINOPHEN 325 MG PO TABS
650.0000 mg | ORAL_TABLET | Freq: Once | ORAL | Status: AC
  Administered 2018-11-20: 650 mg via ORAL

## 2018-11-20 MED ORDER — ACETAMINOPHEN 325 MG PO TABS
ORAL_TABLET | ORAL | Status: AC
  Filled 2018-11-20: qty 2

## 2018-11-20 MED ORDER — DEXAMETHASONE SODIUM PHOSPHATE 10 MG/ML IJ SOLN
INTRAMUSCULAR | Status: AC
  Filled 2018-11-20: qty 1

## 2018-11-20 MED ORDER — FAMOTIDINE IN NACL 20-0.9 MG/50ML-% IV SOLN
INTRAVENOUS | Status: AC
  Filled 2018-11-20: qty 50

## 2018-11-20 MED ORDER — DIPHENHYDRAMINE HCL 50 MG/ML IJ SOLN
INTRAMUSCULAR | Status: AC
  Filled 2018-11-20: qty 1

## 2018-11-20 MED ORDER — DIPHENHYDRAMINE HCL 50 MG/ML IJ SOLN
25.0000 mg | Freq: Once | INTRAMUSCULAR | Status: AC
  Administered 2018-11-20: 25 mg via INTRAVENOUS

## 2018-11-20 NOTE — Patient Instructions (Signed)
Cancer Center Discharge Instructions for Patients Receiving Chemotherapy  Today you received the following chemotherapy agents :  Herceptin,  Taxol.  To help prevent nausea and vomiting after your treatment, we encourage you to take your nausea medication as prescribed.   If you develop nausea and vomiting that is not controlled by your nausea medication, call the clinic.   BELOW ARE SYMPTOMS THAT SHOULD BE REPORTED IMMEDIATELY:  *FEVER GREATER THAN 100.5 F  *CHILLS WITH OR WITHOUT FEVER  NAUSEA AND VOMITING THAT IS NOT CONTROLLED WITH YOUR NAUSEA MEDICATION  *UNUSUAL SHORTNESS OF BREATH  *UNUSUAL BRUISING OR BLEEDING  TENDERNESS IN MOUTH AND THROAT WITH OR WITHOUT PRESENCE OF ULCERS  *URINARY PROBLEMS  *BOWEL PROBLEMS  UNUSUAL RASH Items with * indicate a potential emergency and should be followed up as soon as possible.  Feel free to call the clinic should you have any questions or concerns. The clinic phone number is [phone].  Please show the CHEMO ALERT CARD at check-in to the Emergency Department and triage nurse.   

## 2018-11-20 NOTE — Patient Instructions (Signed)

## 2018-11-20 NOTE — Telephone Encounter (Signed)
No los °

## 2018-11-20 NOTE — Telephone Encounter (Signed)
Per MD review- pt should only get 25 mg benadryl with scheduled herceptin/taxol ( can be po or IV ).

## 2018-11-20 NOTE — Progress Notes (Signed)
[place]  [phone] [phone]     ID: Tiffany Montes DOB: 01/01/1956  MR#: [id number]  ZNB#:[id number]  Patient Care Team: Eulas Post, MD as PCP - General Fanny Skates, MD as Consulting Physician (General Surgery) Magrinat, Virgie Dad, MD as Consulting Physician (Oncology) Kyung Rudd, MD as Consulting Physician (Radiation Oncology) Maisie Fus, MD as Consulting Physician (Obstetrics and Gynecology) Larey Dresser, MD as Consulting Physician (Cardiology) OTHER MD:   CHIEF COMPLAINT: Estrogen receptor positive breast cancer, HER-2 amplified  CURRENT TREATMENT: Adjuvant paclitaxel/ trastuzumab   HISTORY OF CURRENT ILLNESS: From the original intake note:  "Tiffany Montes" had routine screening mammography on 07/22/2018 showing a possible abnormality in the left breast. She underwent unilateral diagnostic mammography with tomography and left breast ultrasonography at [facility] on 07/28/2018 showing: Breast density Category C; a 7 x 8 x 8 mm hypoechoic mass in the left breast lower inner quadrant, consistent with a complex cyst.. No abnormal lymph nodes in the left axilla.  Accordingly on 07/31/2018 she proceeded to biopsy of the left breast area in question. The pathology from this procedure showed ([id number]): Invasive Ductal Carcinoma, Grade 3. Prognostic indicators significant for: estrogen receptor, 95% positive and progesterone receptor, 85% positive, both with strong staining intensity. Proliferation marker Ki67 at 75%. HER2 positive by immunohistochemistry, 3+.   The patient's subsequent history is as detailed below.  INTERVAL HISTORY: Tiffany Montes returns today for follow-up and treatment of her estrogen receptor positive and HER2 amplified breast cancer.   She continues on weekly paclitaxel. Today is day 1 cycle 7. She has hot flashes.  She denies any peripheral neuropathy.    She also continues on trastuzumab received every  21 days. Today is day 1 cycle 3.    Tiffany Montes's last echocardiogram on 11/17/2018 shows a LVEF of 64-65%.     REVIEW OF SYSTEMS: Tiffany Montes is doing well today.  She was treated for a sinusitis last week.  She is completing doxycycline and today is her last day.  She is feeling better.  She is without fevers,chills, unusual headaches or vision changes.  She hasn't had chest pain, palpitations, cough, shortness of breath.  She is without bowel/bladder changes, nausea, or vomiting.  A detailed ROS was otherwise non contributory.     PAST MEDICAL HISTORY: Past Medical History:  Diagnosis Date  . Anxiety   . Attention deficit disorder   . Cancer ([place])    Left breast CA  . Depression   . Gallstones 09/05/2011  . Hyperlipidemia   . Hypertension   . Hypothyroidism   . Migraines   . PONV (postoperative nausea and vomiting)    diffficulty waking up  . Thyroid disease    hypothyroid    PAST SURGICAL HISTORY: Past Surgical History:  Procedure Laterality Date  . ABDOMINAL HYSTERECTOMY  2003  . BREAST BIOPSY Left 2014   benign  . BREAST LUMPECTOMY WITH RADIOACTIVE SEED AND SENTINEL LYMPH NODE BIOPSY Left 09/07/2018   Procedure: LEFT BREAST LUMPECTOMY WITH RADIOACTIVE SEED AND AXILLARY SENTINEL LYMPH NODE BIOPSY,INJECT BLUE DYE LEFT BREAST;  Surgeon: Fanny Skates, MD;  Location: [facility];  Service: General;  Laterality: Left;  . CHOLECYSTECTOMY  09/26/2011   Procedure: LAPAROSCOPIC CHOLECYSTECTOMY WITH INTRAOPERATIVE CHOLANGIOGRAM;  Surgeon: Haywood Lasso, MD;  Location: [facility];  Service: General;  Laterality: N/A;  . COLONOSCOPY    . CYSTIC HYGROMA EXCISION    . DENTAL SURGERY     skin graft from top of mouth to  lower gums  . PORTACATH PLACEMENT N/A 09/07/2018   Procedure: INSERTION PORT-A-CATH WITH [place];  Surgeon: Fanny Skates, MD;  Location: [facility] OR;  Service: General;  Laterality: N/A;    FAMILY HISTORY Family History  Problem Relation Age of Onset  . Diabetes Father   . Cancer Father         lung   . Heart disease Father    She notes that her father died from CHF at age 64.  He was diagnosed with lung cancer at age 64. Patients' mother is 64 years old as of November 2019. The patient has 1 brother and 2 sisters. Patient denies a family history of ovarian or breast cancer.   GYNECOLOGIC HISTORY:  No LMP recorded. Patient has had a hysterectomy. 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 Job is very stressful and schedule is  She is divorced and lives by herself with her 2 dogs. Her daughter [name] is a Dealer in [place], [name].  The patient's son, [name] is a Training and development officer and lives in [place]. The patient has 2 grandchildren and 1 great-grand child.  She is not a church or tender  ADVANCED DIRECTIVES: Her [place] is her daughter, [name], [phone].     HEALTH MAINTENANCE: Social History   Tobacco Use  . Smoking status: Never Smoker  . Smokeless tobacco: Never Used  Substance Use Topics  . Alcohol use: Yes    Comment: 1-2 a week and reports not every week  . Drug use: No     Colonoscopy: January 2013  PAP: 1 month ago  Bone density: Yes, 2 years ago   Allergies  Allergen Reactions  . Morphine Sulfate Nausea And Vomiting and Rash    GI upset    Current Outpatient Medications  Medication Sig Dispense Refill  . amphetamine-dextroamphetamine (ADDERALL XR) 30 MG 24 hr capsule Take one tablet by mouth daily. 30 capsule 0  . augmented betamethasone dipropionate (DIPROLENE-AF) 0.05 % cream Apply 1 application topically 2 (two) times daily as needed (for eczema). 30 g 2  . benzonatate (TESSALON) 200 MG capsule TAKE 1 CAPSULE AS NEEDED FOR ALLERGIES(COUGH) (Patient taking  differently: Take 200 mg by mouth daily as needed (allergies/cough). ) 60 capsule 0  . BIOTIN PO Take 1 tablet by mouth daily.    Marland Kitchen buPROPion (WELLBUTRIN XL) 300 MG 24 hr tablet TAKE 1 TABLET BY MOUTH EVERY DAY 90 tablet 0  . Cholecalciferol (VITAMIN D3 PO) Take 1 tablet by mouth daily.    . clonazePAM (KLONOPIN) 0.5 MG tablet TAKE 1 TABLET BY MOUTH AT BEDTIME AS NEEDED (Patient taking differently: Take 0.5 mg by mouth daily as needed (anxiety/panic attacks.). ) 30 tablet 5  . doxycycline (VIBRAMYCIN) 100 MG capsule Take 1 capsule (100 mg total) by mouth 2 (two) times daily. 20 capsule 0  . fluticasone (FLONASE) 50 MCG/ACT nasal spray Place 2 sprays into both nostrils daily as needed for allergies. 16 g 2  . gabapentin (NEURONTIN) 300 MG capsule Take 1 capsule (300 mg total) by mouth at bedtime. 90 capsule 4  . levothyroxine (SYNTHROID, LEVOTHROID) 125 MCG tablet TAKE 1 TABLET BY MOUTH EVERY DAY (Patient taking differently: Take 125 mcg by mouth daily before breakfast. )  90 tablet 1  . lidocaine-prilocaine (EMLA) cream Apply to affected area once (Patient taking differently: Apply 1 application topically daily as needed (prior to port is accessed.). ) 30 g 3  . losartan-hydrochlorothiazide (HYZAAR) 50-12.5 MG tablet TAKE 1 TABLET BY MOUTH EVERY DAY 90 tablet 1  . magic mouthwash w/lidocaine SOLN Take 5 mLs by mouth 4 (four) times daily as needed for mouth pain. Gargle and spit. 240 mL 2  . Multiple Vitamins-Minerals (MULTIVITAMINS THER. W/MINERALS) TABS Take 1 tablet by mouth daily.      . prochlorperazine (COMPAZINE) 10 MG tablet Take 1 tablet (10 mg total) by mouth every 6 (six) hours as needed (Nausea or vomiting). 30 tablet 1  . sertraline (ZOLOFT) 50 MG tablet TAKE 1 TABLET BY MOUTH EVERY DAY (Patient taking differently: Take 50 mg by mouth daily. ) 90 tablet 2  . SUMAtriptan (IMITREX) 100 MG tablet TAKE 1 TABLET BY MOUTH DAILY AS NEEDED FOR MIGRAINE. MAY REPEAT IN 24 HOURS. 9 tablet 2  .  traMADol (ULTRAM) 50 MG tablet Take 1 tablet (50 mg total) by mouth every 6 (six) hours as needed. 30 tablet 0   Current Facility-Administered Medications  Medication Dose Route Frequency Provider Last Rate Last Dose  . sodium chloride flush (NS) 0.9 % injection 10 mL  10 mL Intracatheter PRN Magrinat, Virgie Dad, MD   10 mL at 11/20/18 0903   Facility-Administered Medications Ordered in Other Visits  Medication Dose Route Frequency Provider Last Rate Last Dose  . diphenhydrAMINE (BENADRYL) capsule 25 mg  25 mg Oral Once Magrinat, Virgie Dad, MD      . sodium chloride flush (NS) 0.9 % injection 10 mL  10 mL Intracatheter PRN Magrinat, Virgie Dad, MD   10 mL at 11/20/18 1334    OBJECTIVE: Vitals:   11/20/18 0843  BP: (!) 148/70  Pulse: 89  Resp: 18  Temp: 98 F (36.7 C)  SpO2: 100%     Body mass index is 39.34 kg/m.   Wt Readings from Last 3 Encounters:  11/20/18 215 lb 1.6 oz (97.6 kg)  11/11/18 206 lb 14.4 oz (93.8 kg)  11/06/18 213 lb 8 oz (96.8 kg)  ECOG FS:1 - Symptomatic but completely ambulatory GENERAL: Patient is a well appearing female in no acute distress HEENT:  Sclerae anicteric.  Oropharynx clear and moist. No ulcerations or evidence of oropharyngeal candidiasis. Neck is supple.  NODES:  No cervical, supraclavicular, or axillary lymphadenopathy palpated.  BREAST EXAM:  Deferred. LUNGS:  Clear to auscultation bilaterally.  No wheezes or rhonchi. HEART:  Regular rate and rhythm. No murmur appreciated. ABDOMEN:  Soft, nontender.  Positive, normoactive bowel sounds. No organomegaly palpated. MSK:  No focal spinal tenderness to palpation. Full range of motion bilaterally in the upper extremities. EXTREMITIES:  No peripheral edema.   SKIN:  Clear with no obvious rashes or skin changes. No nail dyscrasia. NEURO:  Nonfocal. Well oriented.  Appropriate affect.     LAB RESULTS:  CMP     Component Value Date/Time   NA 141 11/20/2018 0900   K 3.8 11/20/2018 0900   CL 106  11/20/2018 0900   CO2 25 11/20/2018 0900   GLUCOSE 96 11/20/2018 0900   BUN 21 11/20/2018 0900   CREATININE 0.75 11/20/2018 0900   CREATININE 0.89 08/12/2018 1222   CALCIUM 9.5 11/20/2018 0900   PROT 6.5 11/20/2018 0900   ALBUMIN 3.5 11/20/2018 0900   AST 21 11/20/2018 0900   AST 14 (L) 08/12/2018 1222  ALT 24 11/20/2018 0900   ALT 14 08/12/2018 1222   ALKPHOS 109 11/20/2018 0900   BILITOT 0.3 11/20/2018 0900   BILITOT 0.2 (L) 08/12/2018 1222   GFRNONAA >60 11/20/2018 0900   GFRNONAA >60 08/12/2018 1222   GFRAA >60 11/20/2018 0900   GFRAA >60 08/12/2018 1222    No results found for: TOTALPROTELP, ALBUMINELP, A1GS, A2GS, BETS, BETA2SER, GAMS, MSPIKE, SPEI  No results found for: KPAFRELGTCHN, LAMBDASER, KAPLAMBRATIO  Lab Results  Component Value Date   WBC 6.6 11/20/2018   NEUTROABS 3.7 11/20/2018   HGB 11.5 (L) 11/20/2018   HCT 36.4 11/20/2018   MCV 88.6 11/20/2018   PLT 227 11/20/2018    @LASTCHEMISTRY @  No results found for: LABCA2  No components found for: [id number]  No results for input(s): INR in the last 168 hours.  No results found for: LABCA2  No results found for: DDU202  No results found for: RKY706  No results found for: CBJ628  No results found for: CA2729  No components found for: HGQUANT  No results found for: CEA1 / No results found for: CEA1   No results found for: AFPTUMOR  No results found for: CHROMOGRNA  No results found for: PSA1  Appointment on 11/20/2018  Component Date Value Ref Range Status  . WBC 11/20/2018 6.6  4.0 - 10.5 K/uL Final  . RBC 11/20/2018 4.11  3.87 - 5.11 MIL/uL Final  . Hemoglobin 11/20/2018 11.5* 12.0 - 15.0 g/dL Final  . HCT 11/20/2018 36.4  36.0 - 46.0 % Final  . MCV 11/20/2018 88.6  80.0 - 100.0 fL Final  . MCH 11/20/2018 28.0  26.0 - 34.0 pg Final  . MCHC 11/20/2018 31.6  30.0 - 36.0 g/dL Final  . RDW 11/20/2018 13.8  11.5 - 15.5 % Final  . Platelets 11/20/2018 227  150 - 400 K/uL Final  . nRBC  11/20/2018 0.0  0.0 - 0.2 % Final  . Neutrophils Relative % 11/20/2018 56  % Final  . Neutro Abs 11/20/2018 3.7  1.7 - 7.7 K/uL Final  . Lymphocytes Relative 11/20/2018 24  % Final  . Lymphs Abs 11/20/2018 1.6  0.7 - 4.0 K/uL Final  . Monocytes Relative 11/20/2018 13  % Final  . Monocytes Absolute 11/20/2018 0.9  0.1 - 1.0 K/uL Final  . Eosinophils Relative 11/20/2018 4  % Final  . Eosinophils Absolute 11/20/2018 0.3  0.0 - 0.5 K/uL Final  . Basophils Relative 11/20/2018 1  % Final  . Basophils Absolute 11/20/2018 0.1  0.0 - 0.1 K/uL Final  . Immature Granulocytes 11/20/2018 2  % Final  . Abs Immature Granulocytes 11/20/2018 0.14* 0.00 - 0.07 K/uL Final   Performed at [facility], [place] [address] [place], [place] [address]  . Sodium 11/20/2018 141  135 - 145 mmol/L Final  . Potassium 11/20/2018 3.8  3.5 - 5.1 mmol/L Final  . Chloride 11/20/2018 106  98 - 111 mmol/L Final  . CO2 11/20/2018 25  22 - 32 mmol/L Final  . Glucose, Bld 11/20/2018 96  70 - 99 mg/dL Final  . BUN 11/20/2018 21  8 - 23 mg/dL Final  . Creatinine, Ser 11/20/2018 0.75  0.44 - 1.00 mg/dL Final  . Calcium 11/20/2018 9.5  8.9 - 10.3 mg/dL Final  . Total Protein 11/20/2018 6.5  6.5 - 8.1 g/dL Final  . Albumin 11/20/2018 3.5  3.5 - 5.0 g/dL Final  . AST 11/20/2018 21  15 - 41 U/L Final  . ALT  11/20/2018 24  0 - 44 U/L Final  . Alkaline Phosphatase 11/20/2018 109  38 - 126 U/L Final  . Total Bilirubin 11/20/2018 0.3  0.3 - 1.2 mg/dL Final  . GFR calc non Af Amer 11/20/2018 >60  >60 mL/min Final  . GFR calc Af Amer 11/20/2018 >60  >60 mL/min Final  . Anion gap 11/20/2018 10  5 - 15 Final   Performed at [facility], [address] [place], [place] [address]    (this displays the last labs from the last 3 days)  No results found for: TOTALPROTELP, ALBUMINELP, A1GS, A2GS, BETS, BETA2SER, GAMS, MSPIKE, SPEI (this displays SPEP labs)  No results found for:  KPAFRELGTCHN, LAMBDASER, KAPLAMBRATIO (kappa/lambda light chains)  No results found for: HGBA, HGBA2QUANT, HGBFQUANT, HGBSQUAN (Hemoglobinopathy evaluation)   No results found for: LDH  No results found for: IRON, TIBC, IRONPCTSAT (Iron and TIBC)  No results found for: FERRITIN  Urinalysis    Component Value Date/Time   COLORURINE YELLOW 09/13/2011 [facility] 09/13/2011 0952   LABSPEC 1.010 09/13/2011 0952   PHURINE 6.5 09/13/2011 [facility] 09/13/2011 [facility] 09/13/2011 0952   HGBUR negative 04/03/2009 0949   BILIRUBINUR n 09/15/2012 [facility] 09/13/2011 0952   PROTEINUR n 09/15/2012 0832   PROTEINUR NEGATIVE 09/13/2011 0952   UROBILINOGEN 0.2 09/15/2012 0832   UROBILINOGEN 0.2 09/13/2011 0952   NITRITE n 09/15/2012 0832   NITRITE NEGATIVE 09/13/2011 0952   LEUKOCYTESUR Negative 09/15/2012 0832     STUDIES:  Dg Cervical Spine 2-3 Views  Result Date: 10/23/2018 CLINICAL DATA:  Right-sided arm pain and tingling for 5 days. History of left breast cancer. EXAM: CERVICAL SPINE - 2-3 VIEW COMPARISON:  None. FINDINGS: The prevertebral soft tissues are normal. The alignment is anatomic through T1. There is no evidence of acute fracture or traumatic subluxation. The C1-2 articulation appears normal in the AP projection. There is multilevel spondylosis with disc space narrowing, uncinate spurring and facet hypertrophy. Spondylosis is greatest at C4-5, C5-6 and C6-7. No evidence of metastatic disease. Right-sided Port-A-Cath noted. IMPRESSION: Multilevel cervical spondylosis without evidence of acute osseous findings or metastatic disease. Electronically Signed   By: Richardean Sale M.D.   On: 10/23/2018 14:38   Dg Scapula Right  Result Date: 10/23/2018 CLINICAL DATA:  Shoulder pain and tingling for 5 days. No known injury. History of left breast cancer. EXAM: RIGHT SHOULDER - 2+ VIEW; RIGHT SCAPULA - 2+ VIEWS COMPARISON:   None. FINDINGS: The mineralization and alignment are normal. There is no evidence of acute fracture or dislocation. No evidence of metastatic disease. The joint spaces are preserved. The subacromial space is preserved. Small soft tissue calcification noted adjacent to the greater tuberosity on the AP view, possible hydroxyapatite deposition. Right IJ Port-A-Cath extends to the level of the superior cavoatrial junction. IMPRESSION: 1. Possible hydroxyapatite deposition adjacent to the greater tuberosity as can be seen with calcific rotator cuff tendinitis or bursitis. 2. No acute osseous findings, significant arthropathy or evidence of metastatic disease. Electronically Signed   By: Richardean Sale M.D.   On: 10/23/2018 14:37   Dg Shoulder Right  Result Date: 10/23/2018 CLINICAL DATA:  Shoulder pain and tingling for 5 days. No known injury. History of left breast cancer. EXAM: RIGHT SHOULDER - 2+ VIEW; RIGHT SCAPULA - 2+ VIEWS COMPARISON:  None. FINDINGS: The mineralization and alignment are normal. There is no evidence of acute fracture or dislocation.  No evidence of metastatic disease. The joint spaces are preserved. The subacromial space is preserved. Small soft tissue calcification noted adjacent to the greater tuberosity on the AP view, possible hydroxyapatite deposition. Right IJ Port-A-Cath extends to the level of the superior cavoatrial junction. IMPRESSION: 1. Possible hydroxyapatite deposition adjacent to the greater tuberosity as can be seen with calcific rotator cuff tendinitis or bursitis. 2. No acute osseous findings, significant arthropathy or evidence of metastatic disease. Electronically Signed   By: Richardean Sale M.D.   On: 10/23/2018 14:37   Vas [place] Upper Extremity Venous Duplex  Result Date: 10/22/2018 UPPER VENOUS STUDY  Indications: Pain Performing Technologist: Oliver Hum RVT  Examination Guidelines: A complete evaluation includes B-mode imaging, spectral Doppler, color Doppler,  and power Doppler as needed of all accessible portions of each vessel. Bilateral testing is considered an integral part of a complete examination. Limited examinations for reoccurring indications may be performed as noted.  Right Findings: +----------+------------+----------+---------+-----------+-------+ RIGHT     CompressiblePropertiesPhasicitySpontaneousSummary +----------+------------+----------+---------+-----------+-------+ IJV           Full                 Yes       Yes            +----------+------------+----------+---------+-----------+-------+ Subclavian    Full                 Yes       Yes            +----------+------------+----------+---------+-----------+-------+ Axillary      Full                 Yes       Yes            +----------+------------+----------+---------+-----------+-------+ Brachial      Full                 Yes       Yes            +----------+------------+----------+---------+-----------+-------+ Radial        Full                                          +----------+------------+----------+---------+-----------+-------+ Ulnar         Full                                          +----------+------------+----------+---------+-----------+-------+ Cephalic      Full                                          +----------+------------+----------+---------+-----------+-------+ Basilic       Full                                          +----------+------------+----------+---------+-----------+-------+  Left Findings: +----------+------------+----------+---------+-----------+-------+ LEFT      CompressiblePropertiesPhasicitySpontaneousSummary +----------+------------+----------+---------+-----------+-------+ Subclavian    Full                 Yes       Yes            +----------+------------+----------+---------+-----------+-------+  Summary:  Right: No evidence of deep vein thrombosis in the upper extremity. No  evidence of superficial vein thrombosis in the upper extremity.  Left: No evidence of thrombosis in the subclavian.  *See table(s) above for measurements and observations.  Diagnosing physician: Servando Snare MD Electronically signed by Servando Snare MD on 10/22/2018 at 3:45:02 PM.    Final     ELIGIBLE FOR AVAILABLE RESEARCH PROTOCOL: no   ASSESSMENT: 63 y.o. [place] woman status post left breast lower inner quadrant biopsy 07/31/2018 for a clinical T1b N0, stage IA invasive ductal carcinoma, grade 3, triple positive, with an MIB-1 of 75%.  (1) status post left lumpectomy and sentinel lymph node sampling 09/07/2018 for a pT1c pN0, stage IA invasive ductal carcinoma, grade 3, with negative margins.  (a) a total of 5 sentinel lymph nodes were removed  (2) adjuvant chemotherapy consisting of paclitaxel weekly x12 with trastuzumab every 21 days starting 10/02/2018  (3) trastuzumab to be continued to complete 1 year  (a) echocardiogram 08/17/2018 shows an ejection fraction of 60-65%  (b) echocardiogram on 11/17/2018 shows an ejection fraction of 60-65%  (4) adjuvant radiation as appropriate  (5) antiestrogens to start at the completion of local treatment   PLAN: Tiffany Montes is doing well today.  She took a week off of chemotherapy last week due to sinusitis.  She is feeling better today.  Her labs are stable and she will proceed with treatment today with Paclitaxel and Trastuzumab.  She is tolerating this well, and has no sign of peripheral neuropathy at this point.    Tiffany Montes will return weekly for labs, f/u and her treatment.  She will see myself or Dr. Jana Hakim with every other treatment.  She knows to call for any questions or concerns prior to her next appointment with [place].  A total of (20) minutes of face-to-face time was spent with this patient with greater than 50% of that time in counseling and care-coordination.   Wilber Bihari, NP  11/20/18 3:23 PM Medical Oncology and Hematology [facility] [address] [place], [place] [address] Tel. [phone]    Fax. [phone]

## 2018-11-26 NOTE — Progress Notes (Signed)
[facility]  [phone] [phone]    ID: Tiffany Montes DOB: 13-Oct-1955  MR#: [id number]  VOJ#:[id number]  Patient Care Team: Eulas Post, MD as PCP - General Tiffany Skates, MD as Consulting Physician (General Surgery) , Virgie Dad, MD as Consulting Physician (Oncology) Kyung Rudd, MD as Consulting Physician (Radiation Oncology) Maisie Fus, MD as Consulting Physician (Obstetrics and Gynecology) Larey Dresser, MD as Consulting Physician (Cardiology) OTHER MD:    CHIEF COMPLAINT: Estrogen receptor positive breast cancer, HER-2 amplified  CURRENT TREATMENT: Adjuvant paclitaxel/ trastuzumab   HISTORY OF CURRENT ILLNESS: From the original intake note:  "Tiffany Montes" had routine screening mammography on 07/22/2018 showing a possible abnormality in the left breast. She underwent unilateral diagnostic mammography with tomography and left breast ultrasonography at [facility] on 07/28/2018 showing: Breast density Category C; a 7 x 8 x 8 mm hypoechoic mass in the left breast lower inner quadrant, consistent with a complex cyst.. No abnormal lymph nodes in the left axilla.  Accordingly on 07/31/2018 she proceeded to biopsy of the left breast area in question. The pathology from this procedure showed ([id number]): Invasive Ductal Carcinoma, Grade 3. Prognostic indicators significant for: estrogen receptor, 95% positive and progesterone receptor, 85% positive, both with strong staining intensity. Proliferation marker Ki67 at 75%. HER2 positive by immunohistochemistry, 3+.   The patient's subsequent history is as detailed below.   INTERVAL HISTORY: Tiffany Montes returns today for follow-up and treatment of her estrogen receptor positive and HER2 amplified breast cancer.   She continues on weekly paclitaxel. Today is day 1 cycle 8. She tolerates this well and without any noticeable side effects.    She also continues on trastuzumab. Today  is day 8 cycle 3. She tolerates this well and without any noticeable side effects.     Tiffany Montes's last echocardiogram on 11/17/2018, showed an ejection fraction in the 60% - 65% range.   Since her last visit here, she has not undergone any additional studies.     REVIEW OF SYSTEMS: Tiffany Montes says that she felt "shakey and weak" this week; she tried to stay active like clean her house and cook her meals however. She has learned to pace herself and take breaks when she feels like she needs them. She has some difficulty sleeping immediately following chemotherapy, and she "crashes" around the 3rd day. She has had no issues with her nail beds or with neuropathy. The patient denies unusual headaches, visual changes, nausea, vomiting, or dizziness. There has been no unusual cough, phlegm production, or pleurisy. This been no change in bowel or bladder habits. The patient denies unexplained fatigue or unexplained weight loss, bleeding, rash, or fever. A detailed review of systems was otherwise noncontributory.    PAST MEDICAL HISTORY: Past Medical History:  Diagnosis Date  . Anxiety   . Attention deficit disorder   . Cancer ([place])    Left breast CA  . Depression   . Gallstones 09/05/2011  . Hyperlipidemia   . Hypertension   . Hypothyroidism   . Migraines   . PONV (postoperative nausea and vomiting)    diffficulty waking up  . Thyroid disease    hypothyroid    PAST SURGICAL HISTORY: Past Surgical History:  Procedure Laterality Date  . ABDOMINAL HYSTERECTOMY  2003  . BREAST BIOPSY Left 2014   benign  . BREAST LUMPECTOMY WITH RADIOACTIVE SEED AND SENTINEL LYMPH NODE BIOPSY Left 09/07/2018   Procedure: LEFT BREAST LUMPECTOMY WITH RADIOACTIVE SEED AND AXILLARY SENTINEL LYMPH NODE  BIOPSY,INJECT BLUE DYE LEFT BREAST;  Surgeon: Tiffany Skates, MD;  Location: [facility];  Service: General;  Laterality: Left;  . CHOLECYSTECTOMY  09/26/2011   Procedure: LAPAROSCOPIC CHOLECYSTECTOMY WITH INTRAOPERATIVE  CHOLANGIOGRAM;  Surgeon: Haywood Lasso, MD;  Location: [facility];  Service: General;  Laterality: N/A;  . COLONOSCOPY    . CYSTIC HYGROMA EXCISION    . DENTAL SURGERY     skin graft from top of mouth to lower gums  . PORTACATH PLACEMENT N/A 09/07/2018   Procedure: INSERTION PORT-A-CATH WITH [place];  Surgeon: Tiffany Skates, MD;  Location: [facility];  Service: General;  Laterality: N/A;    FAMILY HISTORY: Family History  Problem Relation Age of Onset  . Diabetes Father   . Cancer Father        lung   . Heart disease Father    She notes that her father died from CHF at age 79.  He was diagnosed with lung cancer at age 103. Patients' mother is 49 years old as of November 2019. The patient has 1 brother and 2 sisters. Patient denies a family history of ovarian or breast cancer.   GYNECOLOGIC HISTORY:  No LMP recorded. Patient has had a hysterectomy. 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 Job is very stressful and schedule is  She is divorced and lives by herself with her 2 dogs. Her daughter Tiffany Montes is a Dealer in [place], [name].  The patient's son, Tiffany Montes is a Training and development officer and lives in [place]. The patient has 2 grandchildren and 1 great-grand child.  She is not a church or tender  ADVANCED DIRECTIVES: Her [place] is her daughter, Tiffany Montes, [phone].     HEALTH MAINTENANCE: Social History   Tobacco Use  . Smoking status: Never Smoker  . Smokeless tobacco: Never Used  Substance Use Topics  . Alcohol use: Yes    Comment: 1-2 a week and reports not every week  . Drug use: No    Colonoscopy: January 2013  PAP: 1 month ago  Bone density: Yes, 2 years ago   Allergies  Allergen Reactions  .  Morphine Sulfate Nausea And Vomiting and Rash    GI upset    Current Outpatient Medications  Medication Sig Dispense Refill  . amphetamine-dextroamphetamine (ADDERALL XR) 30 MG 24 hr capsule Take one tablet by mouth daily. 30 capsule 0  . augmented betamethasone dipropionate (DIPROLENE-AF) 0.05 % cream Apply 1 application topically 2 (two) times daily as needed (for eczema). 30 g 2  . benzonatate (TESSALON) 200 MG capsule TAKE 1 CAPSULE AS NEEDED FOR ALLERGIES(COUGH) (Patient taking differently: Take 200 mg by mouth daily as needed (allergies/cough). ) 60 capsule 0  . BIOTIN PO Take 1 tablet by mouth daily.    Marland Kitchen buPROPion (WELLBUTRIN XL) 300 MG 24 hr tablet TAKE 1 TABLET BY MOUTH EVERY DAY 90 tablet 0  . Cholecalciferol (VITAMIN D3 PO) Take 1 tablet by mouth daily.    . clonazePAM (KLONOPIN) 0.5 MG tablet TAKE 1 TABLET BY MOUTH AT BEDTIME AS NEEDED (Patient taking differently: Take 0.5 mg by mouth daily as needed (anxiety/panic attacks.). ) 30 tablet 5  . doxycycline (VIBRAMYCIN) 100 MG capsule Take 1 capsule (100 mg total) by mouth 2 (  two) times daily. 20 capsule 0  . fluticasone (FLONASE) 50 MCG/ACT nasal spray Place 2 sprays into both nostrils daily as needed for allergies. 16 g 2  . gabapentin (NEURONTIN) 300 MG capsule Take 1 capsule (300 mg total) by mouth at bedtime. 90 capsule 4  . levothyroxine (SYNTHROID, LEVOTHROID) 125 MCG tablet TAKE 1 TABLET BY MOUTH EVERY DAY (Patient taking differently: Take 125 mcg by mouth daily before breakfast. ) 90 tablet 1  . lidocaine-prilocaine (EMLA) cream Apply to affected area once (Patient taking differently: Apply 1 application topically daily as needed (prior to port is accessed.). ) 30 g 3  . losartan-hydrochlorothiazide (HYZAAR) 50-12.5 MG tablet TAKE 1 TABLET BY MOUTH EVERY DAY 90 tablet 1  . magic mouthwash w/lidocaine SOLN Take 5 mLs by mouth 4 (four) times daily as needed for mouth pain. Gargle and spit. 240 mL 2  . Multiple Vitamins-Minerals  (MULTIVITAMINS THER. W/MINERALS) TABS Take 1 tablet by mouth daily.      . prochlorperazine (COMPAZINE) 10 MG tablet Take 1 tablet (10 mg total) by mouth every 6 (six) hours as needed (Nausea or vomiting). 30 tablet 1  . sertraline (ZOLOFT) 50 MG tablet TAKE 1 TABLET BY MOUTH EVERY DAY (Patient taking differently: Take 50 mg by mouth daily. ) 90 tablet 2  . SUMAtriptan (IMITREX) 100 MG tablet TAKE 1 TABLET BY MOUTH DAILY AS NEEDED FOR MIGRAINE. MAY REPEAT IN 24 HOURS. 9 tablet 2  . traMADol (ULTRAM) 50 MG tablet Take 1 tablet (50 mg total) by mouth every 6 (six) hours as needed. 30 tablet 0   No current facility-administered medications for this visit.     OBJECTIVE: Middle-aged white woman in no acute distress Vitals:   11/27/18 0901  BP: 131/84  Pulse: 78  Resp: (!) 78  Temp: 98.3 F (36.8 C)  SpO2: 100%     Body mass index is 39.14 kg/m.   Wt Readings from Last 3 Encounters:  11/27/18 214 lb (97.1 kg)  11/20/18 215 lb 1.6 oz (97.6 kg)  11/11/18 206 lb 14.4 oz (93.8 kg)  ECOG FS:1 - Symptomatic but completely ambulatory  Sclerae unicteric, EOMs intact Oropharynx clear and moist No cervical or supraclavicular adenopathy Lungs no rales or rhonchi Heart regular rate and rhythm Abd soft, nontender, positive bowel sounds MSK no focal spinal tenderness, no upper extremity lymphedema Neuro: nonfocal, well oriented, appropriate affect Breasts: No masses noted in either breast.  Both axillae are benign     LAB RESULTS:  CMP     Component Value Date/Time   NA 141 11/20/2018 0900   K 3.8 11/20/2018 0900   CL 106 11/20/2018 0900   CO2 25 11/20/2018 0900   GLUCOSE 96 11/20/2018 0900   BUN 21 11/20/2018 0900   CREATININE 0.75 11/20/2018 0900   CREATININE 0.89 08/12/2018 1222   CALCIUM 9.5 11/20/2018 0900   PROT 6.5 11/20/2018 0900   ALBUMIN 3.5 11/20/2018 0900   AST 21 11/20/2018 0900   AST 14 (L) 08/12/2018 1222   ALT 24 11/20/2018 0900   ALT 14 08/12/2018 1222    ALKPHOS 109 11/20/2018 0900   BILITOT 0.3 11/20/2018 0900   BILITOT 0.2 (L) 08/12/2018 1222   GFRNONAA >60 11/20/2018 0900   GFRNONAA >60 08/12/2018 1222   GFRAA >60 11/20/2018 0900   GFRAA >60 08/12/2018 1222    No results found for: TOTALPROTELP, ALBUMINELP, A1GS, A2GS, BETS, BETA2SER, GAMS, MSPIKE, SPEI  No results found for: KPAFRELGTCHN, LAMBDASER, KAPLAMBRATIO  Lab Results  Component Value Date   WBC 6.4 11/27/2018   NEUTROABS 4.1 11/27/2018   HGB 11.2 (L) 11/27/2018   HCT 34.6 (L) 11/27/2018   MCV 88.7 11/27/2018   PLT 223 11/27/2018    @LASTCHEMISTRY @  No results found for: LABCA2  No components found for: VFIEPP295  No results for input(s): INR in the last 168 hours.  No results found for: LABCA2  No results found for: JOA416  No results found for: SAY301  No results found for: SWF093  No results found for: CA2729  No components found for: HGQUANT  No results found for: CEA1 / No results found for: CEA1   No results found for: AFPTUMOR  No results found for: CHROMOGRNA  No results found for: PSA1  Appointment on 11/27/2018  Component Date Value Ref Range Status  . WBC 11/27/2018 6.4  4.0 - 10.5 K/uL Final  . RBC 11/27/2018 3.90  3.87 - 5.11 MIL/uL Final  . Hemoglobin 11/27/2018 11.2* 12.0 - 15.0 g/dL Final  . HCT 11/27/2018 34.6* 36.0 - 46.0 % Final  . MCV 11/27/2018 88.7  80.0 - 100.0 fL Final  . MCH 11/27/2018 28.7  26.0 - 34.0 pg Final  . MCHC 11/27/2018 32.4  30.0 - 36.0 g/dL Final  . RDW 11/27/2018 13.8  11.5 - 15.5 % Final  . Platelets 11/27/2018 223  150 - 400 K/uL Final  . nRBC 11/27/2018 0.0  0.0 - 0.2 % Final  . Neutrophils Relative % 11/27/2018 65  % Final  . Neutro Abs 11/27/2018 4.1  1.7 - 7.7 K/uL Final  . Lymphocytes Relative 11/27/2018 21  % Final  . Lymphs Abs 11/27/2018 1.4  0.7 - 4.0 K/uL Final  . Monocytes Relative 11/27/2018 7  % Final  . Monocytes Absolute 11/27/2018 0.5  0.1 - 1.0 K/uL Final  . Eosinophils Relative  11/27/2018 4  % Final  . Eosinophils Absolute 11/27/2018 0.3  0.0 - 0.5 K/uL Final  . Basophils Relative 11/27/2018 1  % Final  . Basophils Absolute 11/27/2018 0.1  0.0 - 0.1 K/uL Final  . Immature Granulocytes 11/27/2018 2  % Final  . Abs Immature Granulocytes 11/27/2018 0.10* 0.00 - 0.07 K/uL Final   Performed at [facility], [address] [place], [place] [address]    (this displays the last labs from the last 3 days)  No results found for: TOTALPROTELP, ALBUMINELP, A1GS, A2GS, BETS, BETA2SER, GAMS, MSPIKE, SPEI (this displays SPEP labs)  No results found for: KPAFRELGTCHN, LAMBDASER, KAPLAMBRATIO (kappa/lambda light chains)  No results found for: HGBA, HGBA2QUANT, HGBFQUANT, HGBSQUAN (Hemoglobinopathy evaluation)   No results found for: LDH  No results found for: IRON, TIBC, IRONPCTSAT (Iron and TIBC)  No results found for: FERRITIN  Urinalysis    Component Value Date/Time   COLORURINE YELLOW 09/13/2011 [place] 09/13/2011 0952   LABSPEC 1.010 09/13/2011 0952   PHURINE 6.5 09/13/2011 [place] 09/13/2011 [place] 09/13/2011 0952   HGBUR negative 04/03/2009 0949   BILIRUBINUR n 09/15/2012 [place] 09/13/2011 0952   PROTEINUR n 09/15/2012 0832   PROTEINUR NEGATIVE 09/13/2011 0952   UROBILINOGEN 0.2 09/15/2012 0832   UROBILINOGEN 0.2 09/13/2011 0952   NITRITE n 09/15/2012 0832   NITRITE NEGATIVE 09/13/2011 0952   LEUKOCYTESUR Negative 09/15/2012 0832     STUDIES:  No results found.  ELIGIBLE FOR AVAILABLE RESEARCH PROTOCOL: no   ASSESSMENT: 63 y.o. [place] woman status post left breast  lower inner quadrant biopsy 07/31/2018 for a clinical T1b N0, stage IA invasive ductal carcinoma, grade 3, triple positive, with an MIB-1 of 75%.  (1) status post left lumpectomy and sentinel lymph node sampling 09/07/2018 for a pT1c pN0, stage IA invasive ductal carcinoma, grade 3, with  negative margins.  (a) a total of 5 sentinel lymph nodes were removed  (2) adjuvant chemotherapy consisting of paclitaxel weekly x12 with trastuzumab every 21 days starting 10/02/2018  (3) trastuzumab to be continued to complete 1 year  (a) echocardiogram 08/17/2018 shows an ejection fraction of 60-65%  (b) echocardiogram on 11/17/2018 shows an ejection fraction of 60-65%  (4) adjuvant radiation to follow-up  (5) antiestrogens to start at the completion of local treatment   PLAN: Tiffany Montes continues to tolerate her chemotherapy generally well.  She does have significant fatigue and even housework is difficult for her to complete.  Once we do complete the chemo she will continue on immunotherapy for a full year so she is going to be keeping her port that long.  She might be able to return to work once radiation starts  but it is true that towards the end of radiation patients to become more fatigued.  She tells me her supervisor is very understanding and they are holding her job for her until she is ready to get back  We are going to see her with each 1 of her next 4 paclitaxel treatments just to make sure no neuropathy develops  She knows to call for any other issues that may develop before the next visit.  , Virgie Dad, MD  11/27/18 9:18 AM Medical Oncology and Hematology [facility] [address] [place], [place] [address] Tel. [phone]    Fax. [phone]   I, Jacqualyn Posey am acting as a Education administrator for Chauncey Cruel, MD.   I, Lurline Del MD, have reviewed the above documentation for accuracy and completeness, and I agree with the above.

## 2018-11-27 ENCOUNTER — Other Ambulatory Visit: Payer: BLUE CROSS/BLUE SHIELD

## 2018-11-27 ENCOUNTER — Inpatient Hospital Stay (HOSPITAL_BASED_OUTPATIENT_CLINIC_OR_DEPARTMENT_OTHER): Payer: BLUE CROSS/BLUE SHIELD | Admitting: Oncology

## 2018-11-27 ENCOUNTER — Inpatient Hospital Stay: Payer: BLUE CROSS/BLUE SHIELD

## 2018-11-27 VITALS — BP 131/84 | HR 78 | Temp 98.3°F | Resp 78 | Ht 62.0 in | Wt 214.0 lb

## 2018-11-27 DIAGNOSIS — Z170 Estrogen receptor positive status [ER+]: Secondary | ICD-10-CM | POA: Diagnosis not present

## 2018-11-27 DIAGNOSIS — Z95828 Presence of other vascular implants and grafts: Secondary | ICD-10-CM

## 2018-11-27 DIAGNOSIS — E039 Hypothyroidism, unspecified: Secondary | ICD-10-CM

## 2018-11-27 DIAGNOSIS — Z5112 Encounter for antineoplastic immunotherapy: Secondary | ICD-10-CM | POA: Diagnosis not present

## 2018-11-27 DIAGNOSIS — Z5111 Encounter for antineoplastic chemotherapy: Secondary | ICD-10-CM | POA: Diagnosis not present

## 2018-11-27 DIAGNOSIS — F329 Major depressive disorder, single episode, unspecified: Secondary | ICD-10-CM | POA: Diagnosis not present

## 2018-11-27 DIAGNOSIS — C50312 Malignant neoplasm of lower-inner quadrant of left female breast: Secondary | ICD-10-CM

## 2018-11-27 DIAGNOSIS — Z79899 Other long term (current) drug therapy: Secondary | ICD-10-CM | POA: Diagnosis not present

## 2018-11-27 DIAGNOSIS — C50212 Malignant neoplasm of upper-inner quadrant of left female breast: Secondary | ICD-10-CM

## 2018-11-27 DIAGNOSIS — E785 Hyperlipidemia, unspecified: Secondary | ICD-10-CM | POA: Diagnosis not present

## 2018-11-27 DIAGNOSIS — R232 Flushing: Secondary | ICD-10-CM

## 2018-11-27 DIAGNOSIS — I10 Essential (primary) hypertension: Secondary | ICD-10-CM

## 2018-11-27 LAB — CBC WITH DIFFERENTIAL/PLATELET
Abs Immature Granulocytes: 0.1 10*3/uL — ABNORMAL HIGH (ref 0.00–0.07)
Basophils Absolute: 0.1 10*3/uL (ref 0.0–0.1)
Basophils Relative: 1 %
Eosinophils Absolute: 0.3 10*3/uL (ref 0.0–0.5)
Eosinophils Relative: 4 %
HCT: 34.6 % — ABNORMAL LOW (ref 36.0–46.0)
HEMOGLOBIN: 11.2 g/dL — AB (ref 12.0–15.0)
Immature Granulocytes: 2 %
Lymphocytes Relative: 21 %
Lymphs Abs: 1.4 10*3/uL (ref 0.7–4.0)
MCH: 28.7 pg (ref 26.0–34.0)
MCHC: 32.4 g/dL (ref 30.0–36.0)
MCV: 88.7 fL (ref 80.0–100.0)
MONO ABS: 0.5 10*3/uL (ref 0.1–1.0)
MONOS PCT: 7 %
Neutro Abs: 4.1 10*3/uL (ref 1.7–7.7)
Neutrophils Relative %: 65 %
Platelets: 223 10*3/uL (ref 150–400)
RBC: 3.9 MIL/uL (ref 3.87–5.11)
RDW: 13.8 % (ref 11.5–15.5)
WBC: 6.4 10*3/uL (ref 4.0–10.5)
nRBC: 0 % (ref 0.0–0.2)

## 2018-11-27 LAB — COMPREHENSIVE METABOLIC PANEL
ALT: 30 U/L (ref 0–44)
AST: 27 U/L (ref 15–41)
Albumin: 3.5 g/dL (ref 3.5–5.0)
Alkaline Phosphatase: 99 U/L (ref 38–126)
Anion gap: 11 (ref 5–15)
BUN: 19 mg/dL (ref 8–23)
CO2: 23 mmol/L (ref 22–32)
Calcium: 8.9 mg/dL (ref 8.9–10.3)
Chloride: 107 mmol/L (ref 98–111)
Creatinine, Ser: 0.77 mg/dL (ref 0.44–1.00)
GFR calc Af Amer: >60 mL/min (ref >60)
Glucose, Bld: 111 mg/dL — ABNORMAL HIGH (ref 70–99)
Potassium: 4 mmol/L (ref 3.5–5.1)
Sodium: 141 mmol/L (ref 135–145)
Total Bilirubin: 0.3 mg/dL (ref 0.3–1.2)
Total Protein: 6.6 g/dL (ref 6.5–8.1)

## 2018-11-27 MED ORDER — SODIUM CHLORIDE 0.9 % IV SOLN
Freq: Once | INTRAVENOUS | Status: AC
  Administered 2018-11-27: 10:00:00 via INTRAVENOUS
  Filled 2018-11-27: qty 250

## 2018-11-27 MED ORDER — FAMOTIDINE IN NACL 20-0.9 MG/50ML-% IV SOLN
INTRAVENOUS | Status: AC
  Filled 2018-11-27: qty 50

## 2018-11-27 MED ORDER — SODIUM CHLORIDE 0.9 % IV SOLN
20.0000 mg | Freq: Once | INTRAVENOUS | Status: AC
  Administered 2018-11-27: 20 mg via INTRAVENOUS
  Filled 2018-11-27: qty 2

## 2018-11-27 MED ORDER — SODIUM CHLORIDE 0.9% FLUSH
10.0000 mL | INTRAVENOUS | Status: DC | PRN
  Administered 2018-11-27: 10 mL
  Filled 2018-11-27: qty 10

## 2018-11-27 MED ORDER — DEXAMETHASONE SODIUM PHOSPHATE 10 MG/ML IJ SOLN
4.0000 mg | Freq: Once | INTRAMUSCULAR | Status: AC
  Administered 2018-11-27: 4 mg via INTRAVENOUS

## 2018-11-27 MED ORDER — DIPHENHYDRAMINE HCL 50 MG/ML IJ SOLN
INTRAMUSCULAR | Status: AC
  Filled 2018-11-27: qty 1

## 2018-11-27 MED ORDER — HEPARIN SOD (PORK) LOCK FLUSH 100 UNIT/ML IV SOLN
500.0000 [IU] | Freq: Once | INTRAVENOUS | Status: DC | PRN
  Filled 2018-11-27: qty 5

## 2018-11-27 MED ORDER — FAMOTIDINE IN NACL 20-0.9 MG/50ML-% IV SOLN: 20.0000 mg | Freq: Once | INTRAVENOUS | Status: DC

## 2018-11-27 MED ORDER — ONDANSETRON HCL 4 MG/2ML IJ SOLN
INTRAMUSCULAR | Status: AC
  Filled 2018-11-27: qty 2

## 2018-11-27 MED ORDER — DEXAMETHASONE SODIUM PHOSPHATE 10 MG/ML IJ SOLN
INTRAMUSCULAR | Status: AC
  Filled 2018-11-27: qty 1

## 2018-11-27 MED ORDER — SODIUM CHLORIDE 0.9 % IV SOLN
80.0000 mg/m2 | Freq: Once | INTRAVENOUS | Status: AC
  Administered 2018-11-27: 162 mg via INTRAVENOUS
  Filled 2018-11-27: qty 27

## 2018-11-27 MED ORDER — ONDANSETRON HCL 4 MG/2ML IJ SOLN
4.0000 mg | Freq: Once | INTRAMUSCULAR | Status: AC
  Administered 2018-11-27: 4 mg via INTRAVENOUS

## 2018-11-27 MED ORDER — DIPHENHYDRAMINE HCL 50 MG/ML IJ SOLN
25.0000 mg | Freq: Once | INTRAMUSCULAR | Status: AC
  Administered 2018-11-27: 25 mg via INTRAVENOUS

## 2018-11-27 NOTE — Patient Instructions (Signed)
[facility] Discharge Instructions for Patients Receiving Chemotherapy  Today you received the following chemotherapy agents: Paclitaxel (Taxol).  To help prevent nausea and vomiting after your treatment, we encourage you to take your nausea medication as directed.    If you develop nausea and vomiting that is not controlled by your nausea medication, call the clinic.   BELOW ARE SYMPTOMS THAT SHOULD BE REPORTED IMMEDIATELY:  *FEVER GREATER THAN 100.5 F  *CHILLS WITH OR WITHOUT FEVER  NAUSEA AND VOMITING THAT IS NOT CONTROLLED WITH YOUR NAUSEA MEDICATION  *UNUSUAL SHORTNESS OF BREATH  *UNUSUAL BRUISING OR BLEEDING  TENDERNESS IN MOUTH AND THROAT WITH OR WITHOUT PRESENCE OF ULCERS  *URINARY PROBLEMS  *BOWEL PROBLEMS  UNUSUAL RASH Items with * indicate a potential emergency and should be followed up as soon as possible.  Feel free to call the clinic should you have any questions or concerns. The clinic phone number is [phone].  Please show the CHEMO ALERT CARD at check-in to the Emergency Department and triage nurse.   

## 2018-11-30 ENCOUNTER — Telehealth: Payer: Self-pay | Admitting: Oncology

## 2018-11-30 NOTE — Telephone Encounter (Signed)
No los °

## 2018-12-03 NOTE — Progress Notes (Signed)
[place]  [phone] [phone]) [phone]    ID: Tiffany Montes DOB: 1956/07/30  MR#: [id number]  WUX#:[id number]  Patient Care Team: Eulas Post, MD as PCP - General Fanny Skates, MD as Consulting Physician (General Surgery) , Virgie Dad, MD as Consulting Physician (Oncology) Kyung Rudd, MD as Consulting Physician (Radiation Oncology) Maisie Fus, MD as Consulting Physician (Obstetrics and Gynecology) Larey Dresser, MD as Consulting Physician (Cardiology) OTHER MD:    CHIEF COMPLAINT: Estrogen receptor positive breast cancer, HER-2 amplified  CURRENT TREATMENT: Adjuvant paclitaxel/ trastuzumab   HISTORY OF CURRENT ILLNESS: From the original intake note:  "Tiffany Montes" had routine screening mammography on 07/22/2018 showing a possible abnormality in the left breast. She underwent unilateral diagnostic mammography with tomography and left breast ultrasonography at [facility] on 07/28/2018 showing: Breast density Category C; a 7 x 8 x 8 mm hypoechoic mass in the left breast lower inner quadrant, consistent with a complex cyst.. No abnormal lymph nodes in the left axilla.  Accordingly on 07/31/2018 she proceeded to biopsy of the left breast area in question. The pathology from this procedure showed ([id number]): Invasive Ductal Carcinoma, Grade 3. Prognostic indicators significant for: estrogen receptor, 95% positive and progesterone receptor, 85% positive, both with strong staining intensity. Proliferation marker Ki67 at 75%. HER2 positive by immunohistochemistry, 3+.   The patient's subsequent history is as detailed below.   INTERVAL HISTORY: Tiffany Montes returns today for follow-up and treatment of her estrogen receptor positive and HER2 amplified breast cancer.   She continues on weekly paclitaxel. Today is day 1 cycle 9.  She feels she tolerated the treatment last week better than the week before although she is not clear exactly  why.  She generally hydrates herself well.  Possibly she exercised a little more.  She also continues on trastuzumab. Today is day 15 cycle 3.  She has had no issues with these treatments.  Tiffany Montes's last echocardiogram on 11/17/2018, showed an ejection fraction in the 60% - 65% range.   She has already been contacted by radiation to consider adjuvant radiation treatments.  She is very concerned that she will not be able to return to work until after the radiation treatments have been completed.   REVIEW OF SYSTEMS: Tiffany Montes denies unusual headaches, visual changes, nausea, vomiting, or dizziness. There has been no unusual cough, phlegm production, or pleurisy. This been no change in bowel or bladder habits. The patient denies unexplained fatigue or unexplained weight loss, bleeding, rash, or fever.  There has been no evidence of peripheral neuropathy.  A detailed review of systems was otherwise noncontributory.     PAST MEDICAL HISTORY: Past Medical History:  Diagnosis Date  . Anxiety   . Attention deficit disorder   . Cancer ([place])    Left breast CA  . Depression   . Gallstones 09/05/2011  . Hyperlipidemia   . Hypertension   . Hypothyroidism   . Migraines   . PONV (postoperative nausea and vomiting)    diffficulty waking up  . Thyroid disease    hypothyroid    PAST SURGICAL HISTORY: Past Surgical History:  Procedure Laterality Date  . ABDOMINAL HYSTERECTOMY  2003  . BREAST BIOPSY Left 2014   benign  . BREAST LUMPECTOMY WITH RADIOACTIVE SEED AND SENTINEL LYMPH NODE BIOPSY Left 09/07/2018   Procedure: LEFT BREAST LUMPECTOMY WITH RADIOACTIVE SEED AND AXILLARY SENTINEL LYMPH NODE BIOPSY,INJECT BLUE DYE LEFT BREAST;  Surgeon: Fanny Skates, MD;  Location: [facility];  Service: General;  Laterality: Left;  . CHOLECYSTECTOMY  09/26/2011   Procedure: LAPAROSCOPIC CHOLECYSTECTOMY WITH INTRAOPERATIVE CHOLANGIOGRAM;  Surgeon: Haywood Lasso, MD;  Location: [facility];  Service: General;   Laterality: N/A;  . COLONOSCOPY    . CYSTIC HYGROMA EXCISION    . DENTAL SURGERY     skin graft from top of mouth to lower gums  . PORTACATH PLACEMENT N/A 09/07/2018   Procedure: INSERTION PORT-A-CATH WITH [place];  Surgeon: Fanny Skates, MD;  Location: [facility];  Service: General;  Laterality: N/A;    FAMILY HISTORY: Family History  Problem Relation Age of Onset  . Diabetes Father   . Cancer Father        lung   . Heart disease Father    She notes that her father died from CHF at age 29.  He was diagnosed with lung cancer at age 80. Patients' mother is 70 years old as of November 2019. The patient has 1 brother and 2 sisters. Patient denies a family history of ovarian or breast cancer.   GYNECOLOGIC HISTORY:  No LMP recorded. Patient has had a hysterectomy. 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 Job is very stressful and schedule is  She is divorced and lives by herself with her 2 dogs. Her daughter Tiffany Montes is a Dealer in [place], [name].  The patient's son, Tiffany Montes is a Training and development officer and lives in [place]. The patient has 2 grandchildren and 1 great-grand child.  She is not a church or tender  ADVANCED DIRECTIVES: Her Tiffany Montes is her daughter, Tiffany Montes, [phone].     HEALTH MAINTENANCE: Social History   Tobacco Use  . Smoking status: Never Smoker  . Smokeless tobacco: Never Used  Substance Use Topics  . Alcohol use: Yes    Comment: 1-2 a week and reports not every week  . Drug use: No    Colonoscopy: January 2013  PAP: 1 month ago  Bone density: Yes, 2 years ago   Allergies  Allergen Reactions  . Morphine Sulfate Nausea And Vomiting and Rash    GI upset    Current Outpatient  Medications  Medication Sig Dispense Refill  . amphetamine-dextroamphetamine (ADDERALL XR) 30 MG 24 hr capsule Take one tablet by mouth daily. 30 capsule 0  . augmented betamethasone dipropionate (DIPROLENE-AF) 0.05 % cream Apply 1 application topically 2 (two) times daily as needed (for eczema). 30 g 2  . benzonatate (TESSALON) 200 MG capsule TAKE 1 CAPSULE AS NEEDED FOR ALLERGIES(COUGH) (Patient taking differently: Take 200 mg by mouth daily as needed (allergies/cough). ) 60 capsule 0  . BIOTIN PO Take 1 tablet by mouth daily.    Marland Kitchen buPROPion (WELLBUTRIN XL) 300 MG 24 hr tablet TAKE 1 TABLET BY MOUTH EVERY DAY 90 tablet 0  . Cholecalciferol (VITAMIN D3 PO) Take 1 tablet by mouth daily.    . clonazePAM (KLONOPIN) 0.5 MG tablet TAKE 1 TABLET BY MOUTH AT BEDTIME AS NEEDED (Patient taking differently: Take 0.5 mg by mouth daily as needed (anxiety/panic attacks.). ) 30 tablet 5  . doxycycline (VIBRAMYCIN) 100 MG capsule Take 1 capsule (100 mg total) by mouth 2 (two) times daily. 20 capsule 0  . fluticasone (FLONASE) 50 MCG/ACT nasal spray Place 2 sprays into  both nostrils daily as needed for allergies. 16 g 2  . gabapentin (NEURONTIN) 300 MG capsule Take 1 capsule (300 mg total) by mouth at bedtime. 90 capsule 4  . levothyroxine (SYNTHROID, LEVOTHROID) 125 MCG tablet TAKE 1 TABLET BY MOUTH EVERY DAY (Patient taking differently: Take 125 mcg by mouth daily before breakfast. ) 90 tablet 1  . lidocaine-prilocaine (EMLA) cream Apply to affected area once (Patient taking differently: Apply 1 application topically daily as needed (prior to port is accessed.). ) 30 g 3  . losartan-hydrochlorothiazide (HYZAAR) 50-12.5 MG tablet TAKE 1 TABLET BY MOUTH EVERY DAY 90 tablet 1  . magic mouthwash w/lidocaine SOLN Take 5 mLs by mouth 4 (four) times daily as needed for mouth pain. Gargle and spit. 240 mL 2  . Multiple Vitamins-Minerals (MULTIVITAMINS THER. W/MINERALS) TABS Take 1 tablet by mouth daily.      .  prochlorperazine (COMPAZINE) 10 MG tablet Take 1 tablet (10 mg total) by mouth every 6 (six) hours as needed (Nausea or vomiting). 30 tablet 1  . sertraline (ZOLOFT) 50 MG tablet TAKE 1 TABLET BY MOUTH EVERY DAY (Patient taking differently: Take 50 mg by mouth daily. ) 90 tablet 2  . SUMAtriptan (IMITREX) 100 MG tablet TAKE 1 TABLET BY MOUTH DAILY AS NEEDED FOR MIGRAINE. MAY REPEAT IN 24 HOURS. 9 tablet 2  . traMADol (ULTRAM) 50 MG tablet Take 1 tablet (50 mg total) by mouth every 6 (six) hours as needed. 30 tablet 0   No current facility-administered medications for this visit.     OBJECTIVE: Middle-aged white woman who appears stated age  49:   12/04/18 0847  BP: 139/82  Pulse: 82  Resp: 18  Temp: (!) 97.5 F (36.4 C)  SpO2: 100%     Body mass index is 38.92 kg/m.   Wt Readings from Last 3 Encounters:  12/04/18 212 lb 12.8 oz (96.5 kg)  11/27/18 214 lb (97.1 kg)  11/20/18 215 lb 1.6 oz (97.6 kg)  ECOG FS:1 - Symptomatic but completely ambulatory  Sclerae unicteric, pupils round and equal Oropharynx clear and moist No cervical or supraclavicular adenopathy Lungs no rales or rhonchi Heart regular rate and rhythm Abd soft, nontender, positive bowel sounds MSK no focal spinal tenderness, no upper extremity lymphedema Neuro: nonfocal, well oriented, appropriate affect Breasts: The right breast is benign.  The left breast is status post lumpectomy.  There is no evidence of disease recurrence.  Both axillae are benign.  LAB RESULTS:  CMP     Component Value Date/Time   NA 141 11/27/2018 0855   K 4.0 11/27/2018 0855   CL 107 11/27/2018 0855   CO2 23 11/27/2018 0855   GLUCOSE 111 (H) 11/27/2018 0855   BUN 19 11/27/2018 0855   CREATININE 0.77 11/27/2018 0855   CREATININE 0.89 08/12/2018 1222   CALCIUM 8.9 11/27/2018 0855   PROT 6.6 11/27/2018 0855   ALBUMIN 3.5 11/27/2018 0855   AST 27 11/27/2018 0855   AST 14 (L) 08/12/2018 1222   ALT 30 11/27/2018 0855   ALT 14  08/12/2018 1222   ALKPHOS 99 11/27/2018 0855   BILITOT 0.3 11/27/2018 0855   BILITOT 0.2 (L) 08/12/2018 1222   GFRNONAA >60 11/27/2018 0855   GFRNONAA >60 08/12/2018 1222   GFRAA >60 11/27/2018 0855   GFRAA >60 08/12/2018 1222    No results found for: TOTALPROTELP, ALBUMINELP, A1GS, A2GS, BETS, BETA2SER, GAMS, MSPIKE, SPEI  No results found for: KPAFRELGTCHN, LAMBDASER, KAPLAMBRATIO  Lab Results  Component Value  Date   WBC 6.4 11/27/2018   NEUTROABS 4.1 11/27/2018   HGB 11.2 (L) 11/27/2018   HCT 34.6 (L) 11/27/2018   MCV 88.7 11/27/2018   PLT 223 11/27/2018    @LASTCHEMISTRY @  No results found for: LABCA2  No components found for: WIOXBD532  No results for input(s): INR in the last 168 hours.  No results found for: LABCA2  No results found for: DJM426  No results found for: STM196  No results found for: QIW979  No results found for: CA2729  No components found for: HGQUANT  No results found for: CEA1 / No results found for: CEA1   No results found for: AFPTUMOR  No results found for: CHROMOGRNA  No results found for: PSA1  No visits with results within 3 Day(s) from this visit.  Latest known visit with results is:  Appointment on 11/27/2018  Component Date Value Ref Range Status  . WBC 11/27/2018 6.4  4.0 - 10.5 K/uL Final  . RBC 11/27/2018 3.90  3.87 - 5.11 MIL/uL Final  . Hemoglobin 11/27/2018 11.2* 12.0 - 15.0 g/dL Final  . HCT 11/27/2018 34.6* 36.0 - 46.0 % Final  . MCV 11/27/2018 88.7  80.0 - 100.0 fL Final  . MCH 11/27/2018 28.7  26.0 - 34.0 pg Final  . MCHC 11/27/2018 32.4  30.0 - 36.0 g/dL Final  . RDW 11/27/2018 13.8  11.5 - 15.5 % Final  . Platelets 11/27/2018 223  150 - 400 K/uL Final  . nRBC 11/27/2018 0.0  0.0 - 0.2 % Final  . Neutrophils Relative % 11/27/2018 65  % Final  . Neutro Abs 11/27/2018 4.1  1.7 - 7.7 K/uL Final  . Lymphocytes Relative 11/27/2018 21  % Final  . Lymphs Abs 11/27/2018 1.4  0.7 - 4.0 K/uL Final  . Monocytes  Relative 11/27/2018 7  % Final  . Monocytes Absolute 11/27/2018 0.5  0.1 - 1.0 K/uL Final  . Eosinophils Relative 11/27/2018 4  % Final  . Eosinophils Absolute 11/27/2018 0.3  0.0 - 0.5 K/uL Final  . Basophils Relative 11/27/2018 1  % Final  . Basophils Absolute 11/27/2018 0.1  0.0 - 0.1 K/uL Final  . Immature Granulocytes 11/27/2018 2  % Final  . Abs Immature Granulocytes 11/27/2018 0.10* 0.00 - 0.07 K/uL Final   Performed at [facility], [place] [address], [place], [place] [address]  . Sodium 11/27/2018 141  135 - 145 mmol/L Final  . Potassium 11/27/2018 4.0  3.5 - 5.1 mmol/L Final  . Chloride 11/27/2018 107  98 - 111 mmol/L Final  . CO2 11/27/2018 23  22 - 32 mmol/L Final  . Glucose, Bld 11/27/2018 111* 70 - 99 mg/dL Final  . BUN 11/27/2018 19  8 - 23 mg/dL Final  . Creatinine, Ser 11/27/2018 0.77  0.44 - 1.00 mg/dL Final  . Calcium 11/27/2018 8.9  8.9 - 10.3 mg/dL Final  . Total Protein 11/27/2018 6.6  6.5 - 8.1 g/dL Final  . Albumin 11/27/2018 3.5  3.5 - 5.0 g/dL Final  . AST 11/27/2018 27  15 - 41 U/L Final  . ALT 11/27/2018 30  0 - 44 U/L Final  . Alkaline Phosphatase 11/27/2018 99  38 - 126 U/L Final  . Total Bilirubin 11/27/2018 0.3  0.3 - 1.2 mg/dL Final  . GFR calc non Af Amer 11/27/2018 >60  >60 mL/min Final  . GFR calc Af Amer 11/27/2018 >60  >60 mL/min Final  . Anion gap 11/27/2018 11  5 -  15 Final   Performed at [facility], [place] [address] [place], [place] [address]    (this displays the last labs from the last 3 days)  No results found for: TOTALPROTELP, ALBUMINELP, A1GS, A2GS, BETS, BETA2SER, GAMS, MSPIKE, SPEI (this displays SPEP labs)  No results found for: KPAFRELGTCHN, LAMBDASER, KAPLAMBRATIO (kappa/lambda light chains)  No results found for: HGBA, HGBA2QUANT, HGBFQUANT, HGBSQUAN (Hemoglobinopathy evaluation)   No results found for: LDH  No results found for: IRON, TIBC, IRONPCTSAT (Iron and TIBC)  No  results found for: FERRITIN  Urinalysis    Component Value Date/Time   COLORURINE YELLOW 09/13/2011 [place] 09/13/2011 0952   LABSPEC 1.010 09/13/2011 0952   PHURINE 6.5 09/13/2011 [place] 09/13/2011 [place] 09/13/2011 0952   HGBUR negative 04/03/2009 0949   BILIRUBINUR n 09/15/2012 [place] 09/13/2011 0952   PROTEINUR n 09/15/2012 0832   PROTEINUR NEGATIVE 09/13/2011 0952   UROBILINOGEN 0.2 09/15/2012 0832   UROBILINOGEN 0.2 09/13/2011 0952   NITRITE n 09/15/2012 0832   NITRITE NEGATIVE 09/13/2011 0952   LEUKOCYTESUR Negative 09/15/2012 0832     STUDIES:  No results found.   ELIGIBLE FOR AVAILABLE RESEARCH PROTOCOL: no   ASSESSMENT: 63 y.o. [place] woman status post left breast lower inner quadrant biopsy 07/31/2018 for a clinical T1b N0, stage IA invasive ductal carcinoma, grade 3, triple positive, with an MIB-1 of 75%.  (1) status post left lumpectomy and sentinel lymph node sampling 09/07/2018 for a pT1c pN0, stage IA invasive ductal carcinoma, grade 3, with negative margins.  (a) a total of 5 sentinel lymph nodes were removed  (2) adjuvant chemotherapy consisting of paclitaxel weekly x12 with trastuzumab every 21 days starting 10/02/2018  (3) trastuzumab to be continued to complete 1 year  (a) echocardiogram 08/17/2018 shows an ejection fraction of 60-65%  (b) echocardiogram on 11/17/2018 shows an ejection fraction of 60-65%  (4) adjuvant radiation to follow-up  (5) antiestrogens to start at the completion of local treatment   PLAN: Tiffany Montes will proceed to her ninth of 12 planned doses of weekly paclitaxel today.  She generally is tolerating these well and importantly there has been no evidence of peripheral neuropathy.  We are seeing her with each 1 of the last 4 treatments just to make sure there are no neuropathy or other new developments.  Of course she also receives trastuzumab every 3  weeks.  Her next dose will be a week from today.  She has had no problems with these and is being followed by cardio oncology.  For some reason her 12/18/2018 appointment had not been scheduled.  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  If she interrupts her disability she will lose it if she needs to take time off again later.  Accordingly it seems to make most sense for her to continue out of work until all her treatments are done.  This is particularly the case because she does have some "mental fog" which makes it hard for her to concentrate on more than one simple thing at a time  She knows to call for any other issue that may develop before the next visit.    , Virgie Dad, MD  12/04/18 9:05 AM Medical Oncology and Hematology [facility] [address]  [place], [place] [address] Tel. [phone]    Fax. [phone]   I, Jacqualyn Posey am acting as a Education administrator for Chauncey Cruel, MD.   I, Lurline Del MD, have reviewed the above documentation for accuracy and completeness, and I agree with the above.

## 2018-12-04 ENCOUNTER — Inpatient Hospital Stay: Payer: BLUE CROSS/BLUE SHIELD

## 2018-12-04 ENCOUNTER — Other Ambulatory Visit: Payer: BLUE CROSS/BLUE SHIELD

## 2018-12-04 ENCOUNTER — Inpatient Hospital Stay (HOSPITAL_BASED_OUTPATIENT_CLINIC_OR_DEPARTMENT_OTHER): Payer: BLUE CROSS/BLUE SHIELD | Admitting: Oncology

## 2018-12-04 VITALS — BP 139/82 | HR 82 | Temp 97.5°F | Resp 18 | Ht 62.0 in | Wt 212.8 lb

## 2018-12-04 DIAGNOSIS — C50312 Malignant neoplasm of lower-inner quadrant of left female breast: Secondary | ICD-10-CM | POA: Diagnosis not present

## 2018-12-04 DIAGNOSIS — Z170 Estrogen receptor positive status [ER+]: Principal | ICD-10-CM

## 2018-12-04 DIAGNOSIS — Z95828 Presence of other vascular implants and grafts: Secondary | ICD-10-CM

## 2018-12-04 DIAGNOSIS — F329 Major depressive disorder, single episode, unspecified: Secondary | ICD-10-CM

## 2018-12-04 DIAGNOSIS — E039 Hypothyroidism, unspecified: Secondary | ICD-10-CM

## 2018-12-04 DIAGNOSIS — Z5111 Encounter for antineoplastic chemotherapy: Secondary | ICD-10-CM | POA: Diagnosis not present

## 2018-12-04 DIAGNOSIS — C50212 Malignant neoplasm of upper-inner quadrant of left female breast: Secondary | ICD-10-CM

## 2018-12-04 DIAGNOSIS — Z5112 Encounter for antineoplastic immunotherapy: Secondary | ICD-10-CM

## 2018-12-04 DIAGNOSIS — R232 Flushing: Secondary | ICD-10-CM | POA: Diagnosis not present

## 2018-12-04 DIAGNOSIS — E785 Hyperlipidemia, unspecified: Secondary | ICD-10-CM | POA: Diagnosis not present

## 2018-12-04 DIAGNOSIS — I10 Essential (primary) hypertension: Secondary | ICD-10-CM | POA: Diagnosis not present

## 2018-12-04 DIAGNOSIS — Z79899 Other long term (current) drug therapy: Secondary | ICD-10-CM

## 2018-12-04 LAB — COMPREHENSIVE METABOLIC PANEL
ALK PHOS: 91 U/L (ref 38–126)
ALT: 30 U/L (ref 0–44)
ANION GAP: 9 (ref 5–15)
AST: 22 U/L (ref 15–41)
Albumin: 3.7 g/dL (ref 3.5–5.0)
BUN: 22 mg/dL (ref 8–23)
CO2: 24 mmol/L (ref 22–32)
Calcium: 9.4 mg/dL (ref 8.9–10.3)
Chloride: 106 mmol/L (ref 98–111)
Creatinine, Ser: 0.89 mg/dL (ref 0.44–1.00)
GFR calc Af Amer: >60 mL/min (ref >60)
GFR calc non Af Amer: >60 mL/min (ref >60)
Glucose, Bld: 114 mg/dL — ABNORMAL HIGH (ref 70–99)
Potassium: 4.2 mmol/L (ref 3.5–5.1)
Sodium: 139 mmol/L (ref 135–145)
Total Bilirubin: 0.3 mg/dL (ref 0.3–1.2)
Total Protein: 7 g/dL (ref 6.5–8.1)

## 2018-12-04 LAB — CBC WITH DIFFERENTIAL/PLATELET
ABS IMMATURE GRANULOCYTES: 0.15 10*3/uL — AB (ref 0.00–0.07)
Basophils Absolute: 0.1 10*3/uL (ref 0.0–0.1)
Basophils Relative: 1 %
Eosinophils Absolute: 0.3 10*3/uL (ref 0.0–0.5)
Eosinophils Relative: 6 %
HCT: 34.6 % — ABNORMAL LOW (ref 36.0–46.0)
HEMOGLOBIN: 11.2 g/dL — AB (ref 12.0–15.0)
Immature Granulocytes: 3 %
Lymphocytes Relative: 28 %
Lymphs Abs: 1.4 10*3/uL (ref 0.7–4.0)
MCH: 28.7 pg (ref 26.0–34.0)
MCHC: 32.4 g/dL (ref 30.0–36.0)
MCV: 88.7 fL (ref 80.0–100.0)
Monocytes Absolute: 0.5 10*3/uL (ref 0.1–1.0)
Monocytes Relative: 10 %
NEUTROS PCT: 52 %
Neutro Abs: 2.6 10*3/uL (ref 1.7–7.7)
Platelets: 242 10*3/uL (ref 150–400)
RBC: 3.9 MIL/uL (ref 3.87–5.11)
RDW: 14.2 % (ref 11.5–15.5)
WBC: 5 10*3/uL (ref 4.0–10.5)
nRBC: 0 % (ref 0.0–0.2)

## 2018-12-04 MED ORDER — SODIUM CHLORIDE 0.9% FLUSH
10.0000 mL | INTRAVENOUS | Status: DC | PRN
  Administered 2018-12-04: 10 mL
  Filled 2018-12-04: qty 10

## 2018-12-04 MED ORDER — DEXAMETHASONE SODIUM PHOSPHATE 10 MG/ML IJ SOLN
4.0000 mg | Freq: Once | INTRAMUSCULAR | Status: AC
  Administered 2018-12-04: 4 mg via INTRAVENOUS

## 2018-12-04 MED ORDER — SODIUM CHLORIDE 0.9 % IV SOLN
20.0000 mg | Freq: Once | INTRAVENOUS | Status: AC
  Administered 2018-12-04: 20 mg via INTRAVENOUS
  Filled 2018-12-04: qty 2

## 2018-12-04 MED ORDER — SODIUM CHLORIDE 0.9 % IV SOLN
Freq: Once | INTRAVENOUS | Status: AC
  Administered 2018-12-04: 10:00:00 via INTRAVENOUS
  Filled 2018-12-04: qty 250

## 2018-12-04 MED ORDER — HEPARIN SOD (PORK) LOCK FLUSH 100 UNIT/ML IV SOLN
500.0000 [IU] | Freq: Once | INTRAVENOUS | Status: AC | PRN
  Administered 2018-12-04: 500 [IU]
  Filled 2018-12-04: qty 5

## 2018-12-04 MED ORDER — DIPHENHYDRAMINE HCL 50 MG/ML IJ SOLN
INTRAMUSCULAR | Status: AC
  Filled 2018-12-04: qty 1

## 2018-12-04 MED ORDER — ONDANSETRON HCL 4 MG/2ML IJ SOLN
4.0000 mg | Freq: Once | INTRAMUSCULAR | Status: AC
  Administered 2018-12-04: 4 mg via INTRAVENOUS

## 2018-12-04 MED ORDER — DIPHENHYDRAMINE HCL 50 MG/ML IJ SOLN
25.0000 mg | Freq: Once | INTRAMUSCULAR | Status: AC
  Administered 2018-12-04: 25 mg via INTRAVENOUS

## 2018-12-04 MED ORDER — ONDANSETRON HCL 4 MG/2ML IJ SOLN
INTRAMUSCULAR | Status: AC
  Filled 2018-12-04: qty 2

## 2018-12-04 MED ORDER — SODIUM CHLORIDE 0.9 % IV SOLN
80.0000 mg/m2 | Freq: Once | INTRAVENOUS | Status: AC
  Administered 2018-12-04: 162 mg via INTRAVENOUS
  Filled 2018-12-04: qty 27

## 2018-12-04 MED ORDER — DEXAMETHASONE SODIUM PHOSPHATE 10 MG/ML IJ SOLN
INTRAMUSCULAR | Status: AC
  Filled 2018-12-04: qty 1

## 2018-12-04 MED ORDER — FAMOTIDINE IN NACL 20-0.9 MG/50ML-% IV SOLN: 20.0000 mg | Freq: Once | INTRAVENOUS | Status: DC

## 2018-12-04 NOTE — Patient Instructions (Signed)
[facility] Discharge Instructions for Patients Receiving Chemotherapy  Today you received the following chemotherapy agents: Paclitaxel (Taxol).  To help prevent nausea and vomiting after your treatment, we encourage you to take your nausea medication as directed.    If you develop nausea and vomiting that is not controlled by your nausea medication, call the clinic.   BELOW ARE SYMPTOMS THAT SHOULD BE REPORTED IMMEDIATELY:  *FEVER GREATER THAN 100.5 F  *CHILLS WITH OR WITHOUT FEVER  NAUSEA AND VOMITING THAT IS NOT CONTROLLED WITH YOUR NAUSEA MEDICATION  *UNUSUAL SHORTNESS OF BREATH  *UNUSUAL BRUISING OR BLEEDING  TENDERNESS IN MOUTH AND THROAT WITH OR WITHOUT PRESENCE OF ULCERS  *URINARY PROBLEMS  *BOWEL PROBLEMS  UNUSUAL RASH Items with * indicate a potential emergency and should be followed up as soon as possible.  Feel free to call the clinic should you have any questions or concerns. The clinic phone number is [phone].  Please show the CHEMO ALERT CARD at check-in to the Emergency Department and triage nurse.   

## 2018-12-11 ENCOUNTER — Encounter: Payer: Self-pay | Admitting: Adult Health

## 2018-12-11 ENCOUNTER — Inpatient Hospital Stay: Payer: BLUE CROSS/BLUE SHIELD

## 2018-12-11 ENCOUNTER — Inpatient Hospital Stay: Payer: BLUE CROSS/BLUE SHIELD | Attending: Oncology

## 2018-12-11 ENCOUNTER — Inpatient Hospital Stay (HOSPITAL_BASED_OUTPATIENT_CLINIC_OR_DEPARTMENT_OTHER): Payer: BLUE CROSS/BLUE SHIELD | Admitting: Adult Health

## 2018-12-11 VITALS — BP 153/87 | HR 95 | Temp 98.4°F | Resp 18 | Ht 62.0 in | Wt 215.6 lb

## 2018-12-11 DIAGNOSIS — I10 Essential (primary) hypertension: Secondary | ICD-10-CM

## 2018-12-11 DIAGNOSIS — C50212 Malignant neoplasm of upper-inner quadrant of left female breast: Secondary | ICD-10-CM

## 2018-12-11 DIAGNOSIS — F329 Major depressive disorder, single episode, unspecified: Secondary | ICD-10-CM | POA: Diagnosis not present

## 2018-12-11 DIAGNOSIS — Z170 Estrogen receptor positive status [ER+]: Principal | ICD-10-CM

## 2018-12-11 DIAGNOSIS — Z801 Family history of malignant neoplasm of trachea, bronchus and lung: Secondary | ICD-10-CM | POA: Diagnosis not present

## 2018-12-11 DIAGNOSIS — R5383 Other fatigue: Secondary | ICD-10-CM | POA: Insufficient documentation

## 2018-12-11 DIAGNOSIS — Z803 Family history of malignant neoplasm of breast: Secondary | ICD-10-CM

## 2018-12-11 DIAGNOSIS — Z90710 Acquired absence of both cervix and uterus: Secondary | ICD-10-CM | POA: Diagnosis not present

## 2018-12-11 DIAGNOSIS — Z90722 Acquired absence of ovaries, bilateral: Secondary | ICD-10-CM

## 2018-12-11 DIAGNOSIS — Z5111 Encounter for antineoplastic chemotherapy: Secondary | ICD-10-CM

## 2018-12-11 DIAGNOSIS — Z5112 Encounter for antineoplastic immunotherapy: Secondary | ICD-10-CM

## 2018-12-11 DIAGNOSIS — E039 Hypothyroidism, unspecified: Secondary | ICD-10-CM | POA: Diagnosis not present

## 2018-12-11 DIAGNOSIS — E785 Hyperlipidemia, unspecified: Secondary | ICD-10-CM

## 2018-12-11 DIAGNOSIS — C50312 Malignant neoplasm of lower-inner quadrant of left female breast: Secondary | ICD-10-CM | POA: Insufficient documentation

## 2018-12-11 DIAGNOSIS — Z79899 Other long term (current) drug therapy: Secondary | ICD-10-CM | POA: Diagnosis not present

## 2018-12-11 DIAGNOSIS — Z95828 Presence of other vascular implants and grafts: Secondary | ICD-10-CM

## 2018-12-11 LAB — CBC WITH DIFFERENTIAL/PLATELET
Abs Immature Granulocytes: 0.18 10*3/uL — ABNORMAL HIGH (ref 0.00–0.07)
Basophils Absolute: 0.1 10*3/uL (ref 0.0–0.1)
Basophils Relative: 1 %
Eosinophils Absolute: 0.2 10*3/uL (ref 0.0–0.5)
Eosinophils Relative: 4 %
HCT: 35.7 % — ABNORMAL LOW (ref 36.0–46.0)
Hemoglobin: 11.3 g/dL — ABNORMAL LOW (ref 12.0–15.0)
Immature Granulocytes: 3 %
Lymphocytes Relative: 31 %
Lymphs Abs: 1.8 10*3/uL (ref 0.7–4.0)
MCH: 28.5 pg (ref 26.0–34.0)
MCHC: 31.7 g/dL (ref 30.0–36.0)
MCV: 90.2 fL (ref 80.0–100.0)
MONOS PCT: 9 %
Monocytes Absolute: 0.5 10*3/uL (ref 0.1–1.0)
NEUTROS PCT: 52 %
Neutro Abs: 2.9 10*3/uL (ref 1.7–7.7)
Platelets: 309 10*3/uL (ref 150–400)
RBC: 3.96 MIL/uL (ref 3.87–5.11)
RDW: 14.5 % (ref 11.5–15.5)
WBC: 5.7 10*3/uL (ref 4.0–10.5)
nRBC: 0.4 % — ABNORMAL HIGH (ref 0.0–0.2)

## 2018-12-11 LAB — COMPREHENSIVE METABOLIC PANEL
ALT: 50 U/L — ABNORMAL HIGH (ref 0–44)
AST: 31 U/L (ref 15–41)
Albumin: 3.8 g/dL (ref 3.5–5.0)
Alkaline Phosphatase: 78 U/L (ref 38–126)
Anion gap: 9 (ref 5–15)
BUN: 27 mg/dL — ABNORMAL HIGH (ref 8–23)
CO2: 24 mmol/L (ref 22–32)
Calcium: 9 mg/dL (ref 8.9–10.3)
Chloride: 105 mmol/L (ref 98–111)
Creatinine, Ser: 0.91 mg/dL (ref 0.44–1.00)
GFR calc Af Amer: >60 mL/min (ref >60)
GFR calc non Af Amer: >60 mL/min (ref >60)
Glucose, Bld: 122 mg/dL — ABNORMAL HIGH (ref 70–99)
Potassium: 4 mmol/L (ref 3.5–5.1)
Sodium: 138 mmol/L (ref 135–145)
Total Bilirubin: 0.2 mg/dL — ABNORMAL LOW (ref 0.3–1.2)
Total Protein: 6.7 g/dL (ref 6.5–8.1)

## 2018-12-11 MED ORDER — TRASTUZUMAB CHEMO 150 MG IV SOLR
600.0000 mg | Freq: Once | INTRAVENOUS | Status: AC
  Administered 2018-12-11: 600 mg via INTRAVENOUS
  Filled 2018-12-11: qty 28.57

## 2018-12-11 MED ORDER — ONDANSETRON HCL 4 MG/2ML IJ SOLN
INTRAMUSCULAR | Status: AC
  Filled 2018-12-11: qty 2

## 2018-12-11 MED ORDER — SODIUM CHLORIDE 0.9% FLUSH
10.0000 mL | INTRAVENOUS | Status: DC | PRN
  Administered 2018-12-11: 10 mL
  Filled 2018-12-11: qty 10

## 2018-12-11 MED ORDER — DEXAMETHASONE SODIUM PHOSPHATE 10 MG/ML IJ SOLN
INTRAMUSCULAR | Status: AC
  Filled 2018-12-11: qty 1

## 2018-12-11 MED ORDER — DIPHENHYDRAMINE HCL 50 MG/ML IJ SOLN
INTRAMUSCULAR | Status: AC
  Filled 2018-12-11: qty 1

## 2018-12-11 MED ORDER — ACETAMINOPHEN 325 MG PO TABS
650.0000 mg | ORAL_TABLET | Freq: Once | ORAL | Status: AC
  Administered 2018-12-11: 650 mg via ORAL

## 2018-12-11 MED ORDER — DEXAMETHASONE SODIUM PHOSPHATE 10 MG/ML IJ SOLN
4.0000 mg | Freq: Once | INTRAMUSCULAR | Status: AC
  Administered 2018-12-11: 4 mg via INTRAVENOUS

## 2018-12-11 MED ORDER — DIPHENHYDRAMINE HCL 25 MG PO CAPS: 25.0000 mg | ORAL_CAPSULE | Freq: Once | ORAL | Status: DC

## 2018-12-11 MED ORDER — ACETAMINOPHEN 325 MG PO TABS
ORAL_TABLET | ORAL | Status: AC
  Filled 2018-12-11: qty 2

## 2018-12-11 MED ORDER — ONDANSETRON HCL 4 MG/2ML IJ SOLN
4.0000 mg | Freq: Once | INTRAMUSCULAR | Status: AC
  Administered 2018-12-11: 4 mg via INTRAVENOUS

## 2018-12-11 MED ORDER — DIPHENHYDRAMINE HCL 50 MG/ML IJ SOLN
25.0000 mg | Freq: Once | INTRAMUSCULAR | Status: AC
  Administered 2018-12-11: 25 mg via INTRAVENOUS

## 2018-12-11 MED ORDER — SODIUM CHLORIDE 0.9 % IV SOLN
Freq: Once | INTRAVENOUS | Status: AC
  Administered 2018-12-11: 10:00:00 via INTRAVENOUS
  Filled 2018-12-11: qty 250

## 2018-12-11 MED ORDER — FAMOTIDINE IN NACL 20-0.9 MG/50ML-% IV SOLN: 20.0000 mg | Freq: Once | INTRAVENOUS | Status: DC

## 2018-12-11 MED ORDER — SODIUM CHLORIDE 0.9 % IV SOLN
80.0000 mg/m2 | Freq: Once | INTRAVENOUS | Status: AC
  Administered 2018-12-11: 162 mg via INTRAVENOUS
  Filled 2018-12-11: qty 27

## 2018-12-11 MED ORDER — HEPARIN SOD (PORK) LOCK FLUSH 100 UNIT/ML IV SOLN
500.0000 [IU] | Freq: Once | INTRAVENOUS | Status: AC | PRN
  Administered 2018-12-11: 500 [IU]
  Filled 2018-12-11: qty 5

## 2018-12-11 MED ORDER — SODIUM CHLORIDE 0.9 % IV SOLN
20.0000 mg | Freq: Once | INTRAVENOUS | Status: AC
  Administered 2018-12-11: 20 mg via INTRAVENOUS
  Filled 2018-12-11: qty 2

## 2018-12-11 NOTE — Patient Instructions (Signed)
[facility] Discharge Instructions for Patients Receiving Chemotherapy  Today you received the following chemotherapy agents :  Herceptin,  Taxol.  To help prevent nausea and vomiting after your treatment, we encourage you to take your nausea medication as prescribed.   If you develop nausea and vomiting that is not controlled by your nausea medication, call the clinic.   BELOW ARE SYMPTOMS THAT SHOULD BE REPORTED IMMEDIATELY:  *FEVER GREATER THAN 100.5 F  *CHILLS WITH OR WITHOUT FEVER  NAUSEA AND VOMITING THAT IS NOT CONTROLLED WITH YOUR NAUSEA MEDICATION  *UNUSUAL SHORTNESS OF BREATH  *UNUSUAL BRUISING OR BLEEDING  TENDERNESS IN MOUTH AND THROAT WITH OR WITHOUT PRESENCE OF ULCERS  *URINARY PROBLEMS  *BOWEL PROBLEMS  UNUSUAL RASH Items with * indicate a potential emergency and should be followed up as soon as possible.  Feel free to call the clinic should you have any questions or concerns. The clinic phone number is [phone].  Please show the CHEMO ALERT CARD at check-in to the Emergency Department and triage nurse.   

## 2018-12-11 NOTE — Progress Notes (Signed)
Tiffany Montes  [phone] [phone]    ID: Tiffany Montes DOB: [birth year]  MR#: [id number]  KKX#:[id number]  Patient Care Team: Tiffany Post, MD as PCP - General Tiffany Skates, MD as Consulting Physician (General Surgery) Montes, Tiffany Dad, MD as Consulting Physician (Oncology) Tiffany Rudd, MD as Consulting Physician (Radiation Oncology) Tiffany Fus, MD as Consulting Physician (Obstetrics and Gynecology) Tiffany Dresser, MD as Consulting Physician (Cardiology) OTHER MD:    CHIEF COMPLAINT: Estrogen receptor positive breast cancer, HER-2 amplified  CURRENT TREATMENT: Adjuvant paclitaxel/ trastuzumab   HISTORY OF CURRENT ILLNESS: From the original intake note:  "Tiffany Montes" had routine screening mammography on 07/22/2018 showing a possible abnormality in the left breast. She underwent unilateral diagnostic mammography with tomography and left breast ultrasonography at [facility] on 07/28/2018 showing: Breast density Category C; a 7 x 8 x 8 mm hypoechoic mass in the left breast lower inner quadrant, consistent with a complex cyst.. No abnormal lymph nodes in the left axilla.  Accordingly on 07/31/2018 she proceeded to biopsy of the left breast area in question. The pathology from this procedure showed ([id number]): Invasive Ductal Carcinoma, Grade 3. Prognostic indicators significant for: estrogen receptor, 95% positive and progesterone receptor, 85% positive, both with strong staining intensity. Proliferation marker Ki67 at 75%. HER2 positive by immunohistochemistry, 3+.   The patient's subsequent history is as detailed below.   INTERVAL HISTORY: Tiffany Montes returns today for follow-up and treatment of her estrogen receptor positive and HER2 amplified breast cancer.   She continues on weekly paclitaxel. Today is day 1 cycle 10.    She also continues on trastuzumab. Today is day 11 cycle 4.  She has had no issues with these  treatments.  Tiffany Montes's last echocardiogram on 11/17/2018, showed an ejection fraction in the 60% - 65% range.   She is fatigued, but otherwise feeling well.  REVIEW OF SYSTEMS: Tiffany Montes denies peripheral neuropathy.  She does note an "on edge" feeling the day of her treatment and the following day, but says that it improves once the Dexamethasone IV is out of her system.    She is delighted that she is nearing the completion of her treatment.  She is still having some difficulty deciding what to do about work.  She knows that she is not yet back at 100%, and feels like she is about half that with her level of fatigue, and the amount of physical endurance it takes to fulfill all of her job requirements.  She is going to look into getting information from her work to extend her disability through the middle to end of May when she completes her radiation treatments.  Tiffany Montes is tearful today due to the level of fatigue and changes that she has undergone with her treatment.  She says that she has had a difficult time with getting through everything, but that she is blessed for people who have supported her along the way.    Tiffany Montes denies any unusual headaches, vision changes, fevers, chills.  She hasn't noted bowel/bladder changes, nausea, vomiting.  She denies cough, shortness of breath, chest pain, or palpitations.  A detailed ROS was otherwise non contributory.      PAST MEDICAL HISTORY: Past Medical History:  Diagnosis Date  . Anxiety   . Attention deficit disorder   . Cancer ([place])    Left breast CA  . Depression   . Gallstones 09/05/2011  . Hyperlipidemia   . Hypertension   . Hypothyroidism   .  Migraines   . PONV (postoperative nausea and vomiting)    diffficulty waking up  . Thyroid disease    hypothyroid    PAST SURGICAL HISTORY: Past Surgical History:  Procedure Laterality Date  . ABDOMINAL HYSTERECTOMY  2003  . BREAST BIOPSY Left 2014   benign  . BREAST LUMPECTOMY  WITH RADIOACTIVE SEED AND SENTINEL LYMPH NODE BIOPSY Left 09/07/2018   Procedure: LEFT BREAST LUMPECTOMY WITH RADIOACTIVE SEED AND AXILLARY SENTINEL LYMPH NODE BIOPSY,INJECT BLUE DYE LEFT BREAST;  Surgeon: Tiffany Skates, MD;  Location: [facility];  Service: General;  Laterality: Left;  . CHOLECYSTECTOMY  09/26/2011   Procedure: LAPAROSCOPIC CHOLECYSTECTOMY WITH INTRAOPERATIVE CHOLANGIOGRAM;  Surgeon: Haywood Lasso, MD;  Location: [facility];  Service: General;  Laterality: N/A;  . COLONOSCOPY    . CYSTIC HYGROMA EXCISION    . DENTAL SURGERY     skin graft from top of mouth to lower gums  . PORTACATH PLACEMENT N/A 09/07/2018   Procedure: INSERTION PORT-A-CATH WITH [place];  Surgeon: Tiffany Skates, MD;  Location: [facility];  Service: General;  Laterality: N/A;    FAMILY HISTORY: Family History  Problem Relation Age of Onset  . Diabetes Father   . Cancer Father        lung   . Heart disease Father    She notes that her father died from CHF at age 26.  He was diagnosed with lung cancer at age 87. Patients' mother is 58 years old as of November 2019. The patient has 1 brother and 2 sisters. Patient denies a family history of ovarian or breast cancer.   GYNECOLOGIC HISTORY:  No LMP recorded. Patient has had a hysterectomy. 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 Job is very stressful and schedule is  She is divorced and lives by herself with her 2 dogs. Her daughter Tiffany Montes is a Dealer in [place], [name].  The patient's son, Tiffany Montes is a Training and development officer and lives in [place]. The patient has 2 grandchildren and 1 great-grand child.  She is not a church or tender  ADVANCED DIRECTIVES: Her Ruth is her daughter, Tiffany Montes,  [phone].     HEALTH MAINTENANCE: Social History   Tobacco Use  . Smoking status: Never Smoker  . Smokeless tobacco: Never Used  Substance Use Topics  . Alcohol use: Yes    Comment: 1-2 a week and reports not every week  . Drug use: No    Colonoscopy: January 2013  PAP: 1 month ago  Bone density: Yes, 2 years ago   Allergies  Allergen Reactions  . Morphine Sulfate Nausea And Vomiting and Rash    GI upset    Current Outpatient Medications  Medication Sig Dispense Refill  . amphetamine-dextroamphetamine (ADDERALL XR) 30 MG 24 hr capsule Take one tablet by mouth daily. 30 capsule 0  . augmented betamethasone dipropionate (DIPROLENE-AF) 0.05 % cream Apply 1 application topically 2 (two) times daily as needed (for eczema). 30 g 2  . benzonatate (TESSALON) 200 MG capsule TAKE 1 CAPSULE AS NEEDED FOR ALLERGIES(COUGH) (Patient taking differently: Take 200 mg by mouth daily as needed (allergies/cough). ) 60 capsule 0  . BIOTIN PO Take 1 tablet by mouth daily.    Marland Kitchen buPROPion [facility]  XL) 300 MG 24 hr tablet TAKE 1 TABLET BY MOUTH EVERY DAY 90 tablet 0  . Cholecalciferol (VITAMIN D3 PO) Take 1 tablet by mouth daily.    . clonazePAM (KLONOPIN) 0.5 MG tablet TAKE 1 TABLET BY MOUTH AT BEDTIME AS NEEDED (Patient taking differently: Take 0.5 mg by mouth daily as needed (anxiety/panic attacks.). ) 30 tablet 5  . doxycycline (VIBRAMYCIN) 100 MG capsule Take 1 capsule (100 mg total) by mouth 2 (two) times daily. 20 capsule 0  . fluticasone (FLONASE) 50 MCG/ACT nasal spray Place 2 sprays into both nostrils daily as needed for allergies. 16 g 2  . gabapentin (NEURONTIN) 300 MG capsule Take 1 capsule (300 mg total) by mouth at bedtime. 90 capsule 4  . levothyroxine (SYNTHROID, LEVOTHROID) 125 MCG tablet TAKE 1 TABLET BY MOUTH EVERY DAY (Patient taking differently: Take 125 mcg by mouth daily before breakfast. ) 90 tablet 1  . lidocaine-prilocaine (EMLA) cream Apply to affected area once  (Patient taking differently: Apply 1 application topically daily as needed (prior to port is accessed.). ) 30 g 3  . losartan-hydrochlorothiazide (HYZAAR) 50-12.5 MG tablet TAKE 1 TABLET BY MOUTH EVERY DAY 90 tablet 1  . magic mouthwash w/lidocaine SOLN Take 5 mLs by mouth 4 (four) times daily as needed for mouth pain. Gargle and spit. 240 mL 2  . Multiple Vitamins-Minerals (MULTIVITAMINS THER. W/MINERALS) TABS Take 1 tablet by mouth daily.      . prochlorperazine (COMPAZINE) 10 MG tablet Take 1 tablet (10 mg total) by mouth every 6 (six) hours as needed (Nausea or vomiting). 30 tablet 1  . sertraline (ZOLOFT) 50 MG tablet TAKE 1 TABLET BY MOUTH EVERY DAY (Patient taking differently: Take 50 mg by mouth daily. ) 90 tablet 2  . SUMAtriptan (IMITREX) 100 MG tablet TAKE 1 TABLET BY MOUTH DAILY AS NEEDED FOR MIGRAINE. MAY REPEAT IN 24 HOURS. 9 tablet 2  . traMADol (ULTRAM) 50 MG tablet Take 1 tablet (50 mg total) by mouth every 6 (six) hours as needed. 30 tablet 0   No current facility-administered medications for this visit.     OBJECTIVE: Middle-aged white woman who appears stated age  61:   12/11/18 0835  BP: (!) 153/87  Pulse: 95  Resp: 18  Temp: 98.4 F (36.9 C)  SpO2: 99%     Body mass index is 39.43 kg/m.   Wt Readings from Last 3 Encounters:  12/11/18 215 lb 9.6 oz (97.8 kg)  12/04/18 212 lb 12.8 oz (96.5 kg)  11/27/18 214 lb (97.1 kg)  ECOG FS:2  GENERAL: Patient is a well appearing female in no acute distress HEENT:  Sclerae anicteric.  Oropharynx clear and moist. No ulcerations or evidence of oropharyngeal candidiasis. Neck is supple.  NODES:  No cervical, supraclavicular, or axillary lymphadenopathy palpated.  BREAST EXAM:  Deferred. LUNGS:  Clear to auscultation bilaterally.  No wheezes or rhonchi. HEART:  Regular rate and rhythm. No murmur appreciated. ABDOMEN:  Soft, nontender.  Positive, normoactive bowel sounds. No organomegaly palpated. MSK:  No focal spinal  tenderness to palpation. Full range of motion bilaterally in the upper extremities. EXTREMITIES:  No peripheral edema.   SKIN:  Clear with no obvious rashes or skin changes. No nail dyscrasia. NEURO:  Nonfocal. Well oriented.  Appropriate affect.   LAB RESULTS:  CMP     Component Value Date/Time   NA 139 12/04/2018 0913   K 4.2 12/04/2018 0913   CL 106 12/04/2018 0913   CO2 24 12/04/2018  0913   GLUCOSE 114 (H) 12/04/2018 0913   BUN 22 12/04/2018 0913   CREATININE 0.89 12/04/2018 0913   CREATININE 0.89 08/12/2018 1222   CALCIUM 9.4 12/04/2018 0913   PROT 7.0 12/04/2018 0913   ALBUMIN 3.7 12/04/2018 0913   AST 22 12/04/2018 0913   AST 14 (L) 08/12/2018 1222   ALT 30 12/04/2018 0913   ALT 14 08/12/2018 1222   ALKPHOS 91 12/04/2018 0913   BILITOT 0.3 12/04/2018 0913   BILITOT 0.2 (L) 08/12/2018 1222   GFRNONAA >60 12/04/2018 0913   GFRNONAA >60 08/12/2018 1222   GFRAA >60 12/04/2018 0913   GFRAA >60 08/12/2018 1222    No results found for: TOTALPROTELP, ALBUMINELP, A1GS, A2GS, BETS, BETA2SER, GAMS, MSPIKE, SPEI  No results found for: KPAFRELGTCHN, LAMBDASER, KAPLAMBRATIO  Lab Results  Component Value Date   WBC 5.7 12/11/2018   NEUTROABS 2.9 12/11/2018   HGB 11.3 (L) 12/11/2018   HCT 35.7 (L) 12/11/2018   MCV 90.2 12/11/2018   PLT 309 12/11/2018    @LASTCHEMISTRY @  No results found for: LABCA2  No components found for: UYQIHK742  No results for input(s): INR in the last 168 hours.  No results found for: LABCA2  No results found for: VZD638  No results found for: VFI433  No results found for: IRJ188  No results found for: CA2729  No components found for: HGQUANT  No results found for: CEA1 / No results found for: CEA1   No results found for: AFPTUMOR  No results found for: CHROMOGRNA  No results found for: PSA1  Appointment on 12/11/2018  Component Date Value Ref Range Status  . WBC 12/11/2018 5.7  4.0 - 10.5 K/uL Final  . RBC 12/11/2018 3.96   3.87 - 5.11 MIL/uL Final  . Hemoglobin 12/11/2018 11.3* 12.0 - 15.0 g/dL Final  . HCT 12/11/2018 35.7* 36.0 - 46.0 % Final  . MCV 12/11/2018 90.2  80.0 - 100.0 fL Final  . MCH 12/11/2018 28.5  26.0 - 34.0 pg Final  . MCHC 12/11/2018 31.7  30.0 - 36.0 g/dL Final  . RDW 12/11/2018 14.5  11.5 - 15.5 % Final  . Platelets 12/11/2018 309  150 - 400 K/uL Final  . nRBC 12/11/2018 0.4* 0.0 - 0.2 % Final  . Neutrophils Relative % 12/11/2018 52  % Final  . Neutro Abs 12/11/2018 2.9  1.7 - 7.7 K/uL Final  . Lymphocytes Relative 12/11/2018 31  % Final  . Lymphs Abs 12/11/2018 1.8  0.7 - 4.0 K/uL Final  . Monocytes Relative 12/11/2018 9  % Final  . Monocytes Absolute 12/11/2018 0.5  0.1 - 1.0 K/uL Final  . Eosinophils Relative 12/11/2018 4  % Final  . Eosinophils Absolute 12/11/2018 0.2  0.0 - 0.5 K/uL Final  . Basophils Relative 12/11/2018 1  % Final  . Basophils Absolute 12/11/2018 0.1  0.0 - 0.1 K/uL Final  . Immature Granulocytes 12/11/2018 3  % Final  . Abs Immature Granulocytes 12/11/2018 0.18* 0.00 - 0.07 K/uL Final   Performed at [facility], [place] [address] [place], [place] [address]    (this displays the last labs from the last 3 days)  No results found for: TOTALPROTELP, ALBUMINELP, A1GS, A2GS, BETS, BETA2SER, GAMS, MSPIKE, SPEI (this displays SPEP labs)  No results found for: KPAFRELGTCHN, LAMBDASER, KAPLAMBRATIO (kappa/lambda light chains)  No results found for: HGBA, HGBA2QUANT, HGBFQUANT, HGBSQUAN (Hemoglobinopathy evaluation)   No results found for: LDH  No results found for: IRON, TIBC, IRONPCTSAT (Iron  and TIBC)  No results found for: FERRITIN  Urinalysis    Component Value Date/Time   COLORURINE YELLOW 09/13/2011 [place] 09/13/2011 0952   LABSPEC 1.010 09/13/2011 0952   PHURINE 6.5 09/13/2011 0952   GLUCOSEU NEGATIVE 09/13/2011 0952   HGBUR NEGATIVE 09/13/2011 0952   HGBUR negative 04/03/2009 0949   BILIRUBINUR n  09/15/2012 0832   KETONESUR NEGATIVE 09/13/2011 0952   PROTEINUR n 09/15/2012 0832   PROTEINUR NEGATIVE 09/13/2011 0952   UROBILINOGEN 0.2 09/15/2012 0832   UROBILINOGEN 0.2 09/13/2011 0952   NITRITE n 09/15/2012 0832   NITRITE NEGATIVE 09/13/2011 0952   LEUKOCYTESUR Negative 09/15/2012 0832     STUDIES:  No results found.   ELIGIBLE FOR AVAILABLE RESEARCH PROTOCOL: no   ASSESSMENT: 63 y.o. [place] woman status Montes left breast lower inner quadrant biopsy 07/31/2018 for a clinical T1b N0, stage IA invasive ductal carcinoma, grade 3, triple positive, with an MIB-1 of 75%.  (1) status Montes left lumpectomy and sentinel lymph node sampling 09/07/2018 for a pT1c pN0, stage IA invasive ductal carcinoma, grade 3, with negative margins.  (a) a total of 5 sentinel lymph nodes were removed  (2) adjuvant chemotherapy consisting of paclitaxel weekly x12 with trastuzumab every 21 days starting 10/02/2018  (3) trastuzumab to be continued to complete 1 year  (a) echocardiogram 08/17/2018 shows an ejection fraction of 60-65%  (b) echocardiogram on 11/17/2018 shows an ejection fraction of 60-65%  (4) adjuvant radiation to follow-up  (5) antiestrogens to start at the completion of local treatment   PLAN:  Blayklee is doing well today.  Her CBC is stable and I reviewed it with her in detail.  She will proceed with her weekly chemotherapy with Paclitaxel today along with Trastuzumab (so long as CMET is within parameters).  Tiffany Montes and I reviewed the issue of her job.  I recommended that if she is not physically able to fulfill her job duties by the time her disability runs out (01/17/2019), then to get [place] the paperwork to extend her time out of work and bring it in next week to her next appointment.  She notes she will do this.  Tiffany Montes and I discussed that the cancer diagnosis and treatment is a trying time, and that she has undergone many physical and mental changes.  I let her know that as  she completes her treatment, she will likely not go back to her old normal, but a new normal, and that it can sometimes be a process to figure out what that new normal is.  She was tearful as we talked about this and some of the emotions she has been dealing with through this process.  I reviewed with her our wonderful clinical support team with breast cancer support groups, the [facility] wellness classes, and yoga and tai chi.  I reviewed with her that during this time, self care, reading, meditation, can be helpful.  I gave her our monthly calendar, which she appreciated, and notes she will be looking into coming to some of our programs.    Lima will return in one week for labs, f/u with me, and her next Paclitaxel.  She has two more chemotherapy treatments left, and then will see Dr. Lisbeth Renshaw on 01/14/2019.  She knows to call between now and her next appointment for any issues that may arise.  A total of (20) minutes of face-to-face time was spent with this patient with greater than 50% of that time in counseling and care-coordination.  Wilber Bihari, NP  12/11/18 8:49 AM Medical Oncology and Hematology [facility] [address] [place],  [address] Tel. [phone]    Fax. [phone]

## 2018-12-18 ENCOUNTER — Inpatient Hospital Stay: Payer: BLUE CROSS/BLUE SHIELD | Admitting: *Deleted

## 2018-12-18 ENCOUNTER — Inpatient Hospital Stay: Payer: BLUE CROSS/BLUE SHIELD

## 2018-12-18 ENCOUNTER — Other Ambulatory Visit: Payer: Self-pay

## 2018-12-18 ENCOUNTER — Inpatient Hospital Stay (HOSPITAL_BASED_OUTPATIENT_CLINIC_OR_DEPARTMENT_OTHER): Payer: BLUE CROSS/BLUE SHIELD | Admitting: Adult Health

## 2018-12-18 ENCOUNTER — Encounter: Payer: Self-pay | Admitting: Adult Health

## 2018-12-18 ENCOUNTER — Telehealth: Payer: Self-pay | Admitting: Oncology

## 2018-12-18 VITALS — BP 143/95 | HR 86 | Temp 97.7°F | Resp 18 | Ht 62.0 in | Wt 215.4 lb

## 2018-12-18 DIAGNOSIS — Z801 Family history of malignant neoplasm of trachea, bronchus and lung: Secondary | ICD-10-CM | POA: Diagnosis not present

## 2018-12-18 DIAGNOSIS — Z803 Family history of malignant neoplasm of breast: Secondary | ICD-10-CM | POA: Diagnosis not present

## 2018-12-18 DIAGNOSIS — C50312 Malignant neoplasm of lower-inner quadrant of left female breast: Secondary | ICD-10-CM

## 2018-12-18 DIAGNOSIS — Z170 Estrogen receptor positive status [ER+]: Principal | ICD-10-CM

## 2018-12-18 DIAGNOSIS — E785 Hyperlipidemia, unspecified: Secondary | ICD-10-CM | POA: Diagnosis not present

## 2018-12-18 DIAGNOSIS — Z90710 Acquired absence of both cervix and uterus: Secondary | ICD-10-CM

## 2018-12-18 DIAGNOSIS — I10 Essential (primary) hypertension: Secondary | ICD-10-CM

## 2018-12-18 DIAGNOSIS — R5383 Other fatigue: Secondary | ICD-10-CM | POA: Diagnosis not present

## 2018-12-18 DIAGNOSIS — Z5111 Encounter for antineoplastic chemotherapy: Secondary | ICD-10-CM | POA: Diagnosis not present

## 2018-12-18 DIAGNOSIS — Z90722 Acquired absence of ovaries, bilateral: Secondary | ICD-10-CM | POA: Diagnosis not present

## 2018-12-18 DIAGNOSIS — Z5112 Encounter for antineoplastic immunotherapy: Secondary | ICD-10-CM | POA: Diagnosis not present

## 2018-12-18 DIAGNOSIS — F329 Major depressive disorder, single episode, unspecified: Secondary | ICD-10-CM | POA: Diagnosis not present

## 2018-12-18 DIAGNOSIS — Z95828 Presence of other vascular implants and grafts: Secondary | ICD-10-CM

## 2018-12-18 DIAGNOSIS — C50212 Malignant neoplasm of upper-inner quadrant of left female breast: Secondary | ICD-10-CM

## 2018-12-18 DIAGNOSIS — Z79899 Other long term (current) drug therapy: Secondary | ICD-10-CM

## 2018-12-18 DIAGNOSIS — E039 Hypothyroidism, unspecified: Secondary | ICD-10-CM

## 2018-12-18 LAB — CBC WITH DIFFERENTIAL/PLATELET
Abs Immature Granulocytes: 0.16 10*3/uL — ABNORMAL HIGH (ref 0.00–0.07)
Basophils Absolute: 0.1 10*3/uL (ref 0.0–0.1)
Basophils Relative: 1 %
Eosinophils Absolute: 0.2 10*3/uL (ref 0.0–0.5)
Eosinophils Relative: 3 %
HCT: 37.2 % (ref 36.0–46.0)
Hemoglobin: 11.8 g/dL — ABNORMAL LOW (ref 12.0–15.0)
Immature Granulocytes: 3 %
Lymphocytes Relative: 31 %
Lymphs Abs: 1.9 10*3/uL (ref 0.7–4.0)
MCH: 28.6 pg (ref 26.0–34.0)
MCHC: 31.7 g/dL (ref 30.0–36.0)
MCV: 90.1 fL (ref 80.0–100.0)
MONO ABS: 0.5 10*3/uL (ref 0.1–1.0)
Monocytes Relative: 7 %
NEUTROS ABS: 3.4 10*3/uL (ref 1.7–7.7)
Neutrophils Relative %: 55 %
Platelets: 311 10*3/uL (ref 150–400)
RBC: 4.13 MIL/uL (ref 3.87–5.11)
RDW: 14.8 % (ref 11.5–15.5)
WBC: 6.1 10*3/uL (ref 4.0–10.5)
nRBC: 0 % (ref 0.0–0.2)

## 2018-12-18 LAB — COMPREHENSIVE METABOLIC PANEL
ALT: 32 U/L (ref 0–44)
AST: 23 U/L (ref 15–41)
Albumin: 3.7 g/dL (ref 3.5–5.0)
Alkaline Phosphatase: 77 U/L (ref 38–126)
Anion gap: 14 (ref 5–15)
BUN: 25 mg/dL — ABNORMAL HIGH (ref 8–23)
CHLORIDE: 104 mmol/L (ref 98–111)
CO2: 22 mmol/L (ref 22–32)
CREATININE: 0.93 mg/dL (ref 0.44–1.00)
Calcium: 9.3 mg/dL (ref 8.9–10.3)
GFR calc Af Amer: >60 mL/min (ref >60)
GFR calc non Af Amer: >60 mL/min (ref >60)
Glucose, Bld: 124 mg/dL — ABNORMAL HIGH (ref 70–99)
Potassium: 4 mmol/L (ref 3.5–5.1)
Sodium: 140 mmol/L (ref 135–145)
Total Bilirubin: 0.3 mg/dL (ref 0.3–1.2)
Total Protein: 7 g/dL (ref 6.5–8.1)

## 2018-12-18 MED ORDER — SODIUM CHLORIDE 0.9% FLUSH
10.0000 mL | INTRAVENOUS | Status: DC | PRN
  Administered 2018-12-18: 10 mL
  Filled 2018-12-18: qty 10

## 2018-12-18 MED ORDER — SODIUM CHLORIDE 0.9 % IV SOLN
20.0000 mg | Freq: Once | INTRAVENOUS | Status: AC
  Administered 2018-12-18: 20 mg via INTRAVENOUS
  Filled 2018-12-18: qty 2

## 2018-12-18 MED ORDER — ONDANSETRON HCL 4 MG/2ML IJ SOLN
4.0000 mg | Freq: Once | INTRAMUSCULAR | Status: AC
  Administered 2018-12-18: 4 mg via INTRAVENOUS

## 2018-12-18 MED ORDER — SODIUM CHLORIDE 0.9 % IV SOLN
80.0000 mg/m2 | Freq: Once | INTRAVENOUS | Status: AC
  Administered 2018-12-18: 162 mg via INTRAVENOUS
  Filled 2018-12-18: qty 27

## 2018-12-18 MED ORDER — ONDANSETRON HCL 4 MG/2ML IJ SOLN
INTRAMUSCULAR | Status: AC
  Filled 2018-12-18: qty 2

## 2018-12-18 MED ORDER — SODIUM CHLORIDE 0.9 % IV SOLN
Freq: Once | INTRAVENOUS | Status: AC
  Administered 2018-12-18: 09:00:00 via INTRAVENOUS
  Filled 2018-12-18: qty 250

## 2018-12-18 MED ORDER — HEPARIN SOD (PORK) LOCK FLUSH 100 UNIT/ML IV SOLN
500.0000 [IU] | Freq: Once | INTRAVENOUS | Status: DC | PRN
  Filled 2018-12-18: qty 5

## 2018-12-18 MED ORDER — DIPHENHYDRAMINE HCL 50 MG/ML IJ SOLN
INTRAMUSCULAR | Status: AC
  Filled 2018-12-18: qty 1

## 2018-12-18 MED ORDER — SODIUM CHLORIDE 0.9% FLUSH
10.0000 mL | INTRAVENOUS | Status: DC | PRN
  Filled 2018-12-18: qty 10

## 2018-12-18 MED ORDER — DEXAMETHASONE SODIUM PHOSPHATE 10 MG/ML IJ SOLN
INTRAMUSCULAR | Status: AC
  Filled 2018-12-18: qty 1

## 2018-12-18 MED ORDER — DIPHENHYDRAMINE HCL 50 MG/ML IJ SOLN
25.0000 mg | Freq: Once | INTRAMUSCULAR | Status: AC
  Administered 2018-12-18: 25 mg via INTRAVENOUS

## 2018-12-18 MED ORDER — DEXAMETHASONE SODIUM PHOSPHATE 10 MG/ML IJ SOLN
4.0000 mg | Freq: Once | INTRAMUSCULAR | Status: AC
  Administered 2018-12-18: 4 mg via INTRAVENOUS

## 2018-12-18 NOTE — Telephone Encounter (Signed)
Gave avs and calendar ° °

## 2018-12-18 NOTE — Patient Instructions (Signed)
[facility] Discharge Instructions for Patients Receiving Chemotherapy  Today you received the following chemotherapy agents :  Taxol.  To help prevent nausea and vomiting after your treatment, we encourage you to take your nausea medication as prescribed.   If you develop nausea and vomiting that is not controlled by your nausea medication, call the clinic.   BELOW ARE SYMPTOMS THAT SHOULD BE REPORTED IMMEDIATELY:  *FEVER GREATER THAN 100.5 F  *CHILLS WITH OR WITHOUT FEVER  NAUSEA AND VOMITING THAT IS NOT CONTROLLED WITH YOUR NAUSEA MEDICATION  *UNUSUAL SHORTNESS OF BREATH  *UNUSUAL BRUISING OR BLEEDING  TENDERNESS IN MOUTH AND THROAT WITH OR WITHOUT PRESENCE OF ULCERS  *URINARY PROBLEMS  *BOWEL PROBLEMS  UNUSUAL RASH Items with * indicate a potential emergency and should be followed up as soon as possible.  Feel free to call the clinic should you have any questions or concerns. The clinic phone number is [phone].  Please show the CHEMO ALERT CARD at check-in to the Emergency Department and triage nurse.   

## 2018-12-18 NOTE — Progress Notes (Signed)
[place]  Telephone:(336) [phone] Fax:(336) [phone]    ID: KAJAH SANTIZO DOB: 04/15/1956  MR#: [id number]  BTD#:[id number]  Patient Care Team: Eulas Post, MD as PCP - General Fanny Skates, MD as Consulting Physician (General Surgery) Magrinat, Virgie Dad, MD as Consulting Physician (Oncology) Kyung Rudd, MD as Consulting Physician (Radiation Oncology) Maisie Fus, MD as Consulting Physician (Obstetrics and Gynecology) Larey Dresser, MD as Consulting Physician (Cardiology) OTHER MD:    CHIEF COMPLAINT: Estrogen receptor positive breast cancer, HER-2 amplified  CURRENT TREATMENT: Adjuvant paclitaxel/ trastuzumab   HISTORY OF CURRENT ILLNESS: From the original intake note:  "[name]" had routine screening mammography on 07/22/2018 showing a possible abnormality in the left breast. She underwent unilateral diagnostic mammography with tomography and left breast ultrasonography at [facility] on 07/28/2018 showing: Breast density Category C; a 7 x 8 x 8 mm hypoechoic mass in the left breast lower inner quadrant, consistent with a complex cyst.. No abnormal lymph nodes in the left axilla.  Accordingly on 07/31/2018 she proceeded to biopsy of the left breast area in question. The pathology from this procedure showed ([id number]): Invasive Ductal Carcinoma, Grade 3. Prognostic indicators significant for: estrogen receptor, 95% positive and progesterone receptor, 85% positive, both with strong staining intensity. Proliferation marker Ki67 at 75%. HER2 positive by immunohistochemistry, 3+.   The patient's subsequent history is as detailed below.   INTERVAL HISTORY: Maude returns today for follow-up and treatment of her estrogen receptor positive and HER2 amplified breast cancer.   She continues on weekly paclitaxel. Today is day 1 cycle 11.    She also continues on trastuzumab given on day 1 of a 21 day cycle. Today is day 8 cycle 4.  She has had  no issues with these treatments.  Bryla's last echocardiogram on 11/17/2018, showed an ejection fraction in the 60% - 65% range.    REVIEW OF SYSTEMS: Ziyana denies peripheral neuropathy.  She is feeling well other than noting an increase in her fatigue.  She denies any other new issues today.  She is without mucositis, vision changes or headaches.  She denies fevers, chills, nausea, vomiting, bowel/bladder changes.  She hasn't noted chest pain, palpitations, cough, or shortness of breath.  A detailed ROS was otherwise non contributory.      PAST MEDICAL HISTORY: Past Medical History:  Diagnosis Date   Anxiety    Attention deficit disorder    Cancer ([place])    Left breast CA   Depression    Gallstones 09/05/2011   Hyperlipidemia    Hypertension    Hypothyroidism    Migraines    PONV (postoperative nausea and vomiting)    diffficulty waking up   Thyroid disease    hypothyroid    PAST SURGICAL HISTORY: Past Surgical History:  Procedure Laterality Date   ABDOMINAL HYSTERECTOMY  2003   BREAST BIOPSY Left 2014   benign   BREAST LUMPECTOMY WITH RADIOACTIVE SEED AND SENTINEL LYMPH NODE BIOPSY Left 09/07/2018   Procedure: LEFT BREAST LUMPECTOMY WITH RADIOACTIVE SEED AND AXILLARY SENTINEL LYMPH NODE BIOPSY,INJECT BLUE DYE LEFT BREAST;  Surgeon: Fanny Skates, MD;  Location: [facility];  Service: General;  Laterality: Left;   CHOLECYSTECTOMY  09/26/2011   Procedure: LAPAROSCOPIC CHOLECYSTECTOMY WITH INTRAOPERATIVE CHOLANGIOGRAM;  Surgeon: Haywood Lasso, MD;  Location: [facility];  Service: General;  Laterality: N/A;   COLONOSCOPY     CYSTIC HYGROMA EXCISION     DENTAL SURGERY     skin graft from top  of mouth to lower gums   PORTACATH PLACEMENT N/A 09/07/2018   Procedure: INSERTION PORT-A-CATH WITH [place];  Surgeon: Fanny Skates, MD;  Location: [place];  Service: General;  Laterality: N/A;    FAMILY HISTORY: Family History  Problem Relation Age of Onset    Diabetes Father    Cancer Father        lung    Heart disease Father    She notes that her father died from CHF at age 83.  He was diagnosed with lung cancer at age 67. Patients' mother is 64 years old as of November 2019. The patient has 1 brother and 2 sisters. Patient denies a family history of ovarian or breast cancer.   GYNECOLOGIC HISTORY:  No LMP recorded. Patient has had a hysterectomy. 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 Job is very stressful and schedule is  She is divorced and lives by herself with her 2 dogs. Her daughter [name] is a Dealer in [place], [name].  The patient's son, [name] is a Training and development officer and lives in [place]. The patient has 2 grandchildren and 1 great-grand child.  She is not a church or tender  ADVANCED DIRECTIVES: Her [place] is her daughter, [name], [phone].     HEALTH MAINTENANCE: Social History   Tobacco Use   Smoking status: Never Smoker   Smokeless tobacco: Never Used  Substance Use Topics   Alcohol use: Yes    Comment: 1-2 a week and reports not every week   Drug use: No    Colonoscopy: January 2013  PAP: 1 month ago  Bone density: Yes, 2 years ago   Allergies  Allergen Reactions   Morphine Sulfate Nausea And Vomiting and Rash    GI upset    Current Outpatient Medications  Medication Sig Dispense Refill   amphetamine-dextroamphetamine (ADDERALL XR) 30 MG 24 hr capsule Take one tablet by mouth daily. 30 capsule 0   augmented betamethasone dipropionate (DIPROLENE-AF) 0.05 % cream Apply 1 application topically 2 (two) times daily as needed (for eczema). 30 g 2   benzonatate (TESSALON) 200 MG capsule TAKE 1 CAPSULE AS NEEDED FOR  ALLERGIES(COUGH) (Patient taking differently: Take 200 mg by mouth daily as needed (allergies/cough). ) 60 capsule 0   BIOTIN PO Take 1 tablet by mouth daily.     buPROPion (WELLBUTRIN XL) 300 MG 24 hr tablet TAKE 1 TABLET BY MOUTH EVERY DAY 90 tablet 0   Cholecalciferol (VITAMIN D3 PO) Take 1 tablet by mouth daily.     clonazePAM (KLONOPIN) 0.5 MG tablet TAKE 1 TABLET BY MOUTH AT BEDTIME AS NEEDED (Patient taking differently: Take 0.5 mg by mouth daily as needed (anxiety/panic attacks.). ) 30 tablet 5   doxycycline (VIBRAMYCIN) 100 MG capsule Take 1 capsule (100 mg total) by mouth 2 (two) times daily. 20 capsule 0   fluticasone (FLONASE) 50 MCG/ACT nasal spray Place 2 sprays into both nostrils daily as needed for allergies. 16 g 2   gabapentin (NEURONTIN) 300 MG capsule Take 1 capsule (300 mg total) by mouth at bedtime. 90 capsule 4   levothyroxine (SYNTHROID, LEVOTHROID) 125 MCG tablet TAKE 1 TABLET BY MOUTH EVERY DAY (Patient taking differently: Take 125 mcg by mouth daily before  breakfast. ) 90 tablet 1   lidocaine-prilocaine (EMLA) cream Apply to affected area once (Patient taking differently: Apply 1 application topically daily as needed (prior to port is accessed.). ) 30 g 3   losartan-hydrochlorothiazide (HYZAAR) 50-12.5 MG tablet TAKE 1 TABLET BY MOUTH EVERY DAY 90 tablet 1   magic mouthwash w/lidocaine SOLN Take 5 mLs by mouth 4 (four) times daily as needed for mouth pain. Gargle and spit. 240 mL 2   Multiple Vitamins-Minerals (MULTIVITAMINS THER. W/MINERALS) TABS Take 1 tablet by mouth daily.       prochlorperazine (COMPAZINE) 10 MG tablet Take 1 tablet (10 mg total) by mouth every 6 (six) hours as needed (Nausea or vomiting). 30 tablet 1   sertraline (ZOLOFT) 50 MG tablet TAKE 1 TABLET BY MOUTH EVERY DAY (Patient taking differently: Take 50 mg by mouth daily. ) 90 tablet 2   SUMAtriptan (IMITREX) 100 MG tablet TAKE 1 TABLET BY MOUTH DAILY AS NEEDED FOR MIGRAINE. MAY REPEAT  IN 24 HOURS. 9 tablet 2   traMADol (ULTRAM) 50 MG tablet Take 1 tablet (50 mg total) by mouth every 6 (six) hours as needed. 30 tablet 0   No current facility-administered medications for this visit.     OBJECTIVE:  Vitals:   12/18/18 0831  BP: (!) 143/95  Pulse: 86  Resp: 18  Temp: 97.7 F (36.5 C)  SpO2: 98%     Body mass index is 39.4 kg/m.   Wt Readings from Last 3 Encounters:  12/18/18 215 lb 6.4 oz (97.7 kg)  12/11/18 215 lb 9.6 oz (97.8 kg)  12/04/18 212 lb 12.8 oz (96.5 kg)  ECOG FS:2  GENERAL: Patient is a well appearing female in no acute distress HEENT:  Sclerae anicteric.  Oropharynx clear and moist. No ulcerations or evidence of oropharyngeal candidiasis. Neck is supple.  NODES:  No cervical, supraclavicular, or axillary lymphadenopathy palpated.  BREAST EXAM:  S/p left lumpectomy, no sign of local recurrence, right breast benign LUNGS:  Clear to auscultation bilaterally.  No wheezes or rhonchi. HEART:  Regular rate and rhythm. No murmur appreciated. ABDOMEN:  Soft, nontender.  Positive, normoactive bowel sounds. No organomegaly palpated. MSK:  No focal spinal tenderness to palpation. Full range of motion bilaterally in the upper extremities. EXTREMITIES:  No peripheral edema.   SKIN:  Clear with no obvious rashes or skin changes. No nail dyscrasia. NEURO:  Nonfocal. Well oriented.  Appropriate affect.   LAB RESULTS:  CMP     Component Value Date/Time   NA 138 12/11/2018 0823   K 4.0 12/11/2018 0823   CL 105 12/11/2018 0823   CO2 24 12/11/2018 0823   GLUCOSE 122 (H) 12/11/2018 0823   BUN 27 (H) 12/11/2018 0823   CREATININE 0.91 12/11/2018 0823   CREATININE 0.89 08/12/2018 1222   CALCIUM 9.0 12/11/2018 0823   PROT 6.7 12/11/2018 0823   ALBUMIN 3.8 12/11/2018 0823   AST 31 12/11/2018 0823   AST 14 (L) 08/12/2018 1222   ALT 50 (H) 12/11/2018 0823   ALT 14 08/12/2018 1222   ALKPHOS 78 12/11/2018 0823   BILITOT 0.2 (L) 12/11/2018 0823   BILITOT 0.2  (L) 08/12/2018 1222   GFRNONAA >60 12/11/2018 0823   GFRNONAA >60 08/12/2018 1222   GFRAA >60 12/11/2018 0823   GFRAA >60 08/12/2018 1222    No results found for: TOTALPROTELP, ALBUMINELP, A1GS, A2GS, BETS, BETA2SER, GAMS, MSPIKE, SPEI  No results found for: KPAFRELGTCHN, LAMBDASER, KAPLAMBRATIO  Lab Results  Component Value Date  WBC 6.1 12/18/2018   NEUTROABS 3.4 12/18/2018   HGB 11.8 (L) 12/18/2018   HCT 37.2 12/18/2018   MCV 90.1 12/18/2018   PLT 311 12/18/2018    _0 @  No results found for: LABCA2  No components found for: JGGEZM629  No results for input(s): INR in the last 168 hours.  No results found for: LABCA2  No results found for: UTM546  No results found for: TKP546  No results found for: FKC127  No results found for: CA2729  No components found for: HGQUANT  No results found for: CEA1 / No results found for: CEA1   No results found for: AFPTUMOR  No results found for: CHROMOGRNA  No results found for: PSA1  Clinical Support on 12/18/2018  Component Date Value Ref Range Status   WBC 12/18/2018 6.1  4.0 - 10.5 K/uL Final   RBC 12/18/2018 4.13  3.87 - 5.11 MIL/uL Final   Hemoglobin 12/18/2018 11.8* 12.0 - 15.0 g/dL Final   HCT 12/18/2018 37.2  36.0 - 46.0 % Final   MCV 12/18/2018 90.1  80.0 - 100.0 fL Final   MCH 12/18/2018 28.6  26.0 - 34.0 pg Final   MCHC 12/18/2018 31.7  30.0 - 36.0 g/dL Final   RDW 12/18/2018 14.8  11.5 - 15.5 % Final   Platelets 12/18/2018 311  150 - 400 K/uL Final   nRBC 12/18/2018 0.0  0.0 - 0.2 % Final   Neutrophils Relative % 12/18/2018 55  % Final   Neutro Abs 12/18/2018 3.4  1.7 - 7.7 K/uL Final   Lymphocytes Relative 12/18/2018 31  % Final   Lymphs Abs 12/18/2018 1.9  0.7 - 4.0 K/uL Final   Monocytes Relative 12/18/2018 7  % Final   Monocytes Absolute 12/18/2018 0.5  0.1 - 1.0 K/uL Final   Eosinophils Relative 12/18/2018 3  % Final   Eosinophils Absolute 12/18/2018 0.2  0.0 - 0.5  K/uL Final   Basophils Relative 12/18/2018 1  % Final   Basophils Absolute 12/18/2018 0.1  0.0 - 0.1 K/uL Final   Immature Granulocytes 12/18/2018 3  % Final   Abs Immature Granulocytes 12/18/2018 0.16* 0.00 - 0.07 K/uL Final   Performed at [facility], [address] [place], [place] [address]    (this displays the last labs from the last 3 days)  No results found for: TOTALPROTELP, ALBUMINELP, A1GS, A2GS, BETS, BETA2SER, GAMS, MSPIKE, SPEI (this displays SPEP labs)  No results found for: KPAFRELGTCHN, LAMBDASER, KAPLAMBRATIO (kappa/lambda light chains)  No results found for: HGBA, HGBA2QUANT, HGBFQUANT, HGBSQUAN (Hemoglobinopathy evaluation)   No results found for: LDH  No results found for: IRON, TIBC, IRONPCTSAT (Iron and TIBC)  No results found for: FERRITIN  Urinalysis    Component Value Date/Time   COLORURINE YELLOW 09/13/2011 [place] 09/13/2011 0952   LABSPEC 1.010 09/13/2011 0952   PHURINE 6.5 09/13/2011 [place] 09/13/2011 [place] 09/13/2011 0952   HGBUR negative 04/03/2009 0949   BILIRUBINUR n 09/15/2012 [place] 09/13/2011 0952   PROTEINUR n 09/15/2012 0832   PROTEINUR NEGATIVE 09/13/2011 0952   UROBILINOGEN 0.2 09/15/2012 0832   UROBILINOGEN 0.2 09/13/2011 0952   NITRITE n 09/15/2012 0832   NITRITE NEGATIVE 09/13/2011 0952   LEUKOCYTESUR Negative 09/15/2012 0832     STUDIES:  No results found.   ELIGIBLE FOR AVAILABLE RESEARCH PROTOCOL: no   ASSESSMENT: 63 y.o. [place] woman status post left breast lower inner quadrant  biopsy 07/31/2018 for a clinical T1b N0, stage IA invasive ductal carcinoma, grade 3, triple positive, with an MIB-1 of 75%.  (1) status post left lumpectomy and sentinel lymph node sampling 09/07/2018 for a pT1c pN0, stage IA invasive ductal carcinoma, grade 3, with negative margins.  (a) a total of 5 sentinel lymph nodes were  removed  (2) adjuvant chemotherapy consisting of paclitaxel weekly x12 with trastuzumab every 21 days starting 10/02/2018  (3) trastuzumab to be continued to complete 1 year  (a) echocardiogram 08/17/2018 shows an ejection fraction of 60-65%  (b) echocardiogram on 11/17/2018 shows an ejection fraction of 60-65%  (4) adjuvant radiation to follow-up  (5) antiestrogens to start at the completion of local treatment   PLAN:  Ytzel is doing well today. Her CBC is stable and I reivewed this with her in detail.  Her CMET is pending.  She has not developed peripheral neuropathy which is very positive!  She will proceed with her 11th cycle of Paclitaxel today.  I reviewed her upcoming appointments with her.  She will complete her 12th Paclitaxel next week.  I will see her with this final visit.  She will need to receive her every 3 week Trastuzumab on 01/01/2019.  I reviewed Trastuzumab and the plan for her to receive it every 3 weeks for through 09/2019.  I reviewed that she will need to undergo echocardiogram every 3 months and also referred her to our cardio oncology group to monitor her heart through the rest of her Trastuzumab.    Laquinda will also see Dr. Lisbeth Renshaw on 01/14/2019 to review radiation and undergo CT simulation.  She will see Dr. Jana Hakim again in early May to review her echocardiogram and to also discuss starting anti estrogen therapy at the completion of her radiation therapy.  She knows to call between now and her next appointment for any issues that may arise.  A total of (30) minutes of face-to-face time was spent with this patient with greater than 50% of that time in counseling and care-coordination.   Wilber Bihari, NP  12/18/18 8:38 AM Medical Oncology and Hematology [facility] [address] [place], [place] [address] Tel. [phone]    Fax. [phone]

## 2018-12-25 ENCOUNTER — Inpatient Hospital Stay: Payer: BLUE CROSS/BLUE SHIELD

## 2018-12-25 ENCOUNTER — Other Ambulatory Visit: Payer: Self-pay

## 2018-12-25 ENCOUNTER — Encounter: Payer: Self-pay | Admitting: Adult Health

## 2018-12-25 ENCOUNTER — Inpatient Hospital Stay (HOSPITAL_BASED_OUTPATIENT_CLINIC_OR_DEPARTMENT_OTHER): Payer: BLUE CROSS/BLUE SHIELD | Admitting: Adult Health

## 2018-12-25 VITALS — BP 145/81 | HR 70 | Temp 97.8°F | Resp 18 | Ht 62.0 in | Wt 214.9 lb

## 2018-12-25 DIAGNOSIS — Z90710 Acquired absence of both cervix and uterus: Secondary | ICD-10-CM | POA: Diagnosis not present

## 2018-12-25 DIAGNOSIS — Z5112 Encounter for antineoplastic immunotherapy: Secondary | ICD-10-CM | POA: Diagnosis not present

## 2018-12-25 DIAGNOSIS — E785 Hyperlipidemia, unspecified: Secondary | ICD-10-CM

## 2018-12-25 DIAGNOSIS — Z90722 Acquired absence of ovaries, bilateral: Secondary | ICD-10-CM

## 2018-12-25 DIAGNOSIS — R5383 Other fatigue: Secondary | ICD-10-CM

## 2018-12-25 DIAGNOSIS — C50212 Malignant neoplasm of upper-inner quadrant of left female breast: Secondary | ICD-10-CM

## 2018-12-25 DIAGNOSIS — F329 Major depressive disorder, single episode, unspecified: Secondary | ICD-10-CM | POA: Diagnosis not present

## 2018-12-25 DIAGNOSIS — Z801 Family history of malignant neoplasm of trachea, bronchus and lung: Secondary | ICD-10-CM | POA: Diagnosis not present

## 2018-12-25 DIAGNOSIS — Z79899 Other long term (current) drug therapy: Secondary | ICD-10-CM | POA: Diagnosis not present

## 2018-12-25 DIAGNOSIS — E039 Hypothyroidism, unspecified: Secondary | ICD-10-CM

## 2018-12-25 DIAGNOSIS — C50312 Malignant neoplasm of lower-inner quadrant of left female breast: Secondary | ICD-10-CM | POA: Diagnosis not present

## 2018-12-25 DIAGNOSIS — Z5111 Encounter for antineoplastic chemotherapy: Secondary | ICD-10-CM | POA: Diagnosis not present

## 2018-12-25 DIAGNOSIS — Z803 Family history of malignant neoplasm of breast: Secondary | ICD-10-CM | POA: Diagnosis not present

## 2018-12-25 DIAGNOSIS — Z170 Estrogen receptor positive status [ER+]: Secondary | ICD-10-CM | POA: Diagnosis not present

## 2018-12-25 DIAGNOSIS — I10 Essential (primary) hypertension: Secondary | ICD-10-CM

## 2018-12-25 DIAGNOSIS — Z95828 Presence of other vascular implants and grafts: Secondary | ICD-10-CM

## 2018-12-25 LAB — CMP (CANCER CENTER ONLY)
ALT: 33 U/L (ref 0–44)
AST: 23 U/L (ref 15–41)
Albumin: 3.7 g/dL (ref 3.5–5.0)
Alkaline Phosphatase: 70 U/L (ref 38–126)
Anion gap: 11 (ref 5–15)
BUN: 21 mg/dL (ref 8–23)
CO2: 23 mmol/L (ref 22–32)
Calcium: 8.8 mg/dL — ABNORMAL LOW (ref 8.9–10.3)
Chloride: 106 mmol/L (ref 98–111)
Creatinine: 0.92 mg/dL (ref 0.44–1.00)
GFR, Est AFR Am: >60 mL/min (ref >60)
GFR, Estimated: >60 mL/min (ref >60)
Glucose, Bld: 109 mg/dL — ABNORMAL HIGH (ref 70–99)
Potassium: 3.7 mmol/L (ref 3.5–5.1)
Sodium: 140 mmol/L (ref 135–145)
Total Bilirubin: 0.3 mg/dL (ref 0.3–1.2)
Total Protein: 6.5 g/dL (ref 6.5–8.1)

## 2018-12-25 LAB — CBC WITH DIFFERENTIAL/PLATELET
Abs Immature Granulocytes: 0.09 10*3/uL — ABNORMAL HIGH (ref 0.00–0.07)
Basophils Absolute: 0.1 10*3/uL (ref 0.0–0.1)
Basophils Relative: 1 %
EOS ABS: 0.2 10*3/uL (ref 0.0–0.5)
Eosinophils Relative: 3 %
HCT: 33.6 % — ABNORMAL LOW (ref 36.0–46.0)
Hemoglobin: 10.9 g/dL — ABNORMAL LOW (ref 12.0–15.0)
Immature Granulocytes: 2 %
Lymphocytes Relative: 33 %
Lymphs Abs: 1.8 10*3/uL (ref 0.7–4.0)
MCH: 29.5 pg (ref 26.0–34.0)
MCHC: 32.4 g/dL (ref 30.0–36.0)
MCV: 90.8 fL (ref 80.0–100.0)
Monocytes Absolute: 0.4 10*3/uL (ref 0.1–1.0)
Monocytes Relative: 8 %
Neutro Abs: 2.8 10*3/uL (ref 1.7–7.7)
Neutrophils Relative %: 53 %
PLATELETS: 241 10*3/uL (ref 150–400)
RBC: 3.7 MIL/uL — ABNORMAL LOW (ref 3.87–5.11)
RDW: 15 % (ref 11.5–15.5)
WBC: 5.3 10*3/uL (ref 4.0–10.5)
nRBC: 0 % (ref 0.0–0.2)

## 2018-12-25 MED ORDER — HEPARIN SOD (PORK) LOCK FLUSH 100 UNIT/ML IV SOLN
500.0000 [IU] | Freq: Once | INTRAVENOUS | Status: AC | PRN
  Administered 2018-12-25: 500 [IU]
  Filled 2018-12-25: qty 5

## 2018-12-25 MED ORDER — DIPHENHYDRAMINE HCL 50 MG/ML IJ SOLN
INTRAMUSCULAR | Status: AC
  Filled 2018-12-25: qty 1

## 2018-12-25 MED ORDER — ONDANSETRON HCL 4 MG/2ML IJ SOLN
4.0000 mg | Freq: Once | INTRAMUSCULAR | Status: AC
  Administered 2018-12-25: 4 mg via INTRAVENOUS

## 2018-12-25 MED ORDER — DEXAMETHASONE SODIUM PHOSPHATE 10 MG/ML IJ SOLN
4.0000 mg | Freq: Once | INTRAMUSCULAR | Status: AC
  Administered 2018-12-25: 4 mg via INTRAVENOUS

## 2018-12-25 MED ORDER — ONDANSETRON HCL 4 MG/2ML IJ SOLN
INTRAMUSCULAR | Status: AC
  Filled 2018-12-25: qty 2

## 2018-12-25 MED ORDER — SODIUM CHLORIDE 0.9% FLUSH
10.0000 mL | INTRAVENOUS | Status: DC | PRN
  Administered 2018-12-25: 10 mL
  Filled 2018-12-25: qty 10

## 2018-12-25 MED ORDER — SODIUM CHLORIDE 0.9 % IV SOLN
20.0000 mg | Freq: Once | INTRAVENOUS | Status: AC
  Administered 2018-12-25: 20 mg via INTRAVENOUS
  Filled 2018-12-25: qty 2

## 2018-12-25 MED ORDER — DIPHENHYDRAMINE HCL 50 MG/ML IJ SOLN
25.0000 mg | Freq: Once | INTRAMUSCULAR | Status: AC
  Administered 2018-12-25: 25 mg via INTRAVENOUS

## 2018-12-25 MED ORDER — SODIUM CHLORIDE 0.9 % IV SOLN
Freq: Once | INTRAVENOUS | Status: AC
  Administered 2018-12-25: 10:00:00 via INTRAVENOUS
  Filled 2018-12-25: qty 250

## 2018-12-25 MED ORDER — DEXAMETHASONE SODIUM PHOSPHATE 10 MG/ML IJ SOLN
INTRAMUSCULAR | Status: AC
  Filled 2018-12-25: qty 1

## 2018-12-25 MED ORDER — SODIUM CHLORIDE 0.9 % IV SOLN
80.0000 mg/m2 | Freq: Once | INTRAVENOUS | Status: AC
  Administered 2018-12-25: 162 mg via INTRAVENOUS
  Filled 2018-12-25: qty 27

## 2018-12-25 NOTE — Progress Notes (Signed)
[place]  Telephone:([phone]) [phone] [phone]) [phone]    ID: Tiffany Montes DOB: 08/16/1956  MR#: [id number]  KMQ#:[id number]  Patient Care Team: Eulas Post, MD as PCP - General Fanny Skates, MD as Consulting Physician (General Surgery) Magrinat, Virgie Dad, MD as Consulting Physician (Oncology) Kyung Rudd, MD as Consulting Physician (Radiation Oncology) Maisie Fus, MD as Consulting Physician (Obstetrics and Gynecology) Larey Dresser, MD as Consulting Physician (Cardiology) OTHER MD:    CHIEF COMPLAINT: Estrogen receptor positive breast cancer, HER-2 amplified  CURRENT TREATMENT: Adjuvant paclitaxel/ trastuzumab   HISTORY OF CURRENT ILLNESS: From the original intake note:  "Tiffany Montes" had routine screening mammography on 07/22/2018 showing a possible abnormality in the left breast. She underwent unilateral diagnostic mammography with tomography and left breast ultrasonography at [facility] on 07/28/2018 showing: Breast density Category C; a 7 x 8 x 8 mm hypoechoic mass in the left breast lower inner quadrant, consistent with a complex cyst.. No abnormal lymph nodes in the left axilla.  Accordingly on 07/31/2018 she proceeded to biopsy of the left breast area in question. The pathology from this procedure showed ([id number]): Invasive Ductal Carcinoma, Grade 3. Prognostic indicators significant for: estrogen receptor, 95% positive and progesterone receptor, 85% positive, both with strong staining intensity. Proliferation marker Ki67 at 75%. HER2 positive by immunohistochemistry, 3+.   The patient's subsequent history is as detailed below.   INTERVAL HISTORY: Tiffany Montes returns today for follow-up and treatment of her estrogen receptor positive and HER2 amplified breast cancer.   She continues on weekly paclitaxel. Today is day 1 cycle 12.    She also continues on trastuzumab given on day 1 of a 21 day cycle. Today is day 15 cycle 4.  She has had  no issues with these treatments.  Tiffany Montes's last echocardiogram on 11/17/2018, showed an ejection fraction in the 60% - 65% range.    REVIEW OF SYSTEMS: Tiffany Montes is feeling well today.  She continues to deny peripheral neuropathy.  She is excited to complete her chemotherapy, but has noted a cumulative fatigue.  She denies any chest pain, palpitations, cough, shortness of breath, fevers or chills.  She hasn't noted nausea, vomiting, bowel/bladder changes, dysphagia, mucositis, indigestion.  A detailed ROS was otherwise non contributory.      PAST MEDICAL HISTORY: Past Medical History:  Diagnosis Date  . Anxiety   . Attention deficit disorder   . Cancer ([place])    Left breast CA  . Depression   . Gallstones 09/05/2011  . Hyperlipidemia   . Hypertension   . Hypothyroidism   . Migraines   . PONV (postoperative nausea and vomiting)    diffficulty waking up  . Thyroid disease    hypothyroid    PAST SURGICAL HISTORY: Past Surgical History:  Procedure Laterality Date  . ABDOMINAL HYSTERECTOMY  2003  . BREAST BIOPSY Left 2014   benign  . BREAST LUMPECTOMY WITH RADIOACTIVE SEED AND SENTINEL LYMPH NODE BIOPSY Left 09/07/2018   Procedure: LEFT BREAST LUMPECTOMY WITH RADIOACTIVE SEED AND AXILLARY SENTINEL LYMPH NODE BIOPSY,INJECT BLUE DYE LEFT BREAST;  Surgeon: Fanny Skates, MD;  Location: [facility];  Service: General;  Laterality: Left;  . CHOLECYSTECTOMY  09/26/2011   Procedure: LAPAROSCOPIC CHOLECYSTECTOMY WITH INTRAOPERATIVE CHOLANGIOGRAM;  Surgeon: Haywood Lasso, MD;  Location: [facility];  Service: General;  Laterality: N/A;  . COLONOSCOPY    . CYSTIC HYGROMA EXCISION    . DENTAL SURGERY     skin graft from top of mouth to lower  gums  . PORTACATH PLACEMENT N/A 09/07/2018   Procedure: INSERTION PORT-A-CATH WITH [place];  Surgeon: Fanny Skates, MD;  Location: [facility] OR;  Service: General;  Laterality: N/A;    FAMILY HISTORY: Family History  Problem Relation Age of Onset  . Diabetes  Father   . Cancer Father        lung   . Heart disease Father    She notes that her father died from CHF at age 6.  He was diagnosed with lung cancer at age 75. Patients' mother is 78 years old as of November 2019. The patient has 1 brother and 2 sisters. Patient denies a family history of ovarian or breast cancer.   GYNECOLOGIC HISTORY:  No LMP recorded. Patient has had a hysterectomy. 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 Job is very stressful and schedule is  She is divorced and lives by herself with her 2 dogs. Her daughter [name] is a Dealer in [place], [name].  The patient's son, [name] is a Training and development officer and lives in [place]. The patient has 2 grandchildren and 1 great-grand child.  She is not a church or tender  ADVANCED DIRECTIVES: Her [place] is her daughter, [name], [phone].     HEALTH MAINTENANCE: Social History   Tobacco Use  . Smoking status: Never Smoker  . Smokeless tobacco: Never Used  Substance Use Topics  . Alcohol use: Yes    Comment: 1-2 a week and reports not every week  . Drug use: No    Colonoscopy: January 2013  PAP: 1 month ago  Bone density: Yes, 2 years ago   Allergies  Allergen Reactions  . Morphine Sulfate Nausea And Vomiting and Rash    GI upset    Current Outpatient Medications  Medication Sig Dispense Refill  . amphetamine-dextroamphetamine (ADDERALL XR) 30 MG 24 hr capsule Take one tablet by mouth daily. 30 capsule 0  . augmented betamethasone dipropionate (DIPROLENE-AF) 0.05 % cream Apply 1 application topically 2 (two) times daily as needed (for eczema). 30 g 2  . benzonatate (TESSALON) 200 MG capsule TAKE 1 CAPSULE AS NEEDED FOR  ALLERGIES(COUGH) (Patient taking differently: Take 200 mg by mouth daily as needed (allergies/cough). ) 60 capsule 0  . BIOTIN PO Take 1 tablet by mouth daily.    Marland Kitchen buPROPion (WELLBUTRIN XL) 300 MG 24 hr tablet TAKE 1 TABLET BY MOUTH EVERY DAY 90 tablet 0  . Cholecalciferol (VITAMIN D3 PO) Take 1 tablet by mouth daily.    . clonazePAM (KLONOPIN) 0.5 MG tablet TAKE 1 TABLET BY MOUTH AT BEDTIME AS NEEDED (Patient taking differently: Take 0.5 mg by mouth daily as needed (anxiety/panic attacks.). ) 30 tablet 5  . doxycycline (VIBRAMYCIN) 100 MG capsule Take 1 capsule (100 mg total) by mouth 2 (two) times daily. 20 capsule 0  . fluticasone (FLONASE) 50 MCG/ACT nasal spray Place 2 sprays into both nostrils daily as needed for allergies. 16 g 2  . gabapentin (NEURONTIN) 300 MG capsule Take 1 capsule (300 mg total) by mouth at bedtime. 90 capsule 4  . levothyroxine (SYNTHROID, LEVOTHROID) 125 MCG tablet TAKE 1 TABLET BY MOUTH EVERY DAY (Patient taking differently: Take 125 mcg by mouth daily before breakfast. ) 90 tablet  1  . lidocaine-prilocaine (EMLA) cream Apply to affected area once (Patient taking differently: Apply 1 application topically daily as needed (prior to port is accessed.). ) 30 g 3  . losartan-hydrochlorothiazide (HYZAAR) 50-12.5 MG tablet TAKE 1 TABLET BY MOUTH EVERY DAY 90 tablet 1  . magic mouthwash w/lidocaine SOLN Take 5 mLs by mouth 4 (four) times daily as needed for mouth pain. Gargle and spit. 240 mL 2  . Multiple Vitamins-Minerals (MULTIVITAMINS THER. W/MINERALS) TABS Take 1 tablet by mouth daily.      . prochlorperazine (COMPAZINE) 10 MG tablet Take 1 tablet (10 mg total) by mouth every 6 (six) hours as needed (Nausea or vomiting). 30 tablet 1  . sertraline (ZOLOFT) 50 MG tablet TAKE 1 TABLET BY MOUTH EVERY DAY (Patient taking differently: Take 50 mg by mouth daily. ) 90 tablet 2  . SUMAtriptan (IMITREX) 100 MG tablet TAKE 1 TABLET BY MOUTH DAILY AS NEEDED FOR MIGRAINE. MAY REPEAT  IN 24 HOURS. 9 tablet 2  . traMADol (ULTRAM) 50 MG tablet Take 1 tablet (50 mg total) by mouth every 6 (six) hours as needed. 30 tablet 0   No current facility-administered medications for this visit.    Facility-Administered Medications Ordered in Other Visits  Medication Dose Route Frequency Provider Last Rate Last Dose  . heparin lock flush 100 unit/mL  500 Units Intracatheter Once PRN Magrinat, Virgie Dad, MD      . PACLitaxel (TAXOL) 162 mg in sodium chloride 0.9 % 250 mL chemo infusion (</= 64m/m2)  80 mg/m2 (Treatment Plan Recorded) Intravenous Once Magrinat, GVirgie Dad MD      . sodium chloride flush (NS) 0.9 % injection 10 mL  10 mL Intracatheter PRN Magrinat, GVirgie Dad MD        OBJECTIVE:  Vitals:   12/25/18 0910  BP: (!) 145/81  Pulse: 70  Resp: 18  Temp: 97.8 F (36.6 C)  SpO2: 99%     Body mass index is 39.31 kg/m.   Wt Readings from Last 3 Encounters:  12/25/18 214 lb 14.4 oz (97.5 kg)  12/18/18 215 lb 6.4 oz (97.7 kg)  12/11/18 215 lb 9.6 oz (97.8 kg)  ECOG FS:2  GENERAL: Patient is a well appearing female in no acute distress HEENT:  Sclerae anicteric.  Oropharynx clear and moist. No ulcerations or evidence of oropharyngeal candidiasis. Neck is supple.  NODES:  No cervical, supraclavicular, or axillary lymphadenopathy palpated.  BREAST EXAM:  S/p left lumpectomy, no sign of local recurrence, right breast benign LUNGS:  Clear to auscultation bilaterally.  No wheezes or rhonchi. HEART:  Regular rate and rhythm. No murmur appreciated. ABDOMEN:  Soft, nontender.  Positive, normoactive bowel sounds. No organomegaly palpated. MSK:  No focal spinal tenderness to palpation. Full range of motion bilaterally in the upper extremities. EXTREMITIES:  No peripheral edema.   SKIN:  Clear with no obvious rashes or skin changes. No nail dyscrasia. NEURO:  Nonfocal. Well oriented.  Appropriate affect.   LAB RESULTS:  CMP     Component Value Date/Time   NA 140 12/25/2018  0905   K 3.7 12/25/2018 0905   CL 106 12/25/2018 0905   CO2 23 12/25/2018 0905   GLUCOSE 109 (H) 12/25/2018 0905   BUN 21 12/25/2018 0905   CREATININE 0.92 12/25/2018 0905   CALCIUM 8.8 (L) 12/25/2018 0905   PROT 6.5 12/25/2018 0905   ALBUMIN 3.7 12/25/2018 0905   AST 23 12/25/2018 0905   ALT 33 12/25/2018 0905   ALKPHOS 70  12/25/2018 0905   BILITOT 0.3 12/25/2018 0905   GFRNONAA >60 12/25/2018 0905   GFRAA >60 12/25/2018 0905    No results found for: TOTALPROTELP, ALBUMINELP, A1GS, A2GS, BETS, BETA2SER, GAMS, MSPIKE, SPEI  No results found for: KPAFRELGTCHN, LAMBDASER, [facility]  Lab Results  Component Value Date   WBC 5.3 12/25/2018   NEUTROABS 2.8 12/25/2018   HGB 10.9 (L) 12/25/2018   HCT 33.6 (L) 12/25/2018   MCV 90.8 12/25/2018   PLT 241 12/25/2018    @LASTCHEMISTRY @  No results found for: LABCA2  No components found for: ZYSAYT016  No results for input(s): INR in the last 168 hours.  No results found for: LABCA2  No results found for: WFU932  No results found for: TFT732  No results found for: KGU542  No results found for: CA2729  No components found for: HGQUANT  No results found for: CEA1 / No results found for: CEA1   No results found for: AFPTUMOR  No results found for: CHROMOGRNA  No results found for: PSA1  Appointment on 12/25/2018  Component Date Value Ref Range Status  . WBC 12/25/2018 5.3  4.0 - 10.5 K/uL Final  . RBC 12/25/2018 3.70* 3.87 - 5.11 MIL/uL Final  . Hemoglobin 12/25/2018 10.9* 12.0 - 15.0 g/dL Final  . HCT 12/25/2018 33.6* 36.0 - 46.0 % Final  . MCV 12/25/2018 90.8  80.0 - 100.0 fL Final  . MCH 12/25/2018 29.5  26.0 - 34.0 pg Final  . MCHC 12/25/2018 32.4  30.0 - 36.0 g/dL Final  . RDW 12/25/2018 15.0  11.5 - 15.5 % Final  . Platelets 12/25/2018 241  150 - 400 K/uL Final  . nRBC 12/25/2018 0.0  0.0 - 0.2 % Final  . Neutrophils Relative % 12/25/2018 53  % Final  . Neutro Abs 12/25/2018 2.8  1.7 - 7.7 K/uL Final   . Lymphocytes Relative 12/25/2018 33  % Final  . Lymphs Abs 12/25/2018 1.8  0.7 - 4.0 K/uL Final  . Monocytes Relative 12/25/2018 8  % Final  . Monocytes Absolute 12/25/2018 0.4  0.1 - 1.0 K/uL Final  . Eosinophils Relative 12/25/2018 3  % Final  . Eosinophils Absolute 12/25/2018 0.2  0.0 - 0.5 K/uL Final  . Basophils Relative 12/25/2018 1  % Final  . Basophils Absolute 12/25/2018 0.1  0.0 - 0.1 K/uL Final  . Immature Granulocytes 12/25/2018 2  % Final  . Abs Immature Granulocytes 12/25/2018 0.09* 0.00 - 0.07 K/uL Final   Performed at [facility], [place] [address] [place], [place] [address]  . Sodium 12/25/2018 140  135 - 145 mmol/L Final  . Potassium 12/25/2018 3.7  3.5 - 5.1 mmol/L Final  . Chloride 12/25/2018 106  98 - 111 mmol/L Final  . CO2 12/25/2018 23  22 - 32 mmol/L Final  . Glucose, Bld 12/25/2018 109* 70 - 99 mg/dL Final  . BUN 12/25/2018 21  8 - 23 mg/dL Final  . Creatinine 12/25/2018 0.92  0.44 - 1.00 mg/dL Final  . Calcium 12/25/2018 8.8* 8.9 - 10.3 mg/dL Final  . Total Protein 12/25/2018 6.5  6.5 - 8.1 g/dL Final  . Albumin 12/25/2018 3.7  3.5 - 5.0 g/dL Final  . AST 12/25/2018 23  15 - 41 U/L Final  . ALT 12/25/2018 33  0 - 44 U/L Final  . Alkaline Phosphatase 12/25/2018 70  38 - 126 U/L Final  . Total Bilirubin 12/25/2018 0.3  0.3 - 1.2 mg/dL Final  . GFR, Est Non  Af Am 12/25/2018 >60  >60 mL/min Final  . GFR, Est AFR Am 12/25/2018 >60  >60 mL/min Final  . Anion gap 12/25/2018 11  5 - 15 Final   Performed at [facility], [place] [address] [place], [place] [address]    (this displays the last labs from the last 3 days)  No results found for: TOTALPROTELP, ALBUMINELP, A1GS, A2GS, BETS, BETA2SER, GAMS, MSPIKE, SPEI (this displays SPEP labs)  No results found for: KPAFRELGTCHN, LAMBDASER, KAPLAMBRATIO (kappa/lambda light chains)  No results found for: HGBA, HGBA2QUANT, HGBFQUANT, HGBSQUAN (Hemoglobinopathy  evaluation)   No results found for: LDH  No results found for: IRON, TIBC, IRONPCTSAT (Iron and TIBC)  No results found for: FERRITIN  Urinalysis    Component Value Date/Time   COLORURINE YELLOW 09/13/2011 [place] 09/13/2011 0952   LABSPEC 1.010 09/13/2011 0952   PHURINE 6.5 09/13/2011 [place] 09/13/2011 [place] 09/13/2011 0952   HGBUR negative 04/03/2009 0949   BILIRUBINUR n 09/15/2012 [place] 09/13/2011 0952   PROTEINUR n 09/15/2012 0832   PROTEINUR NEGATIVE 09/13/2011 0952   UROBILINOGEN 0.2 09/15/2012 0832   UROBILINOGEN 0.2 09/13/2011 0952   NITRITE n 09/15/2012 0832   NITRITE NEGATIVE 09/13/2011 0952   LEUKOCYTESUR Negative 09/15/2012 0832     STUDIES:  No results found.   ELIGIBLE FOR AVAILABLE RESEARCH PROTOCOL: no   ASSESSMENT: 63 y.o. [place] woman status post left breast lower inner quadrant biopsy 07/31/2018 for a clinical T1b N0, stage IA invasive ductal carcinoma, grade 3, triple positive, with an MIB-1 of 75%.  (1) status post left lumpectomy and sentinel lymph node sampling 09/07/2018 for a pT1c pN0, stage IA invasive ductal carcinoma, grade 3, with negative margins.  (a) a total of 5 sentinel lymph nodes were removed  (2) adjuvant chemotherapy consisting of paclitaxel weekly x12 with trastuzumab every 21 days starting 10/02/2018  (3) trastuzumab to be continued to complete 1 year  (a) echocardiogram 08/17/2018 shows an ejection fraction of 60-65%  (b) echocardiogram on 11/17/2018 shows an ejection fraction of 60-65%  (4) adjuvant radiation to follow-up  (5) antiestrogens to start at the completion of local treatment   PLAN:  Tiffany Montes is doing well today.  I congratulated her on today being her final cycle of adjuvant chemotherapy.  She tolerated it well and didn't develop any peripheral neuropathy.  Her labs are stable today.  She will proceed with her last Abraxane today.   Tiffany Montes and I reviewed her upcoming plan which includes continue every three week Trastuzumab, meeting with radiation oncology to start adjuvant radiation in April, and then seeing Dr. Jana Hakim in May, after her echocardiogram, and to discuss anti estrogen therapy.    Tiffany Montes will return in one week for labs and Trastuzumab.  She knows to call our office for any concerns prior to her next appointment with [place].    A total of (20) minutes of face-to-face time was spent with this patient with greater than 50% of that time in counseling and care-coordination.   Wilber Bihari, NP  12/25/18 11:15 AM Medical Oncology and Hematology Tiffany Montes [address] [place], [place] [address] Tel. [phone]    Fax. [phone]

## 2018-12-28 ENCOUNTER — Other Ambulatory Visit: Payer: Self-pay | Admitting: Family Medicine

## 2018-12-29 ENCOUNTER — Telehealth: Payer: Self-pay | Admitting: Family Medicine

## 2018-12-29 MED ORDER — AMPHETAMINE-DEXTROAMPHET ER 30 MG PO CP24: ORAL_CAPSULE | ORAL | 0 refills | Status: DC

## 2018-12-29 NOTE — Telephone Encounter (Signed)
Last OV 11/11/18, No future OV  Last filled 11/11/18, # 30 with 0 refills

## 2018-12-29 NOTE — Telephone Encounter (Signed)
Refill sent.

## 2018-12-29 NOTE — Telephone Encounter (Signed)
Patient called requesting adderall refill.

## 2019-01-01 ENCOUNTER — Inpatient Hospital Stay: Payer: BLUE CROSS/BLUE SHIELD

## 2019-01-01 ENCOUNTER — Other Ambulatory Visit: Payer: Self-pay

## 2019-01-01 VITALS — BP 149/90 | HR 74 | Temp 97.7°F | Resp 19

## 2019-01-01 DIAGNOSIS — R5383 Other fatigue: Secondary | ICD-10-CM | POA: Diagnosis not present

## 2019-01-01 DIAGNOSIS — Z803 Family history of malignant neoplasm of breast: Secondary | ICD-10-CM | POA: Diagnosis not present

## 2019-01-01 DIAGNOSIS — E785 Hyperlipidemia, unspecified: Secondary | ICD-10-CM | POA: Diagnosis not present

## 2019-01-01 DIAGNOSIS — C50312 Malignant neoplasm of lower-inner quadrant of left female breast: Secondary | ICD-10-CM | POA: Diagnosis not present

## 2019-01-01 DIAGNOSIS — Z95828 Presence of other vascular implants and grafts: Secondary | ICD-10-CM

## 2019-01-01 DIAGNOSIS — Z5112 Encounter for antineoplastic immunotherapy: Secondary | ICD-10-CM | POA: Diagnosis not present

## 2019-01-01 DIAGNOSIS — Z5111 Encounter for antineoplastic chemotherapy: Secondary | ICD-10-CM | POA: Diagnosis not present

## 2019-01-01 DIAGNOSIS — Z90710 Acquired absence of both cervix and uterus: Secondary | ICD-10-CM | POA: Diagnosis not present

## 2019-01-01 DIAGNOSIS — C50212 Malignant neoplasm of upper-inner quadrant of left female breast: Secondary | ICD-10-CM

## 2019-01-01 DIAGNOSIS — F329 Major depressive disorder, single episode, unspecified: Secondary | ICD-10-CM | POA: Diagnosis not present

## 2019-01-01 DIAGNOSIS — Z90722 Acquired absence of ovaries, bilateral: Secondary | ICD-10-CM | POA: Diagnosis not present

## 2019-01-01 DIAGNOSIS — Z170 Estrogen receptor positive status [ER+]: Secondary | ICD-10-CM | POA: Diagnosis not present

## 2019-01-01 DIAGNOSIS — Z801 Family history of malignant neoplasm of trachea, bronchus and lung: Secondary | ICD-10-CM | POA: Diagnosis not present

## 2019-01-01 DIAGNOSIS — E039 Hypothyroidism, unspecified: Secondary | ICD-10-CM | POA: Diagnosis not present

## 2019-01-01 DIAGNOSIS — I10 Essential (primary) hypertension: Secondary | ICD-10-CM | POA: Diagnosis not present

## 2019-01-01 DIAGNOSIS — Z79899 Other long term (current) drug therapy: Secondary | ICD-10-CM | POA: Diagnosis not present

## 2019-01-01 LAB — COMPREHENSIVE METABOLIC PANEL
ALT: 28 U/L (ref 0–44)
AST: 20 U/L (ref 15–41)
Albumin: 3.7 g/dL (ref 3.5–5.0)
Alkaline Phosphatase: 77 U/L (ref 38–126)
Anion gap: 10 (ref 5–15)
BUN: 16 mg/dL (ref 8–23)
CO2: 25 mmol/L (ref 22–32)
Calcium: 9.5 mg/dL (ref 8.9–10.3)
Chloride: 105 mmol/L (ref 98–111)
Creatinine, Ser: 0.97 mg/dL (ref 0.44–1.00)
Glucose, Bld: 114 mg/dL — ABNORMAL HIGH (ref 70–99)
Potassium: 4.2 mmol/L (ref 3.5–5.1)
Sodium: 140 mmol/L (ref 135–145)
Total Bilirubin: 0.3 mg/dL (ref 0.3–1.2)
Total Protein: 6.8 g/dL (ref 6.5–8.1)

## 2019-01-01 LAB — CBC WITH DIFFERENTIAL/PLATELET
ABS IMMATURE GRANULOCYTES: 0.12 10*3/uL — AB (ref 0.00–0.07)
BASOS PCT: 1 %
Basophils Absolute: 0.1 10*3/uL (ref 0.0–0.1)
Eosinophils Absolute: 0.2 10*3/uL (ref 0.0–0.5)
Eosinophils Relative: 3 %
HCT: 37.5 % (ref 36.0–46.0)
Hemoglobin: 12 g/dL (ref 12.0–15.0)
Immature Granulocytes: 2 %
Lymphocytes Relative: 32 %
Lymphs Abs: 2 10*3/uL (ref 0.7–4.0)
MCH: 28.9 pg (ref 26.0–34.0)
MCHC: 32 g/dL (ref 30.0–36.0)
MCV: 90.4 fL (ref 80.0–100.0)
Monocytes Absolute: 0.5 10*3/uL (ref 0.1–1.0)
Monocytes Relative: 8 %
NEUTROS ABS: 3.4 10*3/uL (ref 1.7–7.7)
Neutrophils Relative %: 54 %
PLATELETS: 294 10*3/uL (ref 150–400)
RBC: 4.15 MIL/uL (ref 3.87–5.11)
RDW: 15 % (ref 11.5–15.5)
WBC: 6.2 10*3/uL (ref 4.0–10.5)
nRBC: 0 % (ref 0.0–0.2)

## 2019-01-01 MED ORDER — DIPHENHYDRAMINE HCL 25 MG PO CAPS
ORAL_CAPSULE | ORAL | Status: AC
  Filled 2019-01-01: qty 1

## 2019-01-01 MED ORDER — ACETAMINOPHEN 325 MG PO TABS
650.0000 mg | ORAL_TABLET | Freq: Once | ORAL | Status: AC
  Administered 2019-01-01: 650 mg via ORAL

## 2019-01-01 MED ORDER — DIPHENHYDRAMINE HCL 25 MG PO CAPS
25.0000 mg | ORAL_CAPSULE | Freq: Once | ORAL | Status: AC
  Administered 2019-01-01: 25 mg via ORAL

## 2019-01-01 MED ORDER — ACETAMINOPHEN 325 MG PO TABS
ORAL_TABLET | ORAL | Status: AC
  Filled 2019-01-01: qty 2

## 2019-01-01 MED ORDER — SODIUM CHLORIDE 0.9 % IV SOLN
Freq: Once | INTRAVENOUS | Status: AC
  Administered 2019-01-01: 10:00:00 via INTRAVENOUS
  Filled 2019-01-01: qty 250

## 2019-01-01 MED ORDER — TRASTUZUMAB CHEMO 150 MG IV SOLR
600.0000 mg | Freq: Once | INTRAVENOUS | Status: AC
  Administered 2019-01-01: 600 mg via INTRAVENOUS
  Filled 2019-01-01: qty 28.57

## 2019-01-01 MED ORDER — SODIUM CHLORIDE 0.9% FLUSH
10.0000 mL | INTRAVENOUS | Status: DC | PRN
  Administered 2019-01-01: 10 mL
  Filled 2019-01-01: qty 10

## 2019-01-01 MED ORDER — HEPARIN SOD (PORK) LOCK FLUSH 100 UNIT/ML IV SOLN
500.0000 [IU] | Freq: Once | INTRAVENOUS | Status: AC | PRN
  Administered 2019-01-01: 500 [IU]
  Filled 2019-01-01: qty 5

## 2019-01-01 NOTE — Patient Instructions (Signed)
[facility] Discharge Instructions for Patients Receiving Chemotherapy Today you received the following chemotherapy agents:  Herceptin To help prevent nausea and vomiting after your treatment, we encourage you to take your nausea medication as prescribed.   If you develop nausea and vomiting that is not controlled by your nausea medication, call the clinic.   BELOW ARE SYMPTOMS THAT SHOULD BE REPORTED IMMEDIATELY:  *FEVER GREATER THAN 100.5 F  *CHILLS WITH OR WITHOUT FEVER  NAUSEA AND VOMITING THAT IS NOT CONTROLLED WITH YOUR NAUSEA MEDICATION  *UNUSUAL SHORTNESS OF BREATH  *UNUSUAL BRUISING OR BLEEDING  TENDERNESS IN MOUTH AND THROAT WITH OR WITHOUT PRESENCE OF ULCERS  *URINARY PROBLEMS  *BOWEL PROBLEMS  UNUSUAL RASH Items with * indicate a potential emergency and should be followed up as soon as possible.  Feel free to call the clinic should you have any questions or concerns. The clinic phone number is [phone].  Please show the CHEMO ALERT CARD at check-in to the Emergency Department and triage nurse.   

## 2019-01-11 ENCOUNTER — Telehealth: Payer: Self-pay | Admitting: *Deleted

## 2019-01-11 NOTE — Telephone Encounter (Signed)
Medical records faxed to [facility]; [place] [phone]

## 2019-01-12 NOTE — Progress Notes (Signed)
Radiation Oncology         [phone]) [phone] ________________________________   Initial Follow Up Consultation - Conducted via telephone due to current COVID-19 concerns for limiting patient exposure  I spoke with the patient to conduct this consult visit via telephone to spare the patient unnecessary potential exposure in the healthcare setting during the current COVID-19 pandemic. The patient was notified in advance and was offered a [place] meeting to allow for face to face communication but unfortunately reported that they did not have the appropriate resources/technology to support such a visit and instead preferred to proceed with a telephone consult.     Name: Tiffany Montes        MRN: [id number]  Date of Service: 01/13/2019 DOB: 1956/10/07  [id number], Alinda Sierras, MD  Magrinat, Virgie Dad, MD     REFERRING PHYSICIAN: Magrinat, Virgie Dad, MD   DIAGNOSIS: The encounter diagnosis was Malignant neoplasm of upper-inner quadrant of left breast in female, estrogen receptor positive ([place]).   HISTORY OF PRESENT ILLNESS: Tiffany Montes is a 63 y.o. female who was originally seen in the multidisciplinary breast clinic for a new diagnosis of left breast cancer. The patient was noted to have a screening detected mass in the left breast. And  diagnostic imaging revealed this at 9:30, and measured 8 x 8 x 7 mm.  Her axilla was negative for adenopathy.  She underwent a biopsy on 07/21/2018 which revealed a grade 3 invasive ductal carcinoma, triple positive with a Ki-67 of 75%.  She proceeded with  left lumpectomy and sentinel lymph node biopsy on 09/07/2018.  Final pathology revealed a 1.1 cm grade 3 invasive ductal carcinoma with high-grade DCIS, and her margins were clear of carcinoma by 1 mm.  She had 5 lymph nodes sampled all were negative for disease.  Given the triple positive nature of her disease, she went on to receive adjuvant chemotherapy between 10/02/2018, and completed her course on 12/25/2018.   She was contacted to coordinate WebEx follow-up appointment, but declined.  She is contacted by phone to complete this discussion.    PREVIOUS RADIATION THERAPY: No   PAST MEDICAL HISTORY:  Past Medical History:  Diagnosis Date   Anxiety    Attention deficit disorder    Cancer ([place])    Left breast CA   Depression    Gallstones 09/05/2011   Hyperlipidemia    Hypertension    Hypothyroidism    Migraines    PONV (postoperative nausea and vomiting)    diffficulty waking up   Thyroid disease    hypothyroid       PAST SURGICAL HISTORY: Past Surgical History:  Procedure Laterality Date   ABDOMINAL HYSTERECTOMY  2003   BREAST BIOPSY Left 2014   benign   BREAST LUMPECTOMY WITH RADIOACTIVE SEED AND SENTINEL LYMPH NODE BIOPSY Left 09/07/2018   Procedure: LEFT BREAST LUMPECTOMY WITH RADIOACTIVE SEED AND AXILLARY SENTINEL LYMPH NODE BIOPSY,INJECT BLUE DYE LEFT BREAST;  Surgeon: Fanny Skates, MD;  Location: [facility];  Service: General;  Laterality: Left;   CHOLECYSTECTOMY  09/26/2011   Procedure: LAPAROSCOPIC CHOLECYSTECTOMY WITH INTRAOPERATIVE CHOLANGIOGRAM;  Surgeon: Haywood Lasso, MD;  Location: [facility];  Service: General;  Laterality: N/A;   COLONOSCOPY     CYSTIC HYGROMA EXCISION     DENTAL SURGERY     skin graft from top of mouth to lower gums   PORTACATH PLACEMENT N/A 09/07/2018   Procedure: INSERTION PORT-A-CATH WITH [place];  Surgeon: Fanny Skates, MD;  Location: [facility];  Service: General;  Laterality: N/A;     FAMILY HISTORY:  Family History  Problem Relation Age of Onset   Diabetes Father    Cancer Father        lung    Heart disease Father      SOCIAL HISTORY:  reports that she has never smoked. She has never used smokeless tobacco. She reports current alcohol use. She reports that she does not use drugs.  She is single and lives in [place].  She works for [employer] but she's on medical leave.   ALLERGIES: Morphine  sulfate   MEDICATIONS:  Current Outpatient Medications  Medication Sig Dispense Refill   amphetamine-dextroamphetamine (ADDERALL XR) 30 MG 24 hr capsule Take one tablet by mouth daily. 30 capsule 0   augmented betamethasone dipropionate (DIPROLENE-AF) 0.05 % cream Apply 1 application topically 2 (two) times daily as needed (for eczema). 30 g 2   benzonatate (TESSALON) 200 MG capsule TAKE 1 CAPSULE AS NEEDED FOR ALLERGIES(COUGH) (Patient taking differently: Take 200 mg by mouth daily as needed (allergies/cough). ) 60 capsule 0   BIOTIN PO Take 1 tablet by mouth daily.     buPROPion (WELLBUTRIN XL) 300 MG 24 hr tablet TAKE 1 TABLET BY MOUTH EVERY DAY 90 tablet 0   Cholecalciferol (VITAMIN D3 PO) Take 1 tablet by mouth daily.     clonazePAM (KLONOPIN) 0.5 MG tablet TAKE 1 TABLET BY MOUTH AT BEDTIME AS NEEDED (Patient taking differently: Take 0.5 mg by mouth daily as needed (anxiety/panic attacks.). ) 30 tablet 5   doxycycline (VIBRAMYCIN) 100 MG capsule Take 1 capsule (100 mg total) by mouth 2 (two) times daily. 20 capsule 0   fluticasone (FLONASE) 50 MCG/ACT nasal spray Place 2 sprays into both nostrils daily as needed for allergies. 16 g 2   gabapentin (NEURONTIN) 300 MG capsule Take 1 capsule (300 mg total) by mouth at bedtime. 90 capsule 4   levothyroxine (SYNTHROID, LEVOTHROID) 125 MCG tablet TAKE 1 TABLET BY MOUTH EVERY DAY 90 tablet 0   lidocaine-prilocaine (EMLA) cream Apply to affected area once (Patient taking differently: Apply 1 application topically daily as needed (prior to port is accessed.). ) 30 g 3   losartan-hydrochlorothiazide (HYZAAR) 50-12.5 MG tablet TAKE 1 TABLET BY MOUTH EVERY DAY 90 tablet 1   magic mouthwash w/lidocaine SOLN Take 5 mLs by mouth 4 (four) times daily as needed for mouth pain. Gargle and spit. 240 mL 2   Multiple Vitamins-Minerals (MULTIVITAMINS THER. W/MINERALS) TABS Take 1 tablet by mouth daily.       prochlorperazine (COMPAZINE) 10 MG  tablet Take 1 tablet (10 mg total) by mouth every 6 (six) hours as needed (Nausea or vomiting). 30 tablet 1   sertraline (ZOLOFT) 50 MG tablet TAKE 1 TABLET BY MOUTH EVERY DAY (Patient taking differently: Take 50 mg by mouth daily. ) 90 tablet 2   SUMAtriptan (IMITREX) 100 MG tablet TAKE 1 TABLET BY MOUTH DAILY AS NEEDED FOR MIGRAINE. MAY REPEAT IN 24 HOURS. 9 tablet 2   traMADol (ULTRAM) 50 MG tablet Take 1 tablet (50 mg total) by mouth every 6 (six) hours as needed. 30 tablet 0   No current facility-administered medications for this visit.      REVIEW OF SYSTEMS: On review of systems, the patient reports that she is doing well overall. She is recovering from chemo fatigue and has has a little bit of right hand numbness. She denies any chest pain, shortness of breath, cough, fevers, chills, night  sweats, unintended weight changes. She denies any bowel or bladder disturbances, and denies abdominal pain, nausea or vomiting. She denies any new musculoskeletal or joint aches or pains. A complete review of systems is obtained and is otherwise negative.     PHYSICAL EXAM:  Unable to assess given type of encounter    ECOG = 0  0 - Asymptomatic (Fully active, able to carry on all predisease activities without restriction)  1 - Symptomatic but completely ambulatory (Restricted in physically strenuous activity but ambulatory and able to carry out work of a light or sedentary nature. For example, light housework, office work)  2 - Symptomatic, <50% in bed during the day (Ambulatory and capable of all self care but unable to carry out any work activities. Up and about more than 50% of waking hours)  3 - Symptomatic, >50% in bed, but not bedbound (Capable of only limited self-care, confined to bed or chair 50% or more of waking hours)  4 - Bedbound (Completely disabled. Cannot carry on any self-care. Totally confined to bed or chair)  5 - Death   Eustace Pen MM, Creech RH, Tormey DC, et al. [phone]).  "Toxicity and response criteria of the [facility]". [place] Oncol. 5 (6): [phone]    LABORATORY DATA:  Lab Results  Component Value Date   WBC 6.2 01/01/2019   HGB 12.0 01/01/2019   HCT 37.5 01/01/2019   MCV 90.4 01/01/2019   PLT 294 01/01/2019   Lab Results  Component Value Date   NA 140 01/01/2019   K 4.2 01/01/2019   CL 105 01/01/2019   CO2 25 01/01/2019   Lab Results  Component Value Date   ALT 28 01/01/2019   AST 20 01/01/2019   ALKPHOS 77 01/01/2019   BILITOT 0.3 01/01/2019      RADIOGRAPHY: No results found.     IMPRESSION/PLAN: 1. 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 Dr. Lisbeth Renshaw is aware of her case, and has reviewed this.  At this juncture, she would benefit from radiotherapy to the breast to reduce the risk of local recurrence.  We discussed the risks, benefits, short, and long term effects of radiotherapy, and the patient is interested in proceeding.  She will continue Herceptin as well. Dr. Lisbeth Renshaw recommends a course of 4 weeks of radiotherapy with deep inspiration breath hold technique.  She will simulate on 01/28/2019   Given current concerns for patient exposure during the COVID-19 pandemic, this encounter was conducted via telephone.  The patient has given verbal consent for this type of encounter. The time spent during this encounter was 30 minutes and 50% of that time was spent in the coordination of his care. The attendants for this meeting include Shona Simpson, [facility] and Anola Gurney  During the encounter, Shona Simpson [facility] was located at [facility]iation Oncology Department.  Tiffany Montes  was located at home.    Carola Rhine, PAC

## 2019-01-13 ENCOUNTER — Ambulatory Visit
Admission: RE | Admit: 2019-01-13 | Discharge: 2019-01-13 | Disposition: A | Discharge status: Home / Self Care | Payer: BLUE CROSS/BLUE SHIELD | Source: Ambulatory Visit | Attending: Radiation Oncology | Admitting: Radiation Oncology

## 2019-01-13 ENCOUNTER — Encounter: Payer: Self-pay | Admitting: Radiation Oncology

## 2019-01-13 ENCOUNTER — Encounter: Payer: Self-pay | Admitting: *Deleted

## 2019-01-13 ENCOUNTER — Other Ambulatory Visit: Payer: Self-pay

## 2019-01-13 VITALS — Ht 62.0 in | Wt 212.0 lb

## 2019-01-13 DIAGNOSIS — I10 Essential (primary) hypertension: Secondary | ICD-10-CM | POA: Insufficient documentation

## 2019-01-13 DIAGNOSIS — G43909 Migraine, unspecified, not intractable, without status migrainosus: Secondary | ICD-10-CM | POA: Insufficient documentation

## 2019-01-13 DIAGNOSIS — Z9889 Other specified postprocedural states: Secondary | ICD-10-CM | POA: Diagnosis not present

## 2019-01-13 DIAGNOSIS — F329 Major depressive disorder, single episode, unspecified: Secondary | ICD-10-CM | POA: Insufficient documentation

## 2019-01-13 DIAGNOSIS — F988 Other specified behavioral and emotional disorders with onset usually occurring in childhood and adolescence: Secondary | ICD-10-CM | POA: Insufficient documentation

## 2019-01-13 DIAGNOSIS — C50212 Malignant neoplasm of upper-inner quadrant of left female breast: Secondary | ICD-10-CM | POA: Insufficient documentation

## 2019-01-13 DIAGNOSIS — F419 Anxiety disorder, unspecified: Secondary | ICD-10-CM | POA: Insufficient documentation

## 2019-01-13 DIAGNOSIS — Z9221 Personal history of antineoplastic chemotherapy: Secondary | ICD-10-CM | POA: Diagnosis not present

## 2019-01-13 DIAGNOSIS — Z170 Estrogen receptor positive status [ER+]: Secondary | ICD-10-CM | POA: Insufficient documentation

## 2019-01-13 DIAGNOSIS — Z79899 Other long term (current) drug therapy: Secondary | ICD-10-CM | POA: Insufficient documentation

## 2019-01-13 DIAGNOSIS — C50811 Malignant neoplasm of overlapping sites of right female breast: Secondary | ICD-10-CM

## 2019-01-13 DIAGNOSIS — E039 Hypothyroidism, unspecified: Secondary | ICD-10-CM | POA: Insufficient documentation

## 2019-01-13 DIAGNOSIS — Z510 Encounter for antineoplastic radiation therapy: Secondary | ICD-10-CM | POA: Insufficient documentation

## 2019-01-13 DIAGNOSIS — E785 Hyperlipidemia, unspecified: Secondary | ICD-10-CM | POA: Insufficient documentation

## 2019-01-13 DIAGNOSIS — Z79890 Hormone replacement therapy (postmenopausal): Secondary | ICD-10-CM | POA: Insufficient documentation

## 2019-01-13 HISTORY — DX: Malignant neoplasm of unspecified site of unspecified female breast: C50.919

## 2019-01-13 NOTE — Progress Notes (Signed)
See progress note under physician encounter. 

## 2019-01-14 ENCOUNTER — Ambulatory Visit: Payer: BLUE CROSS/BLUE SHIELD | Admitting: Radiation Oncology

## 2019-01-14 ENCOUNTER — Ambulatory Visit: Payer: BLUE CROSS/BLUE SHIELD

## 2019-01-22 ENCOUNTER — Inpatient Hospital Stay: Payer: BLUE CROSS/BLUE SHIELD

## 2019-01-22 ENCOUNTER — Other Ambulatory Visit: Payer: Self-pay

## 2019-01-22 ENCOUNTER — Encounter: Payer: Self-pay | Admitting: *Deleted

## 2019-01-22 ENCOUNTER — Inpatient Hospital Stay: Payer: BLUE CROSS/BLUE SHIELD | Attending: Oncology

## 2019-01-22 VITALS — BP 132/79 | HR 74 | Temp 98.0°F | Resp 16 | Ht 62.0 in | Wt 216.2 lb

## 2019-01-22 DIAGNOSIS — C50212 Malignant neoplasm of upper-inner quadrant of left female breast: Secondary | ICD-10-CM

## 2019-01-22 DIAGNOSIS — Z170 Estrogen receptor positive status [ER+]: Principal | ICD-10-CM

## 2019-01-22 DIAGNOSIS — C50312 Malignant neoplasm of lower-inner quadrant of left female breast: Secondary | ICD-10-CM | POA: Diagnosis not present

## 2019-01-22 DIAGNOSIS — Z5112 Encounter for antineoplastic immunotherapy: Secondary | ICD-10-CM | POA: Insufficient documentation

## 2019-01-22 DIAGNOSIS — Z95828 Presence of other vascular implants and grafts: Secondary | ICD-10-CM

## 2019-01-22 LAB — CMP (CANCER CENTER ONLY)
ALT: 22 U/L (ref 0–44)
AST: 20 U/L (ref 15–41)
Albumin: 3.7 g/dL (ref 3.5–5.0)
Alkaline Phosphatase: 77 U/L (ref 38–126)
Anion gap: 11 (ref 5–15)
BUN: 25 mg/dL — ABNORMAL HIGH (ref 8–23)
CO2: 24 mmol/L (ref 22–32)
Calcium: 8.9 mg/dL (ref 8.9–10.3)
Chloride: 105 mmol/L (ref 98–111)
Creatinine: 0.86 mg/dL (ref 0.44–1.00)
GFR, Est AFR Am: >60 mL/min (ref >60)
GFR, Estimated: >60 mL/min (ref >60)
Glucose, Bld: 117 mg/dL — ABNORMAL HIGH (ref 70–99)
Potassium: 4.1 mmol/L (ref 3.5–5.1)
Sodium: 140 mmol/L (ref 135–145)
Total Bilirubin: 0.3 mg/dL (ref 0.3–1.2)
Total Protein: 6.6 g/dL (ref 6.5–8.1)

## 2019-01-22 LAB — CBC WITH DIFFERENTIAL/PLATELET
Abs Immature Granulocytes: 0.02 10*3/uL (ref 0.00–0.07)
Basophils Absolute: 0.1 10*3/uL (ref 0.0–0.1)
Basophils Relative: 1 %
Eosinophils Absolute: 0.4 10*3/uL (ref 0.0–0.5)
Eosinophils Relative: 6 %
HCT: 35.5 % — ABNORMAL LOW (ref 36.0–46.0)
Hemoglobin: 11.3 g/dL — ABNORMAL LOW (ref 12.0–15.0)
Immature Granulocytes: 0 %
Lymphocytes Relative: 26 %
Lymphs Abs: 1.6 10*3/uL (ref 0.7–4.0)
MCH: 28.8 pg (ref 26.0–34.0)
MCHC: 31.8 g/dL (ref 30.0–36.0)
MCV: 90.3 fL (ref 80.0–100.0)
Monocytes Absolute: 0.7 10*3/uL (ref 0.1–1.0)
Monocytes Relative: 11 %
Neutro Abs: 3.6 10*3/uL (ref 1.7–7.7)
Neutrophils Relative %: 56 %
Platelets: 187 10*3/uL (ref 150–400)
RBC: 3.93 MIL/uL (ref 3.87–5.11)
RDW: 14.2 % (ref 11.5–15.5)
WBC: 6.4 10*3/uL (ref 4.0–10.5)
nRBC: 0 % (ref 0.0–0.2)

## 2019-01-22 MED ORDER — SODIUM CHLORIDE 0.9% FLUSH
10.0000 mL | INTRAVENOUS | Status: DC | PRN
  Administered 2019-01-22: 10 mL
  Filled 2019-01-22: qty 10

## 2019-01-22 MED ORDER — DIPHENHYDRAMINE HCL 25 MG PO CAPS
ORAL_CAPSULE | ORAL | Status: AC
  Filled 2019-01-22: qty 1

## 2019-01-22 MED ORDER — SODIUM CHLORIDE 0.9% FLUSH
10.0000 mL | INTRAVENOUS | Status: DC | PRN
  Administered 2019-01-22: 09:00:00 10 mL
  Filled 2019-01-22: qty 10

## 2019-01-22 MED ORDER — ACETAMINOPHEN 325 MG PO TABS
650.0000 mg | ORAL_TABLET | Freq: Once | ORAL | Status: AC
  Administered 2019-01-22: 650 mg via ORAL

## 2019-01-22 MED ORDER — HEPARIN SOD (PORK) LOCK FLUSH 100 UNIT/ML IV SOLN
500.0000 [IU] | Freq: Once | INTRAVENOUS | Status: AC | PRN
  Administered 2019-01-22: 500 [IU]
  Filled 2019-01-22: qty 5

## 2019-01-22 MED ORDER — DIPHENHYDRAMINE HCL 25 MG PO CAPS
25.0000 mg | ORAL_CAPSULE | Freq: Once | ORAL | Status: AC
  Administered 2019-01-22: 25 mg via ORAL

## 2019-01-22 MED ORDER — TRASTUZUMAB CHEMO 150 MG IV SOLR
600.0000 mg | Freq: Once | INTRAVENOUS | Status: AC
  Administered 2019-01-22: 600 mg via INTRAVENOUS
  Filled 2019-01-22: qty 28.57

## 2019-01-22 MED ORDER — ACETAMINOPHEN 325 MG PO TABS
ORAL_TABLET | ORAL | Status: AC
  Filled 2019-01-22: qty 2

## 2019-01-22 MED ORDER — SODIUM CHLORIDE 0.9 % IV SOLN
Freq: Once | INTRAVENOUS | Status: AC
  Administered 2019-01-22: 10:00:00 via INTRAVENOUS
  Filled 2019-01-22: qty 250

## 2019-01-22 NOTE — Patient Instructions (Addendum)
[facility] Discharge Instructions for Patients Receiving Chemotherapy  Today you received the following chemotherapy agents:  trastuzumab (Herceptin)  To help prevent nausea and vomiting after your treatment, we encourage you to take your nausea medication as prescribed.   If you develop nausea and vomiting that is not controlled by your nausea medication, call the clinic.   BELOW ARE SYMPTOMS THAT SHOULD BE REPORTED IMMEDIATELY:  *FEVER GREATER THAN 100.5 F  *CHILLS WITH OR WITHOUT FEVER  NAUSEA AND VOMITING THAT IS NOT CONTROLLED WITH YOUR NAUSEA MEDICATION  *UNUSUAL SHORTNESS OF BREATH  *UNUSUAL BRUISING OR BLEEDING  TENDERNESS IN MOUTH AND THROAT WITH OR WITHOUT PRESENCE OF ULCERS  *URINARY PROBLEMS  *BOWEL PROBLEMS  UNUSUAL RASH Items with * indicate a potential emergency and should be followed up as soon as possible.  Feel free to call the clinic should you have any questions or concerns. The clinic phone number is [phone].  Please show the [facility] at check-in to the Emergency Department and triage nurse.   Coronavirus (COVID-19) Are you at risk?  Are you at risk for the Coronavirus (COVID-19)?  To be considered HIGH RISK for Coronavirus (COVID-19), you have to meet the following criteria:  . Traveled to [place], [place], [place], [place] or [place]; or in the [place] to [place], [place], [place], or [place]; and have fever, cough, and shortness of breath within the last 2 weeks of travel OR . Been in close contact with a person diagnosed with COVID-19 within the last 2 weeks and have fever, cough, and shortness of breath . IF YOU DO NOT MEET THESE CRITERIA, YOU ARE CONSIDERED LOW RISK FOR COVID-19.  What to do if you are HIGH RISK for COVID-19?  Marland Kitchen If you are having a medical emergency, call 911. . Seek medical care right away. Before you go to a doctor's office, urgent care or emergency department, call ahead and tell  them about your recent travel, contact with someone diagnosed with COVID-19, and your symptoms. You should receive instructions from your physician's office regarding next steps of care.  . When you arrive at healthcare provider, tell the healthcare staff immediately you have returned from visiting [place], [place], [place], [place] or [place]; or traveled in the [place] to [place], [place], [place], or [place]; in the last two weeks or you have been in close contact with a person diagnosed with COVID-19 in the last 2 weeks.   . Tell the health care staff about your symptoms: fever, cough and shortness of breath. . After you have been seen by a medical provider, you will be either: o Tested for (COVID-19) and discharged home on quarantine except to seek medical care if symptoms worsen, and asked to  - Stay home and avoid contact with others until you get your results (4-5 days)  - Avoid travel on public transportation if possible (such as bus, train, or airplane) or o Sent to the Emergency Department by EMS for evaluation, COVID-19 testing, and possible admission depending on your condition and test results.  What to do if you are LOW RISK for COVID-19?  Reduce your risk of any infection by using the same precautions used for avoiding the common cold or flu:  Marland Kitchen Wash your hands often with soap and warm water for at least 20 seconds.  If soap and water are not readily available, use an alcohol-based hand sanitizer with at least 60% alcohol.  . If  coughing or sneezing, cover your mouth and nose by coughing or sneezing into the elbow areas of your shirt or coat, into a tissue or into your sleeve (not your hands). . Avoid shaking hands with others and consider head nods or verbal greetings only. . Avoid touching your eyes, nose, or mouth with unwashed hands.  . Avoid close contact with people who are sick. . Avoid places or events with large numbers of people in one location, like concerts or  sporting events. . Carefully consider travel plans you have or are making. . If you are planning any travel outside or inside the [place], visit the CDC's Travelers' Health webpage for the latest health notices. . If you have some symptoms but not all symptoms, continue to monitor at home and seek medical attention if your symptoms worsen. . If you are having a medical emergency, call 911.   ADDITIONAL HEALTHCARE OPTIONS FOR PATIENTS  [facility] / e-Visit: [email or web address]         [facility]: [phone]  [facility]: [phone]                   [facility]: [phone]   

## 2019-01-28 ENCOUNTER — Ambulatory Visit
Admission: RE | Admit: 2019-01-28 | Discharge: 2019-01-28 | Disposition: A | Discharge status: Home / Self Care | Payer: BLUE CROSS/BLUE SHIELD | Source: Ambulatory Visit | Attending: Radiation Oncology | Admitting: Radiation Oncology

## 2019-01-28 ENCOUNTER — Other Ambulatory Visit: Payer: Self-pay

## 2019-01-28 DIAGNOSIS — C50212 Malignant neoplasm of upper-inner quadrant of left female breast: Secondary | ICD-10-CM | POA: Diagnosis not present

## 2019-01-28 DIAGNOSIS — Z170 Estrogen receptor positive status [ER+]: Principal | ICD-10-CM

## 2019-01-28 DIAGNOSIS — Z79899 Other long term (current) drug therapy: Secondary | ICD-10-CM | POA: Diagnosis not present

## 2019-01-28 DIAGNOSIS — Z79890 Hormone replacement therapy (postmenopausal): Secondary | ICD-10-CM | POA: Diagnosis not present

## 2019-01-28 DIAGNOSIS — E785 Hyperlipidemia, unspecified: Secondary | ICD-10-CM | POA: Diagnosis not present

## 2019-01-28 DIAGNOSIS — Z510 Encounter for antineoplastic radiation therapy: Secondary | ICD-10-CM | POA: Diagnosis not present

## 2019-01-28 DIAGNOSIS — F419 Anxiety disorder, unspecified: Secondary | ICD-10-CM | POA: Diagnosis not present

## 2019-01-28 DIAGNOSIS — F988 Other specified behavioral and emotional disorders with onset usually occurring in childhood and adolescence: Secondary | ICD-10-CM | POA: Diagnosis not present

## 2019-01-28 DIAGNOSIS — G43909 Migraine, unspecified, not intractable, without status migrainosus: Secondary | ICD-10-CM | POA: Diagnosis not present

## 2019-01-28 DIAGNOSIS — E039 Hypothyroidism, unspecified: Secondary | ICD-10-CM | POA: Diagnosis not present

## 2019-01-28 DIAGNOSIS — F329 Major depressive disorder, single episode, unspecified: Secondary | ICD-10-CM | POA: Diagnosis not present

## 2019-01-28 DIAGNOSIS — I10 Essential (primary) hypertension: Secondary | ICD-10-CM | POA: Diagnosis not present

## 2019-01-28 NOTE — Progress Notes (Signed)
  Radiation Oncology         [phone] ________________________________  Name: Tiffany Montes MRN: [id number]  Date: 01/28/2019  DOB: Feb 07, 1956  Optical Surface Tracking Plan:  Since intensity modulated radiotherapy (IMRT) and 3D conformal radiation treatment methods are predicated on accurate and precise positioning for treatment, intrafraction motion monitoring is medically necessary to ensure accurate and safe treatment delivery.  The ability to quantify intrafraction motion without excessive ionizing radiation dose can only be performed with optical surface tracking. Accordingly, surface imaging offers the opportunity to obtain 3D measurements of patient position throughout IMRT and 3D treatments without excessive radiation exposure.  I am ordering optical surface tracking for this patient's upcoming course of radiotherapy. ________________________________  Kyung Rudd, MD 01/28/2019 3:36 PM    Reference:   Particia Jasper, et al. Surface imaging-based analysis of intrafraction motion for breast radiotherapy patients.Journal of [place], n. 6, nov. 2014. ISSN [id number].   Available at: <http://www.jacmp.org/index.php/jacmp/article/view/4957>.

## 2019-01-28 NOTE — Progress Notes (Signed)
  Radiation Oncology         [phone] ________________________________  Name: Tiffany Montes MRN: [id number]  Date: 01/28/2019  DOB: 06-27-1956   DIAGNOSIS:     ICD-10-CM   1. Malignant neoplasm of upper-inner quadrant of left breast in female, estrogen receptor positive ([place]) C50.212    Z17.0     SIMULATION AND TREATMENT PLANNING NOTE  The patient presented for simulation prior to beginning her course of radiation treatment for her diagnosis of left-sided breast cancer. The patient was placed in a supine position on a breast board. A customized vac-lock bag was constructed and this complex treatment device will be used on a daily basis during her treatment. In this fashion, a CT scan was obtained through the chest area and an isocenter was placed near the chest wall within the breast.  The patient will be planned to receive a course of radiation initially to a dose of 42.56 Gy. This will consist of a whole breast radiotherapy technique. To accomplish this, 2 customized blocks have been designed which will correspond to medial and lateral whole breast tangent fields. This treatment will be accomplished at 2.66 Gy per fraction. A forward planning technique will also be evaluated to determine if this approach improves the plan. It is anticipated that the patient will then receive a 8 Gy boost to the seroma cavity which has been contoured. This will be accomplished at 2 Gy per fraction.   This initial treatment will consist of a 3-D conformal technique. The seroma has been contoured as the primary target structure. Additionally, dose volume histograms of both this target as well as the lungs and heart will also be evaluated. Such an approach is necessary to ensure that the target area is adequately covered while the nearby critical  normal structures are adequately spared.  Plan:  The final anticipated total dose therefore will correspond to 50.56 Gy.   Special treatment procedure was  performed today due to the extra time and effort required by myself to plan and prepare this patient for deep inspiration breath hold technique.  I have determined cardiac sparing to be of benefit to this patient to prevent long term cardiac damage due to radiation of the heart.  Bellows were placed on the patient's abdomen. To facilitate cardiac sparing, the patient was coached by the radiation therapists on breath hold techniques and breathing practice was performed. Practice waveforms were obtained. The patient was then scanned while maintaining breath hold in the treatment position.  This image was then transferred over to the imaging specialist. The imaging specialist then created a fusion of the free breathing and breath hold scans using the chest wall as the stable structure. I personally reviewed the fusion in axial, coronal and sagittal image planes.  Excellent cardiac sparing was obtained.  I felt the patient is an appropriate candidate for breath hold and the patient will be treated as such.  The image fusion was then reviewed with the patient to reinforce the necessity of reproducible breath hold.     _______________________________   Jodelle Gross, MD, PhD

## 2019-01-29 DIAGNOSIS — I10 Essential (primary) hypertension: Secondary | ICD-10-CM | POA: Diagnosis not present

## 2019-01-29 DIAGNOSIS — Z79899 Other long term (current) drug therapy: Secondary | ICD-10-CM | POA: Diagnosis not present

## 2019-01-29 DIAGNOSIS — F419 Anxiety disorder, unspecified: Secondary | ICD-10-CM | POA: Diagnosis not present

## 2019-01-29 DIAGNOSIS — Z170 Estrogen receptor positive status [ER+]: Secondary | ICD-10-CM | POA: Diagnosis not present

## 2019-01-29 DIAGNOSIS — G43909 Migraine, unspecified, not intractable, without status migrainosus: Secondary | ICD-10-CM | POA: Diagnosis not present

## 2019-01-29 DIAGNOSIS — C50212 Malignant neoplasm of upper-inner quadrant of left female breast: Secondary | ICD-10-CM | POA: Diagnosis not present

## 2019-01-29 DIAGNOSIS — F988 Other specified behavioral and emotional disorders with onset usually occurring in childhood and adolescence: Secondary | ICD-10-CM | POA: Diagnosis not present

## 2019-01-29 DIAGNOSIS — F329 Major depressive disorder, single episode, unspecified: Secondary | ICD-10-CM | POA: Diagnosis not present

## 2019-01-29 DIAGNOSIS — E785 Hyperlipidemia, unspecified: Secondary | ICD-10-CM | POA: Diagnosis not present

## 2019-01-29 DIAGNOSIS — Z79890 Hormone replacement therapy (postmenopausal): Secondary | ICD-10-CM | POA: Diagnosis not present

## 2019-01-29 DIAGNOSIS — E039 Hypothyroidism, unspecified: Secondary | ICD-10-CM | POA: Diagnosis not present

## 2019-01-29 DIAGNOSIS — Z510 Encounter for antineoplastic radiation therapy: Secondary | ICD-10-CM | POA: Diagnosis not present

## 2019-02-02 ENCOUNTER — Other Ambulatory Visit: Payer: Self-pay | Admitting: Family Medicine

## 2019-02-02 ENCOUNTER — Encounter: Payer: Self-pay | Admitting: *Deleted

## 2019-02-04 ENCOUNTER — Ambulatory Visit
Admission: RE | Admit: 2019-02-04 | Discharge: 2019-02-04 | Disposition: A | Discharge status: Home / Self Care | Payer: BLUE CROSS/BLUE SHIELD | Source: Ambulatory Visit | Attending: Radiation Oncology | Admitting: Radiation Oncology

## 2019-02-04 ENCOUNTER — Other Ambulatory Visit: Payer: Self-pay

## 2019-02-04 ENCOUNTER — Other Ambulatory Visit: Payer: Self-pay | Admitting: Family Medicine

## 2019-02-04 DIAGNOSIS — G43909 Migraine, unspecified, not intractable, without status migrainosus: Secondary | ICD-10-CM | POA: Diagnosis not present

## 2019-02-04 DIAGNOSIS — I10 Essential (primary) hypertension: Secondary | ICD-10-CM | POA: Diagnosis not present

## 2019-02-04 DIAGNOSIS — E785 Hyperlipidemia, unspecified: Secondary | ICD-10-CM | POA: Diagnosis not present

## 2019-02-04 DIAGNOSIS — C50212 Malignant neoplasm of upper-inner quadrant of left female breast: Secondary | ICD-10-CM | POA: Diagnosis not present

## 2019-02-04 DIAGNOSIS — F329 Major depressive disorder, single episode, unspecified: Secondary | ICD-10-CM | POA: Diagnosis not present

## 2019-02-04 DIAGNOSIS — F988 Other specified behavioral and emotional disorders with onset usually occurring in childhood and adolescence: Secondary | ICD-10-CM | POA: Diagnosis not present

## 2019-02-04 DIAGNOSIS — Z170 Estrogen receptor positive status [ER+]: Secondary | ICD-10-CM | POA: Diagnosis not present

## 2019-02-04 DIAGNOSIS — F419 Anxiety disorder, unspecified: Secondary | ICD-10-CM | POA: Diagnosis not present

## 2019-02-04 DIAGNOSIS — E039 Hypothyroidism, unspecified: Secondary | ICD-10-CM | POA: Diagnosis not present

## 2019-02-04 DIAGNOSIS — Z79899 Other long term (current) drug therapy: Secondary | ICD-10-CM | POA: Diagnosis not present

## 2019-02-04 DIAGNOSIS — Z79890 Hormone replacement therapy (postmenopausal): Secondary | ICD-10-CM | POA: Diagnosis not present

## 2019-02-04 DIAGNOSIS — Z510 Encounter for antineoplastic radiation therapy: Secondary | ICD-10-CM | POA: Diagnosis not present

## 2019-02-05 ENCOUNTER — Ambulatory Visit
Admission: RE | Admit: 2019-02-05 | Discharge: 2019-02-05 | Disposition: A | Discharge status: Home / Self Care | Payer: BLUE CROSS/BLUE SHIELD | Source: Ambulatory Visit | Attending: Radiation Oncology | Admitting: Radiation Oncology

## 2019-02-05 ENCOUNTER — Other Ambulatory Visit: Payer: Self-pay

## 2019-02-05 DIAGNOSIS — E785 Hyperlipidemia, unspecified: Secondary | ICD-10-CM | POA: Diagnosis not present

## 2019-02-05 DIAGNOSIS — Z510 Encounter for antineoplastic radiation therapy: Secondary | ICD-10-CM | POA: Diagnosis not present

## 2019-02-05 DIAGNOSIS — Z170 Estrogen receptor positive status [ER+]: Secondary | ICD-10-CM | POA: Insufficient documentation

## 2019-02-05 DIAGNOSIS — Z79899 Other long term (current) drug therapy: Secondary | ICD-10-CM | POA: Diagnosis not present

## 2019-02-05 DIAGNOSIS — Z79890 Hormone replacement therapy (postmenopausal): Secondary | ICD-10-CM | POA: Diagnosis not present

## 2019-02-05 DIAGNOSIS — I10 Essential (primary) hypertension: Secondary | ICD-10-CM | POA: Insufficient documentation

## 2019-02-05 DIAGNOSIS — F329 Major depressive disorder, single episode, unspecified: Secondary | ICD-10-CM | POA: Diagnosis not present

## 2019-02-05 DIAGNOSIS — F988 Other specified behavioral and emotional disorders with onset usually occurring in childhood and adolescence: Secondary | ICD-10-CM | POA: Diagnosis not present

## 2019-02-05 DIAGNOSIS — C50212 Malignant neoplasm of upper-inner quadrant of left female breast: Secondary | ICD-10-CM | POA: Insufficient documentation

## 2019-02-05 DIAGNOSIS — E039 Hypothyroidism, unspecified: Secondary | ICD-10-CM | POA: Insufficient documentation

## 2019-02-05 DIAGNOSIS — F419 Anxiety disorder, unspecified: Secondary | ICD-10-CM | POA: Diagnosis not present

## 2019-02-05 DIAGNOSIS — G43909 Migraine, unspecified, not intractable, without status migrainosus: Secondary | ICD-10-CM | POA: Insufficient documentation

## 2019-02-05 MED ORDER — RADIAPLEXRX EX GEL
Freq: Once | CUTANEOUS | Status: AC
  Administered 2019-02-05: 11:00:00 via TOPICAL

## 2019-02-05 MED ORDER — ALRA NON-METALLIC DEODORANT (RAD-ONC)
1.0000 "application " | Freq: Once | TOPICAL | Status: AC
  Administered 2019-02-05: 1 via TOPICAL

## 2019-02-05 NOTE — Telephone Encounter (Signed)
Or set up Doxy?

## 2019-02-05 NOTE — Telephone Encounter (Signed)
Refill once 

## 2019-02-05 NOTE — Telephone Encounter (Signed)
OK to fill this for patient?

## 2019-02-05 NOTE — Progress Notes (Signed)
Patient education complete given a booklet Radiation therapy and You Tiffany Montes's card given to the patient. She was given radioplex and deoderant with instructions. Patient was able to verbalize instructions. Questions and concerns addressed

## 2019-02-08 ENCOUNTER — Ambulatory Visit (HOSPITAL_COMMUNITY)
Admission: RE | Admit: 2019-02-08 | Discharge: 2019-02-08 | Disposition: A | Discharge status: Home / Self Care | Payer: BLUE CROSS/BLUE SHIELD | Source: Ambulatory Visit | Attending: Adult Health | Admitting: Adult Health

## 2019-02-08 ENCOUNTER — Other Ambulatory Visit: Payer: Self-pay

## 2019-02-08 ENCOUNTER — Other Ambulatory Visit (HOSPITAL_COMMUNITY): Payer: BLUE CROSS/BLUE SHIELD

## 2019-02-08 ENCOUNTER — Ambulatory Visit
Admission: RE | Admit: 2019-02-08 | Discharge: 2019-02-08 | Disposition: A | Discharge status: Home / Self Care | Payer: BLUE CROSS/BLUE SHIELD | Source: Ambulatory Visit | Attending: Radiation Oncology | Admitting: Radiation Oncology

## 2019-02-08 DIAGNOSIS — E039 Hypothyroidism, unspecified: Secondary | ICD-10-CM | POA: Diagnosis not present

## 2019-02-08 DIAGNOSIS — F329 Major depressive disorder, single episode, unspecified: Secondary | ICD-10-CM | POA: Diagnosis not present

## 2019-02-08 DIAGNOSIS — I10 Essential (primary) hypertension: Secondary | ICD-10-CM | POA: Insufficient documentation

## 2019-02-08 DIAGNOSIS — Z170 Estrogen receptor positive status [ER+]: Secondary | ICD-10-CM | POA: Diagnosis not present

## 2019-02-08 DIAGNOSIS — F419 Anxiety disorder, unspecified: Secondary | ICD-10-CM | POA: Diagnosis not present

## 2019-02-08 DIAGNOSIS — F988 Other specified behavioral and emotional disorders with onset usually occurring in childhood and adolescence: Secondary | ICD-10-CM | POA: Diagnosis not present

## 2019-02-08 DIAGNOSIS — C50212 Malignant neoplasm of upper-inner quadrant of left female breast: Secondary | ICD-10-CM

## 2019-02-08 DIAGNOSIS — Z510 Encounter for antineoplastic radiation therapy: Secondary | ICD-10-CM | POA: Diagnosis not present

## 2019-02-08 DIAGNOSIS — Z79899 Other long term (current) drug therapy: Secondary | ICD-10-CM | POA: Diagnosis not present

## 2019-02-08 DIAGNOSIS — E785 Hyperlipidemia, unspecified: Secondary | ICD-10-CM | POA: Diagnosis not present

## 2019-02-08 DIAGNOSIS — G43909 Migraine, unspecified, not intractable, without status migrainosus: Secondary | ICD-10-CM | POA: Diagnosis not present

## 2019-02-08 DIAGNOSIS — Z79890 Hormone replacement therapy (postmenopausal): Secondary | ICD-10-CM | POA: Diagnosis not present

## 2019-02-08 NOTE — Progress Notes (Signed)
  Echocardiogram 2D Echocardiogram has been performed.  Tiffany Montes R 02/08/2019, 11:08 AM

## 2019-02-09 ENCOUNTER — Other Ambulatory Visit: Payer: Self-pay

## 2019-02-09 ENCOUNTER — Ambulatory Visit
Admission: RE | Admit: 2019-02-09 | Discharge: 2019-02-09 | Disposition: A | Discharge status: Home / Self Care | Payer: BLUE CROSS/BLUE SHIELD | Source: Ambulatory Visit | Attending: Radiation Oncology | Admitting: Radiation Oncology

## 2019-02-09 DIAGNOSIS — Z79899 Other long term (current) drug therapy: Secondary | ICD-10-CM | POA: Diagnosis not present

## 2019-02-09 DIAGNOSIS — F419 Anxiety disorder, unspecified: Secondary | ICD-10-CM | POA: Diagnosis not present

## 2019-02-09 DIAGNOSIS — Z170 Estrogen receptor positive status [ER+]: Secondary | ICD-10-CM | POA: Diagnosis not present

## 2019-02-09 DIAGNOSIS — C50212 Malignant neoplasm of upper-inner quadrant of left female breast: Secondary | ICD-10-CM | POA: Diagnosis not present

## 2019-02-09 DIAGNOSIS — Z79890 Hormone replacement therapy (postmenopausal): Secondary | ICD-10-CM | POA: Diagnosis not present

## 2019-02-09 DIAGNOSIS — E039 Hypothyroidism, unspecified: Secondary | ICD-10-CM | POA: Diagnosis not present

## 2019-02-09 DIAGNOSIS — F329 Major depressive disorder, single episode, unspecified: Secondary | ICD-10-CM | POA: Diagnosis not present

## 2019-02-09 DIAGNOSIS — E785 Hyperlipidemia, unspecified: Secondary | ICD-10-CM | POA: Diagnosis not present

## 2019-02-09 DIAGNOSIS — F988 Other specified behavioral and emotional disorders with onset usually occurring in childhood and adolescence: Secondary | ICD-10-CM | POA: Diagnosis not present

## 2019-02-09 DIAGNOSIS — I10 Essential (primary) hypertension: Secondary | ICD-10-CM | POA: Diagnosis not present

## 2019-02-09 DIAGNOSIS — Z510 Encounter for antineoplastic radiation therapy: Secondary | ICD-10-CM | POA: Diagnosis not present

## 2019-02-09 DIAGNOSIS — G43909 Migraine, unspecified, not intractable, without status migrainosus: Secondary | ICD-10-CM | POA: Diagnosis not present
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
## 2019-02-10 ENCOUNTER — Ambulatory Visit
Admission: RE | Admit: 2019-02-10 | Discharge: 2019-02-10 | Disposition: A | Discharge status: Home / Self Care | Payer: BLUE CROSS/BLUE SHIELD | Source: Ambulatory Visit | Attending: Radiation Oncology | Admitting: Radiation Oncology

## 2019-02-10 ENCOUNTER — Other Ambulatory Visit: Payer: Self-pay

## 2019-02-10 DIAGNOSIS — F329 Major depressive disorder, single episode, unspecified: Secondary | ICD-10-CM | POA: Diagnosis not present

## 2019-02-10 DIAGNOSIS — F988 Other specified behavioral and emotional disorders with onset usually occurring in childhood and adolescence: Secondary | ICD-10-CM | POA: Diagnosis not present

## 2019-02-10 DIAGNOSIS — C50212 Malignant neoplasm of upper-inner quadrant of left female breast: Secondary | ICD-10-CM | POA: Diagnosis not present

## 2019-02-10 DIAGNOSIS — Z79890 Hormone replacement therapy (postmenopausal): Secondary | ICD-10-CM | POA: Diagnosis not present

## 2019-02-10 DIAGNOSIS — F419 Anxiety disorder, unspecified: Secondary | ICD-10-CM | POA: Diagnosis not present

## 2019-02-10 DIAGNOSIS — Z79899 Other long term (current) drug therapy: Secondary | ICD-10-CM | POA: Diagnosis not present

## 2019-02-10 DIAGNOSIS — Z170 Estrogen receptor positive status [ER+]: Secondary | ICD-10-CM | POA: Diagnosis not present

## 2019-02-10 DIAGNOSIS — G43909 Migraine, unspecified, not intractable, without status migrainosus: Secondary | ICD-10-CM | POA: Diagnosis not present

## 2019-02-10 DIAGNOSIS — E039 Hypothyroidism, unspecified: Secondary | ICD-10-CM | POA: Diagnosis not present

## 2019-02-10 DIAGNOSIS — I10 Essential (primary) hypertension: Secondary | ICD-10-CM | POA: Diagnosis not present

## 2019-02-10 DIAGNOSIS — Z510 Encounter for antineoplastic radiation therapy: Secondary | ICD-10-CM | POA: Diagnosis not present

## 2019-02-10 DIAGNOSIS — E785 Hyperlipidemia, unspecified: Secondary | ICD-10-CM | POA: Diagnosis not present

## 2019-02-11 ENCOUNTER — Inpatient Hospital Stay (HOSPITAL_BASED_OUTPATIENT_CLINIC_OR_DEPARTMENT_OTHER): Payer: BLUE CROSS/BLUE SHIELD | Admitting: Oncology

## 2019-02-11 ENCOUNTER — Ambulatory Visit
Admission: RE | Admit: 2019-02-11 | Discharge: 2019-02-11 | Disposition: A | Discharge status: Home / Self Care | Payer: BLUE CROSS/BLUE SHIELD | Source: Ambulatory Visit | Attending: Radiation Oncology | Admitting: Radiation Oncology

## 2019-02-11 ENCOUNTER — Other Ambulatory Visit: Payer: Self-pay | Admitting: Family Medicine

## 2019-02-11 ENCOUNTER — Other Ambulatory Visit: Payer: Self-pay

## 2019-02-11 VITALS — BP 127/63 | HR 78 | Temp 97.5°F | Resp 18 | Ht 62.0 in | Wt 217.7 lb

## 2019-02-11 DIAGNOSIS — F329 Major depressive disorder, single episode, unspecified: Secondary | ICD-10-CM

## 2019-02-11 DIAGNOSIS — E039 Hypothyroidism, unspecified: Secondary | ICD-10-CM | POA: Insufficient documentation

## 2019-02-11 DIAGNOSIS — E785 Hyperlipidemia, unspecified: Secondary | ICD-10-CM | POA: Insufficient documentation

## 2019-02-11 DIAGNOSIS — Z79899 Other long term (current) drug therapy: Secondary | ICD-10-CM

## 2019-02-11 DIAGNOSIS — Z79890 Hormone replacement therapy (postmenopausal): Secondary | ICD-10-CM | POA: Diagnosis not present

## 2019-02-11 DIAGNOSIS — G43909 Migraine, unspecified, not intractable, without status migrainosus: Secondary | ICD-10-CM | POA: Diagnosis not present

## 2019-02-11 DIAGNOSIS — Z170 Estrogen receptor positive status [ER+]: Secondary | ICD-10-CM | POA: Insufficient documentation

## 2019-02-11 DIAGNOSIS — M549 Dorsalgia, unspecified: Secondary | ICD-10-CM

## 2019-02-11 DIAGNOSIS — Z79811 Long term (current) use of aromatase inhibitors: Secondary | ICD-10-CM | POA: Insufficient documentation

## 2019-02-11 DIAGNOSIS — Z5112 Encounter for antineoplastic immunotherapy: Secondary | ICD-10-CM | POA: Insufficient documentation

## 2019-02-11 DIAGNOSIS — Z801 Family history of malignant neoplasm of trachea, bronchus and lung: Secondary | ICD-10-CM | POA: Insufficient documentation

## 2019-02-11 DIAGNOSIS — I10 Essential (primary) hypertension: Secondary | ICD-10-CM | POA: Insufficient documentation

## 2019-02-11 DIAGNOSIS — Z510 Encounter for antineoplastic radiation therapy: Secondary | ICD-10-CM | POA: Diagnosis not present

## 2019-02-11 DIAGNOSIS — C50312 Malignant neoplasm of lower-inner quadrant of left female breast: Secondary | ICD-10-CM | POA: Insufficient documentation

## 2019-02-11 DIAGNOSIS — F419 Anxiety disorder, unspecified: Secondary | ICD-10-CM | POA: Diagnosis not present

## 2019-02-11 DIAGNOSIS — Z6841 Body Mass Index (BMI) 40.0 and over, adult: Secondary | ICD-10-CM

## 2019-02-11 DIAGNOSIS — Z923 Personal history of irradiation: Secondary | ICD-10-CM | POA: Insufficient documentation

## 2019-02-11 DIAGNOSIS — C50212 Malignant neoplasm of upper-inner quadrant of left female breast: Secondary | ICD-10-CM

## 2019-02-11 DIAGNOSIS — F988 Other specified behavioral and emotional disorders with onset usually occurring in childhood and adolescence: Secondary | ICD-10-CM | POA: Diagnosis not present

## 2019-02-11 MED ORDER — ANASTROZOLE 1 MG PO TABS: 1.0000 mg | ORAL_TABLET | Freq: Every day | ORAL | 4 refills | Status: DC

## 2019-02-12 ENCOUNTER — Encounter: Payer: Self-pay | Admitting: *Deleted

## 2019-02-12 ENCOUNTER — Ambulatory Visit
Admission: RE | Admit: 2019-02-12 | Discharge: 2019-02-12 | Disposition: A | Discharge status: Home / Self Care | Payer: BLUE CROSS/BLUE SHIELD | Source: Ambulatory Visit | Attending: Radiation Oncology | Admitting: Radiation Oncology

## 2019-02-12 ENCOUNTER — Other Ambulatory Visit: Payer: Self-pay

## 2019-02-12 ENCOUNTER — Inpatient Hospital Stay: Payer: BLUE CROSS/BLUE SHIELD

## 2019-02-12 VITALS — BP 145/96 | HR 72 | Temp 97.8°F | Resp 18

## 2019-02-12 DIAGNOSIS — C50212 Malignant neoplasm of upper-inner quadrant of left female breast: Secondary | ICD-10-CM | POA: Diagnosis not present

## 2019-02-12 DIAGNOSIS — Z170 Estrogen receptor positive status [ER+]: Secondary | ICD-10-CM

## 2019-02-12 DIAGNOSIS — Z79899 Other long term (current) drug therapy: Secondary | ICD-10-CM | POA: Diagnosis not present

## 2019-02-12 DIAGNOSIS — Z79890 Hormone replacement therapy (postmenopausal): Secondary | ICD-10-CM | POA: Diagnosis not present

## 2019-02-12 DIAGNOSIS — G43909 Migraine, unspecified, not intractable, without status migrainosus: Secondary | ICD-10-CM | POA: Diagnosis not present

## 2019-02-12 DIAGNOSIS — I10 Essential (primary) hypertension: Secondary | ICD-10-CM | POA: Diagnosis not present

## 2019-02-12 DIAGNOSIS — E039 Hypothyroidism, unspecified: Secondary | ICD-10-CM | POA: Diagnosis not present

## 2019-02-12 DIAGNOSIS — F329 Major depressive disorder, single episode, unspecified: Secondary | ICD-10-CM | POA: Diagnosis not present

## 2019-02-12 DIAGNOSIS — E785 Hyperlipidemia, unspecified: Secondary | ICD-10-CM | POA: Diagnosis not present

## 2019-02-12 DIAGNOSIS — F419 Anxiety disorder, unspecified: Secondary | ICD-10-CM | POA: Diagnosis not present

## 2019-02-12 DIAGNOSIS — Z510 Encounter for antineoplastic radiation therapy: Secondary | ICD-10-CM | POA: Diagnosis not present

## 2019-02-12 DIAGNOSIS — F988 Other specified behavioral and emotional disorders with onset usually occurring in childhood and adolescence: Secondary | ICD-10-CM | POA: Diagnosis not present

## 2019-02-12 LAB — COMPREHENSIVE METABOLIC PANEL
ALT: 24 U/L (ref 0–44)
AST: 21 U/L (ref 15–41)
Albumin: 3.8 g/dL (ref 3.5–5.0)
Alkaline Phosphatase: 75 U/L (ref 38–126)
Anion gap: 9 (ref 5–15)
BUN: 16 mg/dL (ref 8–23)
CO2: 24 mmol/L (ref 22–32)
Calcium: 9.2 mg/dL (ref 8.9–10.3)
Chloride: 108 mmol/L (ref 98–111)
Creatinine, Ser: 0.82 mg/dL (ref 0.44–1.00)
GFR calc Af Amer: >60 mL/min (ref >60)
GFR calc non Af Amer: >60 mL/min (ref >60)
Glucose, Bld: 108 mg/dL — ABNORMAL HIGH (ref 70–99)
Potassium: 4 mmol/L (ref 3.5–5.1)
Sodium: 141 mmol/L (ref 135–145)
Total Bilirubin: 0.2 mg/dL — ABNORMAL LOW (ref 0.3–1.2)
Total Protein: 6.7 g/dL (ref 6.5–8.1)

## 2019-02-12 LAB — CBC WITH DIFFERENTIAL/PLATELET
Abs Immature Granulocytes: 0.01 10*3/uL (ref 0.00–0.07)
Basophils Absolute: 0.1 10*3/uL (ref 0.0–0.1)
Basophils Relative: 1 %
Eosinophils Absolute: 0.5 10*3/uL (ref 0.0–0.5)
Eosinophils Relative: 10 %
HCT: 37.9 % (ref 36.0–46.0)
Hemoglobin: 12.1 g/dL (ref 12.0–15.0)
Immature Granulocytes: 0 %
Lymphocytes Relative: 24 %
Lymphs Abs: 1.3 10*3/uL (ref 0.7–4.0)
MCH: 28.7 pg (ref 26.0–34.0)
MCHC: 31.9 g/dL (ref 30.0–36.0)
MCV: 90 fL (ref 80.0–100.0)
Monocytes Absolute: 0.5 10*3/uL (ref 0.1–1.0)
Monocytes Relative: 9 %
Neutro Abs: 2.9 10*3/uL (ref 1.7–7.7)
Neutrophils Relative %: 56 %
Platelets: 252 10*3/uL (ref 150–400)
RBC: 4.21 MIL/uL (ref 3.87–5.11)
RDW: 13.6 % (ref 11.5–15.5)
WBC: 5.3 10*3/uL (ref 4.0–10.5)
nRBC: 0 % (ref 0.0–0.2)

## 2019-02-12 MED ORDER — SODIUM CHLORIDE 0.9 % IV SOLN
Freq: Once | INTRAVENOUS | Status: AC
  Administered 2019-02-12: 10:00:00 via INTRAVENOUS
  Filled 2019-02-12: qty 250

## 2019-02-12 MED ORDER — HEPARIN SOD (PORK) LOCK FLUSH 100 UNIT/ML IV SOLN
500.0000 [IU] | Freq: Once | INTRAVENOUS | Status: AC | PRN
  Administered 2019-02-12: 12:00:00 500 [IU]
  Filled 2019-02-12: qty 5

## 2019-02-12 MED ORDER — ACETAMINOPHEN 325 MG PO TABS
ORAL_TABLET | ORAL | Status: AC
  Filled 2019-02-12: qty 2

## 2019-02-12 MED ORDER — ACETAMINOPHEN 325 MG PO TABS
650.0000 mg | ORAL_TABLET | Freq: Once | ORAL | Status: AC
  Administered 2019-02-12: 650 mg via ORAL

## 2019-02-12 MED ORDER — TRASTUZUMAB CHEMO 150 MG IV SOLR
600.0000 mg | Freq: Once | INTRAVENOUS | Status: AC
  Administered 2019-02-12: 600 mg via INTRAVENOUS
  Filled 2019-02-12: qty 28.57

## 2019-02-12 MED ORDER — SODIUM CHLORIDE 0.9% FLUSH
10.0000 mL | INTRAVENOUS | Status: DC | PRN
  Administered 2019-02-12: 12:00:00 10 mL
  Filled 2019-02-12: qty 10

## 2019-02-12 MED ORDER — DIPHENHYDRAMINE HCL 25 MG PO CAPS
ORAL_CAPSULE | ORAL | Status: AC
  Filled 2019-02-12: qty 1

## 2019-02-12 MED ORDER — DIPHENHYDRAMINE HCL 25 MG PO CAPS
25.0000 mg | ORAL_CAPSULE | Freq: Once | ORAL | Status: AC
  Administered 2019-02-12: 25 mg via ORAL

## 2019-02-12 NOTE — Patient Instructions (Signed)
[facility] Discharge Instructions for Patients Receiving Chemotherapy  Today you received the following chemotherapy agents:  trastuzumab (Herceptin)  To help prevent nausea and vomiting after your treatment, we encourage you to take your nausea medication as prescribed.   If you develop nausea and vomiting that is not controlled by your nausea medication, call the clinic.   BELOW ARE SYMPTOMS THAT SHOULD BE REPORTED IMMEDIATELY:  *FEVER GREATER THAN 100.5 F  *CHILLS WITH OR WITHOUT FEVER  NAUSEA AND VOMITING THAT IS NOT CONTROLLED WITH YOUR NAUSEA MEDICATION  *UNUSUAL SHORTNESS OF BREATH  *UNUSUAL BRUISING OR BLEEDING  TENDERNESS IN MOUTH AND THROAT WITH OR WITHOUT PRESENCE OF ULCERS  *URINARY PROBLEMS  *BOWEL PROBLEMS  UNUSUAL RASH Items with * indicate a potential emergency and should be followed up as soon as possible.  Feel free to call the clinic should you have any questions or concerns. The clinic phone number is [phone].  Please show the [facility] at check-in to the Emergency Department and triage nurse.   Coronavirus (COVID-19) Are you at risk?  Are you at risk for the Coronavirus (COVID-19)?  To be considered HIGH RISK for Coronavirus (COVID-19), you have to meet the following criteria:  . Traveled to [place], [place], [place], [place] or [place]; or in the [place] to [place], [place], [place], or [place]; and have fever, cough, and shortness of breath within the last 2 weeks of travel OR . Been in close contact with a person diagnosed with COVID-19 within the last 2 weeks and have fever, cough, and shortness of breath . IF YOU DO NOT MEET THESE CRITERIA, YOU ARE CONSIDERED LOW RISK FOR COVID-19.  What to do if you are HIGH RISK for COVID-19?  Marland Kitchen If you are having a medical emergency, call 911. . Seek medical care right away. Before you go to a doctor's office, urgent care or emergency department, call ahead and tell  them about your recent travel, contact with someone diagnosed with COVID-19, and your symptoms. You should receive instructions from your physician's office regarding next steps of care.  . When you arrive at healthcare provider, tell the healthcare staff immediately you have returned from visiting [place], [place], [place], [place] or [place]; or traveled in the [place] to [place], [place], [place], or [place]; in the last two weeks or you have been in close contact with a person diagnosed with COVID-19 in the last 2 weeks.   . Tell the health care staff about your symptoms: fever, cough and shortness of breath. . After you have been seen by a medical provider, you will be either: o Tested for (COVID-19) and discharged home on quarantine except to seek medical care if symptoms worsen, and asked to  - Stay home and avoid contact with others until you get your results (4-5 days)  - Avoid travel on public transportation if possible (such as bus, train, or airplane) or o Sent to the Emergency Department by EMS for evaluation, COVID-19 testing, and possible admission depending on your condition and test results.  What to do if you are LOW RISK for COVID-19?  Reduce your risk of any infection by using the same precautions used for avoiding the common cold or flu:  Marland Kitchen Wash your hands often with soap and warm water for at least 20 seconds.  If soap and water are not readily available, use an alcohol-based hand sanitizer with at least 60% alcohol.  . If  coughing or sneezing, cover your mouth and nose by coughing or sneezing into the elbow areas of your shirt or coat, into a tissue or into your sleeve (not your hands). . Avoid shaking hands with others and consider head nods or verbal greetings only. . Avoid touching your eyes, nose, or mouth with unwashed hands.  . Avoid close contact with people who are sick. . Avoid places or events with large numbers of people in one location, like concerts or  sporting events. . Carefully consider travel plans you have or are making. . If you are planning any travel outside or inside the [place], visit the CDC's Travelers' Health webpage for the latest health notices. . If you have some symptoms but not all symptoms, continue to monitor at home and seek medical attention if your symptoms worsen. . If you are having a medical emergency, call 911.   ADDITIONAL HEALTHCARE OPTIONS FOR PATIENTS  [facility] / e-Visit: [email or web address]         [facility]: [phone]  [facility]: [phone]                   [facility]: [phone]   

## 2019-02-15 ENCOUNTER — Ambulatory Visit
Admission: RE | Admit: 2019-02-15 | Discharge: 2019-02-15 | Disposition: A | Discharge status: Home / Self Care | Payer: BLUE CROSS/BLUE SHIELD | Source: Ambulatory Visit | Attending: Radiation Oncology | Admitting: Radiation Oncology

## 2019-02-15 ENCOUNTER — Telehealth: Payer: Self-pay | Admitting: Family Medicine

## 2019-02-15 ENCOUNTER — Other Ambulatory Visit: Payer: Self-pay

## 2019-02-15 DIAGNOSIS — F988 Other specified behavioral and emotional disorders with onset usually occurring in childhood and adolescence: Secondary | ICD-10-CM | POA: Diagnosis not present

## 2019-02-15 DIAGNOSIS — F329 Major depressive disorder, single episode, unspecified: Secondary | ICD-10-CM | POA: Diagnosis not present

## 2019-02-15 DIAGNOSIS — Z79890 Hormone replacement therapy (postmenopausal): Secondary | ICD-10-CM | POA: Diagnosis not present

## 2019-02-15 DIAGNOSIS — Z170 Estrogen receptor positive status [ER+]: Secondary | ICD-10-CM | POA: Diagnosis not present

## 2019-02-15 DIAGNOSIS — C50212 Malignant neoplasm of upper-inner quadrant of left female breast: Secondary | ICD-10-CM | POA: Diagnosis not present

## 2019-02-15 DIAGNOSIS — E785 Hyperlipidemia, unspecified: Secondary | ICD-10-CM | POA: Diagnosis not present

## 2019-02-15 DIAGNOSIS — Z79899 Other long term (current) drug therapy: Secondary | ICD-10-CM | POA: Diagnosis not present

## 2019-02-15 DIAGNOSIS — E039 Hypothyroidism, unspecified: Secondary | ICD-10-CM | POA: Diagnosis not present

## 2019-02-15 DIAGNOSIS — F419 Anxiety disorder, unspecified: Secondary | ICD-10-CM | POA: Diagnosis not present

## 2019-02-15 DIAGNOSIS — G43909 Migraine, unspecified, not intractable, without status migrainosus: Secondary | ICD-10-CM | POA: Diagnosis not present

## 2019-02-15 DIAGNOSIS — I10 Essential (primary) hypertension: Secondary | ICD-10-CM | POA: Diagnosis not present

## 2019-02-15 DIAGNOSIS — Z510 Encounter for antineoplastic radiation therapy: Secondary | ICD-10-CM | POA: Diagnosis not present

## 2019-02-15 MED ORDER — AMPHETAMINE-DEXTROAMPHET ER 30 MG PO CP24: ORAL_CAPSULE | ORAL | 0 refills | Status: DC

## 2019-02-15 MED ORDER — AMPHETAMINE-DEXTROAMPHET ER 30 MG PO CP24: 30.0000 mg | ORAL_CAPSULE | ORAL | 0 refills | Status: DC

## 2019-02-15 NOTE — Telephone Encounter (Signed)
Copied from [place] [id number]. Topic: Quick Communication - Rx Refill/Question >> Feb 15, 2019  1:45 PM [place], [place] D wrote: Medication: amphetamine-dextroamphetamine (ADDERALL XR) 30 MG 24 hr capsule  Has the patient contacted their pharmacy? Yes.   (Agent: If no, request that the patient contact the pharmacy for the refill.) (Agent: If yes, when and what did the pharmacy advise?)  Preferred Pharmacy (with phone number or street name): [id number] - [place], [place]. AT [phone] (Phone) [phone] (Fax)    Agent: Please be advised that RX refills may take up to 3 business days. We ask that you follow-up with your pharmacy.

## 2019-02-15 NOTE — Telephone Encounter (Signed)
Last OV 11/11/18, No future OV  Last filled 12/29/18, # 30 with 0 refills

## 2019-02-16 ENCOUNTER — Ambulatory Visit
Admission: RE | Admit: 2019-02-16 | Discharge: 2019-02-16 | Disposition: A | Discharge status: Home / Self Care | Payer: BLUE CROSS/BLUE SHIELD | Source: Ambulatory Visit | Attending: Radiation Oncology | Admitting: Radiation Oncology

## 2019-02-16 ENCOUNTER — Other Ambulatory Visit: Payer: Self-pay

## 2019-02-16 DIAGNOSIS — Z510 Encounter for antineoplastic radiation therapy: Secondary | ICD-10-CM | POA: Diagnosis not present

## 2019-02-16 DIAGNOSIS — F419 Anxiety disorder, unspecified: Secondary | ICD-10-CM | POA: Diagnosis not present

## 2019-02-16 DIAGNOSIS — C50212 Malignant neoplasm of upper-inner quadrant of left female breast: Secondary | ICD-10-CM | POA: Diagnosis not present

## 2019-02-16 DIAGNOSIS — Z79899 Other long term (current) drug therapy: Secondary | ICD-10-CM | POA: Diagnosis not present

## 2019-02-16 DIAGNOSIS — E039 Hypothyroidism, unspecified: Secondary | ICD-10-CM | POA: Diagnosis not present

## 2019-02-16 DIAGNOSIS — Z170 Estrogen receptor positive status [ER+]: Secondary | ICD-10-CM | POA: Diagnosis not present

## 2019-02-16 DIAGNOSIS — I10 Essential (primary) hypertension: Secondary | ICD-10-CM | POA: Diagnosis not present

## 2019-02-16 DIAGNOSIS — G43909 Migraine, unspecified, not intractable, without status migrainosus: Secondary | ICD-10-CM | POA: Diagnosis not present

## 2019-02-16 DIAGNOSIS — Z79890 Hormone replacement therapy (postmenopausal): Secondary | ICD-10-CM | POA: Diagnosis not present

## 2019-02-16 DIAGNOSIS — F988 Other specified behavioral and emotional disorders with onset usually occurring in childhood and adolescence: Secondary | ICD-10-CM | POA: Diagnosis not present

## 2019-02-16 DIAGNOSIS — E785 Hyperlipidemia, unspecified: Secondary | ICD-10-CM | POA: Diagnosis not present

## 2019-02-16 DIAGNOSIS — F329 Major depressive disorder, single episode, unspecified: Secondary | ICD-10-CM | POA: Diagnosis not present

## 2019-02-17 ENCOUNTER — Other Ambulatory Visit: Payer: Self-pay

## 2019-02-17 ENCOUNTER — Ambulatory Visit
Admission: RE | Admit: 2019-02-17 | Discharge: 2019-02-17 | Disposition: A | Discharge status: Home / Self Care | Payer: BLUE CROSS/BLUE SHIELD | Source: Ambulatory Visit | Attending: Radiation Oncology | Admitting: Radiation Oncology

## 2019-02-17 DIAGNOSIS — Z79899 Other long term (current) drug therapy: Secondary | ICD-10-CM | POA: Diagnosis not present

## 2019-02-17 DIAGNOSIS — Z170 Estrogen receptor positive status [ER+]: Secondary | ICD-10-CM | POA: Diagnosis not present

## 2019-02-17 DIAGNOSIS — Z510 Encounter for antineoplastic radiation therapy: Secondary | ICD-10-CM | POA: Diagnosis not present

## 2019-02-17 DIAGNOSIS — F419 Anxiety disorder, unspecified: Secondary | ICD-10-CM | POA: Diagnosis not present

## 2019-02-17 DIAGNOSIS — C50212 Malignant neoplasm of upper-inner quadrant of left female breast: Secondary | ICD-10-CM | POA: Diagnosis not present

## 2019-02-17 DIAGNOSIS — I10 Essential (primary) hypertension: Secondary | ICD-10-CM | POA: Diagnosis not present

## 2019-02-17 DIAGNOSIS — F329 Major depressive disorder, single episode, unspecified: Secondary | ICD-10-CM | POA: Diagnosis not present

## 2019-02-17 DIAGNOSIS — G43909 Migraine, unspecified, not intractable, without status migrainosus: Secondary | ICD-10-CM | POA: Diagnosis not present

## 2019-02-17 DIAGNOSIS — E039 Hypothyroidism, unspecified: Secondary | ICD-10-CM | POA: Diagnosis not present

## 2019-02-17 DIAGNOSIS — E785 Hyperlipidemia, unspecified: Secondary | ICD-10-CM | POA: Diagnosis not present

## 2019-02-17 DIAGNOSIS — F988 Other specified behavioral and emotional disorders with onset usually occurring in childhood and adolescence: Secondary | ICD-10-CM | POA: Diagnosis not present

## 2019-02-17 DIAGNOSIS — Z79890 Hormone replacement therapy (postmenopausal): Secondary | ICD-10-CM | POA: Diagnosis not present

## 2019-02-18 ENCOUNTER — Other Ambulatory Visit: Payer: Self-pay

## 2019-02-18 ENCOUNTER — Ambulatory Visit
Admission: RE | Admit: 2019-02-18 | Discharge: 2019-02-18 | Disposition: A | Discharge status: Home / Self Care | Payer: BLUE CROSS/BLUE SHIELD | Source: Ambulatory Visit | Attending: Radiation Oncology | Admitting: Radiation Oncology

## 2019-02-18 ENCOUNTER — Encounter: Payer: Self-pay | Admitting: *Deleted

## 2019-02-18 DIAGNOSIS — F419 Anxiety disorder, unspecified: Secondary | ICD-10-CM | POA: Diagnosis not present

## 2019-02-18 DIAGNOSIS — F988 Other specified behavioral and emotional disorders with onset usually occurring in childhood and adolescence: Secondary | ICD-10-CM | POA: Diagnosis not present

## 2019-02-18 DIAGNOSIS — Z510 Encounter for antineoplastic radiation therapy: Secondary | ICD-10-CM | POA: Diagnosis not present

## 2019-02-18 DIAGNOSIS — E039 Hypothyroidism, unspecified: Secondary | ICD-10-CM | POA: Diagnosis not present

## 2019-02-18 DIAGNOSIS — I10 Essential (primary) hypertension: Secondary | ICD-10-CM | POA: Diagnosis not present

## 2019-02-18 DIAGNOSIS — C50212 Malignant neoplasm of upper-inner quadrant of left female breast: Secondary | ICD-10-CM | POA: Diagnosis not present

## 2019-02-18 DIAGNOSIS — Z79899 Other long term (current) drug therapy: Secondary | ICD-10-CM | POA: Diagnosis not present

## 2019-02-18 DIAGNOSIS — E785 Hyperlipidemia, unspecified: Secondary | ICD-10-CM | POA: Diagnosis not present

## 2019-02-18 DIAGNOSIS — Z170 Estrogen receptor positive status [ER+]: Secondary | ICD-10-CM | POA: Diagnosis not present

## 2019-02-18 DIAGNOSIS — Z79890 Hormone replacement therapy (postmenopausal): Secondary | ICD-10-CM | POA: Diagnosis not present

## 2019-02-18 DIAGNOSIS — F329 Major depressive disorder, single episode, unspecified: Secondary | ICD-10-CM | POA: Diagnosis not present

## 2019-02-18 DIAGNOSIS — G43909 Migraine, unspecified, not intractable, without status migrainosus: Secondary | ICD-10-CM | POA: Diagnosis not present

## 2019-02-19 ENCOUNTER — Ambulatory Visit
Admission: RE | Admit: 2019-02-19 | Discharge: 2019-02-19 | Disposition: A | Discharge status: Home / Self Care | Payer: BLUE CROSS/BLUE SHIELD | Source: Ambulatory Visit | Attending: Radiation Oncology | Admitting: Radiation Oncology

## 2019-02-19 ENCOUNTER — Other Ambulatory Visit: Payer: Self-pay

## 2019-02-19 DIAGNOSIS — F329 Major depressive disorder, single episode, unspecified: Secondary | ICD-10-CM | POA: Diagnosis not present

## 2019-02-19 DIAGNOSIS — I10 Essential (primary) hypertension: Secondary | ICD-10-CM | POA: Diagnosis not present

## 2019-02-19 DIAGNOSIS — Z170 Estrogen receptor positive status [ER+]: Secondary | ICD-10-CM | POA: Diagnosis not present

## 2019-02-19 DIAGNOSIS — Z79890 Hormone replacement therapy (postmenopausal): Secondary | ICD-10-CM | POA: Diagnosis not present

## 2019-02-19 DIAGNOSIS — E039 Hypothyroidism, unspecified: Secondary | ICD-10-CM | POA: Diagnosis not present

## 2019-02-19 DIAGNOSIS — F988 Other specified behavioral and emotional disorders with onset usually occurring in childhood and adolescence: Secondary | ICD-10-CM | POA: Diagnosis not present

## 2019-02-19 DIAGNOSIS — Z79899 Other long term (current) drug therapy: Secondary | ICD-10-CM | POA: Diagnosis not present

## 2019-02-19 DIAGNOSIS — G43909 Migraine, unspecified, not intractable, without status migrainosus: Secondary | ICD-10-CM | POA: Diagnosis not present

## 2019-02-19 DIAGNOSIS — F419 Anxiety disorder, unspecified: Secondary | ICD-10-CM | POA: Diagnosis not present

## 2019-02-19 DIAGNOSIS — C50212 Malignant neoplasm of upper-inner quadrant of left female breast: Secondary | ICD-10-CM | POA: Diagnosis not present

## 2019-02-19 DIAGNOSIS — Z510 Encounter for antineoplastic radiation therapy: Secondary | ICD-10-CM | POA: Diagnosis not present

## 2019-02-19 DIAGNOSIS — E785 Hyperlipidemia, unspecified: Secondary | ICD-10-CM | POA: Diagnosis not present

## 2019-02-22 ENCOUNTER — Other Ambulatory Visit: Payer: Self-pay

## 2019-02-22 ENCOUNTER — Ambulatory Visit
Admission: RE | Admit: 2019-02-22 | Discharge: 2019-02-22 | Disposition: A | Discharge status: Home / Self Care | Payer: BLUE CROSS/BLUE SHIELD | Source: Ambulatory Visit | Attending: Radiation Oncology | Admitting: Radiation Oncology

## 2019-02-22 DIAGNOSIS — G43909 Migraine, unspecified, not intractable, without status migrainosus: Secondary | ICD-10-CM | POA: Diagnosis not present

## 2019-02-22 DIAGNOSIS — Z510 Encounter for antineoplastic radiation therapy: Secondary | ICD-10-CM | POA: Diagnosis not present

## 2019-02-22 DIAGNOSIS — F329 Major depressive disorder, single episode, unspecified: Secondary | ICD-10-CM | POA: Diagnosis not present

## 2019-02-22 DIAGNOSIS — Z79899 Other long term (current) drug therapy: Secondary | ICD-10-CM | POA: Diagnosis not present

## 2019-02-22 DIAGNOSIS — E039 Hypothyroidism, unspecified: Secondary | ICD-10-CM | POA: Diagnosis not present

## 2019-02-22 DIAGNOSIS — Z79890 Hormone replacement therapy (postmenopausal): Secondary | ICD-10-CM | POA: Diagnosis not present

## 2019-02-22 DIAGNOSIS — F419 Anxiety disorder, unspecified: Secondary | ICD-10-CM | POA: Diagnosis not present

## 2019-02-22 DIAGNOSIS — Z170 Estrogen receptor positive status [ER+]: Secondary | ICD-10-CM | POA: Diagnosis not present

## 2019-02-22 DIAGNOSIS — F988 Other specified behavioral and emotional disorders with onset usually occurring in childhood and adolescence: Secondary | ICD-10-CM | POA: Diagnosis not present

## 2019-02-22 DIAGNOSIS — E785 Hyperlipidemia, unspecified: Secondary | ICD-10-CM | POA: Diagnosis not present

## 2019-02-22 DIAGNOSIS — I10 Essential (primary) hypertension: Secondary | ICD-10-CM | POA: Diagnosis not present

## 2019-02-22 DIAGNOSIS — C50212 Malignant neoplasm of upper-inner quadrant of left female breast: Secondary | ICD-10-CM | POA: Diagnosis not present

## 2019-02-23 ENCOUNTER — Ambulatory Visit
Admission: RE | Admit: 2019-02-23 | Discharge: 2019-02-23 | Disposition: A | Discharge status: Home / Self Care | Payer: BLUE CROSS/BLUE SHIELD | Source: Ambulatory Visit | Attending: Radiation Oncology | Admitting: Radiation Oncology

## 2019-02-23 ENCOUNTER — Other Ambulatory Visit: Payer: Self-pay

## 2019-02-23 DIAGNOSIS — G43909 Migraine, unspecified, not intractable, without status migrainosus: Secondary | ICD-10-CM | POA: Diagnosis not present

## 2019-02-23 DIAGNOSIS — C50212 Malignant neoplasm of upper-inner quadrant of left female breast: Secondary | ICD-10-CM | POA: Diagnosis not present

## 2019-02-23 DIAGNOSIS — F329 Major depressive disorder, single episode, unspecified: Secondary | ICD-10-CM | POA: Diagnosis not present

## 2019-02-23 DIAGNOSIS — E785 Hyperlipidemia, unspecified: Secondary | ICD-10-CM | POA: Diagnosis not present

## 2019-02-23 DIAGNOSIS — Z79899 Other long term (current) drug therapy: Secondary | ICD-10-CM | POA: Diagnosis not present

## 2019-02-23 DIAGNOSIS — I10 Essential (primary) hypertension: Secondary | ICD-10-CM | POA: Diagnosis not present

## 2019-02-23 DIAGNOSIS — F988 Other specified behavioral and emotional disorders with onset usually occurring in childhood and adolescence: Secondary | ICD-10-CM | POA: Diagnosis not present

## 2019-02-23 DIAGNOSIS — Z170 Estrogen receptor positive status [ER+]: Secondary | ICD-10-CM | POA: Diagnosis not present

## 2019-02-23 DIAGNOSIS — F419 Anxiety disorder, unspecified: Secondary | ICD-10-CM | POA: Diagnosis not present

## 2019-02-23 DIAGNOSIS — E039 Hypothyroidism, unspecified: Secondary | ICD-10-CM | POA: Diagnosis not present

## 2019-02-23 DIAGNOSIS — Z510 Encounter for antineoplastic radiation therapy: Secondary | ICD-10-CM | POA: Diagnosis not present

## 2019-02-23 DIAGNOSIS — Z79890 Hormone replacement therapy (postmenopausal): Secondary | ICD-10-CM | POA: Diagnosis not present

## 2019-02-24 ENCOUNTER — Ambulatory Visit
Admission: RE | Admit: 2019-02-24 | Discharge: 2019-02-24 | Disposition: A | Discharge status: Home / Self Care | Payer: BLUE CROSS/BLUE SHIELD | Source: Ambulatory Visit | Attending: Radiation Oncology | Admitting: Radiation Oncology

## 2019-02-24 ENCOUNTER — Other Ambulatory Visit: Payer: Self-pay

## 2019-02-24 DIAGNOSIS — C50212 Malignant neoplasm of upper-inner quadrant of left female breast: Secondary | ICD-10-CM | POA: Diagnosis not present

## 2019-02-24 DIAGNOSIS — E785 Hyperlipidemia, unspecified: Secondary | ICD-10-CM | POA: Diagnosis not present

## 2019-02-24 DIAGNOSIS — F419 Anxiety disorder, unspecified: Secondary | ICD-10-CM | POA: Diagnosis not present

## 2019-02-24 DIAGNOSIS — Z79890 Hormone replacement therapy (postmenopausal): Secondary | ICD-10-CM | POA: Diagnosis not present

## 2019-02-24 DIAGNOSIS — Z510 Encounter for antineoplastic radiation therapy: Secondary | ICD-10-CM | POA: Diagnosis not present

## 2019-02-24 DIAGNOSIS — Z79899 Other long term (current) drug therapy: Secondary | ICD-10-CM | POA: Diagnosis not present

## 2019-02-24 DIAGNOSIS — Z170 Estrogen receptor positive status [ER+]: Secondary | ICD-10-CM | POA: Diagnosis not present

## 2019-02-24 DIAGNOSIS — G43909 Migraine, unspecified, not intractable, without status migrainosus: Secondary | ICD-10-CM | POA: Diagnosis not present

## 2019-02-24 DIAGNOSIS — F988 Other specified behavioral and emotional disorders with onset usually occurring in childhood and adolescence: Secondary | ICD-10-CM | POA: Diagnosis not present

## 2019-02-24 DIAGNOSIS — E039 Hypothyroidism, unspecified: Secondary | ICD-10-CM | POA: Diagnosis not present

## 2019-02-24 DIAGNOSIS — F329 Major depressive disorder, single episode, unspecified: Secondary | ICD-10-CM | POA: Diagnosis not present

## 2019-02-24 DIAGNOSIS — I10 Essential (primary) hypertension: Secondary | ICD-10-CM | POA: Diagnosis not present

## 2019-02-25 ENCOUNTER — Other Ambulatory Visit: Payer: Self-pay

## 2019-02-25 ENCOUNTER — Ambulatory Visit
Admission: RE | Admit: 2019-02-25 | Discharge: 2019-02-25 | Disposition: A | Discharge status: Home / Self Care | Payer: BLUE CROSS/BLUE SHIELD | Source: Ambulatory Visit | Attending: Radiation Oncology | Admitting: Radiation Oncology

## 2019-02-25 DIAGNOSIS — F329 Major depressive disorder, single episode, unspecified: Secondary | ICD-10-CM | POA: Diagnosis not present

## 2019-02-25 DIAGNOSIS — Z79890 Hormone replacement therapy (postmenopausal): Secondary | ICD-10-CM | POA: Diagnosis not present

## 2019-02-25 DIAGNOSIS — Z510 Encounter for antineoplastic radiation therapy: Secondary | ICD-10-CM | POA: Diagnosis not present

## 2019-02-25 DIAGNOSIS — Z79899 Other long term (current) drug therapy: Secondary | ICD-10-CM | POA: Diagnosis not present

## 2019-02-25 DIAGNOSIS — F988 Other specified behavioral and emotional disorders with onset usually occurring in childhood and adolescence: Secondary | ICD-10-CM | POA: Diagnosis not present

## 2019-02-25 DIAGNOSIS — Z170 Estrogen receptor positive status [ER+]: Secondary | ICD-10-CM | POA: Diagnosis not present

## 2019-02-25 DIAGNOSIS — G43909 Migraine, unspecified, not intractable, without status migrainosus: Secondary | ICD-10-CM | POA: Diagnosis not present

## 2019-02-25 DIAGNOSIS — E039 Hypothyroidism, unspecified: Secondary | ICD-10-CM | POA: Diagnosis not present

## 2019-02-25 DIAGNOSIS — I10 Essential (primary) hypertension: Secondary | ICD-10-CM | POA: Diagnosis not present

## 2019-02-25 DIAGNOSIS — F419 Anxiety disorder, unspecified: Secondary | ICD-10-CM | POA: Diagnosis not present

## 2019-02-25 DIAGNOSIS — E785 Hyperlipidemia, unspecified: Secondary | ICD-10-CM | POA: Diagnosis not present

## 2019-02-25 DIAGNOSIS — C50212 Malignant neoplasm of upper-inner quadrant of left female breast: Secondary | ICD-10-CM | POA: Diagnosis not present

## 2019-02-26 ENCOUNTER — Ambulatory Visit
Admission: RE | Admit: 2019-02-26 | Discharge: 2019-02-26 | Disposition: A | Discharge status: Home / Self Care | Payer: BLUE CROSS/BLUE SHIELD | Source: Ambulatory Visit | Attending: Radiation Oncology | Admitting: Radiation Oncology

## 2019-02-26 ENCOUNTER — Other Ambulatory Visit: Payer: Self-pay

## 2019-02-26 DIAGNOSIS — G43909 Migraine, unspecified, not intractable, without status migrainosus: Secondary | ICD-10-CM | POA: Diagnosis not present

## 2019-02-26 DIAGNOSIS — I10 Essential (primary) hypertension: Secondary | ICD-10-CM | POA: Diagnosis not present

## 2019-02-26 DIAGNOSIS — Z510 Encounter for antineoplastic radiation therapy: Secondary | ICD-10-CM | POA: Diagnosis not present

## 2019-02-26 DIAGNOSIS — F419 Anxiety disorder, unspecified: Secondary | ICD-10-CM | POA: Diagnosis not present

## 2019-02-26 DIAGNOSIS — Z170 Estrogen receptor positive status [ER+]: Secondary | ICD-10-CM | POA: Diagnosis not present

## 2019-02-26 DIAGNOSIS — C50212 Malignant neoplasm of upper-inner quadrant of left female breast: Secondary | ICD-10-CM | POA: Diagnosis not present

## 2019-02-26 DIAGNOSIS — E039 Hypothyroidism, unspecified: Secondary | ICD-10-CM | POA: Diagnosis not present

## 2019-02-26 DIAGNOSIS — E785 Hyperlipidemia, unspecified: Secondary | ICD-10-CM | POA: Diagnosis not present

## 2019-02-26 DIAGNOSIS — Z79899 Other long term (current) drug therapy: Secondary | ICD-10-CM | POA: Diagnosis not present

## 2019-02-26 DIAGNOSIS — F329 Major depressive disorder, single episode, unspecified: Secondary | ICD-10-CM | POA: Diagnosis not present

## 2019-02-26 DIAGNOSIS — F988 Other specified behavioral and emotional disorders with onset usually occurring in childhood and adolescence: Secondary | ICD-10-CM | POA: Diagnosis not present

## 2019-02-26 DIAGNOSIS — Z79890 Hormone replacement therapy (postmenopausal): Secondary | ICD-10-CM | POA: Diagnosis not present

## 2019-03-02 ENCOUNTER — Encounter: Payer: Self-pay | Admitting: *Deleted

## 2019-03-02 ENCOUNTER — Ambulatory Visit
Admission: RE | Admit: 2019-03-02 | Discharge: 2019-03-02 | Disposition: A | Discharge status: Home / Self Care | Payer: BLUE CROSS/BLUE SHIELD | Source: Ambulatory Visit | Attending: Radiation Oncology | Admitting: Radiation Oncology

## 2019-03-02 ENCOUNTER — Other Ambulatory Visit: Payer: Self-pay

## 2019-03-02 DIAGNOSIS — F419 Anxiety disorder, unspecified: Secondary | ICD-10-CM | POA: Diagnosis not present

## 2019-03-02 DIAGNOSIS — Z79890 Hormone replacement therapy (postmenopausal): Secondary | ICD-10-CM | POA: Diagnosis not present

## 2019-03-02 DIAGNOSIS — E785 Hyperlipidemia, unspecified: Secondary | ICD-10-CM | POA: Diagnosis not present

## 2019-03-02 DIAGNOSIS — E039 Hypothyroidism, unspecified: Secondary | ICD-10-CM | POA: Diagnosis not present

## 2019-03-02 DIAGNOSIS — F988 Other specified behavioral and emotional disorders with onset usually occurring in childhood and adolescence: Secondary | ICD-10-CM | POA: Diagnosis not present

## 2019-03-02 DIAGNOSIS — Z510 Encounter for antineoplastic radiation therapy: Secondary | ICD-10-CM | POA: Diagnosis not present

## 2019-03-02 DIAGNOSIS — Z170 Estrogen receptor positive status [ER+]: Secondary | ICD-10-CM | POA: Diagnosis not present

## 2019-03-02 DIAGNOSIS — G43909 Migraine, unspecified, not intractable, without status migrainosus: Secondary | ICD-10-CM | POA: Diagnosis not present

## 2019-03-02 DIAGNOSIS — F329 Major depressive disorder, single episode, unspecified: Secondary | ICD-10-CM | POA: Diagnosis not present

## 2019-03-02 DIAGNOSIS — C50212 Malignant neoplasm of upper-inner quadrant of left female breast: Secondary | ICD-10-CM | POA: Diagnosis not present

## 2019-03-02 DIAGNOSIS — I10 Essential (primary) hypertension: Secondary | ICD-10-CM | POA: Diagnosis not present

## 2019-03-02 DIAGNOSIS — Z79899 Other long term (current) drug therapy: Secondary | ICD-10-CM | POA: Diagnosis not present

## 2019-03-03 ENCOUNTER — Ambulatory Visit
Admission: RE | Admit: 2019-03-03 | Discharge: 2019-03-03 | Disposition: A | Discharge status: Home / Self Care | Payer: BLUE CROSS/BLUE SHIELD | Source: Ambulatory Visit | Attending: Radiation Oncology | Admitting: Radiation Oncology

## 2019-03-03 ENCOUNTER — Other Ambulatory Visit: Payer: Self-pay

## 2019-03-03 DIAGNOSIS — Z79899 Other long term (current) drug therapy: Secondary | ICD-10-CM | POA: Diagnosis not present

## 2019-03-03 DIAGNOSIS — I10 Essential (primary) hypertension: Secondary | ICD-10-CM | POA: Diagnosis not present

## 2019-03-03 DIAGNOSIS — F329 Major depressive disorder, single episode, unspecified: Secondary | ICD-10-CM | POA: Diagnosis not present

## 2019-03-03 DIAGNOSIS — G43909 Migraine, unspecified, not intractable, without status migrainosus: Secondary | ICD-10-CM | POA: Diagnosis not present

## 2019-03-03 DIAGNOSIS — E785 Hyperlipidemia, unspecified: Secondary | ICD-10-CM | POA: Diagnosis not present

## 2019-03-03 DIAGNOSIS — Z79890 Hormone replacement therapy (postmenopausal): Secondary | ICD-10-CM | POA: Diagnosis not present

## 2019-03-03 DIAGNOSIS — Z510 Encounter for antineoplastic radiation therapy: Secondary | ICD-10-CM | POA: Diagnosis not present

## 2019-03-03 DIAGNOSIS — F988 Other specified behavioral and emotional disorders with onset usually occurring in childhood and adolescence: Secondary | ICD-10-CM | POA: Diagnosis not present

## 2019-03-03 DIAGNOSIS — C50212 Malignant neoplasm of upper-inner quadrant of left female breast: Secondary | ICD-10-CM | POA: Diagnosis not present

## 2019-03-03 DIAGNOSIS — Z170 Estrogen receptor positive status [ER+]: Secondary | ICD-10-CM | POA: Diagnosis not present

## 2019-03-03 DIAGNOSIS — F419 Anxiety disorder, unspecified: Secondary | ICD-10-CM | POA: Diagnosis not present

## 2019-03-03 DIAGNOSIS — E039 Hypothyroidism, unspecified: Secondary | ICD-10-CM | POA: Diagnosis not present

## 2019-03-04 ENCOUNTER — Ambulatory Visit
Admission: RE | Admit: 2019-03-04 | Discharge: 2019-03-04 | Disposition: A | Discharge status: Home / Self Care | Payer: BLUE CROSS/BLUE SHIELD | Source: Ambulatory Visit | Attending: Radiation Oncology | Admitting: Radiation Oncology

## 2019-03-04 ENCOUNTER — Other Ambulatory Visit: Payer: Self-pay

## 2019-03-04 ENCOUNTER — Encounter: Payer: Self-pay | Admitting: Radiation Oncology

## 2019-03-04 ENCOUNTER — Other Ambulatory Visit: Payer: Self-pay | Admitting: *Deleted

## 2019-03-04 DIAGNOSIS — I10 Essential (primary) hypertension: Secondary | ICD-10-CM | POA: Diagnosis not present

## 2019-03-04 DIAGNOSIS — Z510 Encounter for antineoplastic radiation therapy: Secondary | ICD-10-CM | POA: Diagnosis not present

## 2019-03-04 DIAGNOSIS — F988 Other specified behavioral and emotional disorders with onset usually occurring in childhood and adolescence: Secondary | ICD-10-CM | POA: Diagnosis not present

## 2019-03-04 DIAGNOSIS — Z170 Estrogen receptor positive status [ER+]: Secondary | ICD-10-CM | POA: Diagnosis not present

## 2019-03-04 DIAGNOSIS — E785 Hyperlipidemia, unspecified: Secondary | ICD-10-CM | POA: Diagnosis not present

## 2019-03-04 DIAGNOSIS — Z79890 Hormone replacement therapy (postmenopausal): Secondary | ICD-10-CM | POA: Diagnosis not present

## 2019-03-04 DIAGNOSIS — E039 Hypothyroidism, unspecified: Secondary | ICD-10-CM | POA: Diagnosis not present

## 2019-03-04 DIAGNOSIS — F419 Anxiety disorder, unspecified: Secondary | ICD-10-CM | POA: Diagnosis not present

## 2019-03-04 DIAGNOSIS — F329 Major depressive disorder, single episode, unspecified: Secondary | ICD-10-CM | POA: Diagnosis not present

## 2019-03-04 DIAGNOSIS — Z79899 Other long term (current) drug therapy: Secondary | ICD-10-CM | POA: Diagnosis not present

## 2019-03-04 DIAGNOSIS — C50212 Malignant neoplasm of upper-inner quadrant of left female breast: Secondary | ICD-10-CM | POA: Diagnosis not present

## 2019-03-04 DIAGNOSIS — G43909 Migraine, unspecified, not intractable, without status migrainosus: Secondary | ICD-10-CM | POA: Diagnosis not present

## 2019-03-05 ENCOUNTER — Telehealth: Payer: Self-pay

## 2019-03-05 ENCOUNTER — Other Ambulatory Visit: Payer: Self-pay

## 2019-03-05 ENCOUNTER — Inpatient Hospital Stay: Payer: BLUE CROSS/BLUE SHIELD

## 2019-03-05 ENCOUNTER — Encounter: Payer: Self-pay | Admitting: *Deleted

## 2019-03-05 VITALS — BP 153/84 | HR 79 | Temp 98.0°F | Resp 18 | Wt 215.5 lb

## 2019-03-05 DIAGNOSIS — Z510 Encounter for antineoplastic radiation therapy: Secondary | ICD-10-CM | POA: Diagnosis not present

## 2019-03-05 DIAGNOSIS — F419 Anxiety disorder, unspecified: Secondary | ICD-10-CM | POA: Diagnosis not present

## 2019-03-05 DIAGNOSIS — E785 Hyperlipidemia, unspecified: Secondary | ICD-10-CM | POA: Diagnosis not present

## 2019-03-05 DIAGNOSIS — C50212 Malignant neoplasm of upper-inner quadrant of left female breast: Secondary | ICD-10-CM

## 2019-03-05 DIAGNOSIS — Z170 Estrogen receptor positive status [ER+]: Secondary | ICD-10-CM | POA: Diagnosis not present

## 2019-03-05 DIAGNOSIS — Z95828 Presence of other vascular implants and grafts: Secondary | ICD-10-CM

## 2019-03-05 DIAGNOSIS — F988 Other specified behavioral and emotional disorders with onset usually occurring in childhood and adolescence: Secondary | ICD-10-CM | POA: Diagnosis not present

## 2019-03-05 DIAGNOSIS — Z79890 Hormone replacement therapy (postmenopausal): Secondary | ICD-10-CM | POA: Diagnosis not present

## 2019-03-05 DIAGNOSIS — G43909 Migraine, unspecified, not intractable, without status migrainosus: Secondary | ICD-10-CM | POA: Diagnosis not present

## 2019-03-05 DIAGNOSIS — F329 Major depressive disorder, single episode, unspecified: Secondary | ICD-10-CM | POA: Diagnosis not present

## 2019-03-05 DIAGNOSIS — E039 Hypothyroidism, unspecified: Secondary | ICD-10-CM | POA: Diagnosis not present

## 2019-03-05 DIAGNOSIS — Z79899 Other long term (current) drug therapy: Secondary | ICD-10-CM | POA: Diagnosis not present

## 2019-03-05 DIAGNOSIS — I10 Essential (primary) hypertension: Secondary | ICD-10-CM | POA: Diagnosis not present

## 2019-03-05 LAB — COMPREHENSIVE METABOLIC PANEL
ALT: 20 U/L (ref 0–44)
AST: 18 U/L (ref 15–41)
Albumin: 3.6 g/dL (ref 3.5–5.0)
Alkaline Phosphatase: 75 U/L (ref 38–126)
Anion gap: 11 (ref 5–15)
BUN: 24 mg/dL — ABNORMAL HIGH (ref 8–23)
CO2: 23 mmol/L (ref 22–32)
Calcium: 8.8 mg/dL — ABNORMAL LOW (ref 8.9–10.3)
Chloride: 108 mmol/L (ref 98–111)
Creatinine, Ser: 0.8 mg/dL (ref 0.44–1.00)
GFR calc Af Amer: >60 mL/min (ref >60)
GFR calc non Af Amer: >60 mL/min (ref >60)
Glucose, Bld: 136 mg/dL — ABNORMAL HIGH (ref 70–99)
Potassium: 3.6 mmol/L (ref 3.5–5.1)
Sodium: 142 mmol/L (ref 135–145)
Total Bilirubin: 0.2 mg/dL — ABNORMAL LOW (ref 0.3–1.2)
Total Protein: 6.4 g/dL — ABNORMAL LOW (ref 6.5–8.1)

## 2019-03-05 LAB — CBC WITH DIFFERENTIAL/PLATELET
Abs Immature Granulocytes: 0.02 10*3/uL (ref 0.00–0.07)
Basophils Absolute: 0 10*3/uL (ref 0.0–0.1)
Basophils Relative: 1 %
Eosinophils Absolute: 0.6 10*3/uL — ABNORMAL HIGH (ref 0.0–0.5)
Eosinophils Relative: 10 %
HCT: 39.1 % (ref 36.0–46.0)
Hemoglobin: 12.4 g/dL (ref 12.0–15.0)
Immature Granulocytes: 0 %
Lymphocytes Relative: 16 %
Lymphs Abs: 0.9 10*3/uL (ref 0.7–4.0)
MCH: 28.6 pg (ref 26.0–34.0)
MCHC: 31.7 g/dL (ref 30.0–36.0)
MCV: 90.1 fL (ref 80.0–100.0)
Monocytes Absolute: 0.5 10*3/uL (ref 0.1–1.0)
Monocytes Relative: 9 %
Neutro Abs: 3.8 10*3/uL (ref 1.7–7.7)
Neutrophils Relative %: 64 %
Platelets: 216 10*3/uL (ref 150–400)
RBC: 4.34 MIL/uL (ref 3.87–5.11)
RDW: 13.2 % (ref 11.5–15.5)
WBC: 5.9 10*3/uL (ref 4.0–10.5)
nRBC: 0 % (ref 0.0–0.2)

## 2019-03-05 MED ORDER — ACETAMINOPHEN 325 MG PO TABS
ORAL_TABLET | ORAL | Status: AC
  Filled 2019-03-05: qty 2

## 2019-03-05 MED ORDER — SODIUM CHLORIDE 0.9% FLUSH
10.0000 mL | INTRAVENOUS | Status: DC | PRN
  Administered 2019-03-05: 10 mL
  Filled 2019-03-05: qty 10

## 2019-03-05 MED ORDER — SODIUM CHLORIDE 0.9 % IV SOLN
Freq: Once | INTRAVENOUS | Status: AC
  Administered 2019-03-05: 09:00:00 via INTRAVENOUS
  Filled 2019-03-05: qty 250

## 2019-03-05 MED ORDER — SODIUM CHLORIDE 0.9% FLUSH
10.0000 mL | INTRAVENOUS | Status: DC | PRN
  Administered 2019-03-05: 11:00:00 10 mL
  Filled 2019-03-05: qty 10

## 2019-03-05 MED ORDER — ACETAMINOPHEN 325 MG PO TABS
650.0000 mg | ORAL_TABLET | Freq: Once | ORAL | Status: AC
  Administered 2019-03-05: 650 mg via ORAL

## 2019-03-05 MED ORDER — TRASTUZUMAB CHEMO 150 MG IV SOLR
600.0000 mg | Freq: Once | INTRAVENOUS | Status: AC
  Administered 2019-03-05: 600 mg via INTRAVENOUS
  Filled 2019-03-05: qty 28.57

## 2019-03-05 MED ORDER — ANASTROZOLE 1 MG PO TABS: 1.0000 mg | ORAL_TABLET | Freq: Every day | ORAL | 4 refills | Status: DC

## 2019-03-05 MED ORDER — DIPHENHYDRAMINE HCL 25 MG PO CAPS
ORAL_CAPSULE | ORAL | Status: AC
  Filled 2019-03-05: qty 1

## 2019-03-05 MED ORDER — HEPARIN SOD (PORK) LOCK FLUSH 100 UNIT/ML IV SOLN
500.0000 [IU] | Freq: Once | INTRAVENOUS | Status: AC | PRN
  Administered 2019-03-05: 500 [IU]
  Filled 2019-03-05: qty 5

## 2019-03-05 MED ORDER — DIPHENHYDRAMINE HCL 25 MG PO CAPS
25.0000 mg | ORAL_CAPSULE | Freq: Once | ORAL | Status: AC
  Administered 2019-03-05: 25 mg via ORAL

## 2019-03-05 NOTE — Telephone Encounter (Signed)
[facility].   Per chart medication was sent to pharmacy on 02/08/19 for 90 tablets and 4 refills. Pharmacy called to verify.  Pharmacy states they did not receive prescription. This RN sent new prescription electronically for 90 pills with 4 refills.

## 2019-03-05 NOTE — Patient Instructions (Signed)
[facility] Discharge Instructions for Patients Receiving Chemotherapy  Today you received the following chemotherapy agents Herceptin  To help prevent nausea and vomiting after your treatment, we encourage you to take your nausea medication as directed   If you develop nausea and vomiting that is not controlled by your nausea medication, call the clinic.   BELOW ARE SYMPTOMS THAT SHOULD BE REPORTED IMMEDIATELY:  *FEVER GREATER THAN 100.5 F  *CHILLS WITH OR WITHOUT FEVER  NAUSEA AND VOMITING THAT IS NOT CONTROLLED WITH YOUR NAUSEA MEDICATION  *UNUSUAL SHORTNESS OF BREATH  *UNUSUAL BRUISING OR BLEEDING  TENDERNESS IN MOUTH AND THROAT WITH OR WITHOUT PRESENCE OF ULCERS  *URINARY PROBLEMS  *BOWEL PROBLEMS  UNUSUAL RASH Items with * indicate a potential emergency and should be followed up as soon as possible.  Feel free to call the clinic should you have any questions or concerns. The clinic phone number is [phone].  Please show the CHEMO ALERT CARD at check-in to the Emergency Department and triage nurse.   

## 2019-03-16 ENCOUNTER — Encounter: Payer: Self-pay | Admitting: *Deleted

## 2019-03-19 ENCOUNTER — Other Ambulatory Visit: Payer: Self-pay | Admitting: Family Medicine

## 2019-03-24 ENCOUNTER — Telehealth: Payer: Self-pay | Admitting: Family Medicine

## 2019-03-24 NOTE — Telephone Encounter (Signed)
Medication Refill - Medication: amphetamine-dextroamphetamine (ADDERALL XR) 30 MG 24 hr capsule  Refill request  Preferred Pharmacy (with phone number or street name):  [id number] [place], [place]. AT [address] [phone] (Phone) [phone] (Fax)

## 2019-03-26 ENCOUNTER — Inpatient Hospital Stay: Payer: BC Managed Care – PPO | Attending: Oncology

## 2019-03-26 ENCOUNTER — Inpatient Hospital Stay: Payer: BC Managed Care – PPO

## 2019-03-26 ENCOUNTER — Other Ambulatory Visit: Payer: Self-pay | Admitting: Family Medicine

## 2019-03-26 ENCOUNTER — Other Ambulatory Visit: Payer: Self-pay

## 2019-03-26 VITALS — BP 151/75 | HR 87 | Temp 98.2°F | Resp 20 | Wt 212.5 lb

## 2019-03-26 DIAGNOSIS — C50212 Malignant neoplasm of upper-inner quadrant of left female breast: Secondary | ICD-10-CM

## 2019-03-26 DIAGNOSIS — Z5112 Encounter for antineoplastic immunotherapy: Secondary | ICD-10-CM | POA: Diagnosis not present

## 2019-03-26 DIAGNOSIS — Z170 Estrogen receptor positive status [ER+]: Secondary | ICD-10-CM

## 2019-03-26 DIAGNOSIS — C50312 Malignant neoplasm of lower-inner quadrant of left female breast: Secondary | ICD-10-CM | POA: Insufficient documentation

## 2019-03-26 DIAGNOSIS — Z95828 Presence of other vascular implants and grafts: Secondary | ICD-10-CM

## 2019-03-26 LAB — CBC WITH DIFFERENTIAL/PLATELET
Abs Immature Granulocytes: 0.02 10*3/uL (ref 0.00–0.07)
Basophils Absolute: 0 10*3/uL (ref 0.0–0.1)
Basophils Relative: 1 %
Eosinophils Absolute: 0.6 10*3/uL — ABNORMAL HIGH (ref 0.0–0.5)
Eosinophils Relative: 9 %
HCT: 38.5 % (ref 36.0–46.0)
Hemoglobin: 12.6 g/dL (ref 12.0–15.0)
Immature Granulocytes: 0 %
Lymphocytes Relative: 23 %
Lymphs Abs: 1.4 10*3/uL (ref 0.7–4.0)
MCH: 28.3 pg (ref 26.0–34.0)
MCHC: 32.7 g/dL (ref 30.0–36.0)
MCV: 86.3 fL (ref 80.0–100.0)
Monocytes Absolute: 0.6 10*3/uL (ref 0.1–1.0)
Monocytes Relative: 10 %
Neutro Abs: 3.5 10*3/uL (ref 1.7–7.7)
Neutrophils Relative %: 57 %
Platelets: 242 10*3/uL (ref 150–400)
RBC: 4.46 MIL/uL (ref 3.87–5.11)
RDW: 13 % (ref 11.5–15.5)
WBC: 6.2 10*3/uL (ref 4.0–10.5)
nRBC: 0 % (ref 0.0–0.2)

## 2019-03-26 LAB — COMPREHENSIVE METABOLIC PANEL
ALT: 22 U/L (ref 0–44)
AST: 19 U/L (ref 15–41)
Albumin: 3.8 g/dL (ref 3.5–5.0)
Alkaline Phosphatase: 79 U/L (ref 38–126)
Anion gap: 11 (ref 5–15)
BUN: 18 mg/dL (ref 8–23)
CO2: 24 mmol/L (ref 22–32)
Calcium: 9.2 mg/dL (ref 8.9–10.3)
Chloride: 105 mmol/L (ref 98–111)
Creatinine, Ser: 0.83 mg/dL (ref 0.44–1.00)
GFR calc Af Amer: >60 mL/min (ref >60)
GFR calc non Af Amer: >60 mL/min (ref >60)
Glucose, Bld: 100 mg/dL — ABNORMAL HIGH (ref 70–99)
Potassium: 3.9 mmol/L (ref 3.5–5.1)
Sodium: 140 mmol/L (ref 135–145)
Total Bilirubin: <0.2 mg/dL — ABNORMAL LOW (ref 0.3–1.2)
Total Protein: 6.9 g/dL (ref 6.5–8.1)

## 2019-03-26 MED ORDER — SODIUM CHLORIDE 0.9 % IV SOLN
Freq: Once | INTRAVENOUS | Status: AC
  Administered 2019-03-26: 09:00:00 via INTRAVENOUS
  Filled 2019-03-26: qty 250

## 2019-03-26 MED ORDER — HEPARIN SOD (PORK) LOCK FLUSH 100 UNIT/ML IV SOLN
500.0000 [IU] | Freq: Once | INTRAVENOUS | Status: AC | PRN
  Administered 2019-03-26: 10:00:00 500 [IU]
  Filled 2019-03-26: qty 5

## 2019-03-26 MED ORDER — ACETAMINOPHEN 325 MG PO TABS
ORAL_TABLET | ORAL | Status: AC
  Filled 2019-03-26: qty 2

## 2019-03-26 MED ORDER — SODIUM CHLORIDE 0.9% FLUSH
10.0000 mL | INTRAVENOUS | Status: DC | PRN
  Administered 2019-03-26: 10 mL
  Filled 2019-03-26: qty 10

## 2019-03-26 MED ORDER — ACETAMINOPHEN 325 MG PO TABS
650.0000 mg | ORAL_TABLET | Freq: Once | ORAL | Status: AC
  Administered 2019-03-26: 650 mg via ORAL

## 2019-03-26 MED ORDER — TRASTUZUMAB CHEMO 150 MG IV SOLR
600.0000 mg | Freq: Once | INTRAVENOUS | Status: AC
  Administered 2019-03-26: 600 mg via INTRAVENOUS
  Filled 2019-03-26: qty 28.57

## 2019-03-26 MED ORDER — DIPHENHYDRAMINE HCL 25 MG PO CAPS
ORAL_CAPSULE | ORAL | Status: AC
  Filled 2019-03-26: qty 1

## 2019-03-26 MED ORDER — DIPHENHYDRAMINE HCL 25 MG PO CAPS
25.0000 mg | ORAL_CAPSULE | Freq: Once | ORAL | Status: AC
  Administered 2019-03-26: 25 mg via ORAL

## 2019-03-26 NOTE — Patient Instructions (Signed)
[facility] Discharge Instructions for Patients Receiving Chemotherapy  Today you received the following chemotherapy agents Herceptin  To help prevent nausea and vomiting after your treatment, we encourage you to take your nausea medication as directed   If you develop nausea and vomiting that is not controlled by your nausea medication, call the clinic.   BELOW ARE SYMPTOMS THAT SHOULD BE REPORTED IMMEDIATELY:  *FEVER GREATER THAN 100.5 F  *CHILLS WITH OR WITHOUT FEVER  NAUSEA AND VOMITING THAT IS NOT CONTROLLED WITH YOUR NAUSEA MEDICATION  *UNUSUAL SHORTNESS OF BREATH  *UNUSUAL BRUISING OR BLEEDING  TENDERNESS IN MOUTH AND THROAT WITH OR WITHOUT PRESENCE OF ULCERS  *URINARY PROBLEMS  *BOWEL PROBLEMS  UNUSUAL RASH Items with * indicate a potential emergency and should be followed up as soon as possible.  Feel free to call the clinic should you have any questions or concerns. The clinic phone number is [phone].  Please show the CHEMO ALERT CARD at check-in to the Emergency Department and triage nurse.   

## 2019-03-26 NOTE — Telephone Encounter (Signed)
Should have one refill left at pharmacy.

## 2019-03-26 NOTE — Telephone Encounter (Signed)
Called patient and LMOVM to return call  [place] for [facility] to Discuss results / PCP / recommendations / Schedule patient  Left message to let patient know that she should have one refill left at the pharmacy.  CRM Created.

## 2019-03-26 NOTE — Telephone Encounter (Signed)
Last OV 11/11/18, No future OV  Last filled 02/15/19, # 30 with 1 refill

## 2019-03-29 NOTE — Telephone Encounter (Signed)
Last OV 11/11/18, no future OV  Last filled 08/20/18, # 30 with 5 refills

## 2019-04-02 ENCOUNTER — Telehealth: Payer: Self-pay | Admitting: *Deleted

## 2019-04-02 NOTE — Telephone Encounter (Signed)
Pt call with questions regarding FMLA paperwork. Gave pt number for Tiffany Montes to discuss. Informed pt she may have her dental ental and yearly physical.

## 2019-04-05 ENCOUNTER — Telehealth: Payer: Self-pay | Admitting: Radiation Oncology

## 2019-04-05 NOTE — Telephone Encounter (Signed)
  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  Interval Since Last Radiation:  6 weeks   02/04/2019-03/04/2019: The patient received 50.56 Gy over 16 fractions to the left breast followed by a 8 Gy boost in 4 fractions to the surgical cavity.  Narrative:  The patient was contacted today for routine follow-up. During treatment she did very well with radiotherapy and did not have significant desquamation.   Impression/Plan: 1. 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 I had to leave a message for the patient but encouraged her to call back so we can discuss skin care. She will continue with Dr. Jana Hakim and see [place] as needed.     Carola Rhine, PAC

## 2019-04-16 ENCOUNTER — Other Ambulatory Visit: Payer: Self-pay

## 2019-04-16 ENCOUNTER — Inpatient Hospital Stay: Payer: BC Managed Care – PPO

## 2019-04-16 ENCOUNTER — Inpatient Hospital Stay: Payer: BC Managed Care – PPO | Attending: Oncology

## 2019-04-16 VITALS — BP 134/69 | HR 77 | Temp 98.5°F | Resp 20 | Wt 213.5 lb

## 2019-04-16 DIAGNOSIS — C50212 Malignant neoplasm of upper-inner quadrant of left female breast: Secondary | ICD-10-CM

## 2019-04-16 DIAGNOSIS — Z5112 Encounter for antineoplastic immunotherapy: Secondary | ICD-10-CM | POA: Diagnosis not present

## 2019-04-16 DIAGNOSIS — C50312 Malignant neoplasm of lower-inner quadrant of left female breast: Secondary | ICD-10-CM | POA: Insufficient documentation

## 2019-04-16 DIAGNOSIS — Z170 Estrogen receptor positive status [ER+]: Secondary | ICD-10-CM

## 2019-04-16 DIAGNOSIS — Z95828 Presence of other vascular implants and grafts: Secondary | ICD-10-CM

## 2019-04-16 LAB — CBC WITH DIFFERENTIAL/PLATELET
Abs Immature Granulocytes: 0.03 10*3/uL (ref 0.00–0.07)
Basophils Absolute: 0.1 10*3/uL (ref 0.0–0.1)
Basophils Relative: 1 %
Eosinophils Absolute: 0.3 10*3/uL (ref 0.0–0.5)
Eosinophils Relative: 6 %
HCT: 38 % (ref 36.0–46.0)
Hemoglobin: 12.4 g/dL (ref 12.0–15.0)
Immature Granulocytes: 1 %
Lymphocytes Relative: 21 %
Lymphs Abs: 1.3 10*3/uL (ref 0.7–4.0)
MCH: 28.2 pg (ref 26.0–34.0)
MCHC: 32.6 g/dL (ref 30.0–36.0)
MCV: 86.4 fL (ref 80.0–100.0)
Monocytes Absolute: 0.5 10*3/uL (ref 0.1–1.0)
Monocytes Relative: 9 %
Neutro Abs: 3.7 10*3/uL (ref 1.7–7.7)
Neutrophils Relative %: 62 %
Platelets: 237 10*3/uL (ref 150–400)
RBC: 4.4 MIL/uL (ref 3.87–5.11)
RDW: 13 % (ref 11.5–15.5)
WBC: 5.9 10*3/uL (ref 4.0–10.5)
nRBC: 0 % (ref 0.0–0.2)

## 2019-04-16 LAB — COMPREHENSIVE METABOLIC PANEL
ALT: 20 U/L (ref 0–44)
AST: 20 U/L (ref 15–41)
Albumin: 3.7 g/dL (ref 3.5–5.0)
Alkaline Phosphatase: 91 U/L (ref 38–126)
Anion gap: 13 (ref 5–15)
BUN: 25 mg/dL — ABNORMAL HIGH (ref 8–23)
CO2: 24 mmol/L (ref 22–32)
Calcium: 9.1 mg/dL (ref 8.9–10.3)
Chloride: 105 mmol/L (ref 98–111)
Creatinine, Ser: 0.91 mg/dL (ref 0.44–1.00)
GFR calc Af Amer: >60 mL/min (ref >60)
GFR calc non Af Amer: >60 mL/min (ref >60)
Glucose, Bld: 115 mg/dL — ABNORMAL HIGH (ref 70–99)
Potassium: 3.8 mmol/L (ref 3.5–5.1)
Sodium: 142 mmol/L (ref 135–145)
Total Bilirubin: <0.2 mg/dL — ABNORMAL LOW (ref 0.3–1.2)
Total Protein: 6.9 g/dL (ref 6.5–8.1)

## 2019-04-16 MED ORDER — SODIUM CHLORIDE 0.9% FLUSH
10.0000 mL | INTRAVENOUS | Status: DC | PRN
  Administered 2019-04-16: 10 mL
  Filled 2019-04-16: qty 10

## 2019-04-16 MED ORDER — ACETAMINOPHEN 325 MG PO TABS
ORAL_TABLET | ORAL | Status: AC
  Filled 2019-04-16: qty 2

## 2019-04-16 MED ORDER — TRASTUZUMAB CHEMO 150 MG IV SOLR
600.0000 mg | Freq: Once | INTRAVENOUS | Status: AC
  Administered 2019-04-16: 600 mg via INTRAVENOUS
  Filled 2019-04-16: qty 28.57

## 2019-04-16 MED ORDER — ACETAMINOPHEN 325 MG PO TABS
650.0000 mg | ORAL_TABLET | Freq: Once | ORAL | Status: AC
  Administered 2019-04-16: 650 mg via ORAL

## 2019-04-16 MED ORDER — TRASTUZUMAB CHEMO 150 MG IV SOLR: 6.0000 mg/kg | Freq: Once | INTRAVENOUS | Status: DC

## 2019-04-16 MED ORDER — SODIUM CHLORIDE 0.9 % IV SOLN
Freq: Once | INTRAVENOUS | Status: AC
  Administered 2019-04-16: 10:00:00 via INTRAVENOUS
  Filled 2019-04-16: qty 250

## 2019-04-16 MED ORDER — DIPHENHYDRAMINE HCL 25 MG PO CAPS
ORAL_CAPSULE | ORAL | Status: AC
  Filled 2019-04-16: qty 1

## 2019-04-16 MED ORDER — DIPHENHYDRAMINE HCL 25 MG PO CAPS
25.0000 mg | ORAL_CAPSULE | Freq: Once | ORAL | Status: AC
  Administered 2019-04-16: 25 mg via ORAL

## 2019-04-16 MED ORDER — HEPARIN SOD (PORK) LOCK FLUSH 100 UNIT/ML IV SOLN
500.0000 [IU] | Freq: Once | INTRAVENOUS | Status: AC | PRN
  Administered 2019-04-16: 500 [IU]
  Filled 2019-04-16: qty 5

## 2019-04-16 NOTE — Patient Instructions (Signed)
[facility] Discharge Instructions for Patients Receiving Chemotherapy  Today you received the following chemotherapy agents Trastuzumab (HERCEPTIN).  To help prevent nausea and vomiting after your treatment, we encourage you to take your nausea medication as prescribed.  If you develop nausea and vomiting that is not controlled by your nausea medication, call the clinic.   BELOW ARE SYMPTOMS THAT SHOULD BE REPORTED IMMEDIATELY:  *FEVER GREATER THAN 100.5 F  *CHILLS WITH OR WITHOUT FEVER  NAUSEA AND VOMITING THAT IS NOT CONTROLLED WITH YOUR NAUSEA MEDICATION  *UNUSUAL SHORTNESS OF BREATH  *UNUSUAL BRUISING OR BLEEDING  TENDERNESS IN MOUTH AND THROAT WITH OR WITHOUT PRESENCE OF ULCERS  *URINARY PROBLEMS  *BOWEL PROBLEMS  UNUSUAL RASH Items with * indicate a potential emergency and should be followed up as soon as possible.  Feel free to call the clinic should you have any questions or concerns. The clinic phone number is [phone].  Please show the CHEMO ALERT CARD at check-in to the Emergency Department and triage nurse.  Coronavirus (COVID-19) Are you at risk?  Are you at risk for the Coronavirus (COVID-19)?  To be considered HIGH RISK for Coronavirus (COVID-19), you have to meet the following criteria:  . Traveled to [place], [place], [place], [place] or [place]; or in the [place] to [place], [place], [place], or [place]; and have fever, cough, and shortness of breath within the last 2 weeks of travel OR . Been in close contact with a person diagnosed with COVID-19 within the last 2 weeks and have fever, cough, and shortness of breath . IF YOU DO NOT MEET THESE CRITERIA, YOU ARE CONSIDERED LOW RISK FOR COVID-19.  What to do if you are HIGH RISK for COVID-19?  . If you are having a medical emergency, call 911. . Seek medical care right away. Before you go to a doctor's office, urgent care or emergency department, call ahead and tell them  about your recent travel, contact with someone diagnosed with COVID-19, and your symptoms. You should receive instructions from your physician's office regarding next steps of care.  . When you arrive at healthcare provider, tell the healthcare staff immediately you have returned from visiting [place], [place], [place], [place] or [place]; or traveled in the [place] to [place], [place], [place], or [place]; in the last two weeks or you have been in close contact with a person diagnosed with COVID-19 in the last 2 weeks.   . Tell the health care staff about your symptoms: fever, cough and shortness of breath. . After you have been seen by a medical provider, you will be either: o Tested for (COVID-19) and discharged home on quarantine except to seek medical care if symptoms worsen, and asked to  - Stay home and avoid contact with others until you get your results (4-5 days)  - Avoid travel on public transportation if possible (such as bus, train, or airplane) or o Sent to the Emergency Department by EMS for evaluation, COVID-19 testing, and possible admission depending on your condition and test results.  What to do if you are LOW RISK for COVID-19?  Reduce your risk of any infection by using the same precautions used for avoiding the common cold or flu:  . Wash your hands often with soap and warm water for at least 20 seconds.  If soap and water are not readily available, use an alcohol-based hand sanitizer with at least 60% alcohol.  . If coughing or sneezing,   cover your mouth and nose by coughing or sneezing into the elbow areas of your shirt or coat, into a tissue or into your sleeve (not your hands). . Avoid shaking hands with others and consider head nods or verbal greetings only. . Avoid touching your eyes, nose, or mouth with unwashed hands.  . Avoid close contact with people who are sick. . Avoid places or events with large numbers of people in one location, like concerts or  sporting events. . Carefully consider travel plans you have or are making. . If you are planning any travel outside or inside the [place], visit the CDC's Travelers' Health webpage for the latest health notices. . If you have some symptoms but not all symptoms, continue to monitor at home and seek medical attention if your symptoms worsen. . If you are having a medical emergency, call 911.   ADDITIONAL HEALTHCARE OPTIONS FOR PATIENTS  [facility] / e-Visit: [email or web address]         [facility]: [phone]   Urgent Care: [phone]                   [facility]: [phone]   

## 2019-04-19 ENCOUNTER — Ambulatory Visit (INDEPENDENT_AMBULATORY_CARE_PROVIDER_SITE_OTHER): Payer: BC Managed Care – PPO | Admitting: Family Medicine

## 2019-04-19 ENCOUNTER — Encounter: Payer: Self-pay | Admitting: Family Medicine

## 2019-04-19 ENCOUNTER — Other Ambulatory Visit: Payer: Self-pay

## 2019-04-19 VITALS — BP 148/82 | HR 80 | Temp 97.2°F | Ht 62.25 in | Wt 212.7 lb

## 2019-04-19 DIAGNOSIS — Z0000 Encounter for general adult medical examination without abnormal findings: Secondary | ICD-10-CM

## 2019-04-19 DIAGNOSIS — M7062 Trochanteric bursitis, left hip: Secondary | ICD-10-CM

## 2019-04-19 DIAGNOSIS — Z23 Encounter for immunization: Secondary | ICD-10-CM | POA: Diagnosis not present

## 2019-04-19 LAB — HEMOGLOBIN A1C: Hgb A1c MFr Bld: 6.2 % (ref 4.6–6.5)

## 2019-04-19 LAB — LIPID PANEL
Cholesterol: 267 mg/dL — ABNORMAL HIGH (ref 0–200)
HDL: 60.3 mg/dL (ref >39.00)
LDL Cholesterol: 179 mg/dL — ABNORMAL HIGH (ref 0–99)
NonHDL: 206.86
Total CHOL/HDL Ratio: 4
Triglycerides: 137 mg/dL (ref 0.0–149.0)
VLDL: 27.4 mg/dL (ref 0.0–40.0)

## 2019-04-19 LAB — TSH: TSH: 1.71 u[IU]/mL (ref 0.35–4.50)

## 2019-04-19 MED ORDER — SERTRALINE HCL 100 MG PO TABS: 100.0000 mg | ORAL_TABLET | Freq: Every day | ORAL | 3 refills | Status: DC

## 2019-04-19 MED ORDER — LOSARTAN POTASSIUM-HCTZ 100-12.5 MG PO TABS: 1.0000 | ORAL_TABLET | Freq: Every day | ORAL | 3 refills | Status: DC

## 2019-04-19 MED ORDER — METHYLPREDNISOLONE ACETATE 80 MG/ML IJ SUSP
80.0000 mg | Freq: Once | INTRAMUSCULAR | Status: AC
  Administered 2019-04-19: 80 mg via INTRA_ARTICULAR

## 2019-04-19 NOTE — Progress Notes (Addendum)
Subjective:     Patient ID: Tiffany Montes, female   DOB: [birth year], 63 y.o.   MRN: [id number]  HPI Patient is here for physical exam.  She had left breast cancer which was estrogen receptor positive diagnosed last year.  She is completed her radiation therapy and chemotherapy.  She is on anastrozole.\  Chronic problems include history of hypertension, hypothyroidism, hyperlipidemia, history of recurrent depression, adult ADD, history of prediabetes, obesity.  She has unfortunately gained some weight during the past year.  She is currently out of work.  She has worked previously at [employer] improvement but is worried because of her increased risk of getting COVID-19 infection  She apparently is due for repeat colonoscopy and is trying to get in touch with GI regarding that. Previous hepatitis C screen negative.  Last Pap smear 01/21/2018 per gynecology and reportedly normal.  She is overdue for tetanus.  Generally feels well.  Her biggest concern is been difficulties with being isolated in way from others.  She is had some ongoing depression symptoms.  She has been getting regular CMP and CBC levels  The 10-year ASCVD risk score Mikey Bussing DC Jr., et al., 2013) is: 10%   Values used to calculate the score:     Age: 32 years     Sex: Female     Is Non-Hispanic African American: No     Diabetic: No     Tobacco smoker: No     Systolic Blood Pressure: 325 mmHg     Is BP treated: Yes     HDL Cholesterol: 60.3 mg/dL     Total Cholesterol: 267 mg/dL   Past Medical History:  Diagnosis Date  . Anxiety   . Attention deficit disorder   . Breast cancer ([place])   . Depression   . Gallstones 09/05/2011  . Hyperlipidemia   . Hypertension   . Hypothyroidism   . Migraines   . PONV (postoperative nausea and vomiting)    diffficulty waking up  . Thyroid disease    hypothyroid   Past Surgical History:  Procedure Laterality Date  . ABDOMINAL HYSTERECTOMY  2003  . BREAST BIOPSY Left 2014    benign  . BREAST LUMPECTOMY WITH RADIOACTIVE SEED AND SENTINEL LYMPH NODE BIOPSY Left 09/07/2018   Procedure: LEFT BREAST LUMPECTOMY WITH RADIOACTIVE SEED AND AXILLARY SENTINEL LYMPH NODE BIOPSY,INJECT BLUE DYE LEFT BREAST;  Surgeon: Fanny Skates, MD;  Location: [facility];  Service: General;  Laterality: Left;  . CHOLECYSTECTOMY  09/26/2011   Procedure: LAPAROSCOPIC CHOLECYSTECTOMY WITH INTRAOPERATIVE CHOLANGIOGRAM;  Surgeon: Haywood Lasso, MD;  Location: [facility];  Service: General;  Laterality: N/A;  . COLONOSCOPY    . CYSTIC HYGROMA EXCISION    . DENTAL SURGERY     skin graft from top of mouth to lower gums  . PORTACATH PLACEMENT N/A 09/07/2018   Procedure: INSERTION PORT-A-CATH WITH [place];  Surgeon: Fanny Skates, MD;  Location: [facility];  Service: General;  Laterality: N/A;    reports that she has never smoked. She has never used smokeless tobacco. She reports current alcohol use. She reports that she does not use drugs. family history includes Diabetes in her father; Heart disease in her father; Lung cancer in her father. Allergies  Allergen Reactions  . Morphine Sulfate Nausea And Vomiting and Rash    GI upset     Review of Systems  Constitutional: Positive for fatigue. Negative for activity change, appetite change, fever and unexpected weight change.  HENT: Negative for ear  pain, hearing loss, sore throat and trouble swallowing.   Eyes: Negative for visual disturbance.  Respiratory: Negative for cough and shortness of breath.   Cardiovascular: Negative for chest pain and palpitations.  Gastrointestinal: Negative for abdominal pain, blood in stool, constipation and diarrhea.  Genitourinary: Negative for dysuria and hematuria.  Musculoskeletal: Negative for arthralgias, back pain and myalgias.  Skin: Negative for rash.  Neurological: Negative for dizziness, syncope and headaches.  Hematological: Negative for adenopathy. Does not bruise/bleed easily.  Psychiatric/Behavioral: Positive  for dysphoric mood. Negative for confusion and suicidal ideas.       Objective:   Physical Exam Constitutional:      Appearance: She is well-developed.  HENT:     Head: Normocephalic and atraumatic.  Eyes:     Pupils: Pupils are equal, round, and reactive to light.  Neck:     Musculoskeletal: Normal range of motion and neck supple.     Thyroid: No thyromegaly.  Cardiovascular:     Rate and Rhythm: Normal rate and regular rhythm.     Heart sounds: Normal heart sounds. No murmur.  Pulmonary:     Effort: No respiratory distress.     Breath sounds: Normal breath sounds. No wheezing or rales.  Abdominal:     General: Bowel sounds are normal. There is no distension.     Palpations: Abdomen is soft. There is no mass.     Tenderness: There is no abdominal tenderness. There is no guarding or rebound.  Genitourinary:    Comments: Per GYN Musculoskeletal: Normal range of motion.  Lymphadenopathy:     Cervical: No cervical adenopathy.  Skin:    Findings: No rash.  Neurological:     Mental Status: She is alert and oriented to person, place, and time.     Cranial Nerves: No cranial nerve deficit.     Deep Tendon Reflexes: Reflexes normal.  Psychiatric:        Behavior: Behavior normal.        Thought Content: Thought content normal.        Judgment: Judgment normal.        Assessment:     Physical exam.  Patient has had recent diagnosis as above of left breast cancer.  Her treatments have gone fairly well.  We discussed the following health maintenance issues today    Plan:     -She had previous Zostavax 2017.  We discussed Shingrix but at this point she wishes to wait -Titrate losartan from 50 to 100 mg daily and reassess blood pressure in 1 month -We discussed titration of sertraline from 50 to 100 mg as she has had some persistent depression symptoms -Patient needs to set up repeat colonoscopy and she is currently in process of checking with GI regarding that -Check further  labs with hemoglobin A1c, TSH, lipids.  We did not check CBC or chemistries because these were done recently through oncology -Continue with yearly flu vaccine -Strongly encouraged to try to lose some weight.  Discussed further at follow-up in 1 month  Eulas Post MD [facility] at [facility]

## 2019-04-19 NOTE — Patient Instructions (Addendum)
Set up repeat colonoscopy.  Continue with yearly flu vaccine.  Let's increase the Zoloft to 100 mg and the Losartan to 100 mg

## 2019-04-28 ENCOUNTER — Other Ambulatory Visit: Payer: Self-pay | Admitting: Family Medicine

## 2019-04-28 ENCOUNTER — Other Ambulatory Visit: Payer: Self-pay

## 2019-04-28 DIAGNOSIS — E785 Hyperlipidemia, unspecified: Secondary | ICD-10-CM

## 2019-04-28 MED ORDER — ROSUVASTATIN CALCIUM 20 MG PO TABS: 20.0000 mg | ORAL_TABLET | Freq: Every day | ORAL | 0 refills | Status: DC

## 2019-05-01 ENCOUNTER — Other Ambulatory Visit: Payer: Self-pay | Admitting: Family Medicine

## 2019-05-04 ENCOUNTER — Telehealth: Payer: Self-pay | Admitting: Family Medicine

## 2019-05-04 NOTE — Telephone Encounter (Signed)
Pt called and stated she feels that her Rx for ADDERALL sometimes last too long and it can keep her from sleeping so she wants to know if she can get the refill to be for the one that her daughter takes which are the amphetamine-dextroamphetamine (ADDERALL) 30 MG tablet  Please advise or sent to  Pharmacy if ok with change

## 2019-05-04 NOTE — Telephone Encounter (Signed)
Please see message. °

## 2019-05-04 NOTE — Telephone Encounter (Signed)
Pt called in to request a refill for amphetamine-dextroamphetamine (ADDERALL XR) 30 MG 24 hr capsule   Pt says that she need a refill on medication but want to change to the 2 a day that is not as strong. Pt says that she is currently taking XR which is to strong for her now.    [facility]: [id number] - [place], [place] - [address]. AT [address]

## 2019-05-06 NOTE — Telephone Encounter (Signed)
Set up Doxy to discuss.  15 minutes OK.

## 2019-05-07 ENCOUNTER — Inpatient Hospital Stay: Payer: BC Managed Care – PPO

## 2019-05-07 ENCOUNTER — Other Ambulatory Visit: Payer: Self-pay

## 2019-05-07 VITALS — BP 144/88 | HR 77 | Temp 98.0°F | Resp 18

## 2019-05-07 DIAGNOSIS — C50312 Malignant neoplasm of lower-inner quadrant of left female breast: Secondary | ICD-10-CM | POA: Diagnosis not present

## 2019-05-07 DIAGNOSIS — C50212 Malignant neoplasm of upper-inner quadrant of left female breast: Secondary | ICD-10-CM

## 2019-05-07 DIAGNOSIS — Z95828 Presence of other vascular implants and grafts: Secondary | ICD-10-CM

## 2019-05-07 DIAGNOSIS — Z5112 Encounter for antineoplastic immunotherapy: Secondary | ICD-10-CM | POA: Diagnosis not present

## 2019-05-07 DIAGNOSIS — Z170 Estrogen receptor positive status [ER+]: Secondary | ICD-10-CM

## 2019-05-07 LAB — COMPREHENSIVE METABOLIC PANEL
ALT: 19 U/L (ref 0–44)
AST: 17 U/L (ref 15–41)
Albumin: 3.8 g/dL (ref 3.5–5.0)
Alkaline Phosphatase: 91 U/L (ref 38–126)
Anion gap: 8 (ref 5–15)
BUN: 18 mg/dL (ref 8–23)
CO2: 26 mmol/L (ref 22–32)
Calcium: 9.2 mg/dL (ref 8.9–10.3)
Chloride: 106 mmol/L (ref 98–111)
Creatinine, Ser: 0.91 mg/dL (ref 0.44–1.00)
GFR calc Af Amer: >60 mL/min (ref >60)
GFR calc non Af Amer: >60 mL/min (ref >60)
Glucose, Bld: 108 mg/dL — ABNORMAL HIGH (ref 70–99)
Potassium: 3.4 mmol/L — ABNORMAL LOW (ref 3.5–5.1)
Sodium: 140 mmol/L (ref 135–145)
Total Bilirubin: 0.3 mg/dL (ref 0.3–1.2)
Total Protein: 6.9 g/dL (ref 6.5–8.1)

## 2019-05-07 LAB — CBC WITH DIFFERENTIAL/PLATELET
Abs Immature Granulocytes: 0.02 10*3/uL (ref 0.00–0.07)
Basophils Absolute: 0 10*3/uL (ref 0.0–0.1)
Basophils Relative: 1 %
Eosinophils Absolute: 0.5 10*3/uL (ref 0.0–0.5)
Eosinophils Relative: 8 %
HCT: 40.4 % (ref 36.0–46.0)
Hemoglobin: 13.2 g/dL (ref 12.0–15.0)
Immature Granulocytes: 0 %
Lymphocytes Relative: 19 %
Lymphs Abs: 1.2 10*3/uL (ref 0.7–4.0)
MCH: 28 pg (ref 26.0–34.0)
MCHC: 32.7 g/dL (ref 30.0–36.0)
MCV: 85.8 fL (ref 80.0–100.0)
Monocytes Absolute: 0.6 10*3/uL (ref 0.1–1.0)
Monocytes Relative: 9 %
Neutro Abs: 4 10*3/uL (ref 1.7–7.7)
Neutrophils Relative %: 63 %
Platelets: 203 10*3/uL (ref 150–400)
RBC: 4.71 MIL/uL (ref 3.87–5.11)
RDW: 13.1 % (ref 11.5–15.5)
WBC: 6.3 10*3/uL (ref 4.0–10.5)
nRBC: 0 % (ref 0.0–0.2)

## 2019-05-07 MED ORDER — DIPHENHYDRAMINE HCL 25 MG PO CAPS
ORAL_CAPSULE | ORAL | Status: AC
  Filled 2019-05-07: qty 1

## 2019-05-07 MED ORDER — HEPARIN SOD (PORK) LOCK FLUSH 100 UNIT/ML IV SOLN
500.0000 [IU] | Freq: Once | INTRAVENOUS | Status: AC | PRN
  Administered 2019-05-07: 500 [IU]
  Filled 2019-05-07: qty 5

## 2019-05-07 MED ORDER — DIPHENHYDRAMINE HCL 25 MG PO CAPS
25.0000 mg | ORAL_CAPSULE | Freq: Once | ORAL | Status: AC
  Administered 2019-05-07: 25 mg via ORAL

## 2019-05-07 MED ORDER — ACETAMINOPHEN 325 MG PO TABS
ORAL_TABLET | ORAL | Status: AC
  Filled 2019-05-07: qty 2

## 2019-05-07 MED ORDER — SODIUM CHLORIDE 0.9% FLUSH
10.0000 mL | INTRAVENOUS | Status: DC | PRN
  Administered 2019-05-07: 10 mL
  Filled 2019-05-07: qty 10

## 2019-05-07 MED ORDER — SODIUM CHLORIDE 0.9 % IV SOLN
Freq: Once | INTRAVENOUS | Status: AC
  Administered 2019-05-07: 09:00:00 via INTRAVENOUS
  Filled 2019-05-07: qty 250

## 2019-05-07 MED ORDER — SODIUM CHLORIDE 0.9% FLUSH
10.0000 mL | INTRAVENOUS | Status: DC | PRN
  Administered 2019-05-07: 09:00:00 10 mL
  Filled 2019-05-07: qty 10

## 2019-05-07 MED ORDER — TRASTUZUMAB CHEMO 150 MG IV SOLR
6.0000 mg/kg | Freq: Once | INTRAVENOUS | Status: AC
  Administered 2019-05-07: 546 mg via INTRAVENOUS
  Filled 2019-05-07: qty 26

## 2019-05-07 MED ORDER — ACETAMINOPHEN 325 MG PO TABS
650.0000 mg | ORAL_TABLET | Freq: Once | ORAL | Status: AC
  Administered 2019-05-07: 650 mg via ORAL

## 2019-05-07 NOTE — Patient Instructions (Signed)
[facility] Discharge Instructions for Patients Receiving Chemotherapy  Today you received the following chemotherapy agents herceptin.  To help prevent nausea and vomiting after your treatment, we encourage you to take your nausea medication as directed.   If you develop nausea and vomiting that is not controlled by your nausea medication, call the clinic.   BELOW ARE SYMPTOMS THAT SHOULD BE REPORTED IMMEDIATELY:  *FEVER GREATER THAN 100.5 F  *CHILLS WITH OR WITHOUT FEVER  NAUSEA AND VOMITING THAT IS NOT CONTROLLED WITH YOUR NAUSEA MEDICATION  *UNUSUAL SHORTNESS OF BREATH  *UNUSUAL BRUISING OR BLEEDING  TENDERNESS IN MOUTH AND THROAT WITH OR WITHOUT PRESENCE OF ULCERS  *URINARY PROBLEMS  *BOWEL PROBLEMS  UNUSUAL RASH Items with * indicate a potential emergency and should be followed up as soon as possible.  Feel free to call the clinic should you have any questions or concerns. The clinic phone number is [phone].  Please show the CHEMO ALERT CARD at check-in to the Emergency Department and triage nurse.   

## 2019-05-07 NOTE — Telephone Encounter (Signed)
Called patient and LMOVM to return call  [place] for [facility] to Discuss results / PCP / recommendations / Schedule patient  Per Dr. Elease Hashimoto: Set up Doxy to discuss.  15 minutes OK.  Called and left patient a detailed voice message letting her know we need to do a virtual visit per Dr. Elease Hashimoto.  CRM Created.

## 2019-05-09 NOTE — Progress Notes (Signed)
The following biosimilar Ogivri (trastuzumab-dkst) has been selected for use in this patient.  Kennith Center, Pharm.D., CPP 05/09/2019@9 :31 AM

## 2019-05-11 NOTE — Progress Notes (Signed)
  Radiation Oncology         [phone] ________________________________  Name: Tiffany Montes MRN: [id number]  Date: 03/04/2019  DOB: 09-25-1956  End of Treatment Note  Diagnosis:   63 y.o. 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 The patient initially received a dose of 42.56 Gy in 16 fractions to the left breast using whole-breast tangent fields. This was delivered using a 3-D conformal technique.  2. The patient then received a boost to the seroma. This delivered an additional 8 Gy in 4 fractions using an en face electron field due to the depth of the seroma. The total dose was 50.56 Gy.  Beams/energy:    1. 3D / 10X Photon 2. 12E electron boost  Narrative: The patient tolerated radiation treatment relatively well.   The patient had some expected skin irritation with mild erythema as she progressed during treatment. Moist desquamation was not present at the end of treatment. She also noted moderate fatigue.  Plan: The patient has completed radiation treatment. The patient will return to radiation oncology clinic for routine followup in one month. I advised the patient to call or return sooner if they have any questions or concerns related to their recovery or treatment. ________________________________  Jodelle Gross, MD, PhD  This document serves as a record of services personally performed by Kyung Rudd, MD. It was created on his behalf by Rae Lips, a trained medical scribe. The creation of this record is based on the scribe's personal observations and the provider's statements to them. This document has been checked and approved by the attending provider.

## 2019-05-12 ENCOUNTER — Telehealth (INDEPENDENT_AMBULATORY_CARE_PROVIDER_SITE_OTHER): Payer: BC Managed Care – PPO | Admitting: Family Medicine

## 2019-05-12 ENCOUNTER — Other Ambulatory Visit: Payer: Self-pay

## 2019-05-12 DIAGNOSIS — G4700 Insomnia, unspecified: Secondary | ICD-10-CM

## 2019-05-12 DIAGNOSIS — F988 Other specified behavioral and emotional disorders with onset usually occurring in childhood and adolescence: Secondary | ICD-10-CM | POA: Diagnosis not present

## 2019-05-12 DIAGNOSIS — E785 Hyperlipidemia, unspecified: Secondary | ICD-10-CM | POA: Diagnosis not present

## 2019-05-12 MED ORDER — AMPHETAMINE-DEXTROAMPHETAMINE 20 MG PO TABS: 20.0000 mg | ORAL_TABLET | Freq: Every day | ORAL | 0 refills | Status: DC

## 2019-05-12 NOTE — Progress Notes (Signed)
This visit type was conducted due to national recommendations for restrictions regarding the COVID-19 pandemic in an effort to limit this patient's exposure and mitigate transmission in our community.   Virtual Visit via Video Note  I connected with Tiffany Montes on 05/12/19 at  9:30 AM EDT by a video enabled telemedicine application and verified that I am speaking with the correct person using two identifiers.  Location patient: home Location provider:work or home office Persons participating in the virtual visit: patient, provider  I discussed the limitations of evaluation and management by telemedicine and the availability of in person appointments. The patient expressed understanding and agreed to proceed.   HPI: Patient called recently with questions regarding her Adderall and possible medication change.  She has been on Adderall XR 30 mg once daily for several years.  However, she has had some recent issues with insomnia and would like to look at possible use of immediate release once daily.  She is currently out of work because of her breast cancer treatment and desire to avoid infection issues with the coronavirus.  She has not had any problems with major appetite suppression and has had some weight gain recently.  Other issue is she had recent physical.  Her lipids were very high.  We started Crestor 20 mg daily which she is tolerating with no side effects.  She has future lab order placed to get repeat lipids around the end of August or early September.   ROS: See pertinent positives and negatives per HPI.  Past Medical History:  Diagnosis Date  . Anxiety   . Attention deficit disorder   . Breast cancer ([place])   . Depression   . Gallstones 09/05/2011  . Hyperlipidemia   . Hypertension   . Hypothyroidism   . Migraines   . PONV (postoperative nausea and vomiting)    diffficulty waking up  . Thyroid disease    hypothyroid    Past Surgical History:  Procedure Laterality  Date  . ABDOMINAL HYSTERECTOMY  2003  . BREAST BIOPSY Left 2014   benign  . BREAST LUMPECTOMY WITH RADIOACTIVE SEED AND SENTINEL LYMPH NODE BIOPSY Left 09/07/2018   Procedure: LEFT BREAST LUMPECTOMY WITH RADIOACTIVE SEED AND AXILLARY SENTINEL LYMPH NODE BIOPSY,INJECT BLUE DYE LEFT BREAST;  Surgeon: Fanny Skates, MD;  Location: [facility];  Service: General;  Laterality: Left;  . CHOLECYSTECTOMY  09/26/2011   Procedure: LAPAROSCOPIC CHOLECYSTECTOMY WITH INTRAOPERATIVE CHOLANGIOGRAM;  Surgeon: Haywood Lasso, MD;  Location: [place];  Service: General;  Laterality: N/A;  . COLONOSCOPY    . CYSTIC HYGROMA EXCISION    . DENTAL SURGERY     skin graft from top of mouth to lower gums  . PORTACATH PLACEMENT N/A 09/07/2018   Procedure: INSERTION PORT-A-CATH WITH [place];  Surgeon: Fanny Skates, MD;  Location: [facility];  Service: General;  Laterality: N/A;    Family History  Problem Relation Age of Onset  . Diabetes Father   . Heart disease Father   . Lung cancer Father   . Breast cancer Neg Hx   . Ovarian cancer Neg Hx     SOCIAL HX: Non-smoker   Current Outpatient Medications:  .  amphetamine-dextroamphetamine (ADDERALL) 20 MG tablet, Take 1 tablet (20 mg total) by mouth daily., Disp: 30 tablet, Rfl: 0 .  anastrozole (ARIMIDEX) 1 MG tablet, Take 1 tablet (1 mg total) by mouth daily., Disp: 90 tablet, Rfl: 4 .  augmented betamethasone dipropionate (DIPROLENE-AF) 0.05 % cream, Apply 1 application topically 2 (two)  times daily as needed (for eczema)., Disp: 30 g, Rfl: 2 .  BIOTIN PO, Take 1 tablet by mouth daily., Disp: , Rfl:  .  buPROPion (WELLBUTRIN XL) 300 MG 24 hr tablet, TAKE 1 TABLET BY MOUTH EVERY DAY, Disp: 90 tablet, Rfl: 1 .  Cholecalciferol (VITAMIN D3 PO), Take 1 tablet by mouth daily., Disp: , Rfl:  .  clonazePAM (KLONOPIN) 0.5 MG tablet, TAKE 1 TABLET BY MOUTH EVERY DAY AT BEDTIME AS NEEDED, Disp: 30 tablet, Rfl: 5 .  fluticasone (FLONASE) 50 MCG/ACT nasal spray, SPRAY 2 SPRAYS IN  EACH NOSTRIL DAILY AS NEEDED FOR ALLERGIES, Disp: 48 mL, Rfl: 0 .  gabapentin (NEURONTIN) 300 MG capsule, Take 1 capsule (300 mg total) by mouth at bedtime., Disp: 90 capsule, Rfl: 4 .  levothyroxine (SYNTHROID) 125 MCG tablet, TAKE 1 TABLET BY MOUTH EVERY DAY, Disp: 30 tablet, Rfl: 0 .  losartan-hydrochlorothiazide (HYZAAR) 100-12.5 MG tablet, Take 1 tablet by mouth daily., Disp: 90 tablet, Rfl: 3 .  Multiple Vitamins-Minerals (MULTIVITAMINS THER. W/MINERALS) TABS, Take 1 tablet by mouth daily.  , Disp: , Rfl:  .  rosuvastatin (CRESTOR) 20 MG tablet, Take 1 tablet (20 mg total) by mouth daily., Disp: 90 tablet, Rfl: 0 .  sertraline (ZOLOFT) 100 MG tablet, Take 1 tablet (100 mg total) by mouth daily., Disp: 90 tablet, Rfl: 3 .  SUMAtriptan (IMITREX) 100 MG tablet, TAKE 1 TABLET BY MOUTH DAILY AS NEEDED FOR MIGRAINE. MAY REPEAT IN 24 HOURS., Disp: 9 tablet, Rfl: 2  EXAM:  VITALS per patient if applicable:  GENERAL: alert, oriented, appears well and in no acute distress  HEENT: atraumatic, conjunttiva clear, no obvious abnormalities on inspection of external nose and ears  NECK: normal movements of the head and neck  LUNGS: on inspection no signs of respiratory distress, breathing rate appears normal, no obvious gross SOB, gasping or wheezing  CV: no obvious cyanosis  MS: moves all visible extremities without noticeable abnormality  PSYCH/NEURO: pleasant and cooperative, no obvious depression or anxiety, speech and thought processing grossly intact  ASSESSMENT AND PLAN:  Discussed the following assessment and plan:  #1 attention deficit disorder.  She has had some issues recently with insomnia.  -We agreed to discontinue Adderall XR 30 mg daily and try immediate release Adderall 20 mg once daily.  She is aware that duration of effect will be much diminished.  She will give [place] some feedback in 1 month  #2 hyperlipidemia.  Recently started on Crestor and tolerating well  -Patient has  future lab order and will schedule those within the next month    I discussed the assessment and treatment plan with the patient. The patient was provided an opportunity to ask questions and all were answered. The patient agreed with the plan and demonstrated an understanding of the instructions.   The patient was advised to call back or seek an in-person evaluation if the symptoms worsen or if the condition fails to improve as anticipated.    Carolann Littler, MD

## 2019-05-13 DIAGNOSIS — D123 Benign neoplasm of transverse colon: Secondary | ICD-10-CM | POA: Diagnosis not present

## 2019-05-13 DIAGNOSIS — K5730 Diverticulosis of large intestine without perforation or abscess without bleeding: Secondary | ICD-10-CM | POA: Diagnosis not present

## 2019-05-13 DIAGNOSIS — Z86010 Personal history of colonic polyps: Secondary | ICD-10-CM | POA: Diagnosis not present

## 2019-05-13 DIAGNOSIS — K635 Polyp of colon: Secondary | ICD-10-CM | POA: Diagnosis not present

## 2019-05-13 DIAGNOSIS — Z1211 Encounter for screening for malignant neoplasm of colon: Secondary | ICD-10-CM | POA: Diagnosis not present

## 2019-05-26 ENCOUNTER — Other Ambulatory Visit: Payer: Self-pay | Admitting: Family Medicine

## 2019-05-27 ENCOUNTER — Other Ambulatory Visit: Payer: Self-pay | Admitting: *Deleted

## 2019-05-27 DIAGNOSIS — Z5111 Encounter for antineoplastic chemotherapy: Secondary | ICD-10-CM

## 2019-05-27 DIAGNOSIS — Z170 Estrogen receptor positive status [ER+]: Secondary | ICD-10-CM

## 2019-05-27 DIAGNOSIS — C50212 Malignant neoplasm of upper-inner quadrant of left female breast: Secondary | ICD-10-CM

## 2019-05-27 NOTE — Progress Notes (Signed)
Pt insurance prefers [place].  Orders changed to reflect this.

## 2019-05-28 ENCOUNTER — Inpatient Hospital Stay: Payer: BC Managed Care – PPO

## 2019-05-28 ENCOUNTER — Telehealth: Payer: Self-pay | Admitting: Oncology

## 2019-05-28 ENCOUNTER — Other Ambulatory Visit: Payer: Self-pay

## 2019-05-28 ENCOUNTER — Inpatient Hospital Stay: Payer: BC Managed Care – PPO | Attending: Oncology

## 2019-05-28 ENCOUNTER — Telehealth: Payer: Self-pay | Admitting: *Deleted

## 2019-05-28 VITALS — BP 136/84 | HR 70 | Temp 98.0°F | Resp 18 | Wt 211.5 lb

## 2019-05-28 DIAGNOSIS — C50212 Malignant neoplasm of upper-inner quadrant of left female breast: Secondary | ICD-10-CM

## 2019-05-28 DIAGNOSIS — C50312 Malignant neoplasm of lower-inner quadrant of left female breast: Secondary | ICD-10-CM | POA: Diagnosis not present

## 2019-05-28 DIAGNOSIS — Z5112 Encounter for antineoplastic immunotherapy: Secondary | ICD-10-CM | POA: Insufficient documentation

## 2019-05-28 DIAGNOSIS — Z95828 Presence of other vascular implants and grafts: Secondary | ICD-10-CM

## 2019-05-28 DIAGNOSIS — Z170 Estrogen receptor positive status [ER+]: Secondary | ICD-10-CM | POA: Diagnosis not present

## 2019-05-28 LAB — CBC WITH DIFFERENTIAL/PLATELET
Abs Immature Granulocytes: 0.03 10*3/uL (ref 0.00–0.07)
Basophils Absolute: 0 10*3/uL (ref 0.0–0.1)
Basophils Relative: 1 %
Eosinophils Absolute: 0.3 10*3/uL (ref 0.0–0.5)
Eosinophils Relative: 4 %
HCT: 38.1 % (ref 36.0–46.0)
Hemoglobin: 12.5 g/dL (ref 12.0–15.0)
Immature Granulocytes: 0 %
Lymphocytes Relative: 19 %
Lymphs Abs: 1.3 10*3/uL (ref 0.7–4.0)
MCH: 28.2 pg (ref 26.0–34.0)
MCHC: 32.8 g/dL (ref 30.0–36.0)
MCV: 86 fL (ref 80.0–100.0)
Monocytes Absolute: 0.5 10*3/uL (ref 0.1–1.0)
Monocytes Relative: 8 %
Neutro Abs: 4.6 10*3/uL (ref 1.7–7.7)
Neutrophils Relative %: 68 %
Platelets: 203 10*3/uL (ref 150–400)
RBC: 4.43 MIL/uL (ref 3.87–5.11)
RDW: 13.2 % (ref 11.5–15.5)
WBC: 6.8 10*3/uL (ref 4.0–10.5)
nRBC: 0 % (ref 0.0–0.2)

## 2019-05-28 LAB — COMPREHENSIVE METABOLIC PANEL
ALT: 19 U/L (ref 0–44)
AST: 17 U/L (ref 15–41)
Albumin: 3.7 g/dL (ref 3.5–5.0)
Alkaline Phosphatase: 95 U/L (ref 38–126)
Anion gap: 12 (ref 5–15)
BUN: 20 mg/dL (ref 8–23)
CO2: 24 mmol/L (ref 22–32)
Calcium: 9.1 mg/dL (ref 8.9–10.3)
Chloride: 105 mmol/L (ref 98–111)
Creatinine, Ser: 0.85 mg/dL (ref 0.44–1.00)
GFR calc Af Amer: >60 mL/min (ref >60)
GFR calc non Af Amer: >60 mL/min (ref >60)
Glucose, Bld: 118 mg/dL — ABNORMAL HIGH (ref 70–99)
Potassium: 3.7 mmol/L (ref 3.5–5.1)
Sodium: 141 mmol/L (ref 135–145)
Total Bilirubin: 0.3 mg/dL (ref 0.3–1.2)
Total Protein: 6.7 g/dL (ref 6.5–8.1)

## 2019-05-28 MED ORDER — TRASTUZUMAB-ANNS CHEMO 150 MG IV SOLR
600.0000 mg | Freq: Once | INTRAVENOUS | Status: AC
  Administered 2019-05-28: 600 mg via INTRAVENOUS
  Filled 2019-05-28: qty 28.57

## 2019-05-28 MED ORDER — HEPARIN SOD (PORK) LOCK FLUSH 100 UNIT/ML IV SOLN
500.0000 [IU] | Freq: Once | INTRAVENOUS | Status: AC | PRN
  Administered 2019-05-28: 500 [IU]
  Filled 2019-05-28: qty 5

## 2019-05-28 MED ORDER — SODIUM CHLORIDE 0.9% FLUSH
10.0000 mL | INTRAVENOUS | Status: DC | PRN
  Administered 2019-05-28: 10 mL
  Filled 2019-05-28: qty 10

## 2019-05-28 MED ORDER — DIPHENHYDRAMINE HCL 25 MG PO CAPS
ORAL_CAPSULE | ORAL | Status: AC
  Filled 2019-05-28: qty 1

## 2019-05-28 MED ORDER — SODIUM CHLORIDE 0.9 % IV SOLN
Freq: Once | INTRAVENOUS | Status: AC
  Administered 2019-05-28: 10:00:00 via INTRAVENOUS
  Filled 2019-05-28: qty 250

## 2019-05-28 MED ORDER — DIPHENHYDRAMINE HCL 25 MG PO CAPS
25.0000 mg | ORAL_CAPSULE | Freq: Once | ORAL | Status: AC
  Administered 2019-05-28: 10:00:00 25 mg via ORAL

## 2019-05-28 MED ORDER — ACETAMINOPHEN 325 MG PO TABS
650.0000 mg | ORAL_TABLET | Freq: Once | ORAL | Status: AC
  Administered 2019-05-28: 650 mg via ORAL

## 2019-05-28 MED ORDER — ACETAMINOPHEN 325 MG PO TABS
ORAL_TABLET | ORAL | Status: AC
  Filled 2019-05-28: qty 2

## 2019-05-28 NOTE — Telephone Encounter (Signed)
OK per MD to proceed with herceptin today with noted last Echo 02/08/2019.  ECHO ordered to be done prior to next herceptin treatment.

## 2019-05-28 NOTE — Telephone Encounter (Signed)
Scheduled appt per 8/21 sch message - unable to reach pt left message with appt date and time

## 2019-05-28 NOTE — Patient Instructions (Signed)

## 2019-06-01 ENCOUNTER — Ambulatory Visit (HOSPITAL_COMMUNITY): Payer: BC Managed Care – PPO

## 2019-06-01 ENCOUNTER — Ambulatory Visit (HOSPITAL_COMMUNITY)
Admission: RE | Admit: 2019-06-01 | Discharge: 2019-06-01 | Disposition: A | Discharge status: Home / Self Care | Payer: BC Managed Care – PPO | Source: Ambulatory Visit | Attending: Oncology | Admitting: Oncology

## 2019-06-02 NOTE — Telephone Encounter (Signed)
Patient seen 05/12/2019.

## 2019-06-04 ENCOUNTER — Ambulatory Visit (HOSPITAL_COMMUNITY)
Admission: RE | Admit: 2019-06-04 | Discharge: 2019-06-04 | Disposition: A | Discharge status: Home / Self Care | Payer: BC Managed Care – PPO | Source: Ambulatory Visit | Attending: Oncology | Admitting: Oncology

## 2019-06-04 ENCOUNTER — Other Ambulatory Visit: Payer: Self-pay

## 2019-06-04 DIAGNOSIS — Z170 Estrogen receptor positive status [ER+]: Secondary | ICD-10-CM | POA: Insufficient documentation

## 2019-06-04 DIAGNOSIS — Z5111 Encounter for antineoplastic chemotherapy: Secondary | ICD-10-CM

## 2019-06-04 DIAGNOSIS — I10 Essential (primary) hypertension: Secondary | ICD-10-CM | POA: Diagnosis not present

## 2019-06-04 DIAGNOSIS — C50212 Malignant neoplasm of upper-inner quadrant of left female breast: Secondary | ICD-10-CM | POA: Diagnosis not present

## 2019-06-04 DIAGNOSIS — E785 Hyperlipidemia, unspecified: Secondary | ICD-10-CM | POA: Diagnosis not present

## 2019-06-04 NOTE — Progress Notes (Signed)
*  PRELIMINARY RESULTS* Echocardiogram 2D Echocardiogram has been performed.  Tiffany Montes 06/04/2019, 10:05 AM

## 2019-06-11 ENCOUNTER — Telehealth: Payer: Self-pay | Admitting: Family Medicine

## 2019-06-11 NOTE — Telephone Encounter (Signed)
Patient called in stating she is wanting the amphetamine-dextroamphetamine (ADDERALL) 20 MG tablet medication to be increasing to a higher dosage. Patient states it is no where near enough of what she is needing. Please advise. Call back is [phone].

## 2019-06-11 NOTE — Telephone Encounter (Signed)
See note

## 2019-06-11 NOTE — Telephone Encounter (Signed)
Please see message. °

## 2019-06-14 NOTE — Telephone Encounter (Signed)
The reason we switched her was because of insomnia.  We can increase but may have insomnia again- it is a definite trade off.  Set up Doxy one day this week to discuss.

## 2019-06-15 ENCOUNTER — Telehealth (INDEPENDENT_AMBULATORY_CARE_PROVIDER_SITE_OTHER): Payer: BC Managed Care – PPO | Admitting: Family Medicine

## 2019-06-15 ENCOUNTER — Other Ambulatory Visit: Payer: Self-pay

## 2019-06-15 ENCOUNTER — Encounter: Payer: Self-pay | Admitting: Family Medicine

## 2019-06-15 DIAGNOSIS — F988 Other specified behavioral and emotional disorders with onset usually occurring in childhood and adolescence: Secondary | ICD-10-CM | POA: Diagnosis not present

## 2019-06-15 MED ORDER — AMPHETAMINE-DEXTROAMPHETAMINE 30 MG PO TABS: 30.0000 mg | ORAL_TABLET | Freq: Every day | ORAL | 0 refills | Status: DC

## 2019-06-15 NOTE — Telephone Encounter (Signed)
Called patient and set up a 4:15pm Doxy visit for today to go over. Patient verbalized an understanding.

## 2019-06-15 NOTE — Progress Notes (Signed)
This visit type was conducted due to national recommendations for restrictions regarding the COVID-19 pandemic in an effort to limit this patient's exposure and mitigate transmission in our community.   Virtual Visit via Video Note  I connected with Tiffany Montes on 06/15/19 at  4:15 PM EDT by a video enabled telemedicine application and verified that I am speaking with the correct person using two identifiers.  Location patient: home Location provider:work or home office Persons participating in the virtual visit: patient, provider  I discussed the limitations of evaluation and management by telemedicine and the availability of in person appointments. The patient expressed understanding and agreed to proceed.   HPI:  Patient called earlier with questions regarding Adderall.  She has been on Adderall extended release 30 mg once daily but was having insomnia issues.  We switched to Adderall 20 mg immediate release with her goal of taking this once daily.  She called back stating that she did not feel this was as effective.  Her insomnia has improved though since getting rid of extended release.  She also has much improved blood pressure since raising her losartan 100 mg.  Blood pressures been well controlled recently.  Denies any recent headaches, chest pain, dyspnea.  Appetite and weight are stable   ROS: See pertinent positives and negatives per HPI.  Past Medical History:  Diagnosis Date  . Anxiety   . Attention deficit disorder   . Breast cancer ([place])   . Depression   . Gallstones 09/05/2011  . Hyperlipidemia   . Hypertension   . Hypothyroidism   . Migraines   . PONV (postoperative nausea and vomiting)    diffficulty waking up  . Thyroid disease    hypothyroid    Past Surgical History:  Procedure Laterality Date  . ABDOMINAL HYSTERECTOMY  2003  . BREAST BIOPSY Left 2014   benign  . BREAST LUMPECTOMY WITH RADIOACTIVE SEED AND SENTINEL LYMPH NODE BIOPSY Left 09/07/2018   Procedure: LEFT BREAST LUMPECTOMY WITH RADIOACTIVE SEED AND AXILLARY SENTINEL LYMPH NODE BIOPSY,INJECT BLUE DYE LEFT BREAST;  Surgeon: Fanny Skates, MD;  Location: [facility];  Service: General;  Laterality: Left;  . CHOLECYSTECTOMY  09/26/2011   Procedure: LAPAROSCOPIC CHOLECYSTECTOMY WITH INTRAOPERATIVE CHOLANGIOGRAM;  Surgeon: Haywood Lasso, MD;  Location: [place];  Service: General;  Laterality: N/A;  . COLONOSCOPY    . CYSTIC HYGROMA EXCISION    . DENTAL SURGERY     skin graft from top of mouth to lower gums  . PORTACATH PLACEMENT N/A 09/07/2018   Procedure: INSERTION PORT-A-CATH WITH [place];  Surgeon: Fanny Skates, MD;  Location: [facility];  Service: General;  Laterality: N/A;    Family History  Problem Relation Age of Onset  . Diabetes Father   . Heart disease Father   . Lung cancer Father   . Breast cancer Neg Hx   . Ovarian cancer Neg Hx     SOCIAL HX: Non-smoker   Current Outpatient Medications:  .  amphetamine-dextroamphetamine (ADDERALL) 30 MG tablet, Take 1 tablet by mouth daily., Disp: 30 tablet, Rfl: 0 .  anastrozole (ARIMIDEX) 1 MG tablet, Take 1 tablet (1 mg total) by mouth daily., Disp: 90 tablet, Rfl: 4 .  augmented betamethasone dipropionate (DIPROLENE-AF) 0.05 % cream, Apply 1 application topically 2 (two) times daily as needed (for eczema)., Disp: 30 g, Rfl: 2 .  BIOTIN PO, Take 1 tablet by mouth daily., Disp: , Rfl:  .  buPROPion (WELLBUTRIN XL) 300 MG 24 hr tablet, TAKE 1 TABLET  BY MOUTH EVERY DAY, Disp: 90 tablet, Rfl: 1 .  Cholecalciferol (VITAMIN D3 PO), Take 1 tablet by mouth daily., Disp: , Rfl:  .  clonazePAM (KLONOPIN) 0.5 MG tablet, TAKE 1 TABLET BY MOUTH EVERY DAY AT BEDTIME AS NEEDED, Disp: 30 tablet, Rfl: 5 .  fluticasone (FLONASE) 50 MCG/ACT nasal spray, SPRAY 2 SPRAYS IN EACH NOSTRIL DAILY AS NEEDED FOR ALLERGIES, Disp: 48 mL, Rfl: 0 .  gabapentin (NEURONTIN) 300 MG capsule, Take 1 capsule (300 mg total) by mouth at bedtime., Disp: 90 capsule, Rfl: 4 .   levothyroxine (SYNTHROID) 125 MCG tablet, TAKE 1 TABLET BY MOUTH EVERY DAY, Disp: 90 tablet, Rfl: 1 .  losartan-hydrochlorothiazide (HYZAAR) 100-12.5 MG tablet, Take 1 tablet by mouth daily., Disp: 90 tablet, Rfl: 3 .  Multiple Vitamins-Minerals (MULTIVITAMINS THER. W/MINERALS) TABS, Take 1 tablet by mouth daily.  , Disp: , Rfl:  .  rosuvastatin (CRESTOR) 20 MG tablet, Take 1 tablet (20 mg total) by mouth daily., Disp: 90 tablet, Rfl: 0 .  sertraline (ZOLOFT) 100 MG tablet, Take 1 tablet (100 mg total) by mouth daily., Disp: 90 tablet, Rfl: 3 .  SUMAtriptan (IMITREX) 100 MG tablet, TAKE 1 TABLET BY MOUTH DAILY AS NEEDED FOR MIGRAINE. MAY REPEAT IN 24 HOURS., Disp: 9 tablet, Rfl: 2  EXAM:  VITALS per patient if applicable:  GENERAL: alert, oriented, appears well and in no acute distress  HEENT: atraumatic, conjunttiva clear, no obvious abnormalities on inspection of external nose and ears  NECK: normal movements of the head and neck  LUNGS: on inspection no signs of respiratory distress, breathing rate appears normal, no obvious gross SOB, gasping or wheezing  CV: no obvious cyanosis  MS: moves all visible extremities without noticeable abnormality  PSYCH/NEURO: pleasant and cooperative, no obvious depression or anxiety, speech and thought processing grossly intact  ASSESSMENT AND PLAN:  Discussed the following assessment and plan:  Attention deficit disorder.  Patient had recent change from extended release to immediate release and feels she has had suboptimal control  -We agreed to changing her prescription to Adderall immediate release 30 mg once daily -She will give some feedback in a month regarding how this is working -Continue to monitor blood pressure closely    I discussed the assessment and treatment plan with the patient. The patient was provided an opportunity to ask questions and all were answered. The patient agreed with the plan and demonstrated an understanding of  the instructions.   The patient was advised to call back or seek an in-person evaluation if the symptoms worsen or if the condition fails to improve as anticipated.   Carolann Littler, MD

## 2019-06-16 NOTE — Progress Notes (Signed)
[place]  Telephone:([phone]) [phone] [phone]    ID: Tiffany Montes DOB: 01-Jan-1956  MR#: [id number]  WLK#:[id number]  Patient Care Team: Eulas Post, MD as PCP - General Fanny Skates, MD as Consulting Physician (General Surgery) Magrinat, Virgie Dad, MD as Consulting Physician (Oncology) Kyung Rudd, MD as Consulting Physician (Radiation Oncology) Maisie Fus, MD as Consulting Physician (Obstetrics and Gynecology) Larey Dresser, MD as Consulting Physician (Cardiology) OTHER MD:    CHIEF COMPLAINT: Estrogen receptor positive breast cancer, HER-2 amplified  CURRENT TREATMENT: Adjuvant trastuzumab, anastrozole   INTERVAL HISTORY: Tiffany Montes returns today for follow-up and treatment of her estrogen receptor positive and HER2 amplified breast cancer.  She has been started on anastrozole and is tolerating that with no significant issues.  She does have some hot flashes.  Vaginal dryness is not a major problem  She continues on trastuzumab given on day 1 of a 21 day cycle, with a treatment due today.  She has had no issues with these treatments.  [name]'s most recent echocardiogram on 06/04/2019 showed an ejection fraction in the 60% - 65% range.   Since her last visit, she completed radiation treatment on 03/04/2019 under Dr. Lisbeth Renshaw.    She also underwent colonoscopy on 05/13/2019 under Dr. Earlean Shawl. One polyp was biopsied, and pathology ([id number]) showed tubular adenoma. She is recommended to undergo repeat exam in 5 years.  She will be due for repeat mammography in October.  REVIEW OF SYSTEMS: Tiffany Montes complains of numbness and a little bit of weakness in the first 3 digits of the right hand.  She also has neck symptoms.  It may be an impingement issue or a carpal tunnel issue.  It is not clear.  Currently she is not working because of concerns regarding the pandemic and the fact that she tells me at [employer] they have had several workers who have tested  positive.  She is afraid also of contaminating her 64 year old mother, who lives with the patient's sister.  For exercise [name] is doing some walking and some yard work.  A detailed review of systems today was otherwise noncontributory   HISTORY OF CURRENT ILLNESS: From the original intake note:  "Tiffany Montes" had routine screening mammography on 07/22/2018 showing a possible abnormality in the left breast. She underwent unilateral diagnostic mammography with tomography and left breast ultrasonography at [facility] on 07/28/2018 showing: Breast density Category C; a 7 x 8 x 8 mm hypoechoic mass in the left breast lower inner quadrant, consistent with a complex cyst.. No abnormal lymph nodes in the left axilla.  Accordingly on 07/31/2018 she proceeded to biopsy of the left breast area in question. The pathology from this procedure showed ([id number]): Invasive Ductal Carcinoma, Grade 3. Prognostic indicators significant for: estrogen receptor, 95% positive and progesterone receptor, 85% positive, both with strong staining intensity. Proliferation marker Ki67 at 75%. HER2 positive by immunohistochemistry, 3+.   The patient's subsequent history is as detailed below.   PAST MEDICAL HISTORY: Past Medical History:  Diagnosis Date  . Anxiety   . Attention deficit disorder   . Breast cancer ([place])   . Depression   . Gallstones 09/05/2011  . Hyperlipidemia   . Hypertension   . Hypothyroidism   . Migraines   . PONV (postoperative nausea and vomiting)    diffficulty waking up  . Thyroid disease    hypothyroid    PAST SURGICAL HISTORY: Past Surgical History:  Procedure Laterality Date  . ABDOMINAL HYSTERECTOMY  2003  .  BREAST BIOPSY Left 2014   benign  . BREAST LUMPECTOMY WITH RADIOACTIVE SEED AND SENTINEL LYMPH NODE BIOPSY Left 09/07/2018   Procedure: LEFT BREAST LUMPECTOMY WITH RADIOACTIVE SEED AND AXILLARY SENTINEL LYMPH NODE BIOPSY,INJECT BLUE DYE LEFT BREAST;  Surgeon: Fanny Skates,  MD;  Location: [facility];  Service: General;  Laterality: Left;  . CHOLECYSTECTOMY  09/26/2011   Procedure: LAPAROSCOPIC CHOLECYSTECTOMY WITH INTRAOPERATIVE CHOLANGIOGRAM;  Surgeon: Haywood Lasso, MD;  Location: [facility];  Service: General;  Laterality: N/A;  . COLONOSCOPY    . CYSTIC HYGROMA EXCISION    . DENTAL SURGERY     skin graft from top of mouth to lower gums  . PORTACATH PLACEMENT N/A 09/07/2018   Procedure: INSERTION PORT-A-CATH WITH [place];  Surgeon: Fanny Skates, MD;  Location: [facility];  Service: General;  Laterality: N/A;    FAMILY HISTORY: Family History  Problem Relation Age of Onset  . Diabetes Father   . Heart disease Father   . Lung cancer Father   . Breast cancer Neg Hx   . Ovarian cancer Neg Hx    She notes that her father died from CHF at age 59.  He was diagnosed with lung cancer at age 44. Patients' mother is 64 years old as of November 2019. The patient has 1 brother and 2 sisters. Patient denies a family history of ovarian or breast cancer.   GYNECOLOGIC HISTORY:  No LMP recorded. Patient has had a hysterectomy. 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 Job is very stressful and schedule is  She is divorced and lives by herself with her 2 dogs. Her daughter [name] is a Dealer in [place], [name].  The patient's son, [name] is a Training and development officer and lives in [place]. The patient has 2 grandchildren and 1 great-grand child.  She is not a church attender  ADVANCED DIRECTIVES: Her [place] is her daughter, [name], [phone].     HEALTH MAINTENANCE: Social History   Tobacco Use  . Smoking status: Never Smoker  . Smokeless tobacco: Never Used  Substance Use Topics  . Alcohol use:  Yes    Comment: 1-2 a week and reports not every week  . Drug use: No    Colonoscopy: 05/2019, Dr. Earlean Shawl, 1 adenoma  PAP: 1 month ago  Bone density: Yes, 2 years ago   Allergies  Allergen Reactions  . Morphine Sulfate Nausea And Vomiting and Rash    GI upset    Current Outpatient Medications  Medication Sig Dispense Refill  . amphetamine-dextroamphetamine (ADDERALL) 30 MG tablet Take 1 tablet by mouth daily. 30 tablet 0  . anastrozole (ARIMIDEX) 1 MG tablet Take 1 tablet (1 mg total) by mouth daily. 90 tablet 4  . augmented betamethasone dipropionate (DIPROLENE-AF) 0.05 % cream Apply 1 application topically 2 (two) times daily as needed (for eczema). 30 g 2  . BIOTIN PO Take 1 tablet by mouth daily.    Marland Kitchen buPROPion (WELLBUTRIN XL) 300 MG 24 hr tablet TAKE 1 TABLET BY MOUTH EVERY DAY 90 tablet 1  . Cholecalciferol (VITAMIN D3 PO) Take 1 tablet by mouth daily.    . clonazePAM (KLONOPIN) 0.5 MG tablet TAKE 1 TABLET BY MOUTH EVERY DAY AT BEDTIME AS NEEDED 30  tablet 5  . diazepam (VALIUM) 5 MG tablet Take 1 tablet (5 mg total) by mouth every 6 (six) hours as needed for anxiety. 5 tablet 0  . fluticasone (FLONASE) 50 MCG/ACT nasal spray SPRAY 2 SPRAYS IN EACH NOSTRIL DAILY AS NEEDED FOR ALLERGIES 48 mL 0  . gabapentin (NEURONTIN) 300 MG capsule Take 1 capsule (300 mg total) by mouth at bedtime. 90 capsule 4  . levothyroxine (SYNTHROID) 125 MCG tablet TAKE 1 TABLET BY MOUTH EVERY DAY 90 tablet 1  . losartan-hydrochlorothiazide (HYZAAR) 100-12.5 MG tablet Take 1 tablet by mouth daily. 90 tablet 3  . Multiple Vitamins-Minerals (MULTIVITAMINS THER. W/MINERALS) TABS Take 1 tablet by mouth daily.      . rosuvastatin (CRESTOR) 20 MG tablet Take 1 tablet (20 mg total) by mouth daily. 90 tablet 0  . sertraline (ZOLOFT) 100 MG tablet Take 1 tablet (100 mg total) by mouth daily. 90 tablet 3  . SUMAtriptan (IMITREX) 100 MG tablet TAKE 1 TABLET BY MOUTH DAILY AS NEEDED FOR MIGRAINE. MAY REPEAT IN 24  HOURS. 9 tablet 2   No current facility-administered medications for this visit.     OBJECTIVE: Middle-aged white woman in no acute distress  Vitals:   06/17/19 1046  BP: 138/86  Pulse: 67  Resp: 18  Temp: 98.3 F (36.8 C)  SpO2: 99%     Body mass index is 38.59 kg/m.   Wt Readings from Last 3 Encounters:  06/17/19 212 lb 11.2 oz (96.5 kg)  05/28/19 211 lb 8 oz (95.9 kg)  04/19/19 212 lb 11.2 oz (96.5 kg)  ECOG FS:1  Sclerae unicteric, EOMs intact Wearing a mask No cervical or supraclavicular adenopathy Lungs no rales or rhonchi Heart regular rate and rhythm Abd soft, nontender, positive bowel sounds MSK no focal spinal tenderness, no upper extremity lymphedema Neuro: nonfocal, well oriented, appropriate affect Breasts: The right breast is benign.  The left breast has undergone lumpectomy followed by radiation.  There is no evidence of disease recurrence.  Both axillae are benign.  LAB RESULTS:  CMP     Component Value Date/Time   NA 141 06/17/2019 1020   K 3.9 06/17/2019 1020   CL 106 06/17/2019 1020   CO2 27 06/17/2019 1020   GLUCOSE 109 (H) 06/17/2019 1020   BUN 16 06/17/2019 1020   CREATININE 0.93 06/17/2019 1020   CREATININE 0.86 01/22/2019 0915   CALCIUM 9.1 06/17/2019 1020   PROT 6.8 06/17/2019 1020   ALBUMIN 3.9 06/17/2019 1020   AST 16 06/17/2019 1020   AST 20 01/22/2019 0915   ALT 15 06/17/2019 1020   ALT 22 01/22/2019 0915   ALKPHOS 89 06/17/2019 1020   BILITOT 0.3 06/17/2019 1020   BILITOT 0.3 01/22/2019 0915   GFRNONAA >60 06/17/2019 1020   GFRNONAA >60 01/22/2019 0915   GFRAA >60 06/17/2019 1020   GFRAA >60 01/22/2019 0915    No results found for: TOTALPROTELP, ALBUMINELP, A1GS, A2GS, BETS, BETA2SER, GAMS, MSPIKE, SPEI  No results found for: KPAFRELGTCHN, LAMBDASER, KAPLAMBRATIO  Lab Results  Component Value Date   WBC 5.8 06/17/2019   NEUTROABS 3.9 06/17/2019   HGB 12.9 06/17/2019   HCT 40.0 06/17/2019   MCV 88.1 06/17/2019   PLT  204 06/17/2019    @LASTCHEMISTRY @  No results found for: LABCA2  No components found for: [id number]  No results for input(s): INR in the last 168 hours.  No results found for: LABCA2  No results found for: WUJ811  No results found for: BJY782  No results found for: OVZ858  No results found for: CA2729  No components found for: HGQUANT  No results found for: CEA1 / No results found for: CEA1   No results found for: AFPTUMOR  No results found for: CHROMOGRNA  No results found for: PSA1  Appointment on 06/17/2019  Component Date Value Ref Range Status  . Sodium 06/17/2019 141  135 - 145 mmol/L Final  . Potassium 06/17/2019 3.9  3.5 - 5.1 mmol/L Final  . Chloride 06/17/2019 106  98 - 111 mmol/L Final  . CO2 06/17/2019 27  22 - 32 mmol/L Final  . Glucose, Bld 06/17/2019 109* 70 - 99 mg/dL Final  . BUN 06/17/2019 16  8 - 23 mg/dL Final  . Creatinine, Ser 06/17/2019 0.93  0.44 - 1.00 mg/dL Final  . Calcium 06/17/2019 9.1  8.9 - 10.3 mg/dL Final  . Total Protein 06/17/2019 6.8  6.5 - 8.1 g/dL Final  . Albumin 06/17/2019 3.9  3.5 - 5.0 g/dL Final  . AST 06/17/2019 16  15 - 41 U/L Final  . ALT 06/17/2019 15  0 - 44 U/L Final  . Alkaline Phosphatase 06/17/2019 89  38 - 126 U/L Final  . Total Bilirubin 06/17/2019 0.3  0.3 - 1.2 mg/dL Final  . GFR calc non Af Amer 06/17/2019 >60  >60 mL/min Final  . GFR calc Af Amer 06/17/2019 >60  >60 mL/min Final  . Anion gap 06/17/2019 8  5 - 15 Final   Performed at [facility], [place] [address] [place], [place] [address]  . WBC 06/17/2019 5.8  4.0 - 10.5 K/uL Final  . RBC 06/17/2019 4.54  3.87 - 5.11 MIL/uL Final  . Hemoglobin 06/17/2019 12.9  12.0 - 15.0 g/dL Final  . HCT 06/17/2019 40.0  36.0 - 46.0 % Final  . MCV 06/17/2019 88.1  80.0 - 100.0 fL Final  . MCH 06/17/2019 28.4  26.0 - 34.0 pg Final  . MCHC 06/17/2019 32.3  30.0 - 36.0 g/dL Final  . RDW 06/17/2019 13.3  11.5 - 15.5 % Final  . Platelets  06/17/2019 204  150 - 400 K/uL Final  . nRBC 06/17/2019 0.0  0.0 - 0.2 % Final  . Neutrophils Relative % 06/17/2019 68  % Final  . Neutro Abs 06/17/2019 3.9  1.7 - 7.7 K/uL Final  . Lymphocytes Relative 06/17/2019 18  % Final  . Lymphs Abs 06/17/2019 1.1  0.7 - 4.0 K/uL Final  . Monocytes Relative 06/17/2019 8  % Final  . Monocytes Absolute 06/17/2019 0.5  0.1 - 1.0 K/uL Final  . Eosinophils Relative 06/17/2019 5  % Final  . Eosinophils Absolute 06/17/2019 0.3  0.0 - 0.5 K/uL Final  . Basophils Relative 06/17/2019 1  % Final  . Basophils Absolute 06/17/2019 0.0  0.0 - 0.1 K/uL Final  . Immature Granulocytes 06/17/2019 0  % Final  . Abs Immature Granulocytes 06/17/2019 0.02  0.00 - 0.07 K/uL Final   Performed at [facility], [place] [address], [place], [place] [address]    (this displays the last labs from the last 3 days)  No results found for: TOTALPROTELP, ALBUMINELP, A1GS, A2GS, BETS, BETA2SER, GAMS, MSPIKE, SPEI (this displays SPEP labs)  No results found for: KPAFRELGTCHN, LAMBDASER, KAPLAMBRATIO (kappa/lambda light chains)  No results found for: HGBA, HGBA2QUANT, HGBFQUANT, HGBSQUAN (Hemoglobinopathy evaluation)   No results found for: LDH  No results found for: IRON, TIBC, IRONPCTSAT (Iron and TIBC)  No results found for:  FERRITIN  Urinalysis    Component Value Date/Time   COLORURINE YELLOW 09/13/2011 0952   APPEARANCEUR CLEAR 09/13/2011 0952   LABSPEC 1.010 09/13/2011 0952   PHURINE 6.5 09/13/2011 0952   GLUCOSEU NEGATIVE 09/13/2011 0952   HGBUR NEGATIVE 09/13/2011 0952   HGBUR negative 04/03/2009 0949   BILIRUBINUR n 09/15/2012 0832   KETONESUR NEGATIVE 09/13/2011 0952   PROTEINUR n 09/15/2012 0832   PROTEINUR NEGATIVE 09/13/2011 0952   UROBILINOGEN 0.2 09/15/2012 0832   UROBILINOGEN 0.2 09/13/2011 0952   NITRITE n 09/15/2012 0832   NITRITE NEGATIVE 09/13/2011 0952   LEUKOCYTESUR Negative 09/15/2012 0832     STUDIES:  No results  found.   ELIGIBLE FOR AVAILABLE RESEARCH PROTOCOL: no   ASSESSMENT: 63 y.o. [place] woman status post left breast lower inner quadrant biopsy 07/31/2018 for a clinical T1b N0, stage IA invasive ductal carcinoma, grade 3, triple positive, with an MIB-1 of 75%.  (1) status post left lumpectomy and sentinel lymph node sampling 09/07/2018 for a pT1c pN0, stage IA invasive ductal carcinoma, grade 3, with negative margins.  (a) a total of 5 sentinel lymph nodes were removed  (2) adjuvant chemotherapy consisting of paclitaxel weekly x12 with trastuzumab every 21 days starting 10/02/2018, completed 12/25/2018  (3) trastuzumab to be continued to complete 1 year (through December 2020).  (a) echocardiogram 08/17/2018 shows an ejection fraction of 60-65%  (b) echocardiogram on 11/17/2018 shows an ejection fraction of 60-65%  (c) echo 02/08/2019 shows an ejection fraction in the 60-65%  (4) adjuvant radiation 02/04/2019 - 03/04/2019  (a) Left breast treated to 42.56 Gy in 16 fractions  (b) Seroma boost of 8 Gy in 4 fractions, for a total of 50.56 Gy  (5) started anastrozole 04/11/2019  (a) bone density scan scheduled for 08/10/2019   PLAN:  Tiffany Montes is tolerating the anastrozole well and the plan will be to continue that a total of 5 years.  Her echo continues to be excellent and she will receive Herceptin today and through early December.  We will repeat an echocardiogram after her last dose.  She will be due for her next mammogram in October.  I think she has some radial nerve issues.  I do not know if they are due to carpal tunnel or due to nerve impingement at the neck.  She does have some neck symptoms as well.  I am putting her in for an MRI of the neck in the next month or so and I am also referring her to physical therapy, which I think will be helpful.  I have encouraged her to walk 40 to 50 minutes daily at least 5 days a week.  She is taking appropriate pandemic precautions  She  knows to call for any other issues that may develop before the next visit.   Virgie Dad. Magrinat, MD  06/17/19 11:19 AM Medical Oncology and Hematology [facility] [address] [place], [place] [address] Tel. [phone]    Fax. [phone]    I, Wilburn Mylar, am acting as scribe for Dr. Virgie Dad. Magrinat.  I, Lurline Del MD, have reviewed the above documentation for accuracy and completeness, and I agree with the above.

## 2019-06-17 ENCOUNTER — Other Ambulatory Visit: Payer: Self-pay

## 2019-06-17 ENCOUNTER — Inpatient Hospital Stay: Payer: BC Managed Care – PPO

## 2019-06-17 ENCOUNTER — Inpatient Hospital Stay (HOSPITAL_BASED_OUTPATIENT_CLINIC_OR_DEPARTMENT_OTHER): Payer: BC Managed Care – PPO | Admitting: Oncology

## 2019-06-17 ENCOUNTER — Inpatient Hospital Stay: Payer: BC Managed Care – PPO | Attending: Oncology

## 2019-06-17 VITALS — BP 138/86 | HR 67 | Temp 98.3°F | Resp 18 | Ht 62.25 in | Wt 212.7 lb

## 2019-06-17 DIAGNOSIS — C50212 Malignant neoplasm of upper-inner quadrant of left female breast: Secondary | ICD-10-CM

## 2019-06-17 DIAGNOSIS — Z79811 Long term (current) use of aromatase inhibitors: Secondary | ICD-10-CM | POA: Diagnosis not present

## 2019-06-17 DIAGNOSIS — Z95828 Presence of other vascular implants and grafts: Secondary | ICD-10-CM

## 2019-06-17 DIAGNOSIS — E039 Hypothyroidism, unspecified: Secondary | ICD-10-CM | POA: Diagnosis not present

## 2019-06-17 DIAGNOSIS — Z170 Estrogen receptor positive status [ER+]: Secondary | ICD-10-CM | POA: Insufficient documentation

## 2019-06-17 DIAGNOSIS — Z5112 Encounter for antineoplastic immunotherapy: Secondary | ICD-10-CM | POA: Insufficient documentation

## 2019-06-17 DIAGNOSIS — I10 Essential (primary) hypertension: Secondary | ICD-10-CM | POA: Insufficient documentation

## 2019-06-17 DIAGNOSIS — C50312 Malignant neoplasm of lower-inner quadrant of left female breast: Secondary | ICD-10-CM | POA: Diagnosis not present

## 2019-06-17 DIAGNOSIS — E785 Hyperlipidemia, unspecified: Secondary | ICD-10-CM | POA: Diagnosis not present

## 2019-06-17 DIAGNOSIS — F418 Other specified anxiety disorders: Secondary | ICD-10-CM | POA: Diagnosis not present

## 2019-06-17 DIAGNOSIS — Z79899 Other long term (current) drug therapy: Secondary | ICD-10-CM | POA: Diagnosis not present

## 2019-06-17 LAB — CBC WITH DIFFERENTIAL/PLATELET
Abs Immature Granulocytes: 0.02 10*3/uL (ref 0.00–0.07)
Basophils Absolute: 0 10*3/uL (ref 0.0–0.1)
Basophils Relative: 1 %
Eosinophils Absolute: 0.3 10*3/uL (ref 0.0–0.5)
Eosinophils Relative: 5 %
HCT: 40 % (ref 36.0–46.0)
Hemoglobin: 12.9 g/dL (ref 12.0–15.0)
Immature Granulocytes: 0 %
Lymphocytes Relative: 18 %
Lymphs Abs: 1.1 10*3/uL (ref 0.7–4.0)
MCH: 28.4 pg (ref 26.0–34.0)
MCHC: 32.3 g/dL (ref 30.0–36.0)
MCV: 88.1 fL (ref 80.0–100.0)
Monocytes Absolute: 0.5 10*3/uL (ref 0.1–1.0)
Monocytes Relative: 8 %
Neutro Abs: 3.9 10*3/uL (ref 1.7–7.7)
Neutrophils Relative %: 68 %
Platelets: 204 10*3/uL (ref 150–400)
RBC: 4.54 MIL/uL (ref 3.87–5.11)
RDW: 13.3 % (ref 11.5–15.5)
WBC: 5.8 10*3/uL (ref 4.0–10.5)
nRBC: 0 % (ref 0.0–0.2)

## 2019-06-17 LAB — COMPREHENSIVE METABOLIC PANEL
ALT: 15 U/L (ref 0–44)
AST: 16 U/L (ref 15–41)
Albumin: 3.9 g/dL (ref 3.5–5.0)
Alkaline Phosphatase: 89 U/L (ref 38–126)
Anion gap: 8 (ref 5–15)
BUN: 16 mg/dL (ref 8–23)
CO2: 27 mmol/L (ref 22–32)
Calcium: 9.1 mg/dL (ref 8.9–10.3)
Chloride: 106 mmol/L (ref 98–111)
Creatinine, Ser: 0.93 mg/dL (ref 0.44–1.00)
GFR calc Af Amer: >60 mL/min (ref >60)
GFR calc non Af Amer: >60 mL/min (ref >60)
Glucose, Bld: 109 mg/dL — ABNORMAL HIGH (ref 70–99)
Potassium: 3.9 mmol/L (ref 3.5–5.1)
Sodium: 141 mmol/L (ref 135–145)
Total Bilirubin: 0.3 mg/dL (ref 0.3–1.2)
Total Protein: 6.8 g/dL (ref 6.5–8.1)

## 2019-06-17 MED ORDER — TRASTUZUMAB-ANNS CHEMO 150 MG IV SOLR
600.0000 mg | Freq: Once | INTRAVENOUS | Status: AC
  Administered 2019-06-17: 600 mg via INTRAVENOUS
  Filled 2019-06-17: qty 28.57

## 2019-06-17 MED ORDER — DIPHENHYDRAMINE HCL 25 MG PO CAPS
25.0000 mg | ORAL_CAPSULE | Freq: Once | ORAL | Status: AC
  Administered 2019-06-17: 25 mg via ORAL

## 2019-06-17 MED ORDER — SODIUM CHLORIDE 0.9 % IV SOLN
Freq: Once | INTRAVENOUS | Status: AC
  Administered 2019-06-17: 12:00:00 via INTRAVENOUS
  Filled 2019-06-17: qty 250

## 2019-06-17 MED ORDER — ACETAMINOPHEN 325 MG PO TABS
ORAL_TABLET | ORAL | Status: AC
  Filled 2019-06-17: qty 2

## 2019-06-17 MED ORDER — HEPARIN SOD (PORK) LOCK FLUSH 100 UNIT/ML IV SOLN
500.0000 [IU] | Freq: Once | INTRAVENOUS | Status: AC | PRN
  Administered 2019-06-17: 500 [IU]
  Filled 2019-06-17: qty 5

## 2019-06-17 MED ORDER — ACETAMINOPHEN 325 MG PO TABS
650.0000 mg | ORAL_TABLET | Freq: Once | ORAL | Status: AC
  Administered 2019-06-17: 650 mg via ORAL

## 2019-06-17 MED ORDER — SODIUM CHLORIDE 0.9% FLUSH
10.0000 mL | INTRAVENOUS | Status: DC | PRN
  Administered 2019-06-17: 13:00:00 10 mL
  Filled 2019-06-17: qty 10

## 2019-06-17 MED ORDER — DIAZEPAM 5 MG PO TABS: 5.0000 mg | ORAL_TABLET | Freq: Four times a day (QID) | ORAL | 0 refills | Status: DC | PRN

## 2019-06-17 MED ORDER — SODIUM CHLORIDE 0.9% FLUSH
10.0000 mL | INTRAVENOUS | Status: DC | PRN
  Administered 2019-06-17: 11:00:00 10 mL
  Filled 2019-06-17: qty 10

## 2019-06-17 MED ORDER — DIPHENHYDRAMINE HCL 25 MG PO CAPS
ORAL_CAPSULE | ORAL | Status: AC
  Filled 2019-06-17: qty 1

## 2019-06-17 NOTE — Patient Instructions (Signed)
[facility] Discharge Instructions for Patients Receiving Chemotherapy  Today you received the following chemotherapy agents: Kanjinti  To help prevent nausea and vomiting after your treatment, we encourage you to take your nausea medication as directed.    If you develop nausea and vomiting that is not controlled by your nausea medication, call the clinic.   BELOW ARE SYMPTOMS THAT SHOULD BE REPORTED IMMEDIATELY:  *FEVER GREATER THAN 100.5 F  *CHILLS WITH OR WITHOUT FEVER  NAUSEA AND VOMITING THAT IS NOT CONTROLLED WITH YOUR NAUSEA MEDICATION  *UNUSUAL SHORTNESS OF BREATH  *UNUSUAL BRUISING OR BLEEDING  TENDERNESS IN MOUTH AND THROAT WITH OR WITHOUT PRESENCE OF ULCERS  *URINARY PROBLEMS  *BOWEL PROBLEMS  UNUSUAL RASH Items with * indicate a potential emergency and should be followed up as soon as possible.  Feel free to call the clinic should you have any questions or concerns. The clinic phone number is [phone].  Please show the CHEMO ALERT CARD at check-in to the Emergency Department and triage nurse.   

## 2019-06-29 ENCOUNTER — Other Ambulatory Visit: Payer: Self-pay

## 2019-06-29 ENCOUNTER — Other Ambulatory Visit: Payer: Self-pay | Admitting: Family Medicine

## 2019-06-29 ENCOUNTER — Ambulatory Visit (HOSPITAL_COMMUNITY)
Admission: RE | Admit: 2019-06-29 | Discharge: 2019-06-29 | Disposition: A | Discharge status: Home / Self Care | Payer: BC Managed Care – PPO | Source: Ambulatory Visit | Attending: Oncology | Admitting: Oncology

## 2019-06-29 DIAGNOSIS — Z170 Estrogen receptor positive status [ER+]: Secondary | ICD-10-CM | POA: Diagnosis not present

## 2019-06-29 DIAGNOSIS — M5030 Other cervical disc degeneration, unspecified cervical region: Secondary | ICD-10-CM | POA: Diagnosis not present

## 2019-06-29 DIAGNOSIS — M5023 Other cervical disc displacement, cervicothoracic region: Secondary | ICD-10-CM | POA: Diagnosis not present

## 2019-06-29 DIAGNOSIS — M4803 Spinal stenosis, cervicothoracic region: Secondary | ICD-10-CM | POA: Diagnosis not present

## 2019-06-29 DIAGNOSIS — C50212 Malignant neoplasm of upper-inner quadrant of left female breast: Secondary | ICD-10-CM | POA: Insufficient documentation

## 2019-06-29 MED ORDER — GADOBUTROL 1 MMOL/ML IV SOLN
7.5000 mL | Freq: Once | INTRAVENOUS | Status: AC | PRN
  Administered 2019-06-29: 7.5 mL via INTRAVENOUS

## 2019-07-02 ENCOUNTER — Encounter: Payer: Self-pay | Admitting: Oncology

## 2019-07-02 ENCOUNTER — Other Ambulatory Visit: Payer: Self-pay | Admitting: Oncology

## 2019-07-08 ENCOUNTER — Inpatient Hospital Stay: Payer: BC Managed Care – PPO

## 2019-07-08 ENCOUNTER — Telehealth: Payer: Self-pay | Admitting: Adult Health

## 2019-07-08 NOTE — Telephone Encounter (Signed)
Scheduled appt per 10/1 sch message- pt is aware of appt date and time

## 2019-07-08 NOTE — Progress Notes (Deleted)
Pt. did not show up for appointment. Called and left voicemail message.

## 2019-07-09 ENCOUNTER — Inpatient Hospital Stay: Payer: BC Managed Care – PPO

## 2019-07-09 ENCOUNTER — Inpatient Hospital Stay: Payer: BC Managed Care – PPO | Attending: Oncology

## 2019-07-09 ENCOUNTER — Other Ambulatory Visit: Payer: Self-pay

## 2019-07-09 VITALS — BP 140/77 | HR 66 | Temp 98.5°F | Resp 18

## 2019-07-09 DIAGNOSIS — C50212 Malignant neoplasm of upper-inner quadrant of left female breast: Secondary | ICD-10-CM

## 2019-07-09 DIAGNOSIS — Z23 Encounter for immunization: Secondary | ICD-10-CM | POA: Insufficient documentation

## 2019-07-09 DIAGNOSIS — Z5111 Encounter for antineoplastic chemotherapy: Secondary | ICD-10-CM | POA: Diagnosis not present

## 2019-07-09 DIAGNOSIS — C50312 Malignant neoplasm of lower-inner quadrant of left female breast: Secondary | ICD-10-CM | POA: Insufficient documentation

## 2019-07-09 DIAGNOSIS — Z170 Estrogen receptor positive status [ER+]: Secondary | ICD-10-CM | POA: Insufficient documentation

## 2019-07-09 DIAGNOSIS — Z95828 Presence of other vascular implants and grafts: Secondary | ICD-10-CM

## 2019-07-09 LAB — COMPREHENSIVE METABOLIC PANEL
ALT: 18 U/L (ref 0–44)
AST: 16 U/L (ref 15–41)
Albumin: 3.8 g/dL (ref 3.5–5.0)
Alkaline Phosphatase: 96 U/L (ref 38–126)
Anion gap: 11 (ref 5–15)
BUN: 19 mg/dL (ref 8–23)
CO2: 26 mmol/L (ref 22–32)
Calcium: 9 mg/dL (ref 8.9–10.3)
Chloride: 105 mmol/L (ref 98–111)
Creatinine, Ser: 0.86 mg/dL (ref 0.44–1.00)
GFR calc Af Amer: >60 mL/min (ref >60)
GFR calc non Af Amer: >60 mL/min (ref >60)
Glucose, Bld: 113 mg/dL — ABNORMAL HIGH (ref 70–99)
Potassium: 3.9 mmol/L (ref 3.5–5.1)
Sodium: 142 mmol/L (ref 135–145)
Total Bilirubin: <0.2 mg/dL — ABNORMAL LOW (ref 0.3–1.2)
Total Protein: 6.5 g/dL (ref 6.5–8.1)

## 2019-07-09 LAB — CBC WITH DIFFERENTIAL/PLATELET
Abs Immature Granulocytes: 0.01 10*3/uL (ref 0.00–0.07)
Basophils Absolute: 0 10*3/uL (ref 0.0–0.1)
Basophils Relative: 1 %
Eosinophils Absolute: 0.3 10*3/uL (ref 0.0–0.5)
Eosinophils Relative: 4 %
HCT: 38.2 % (ref 36.0–46.0)
Hemoglobin: 12.5 g/dL (ref 12.0–15.0)
Immature Granulocytes: 0 %
Lymphocytes Relative: 19 %
Lymphs Abs: 1.1 10*3/uL (ref 0.7–4.0)
MCH: 28.9 pg (ref 26.0–34.0)
MCHC: 32.7 g/dL (ref 30.0–36.0)
MCV: 88.4 fL (ref 80.0–100.0)
Monocytes Absolute: 0.5 10*3/uL (ref 0.1–1.0)
Monocytes Relative: 8 %
Neutro Abs: 4 10*3/uL (ref 1.7–7.7)
Neutrophils Relative %: 68 %
Platelets: 209 10*3/uL (ref 150–400)
RBC: 4.32 MIL/uL (ref 3.87–5.11)
RDW: 13.2 % (ref 11.5–15.5)
WBC: 5.9 10*3/uL (ref 4.0–10.5)
nRBC: 0 % (ref 0.0–0.2)

## 2019-07-09 MED ORDER — SODIUM CHLORIDE 0.9% FLUSH
10.0000 mL | INTRAVENOUS | Status: DC | PRN
  Administered 2019-07-09: 10 mL
  Filled 2019-07-09: qty 10

## 2019-07-09 MED ORDER — SODIUM CHLORIDE 0.9 % IV SOLN
Freq: Once | INTRAVENOUS | Status: AC
  Administered 2019-07-09: 09:00:00 via INTRAVENOUS
  Filled 2019-07-09: qty 250

## 2019-07-09 MED ORDER — HEPARIN SOD (PORK) LOCK FLUSH 100 UNIT/ML IV SOLN
500.0000 [IU] | Freq: Once | INTRAVENOUS | Status: AC | PRN
  Administered 2019-07-09: 500 [IU]
  Filled 2019-07-09: qty 5

## 2019-07-09 MED ORDER — ACETAMINOPHEN 325 MG PO TABS
ORAL_TABLET | ORAL | Status: AC
  Filled 2019-07-09: qty 2

## 2019-07-09 MED ORDER — ACETAMINOPHEN 325 MG PO TABS
650.0000 mg | ORAL_TABLET | Freq: Once | ORAL | Status: AC
  Administered 2019-07-09: 09:00:00 650 mg via ORAL

## 2019-07-09 MED ORDER — DIPHENHYDRAMINE HCL 25 MG PO CAPS
ORAL_CAPSULE | ORAL | Status: AC
  Filled 2019-07-09: qty 1

## 2019-07-09 MED ORDER — TRASTUZUMAB-ANNS CHEMO 150 MG IV SOLR
600.0000 mg | Freq: Once | INTRAVENOUS | Status: AC
  Administered 2019-07-09: 600 mg via INTRAVENOUS
  Filled 2019-07-09: qty 28.57

## 2019-07-09 MED ORDER — DIPHENHYDRAMINE HCL 25 MG PO CAPS
25.0000 mg | ORAL_CAPSULE | Freq: Once | ORAL | Status: AC
  Administered 2019-07-09: 09:00:00 25 mg via ORAL

## 2019-07-09 NOTE — Patient Instructions (Signed)
[facility] Discharge Instructions for Patients Receiving Chemotherapy  Today you received the following chemotherapy agents: Kanjinti  To help prevent nausea and vomiting after your treatment, we encourage you to take your nausea medication as directed.    If you develop nausea and vomiting that is not controlled by your nausea medication, call the clinic.   BELOW ARE SYMPTOMS THAT SHOULD BE REPORTED IMMEDIATELY:  *FEVER GREATER THAN 100.5 F  *CHILLS WITH OR WITHOUT FEVER  NAUSEA AND VOMITING THAT IS NOT CONTROLLED WITH YOUR NAUSEA MEDICATION  *UNUSUAL SHORTNESS OF BREATH  *UNUSUAL BRUISING OR BLEEDING  TENDERNESS IN MOUTH AND THROAT WITH OR WITHOUT PRESENCE OF ULCERS  *URINARY PROBLEMS  *BOWEL PROBLEMS  UNUSUAL RASH Items with * indicate a potential emergency and should be followed up as soon as possible.  Feel free to call the clinic should you have any questions or concerns. The clinic phone number is [phone].  Please show the CHEMO ALERT CARD at check-in to the Emergency Department and triage nurse.   

## 2019-07-26 ENCOUNTER — Ambulatory Visit
Admission: RE | Admit: 2019-07-26 | Discharge: 2019-07-26 | Disposition: A | Discharge status: Home / Self Care | Payer: BC Managed Care – PPO | Source: Ambulatory Visit | Attending: Oncology | Admitting: Oncology

## 2019-07-26 ENCOUNTER — Other Ambulatory Visit: Payer: Self-pay | Admitting: Family Medicine

## 2019-07-26 ENCOUNTER — Other Ambulatory Visit: Payer: Self-pay

## 2019-07-26 DIAGNOSIS — Z170 Estrogen receptor positive status [ER+]: Secondary | ICD-10-CM

## 2019-07-26 DIAGNOSIS — C50212 Malignant neoplasm of upper-inner quadrant of left female breast: Secondary | ICD-10-CM

## 2019-07-26 DIAGNOSIS — R922 Inconclusive mammogram: Secondary | ICD-10-CM | POA: Diagnosis not present

## 2019-07-28 ENCOUNTER — Telehealth: Payer: Self-pay | Admitting: Family Medicine

## 2019-07-28 MED ORDER — AMPHETAMINE-DEXTROAMPHETAMINE 30 MG PO TABS: ORAL_TABLET | ORAL | 0 refills | Status: DC

## 2019-07-28 MED ORDER — AMPHETAMINE-DEXTROAMPHETAMINE 30 MG PO TABS: 30.0000 mg | ORAL_TABLET | Freq: Every day | ORAL | 0 refills | Status: DC

## 2019-07-28 NOTE — Telephone Encounter (Signed)
Medication Refill - Medication: amphetamine-dextroamphetamine (ADDERALL) 30 MG tablet    Preferred Pharmacy (with phone number or street name):  [id number] - [place], [place]. AT [address] [place] [phone] (Phone) [phone] (Fax)

## 2019-07-29 ENCOUNTER — Telehealth: Payer: Self-pay

## 2019-07-29 ENCOUNTER — Inpatient Hospital Stay: Payer: BC Managed Care – PPO

## 2019-07-29 NOTE — Telephone Encounter (Signed)
Pt no show for lab and infusion appt this morning.  LVM for pt to call and reschedule.

## 2019-08-02 ENCOUNTER — Other Ambulatory Visit: Payer: Self-pay

## 2019-08-02 ENCOUNTER — Inpatient Hospital Stay: Payer: BC Managed Care – PPO

## 2019-08-02 VITALS — BP 139/92 | HR 74 | Temp 98.0°F | Resp 20

## 2019-08-02 DIAGNOSIS — Z5111 Encounter for antineoplastic chemotherapy: Secondary | ICD-10-CM | POA: Diagnosis not present

## 2019-08-02 DIAGNOSIS — Z170 Estrogen receptor positive status [ER+]: Secondary | ICD-10-CM | POA: Diagnosis not present

## 2019-08-02 DIAGNOSIS — C50212 Malignant neoplasm of upper-inner quadrant of left female breast: Secondary | ICD-10-CM

## 2019-08-02 DIAGNOSIS — C50312 Malignant neoplasm of lower-inner quadrant of left female breast: Secondary | ICD-10-CM | POA: Diagnosis not present

## 2019-08-02 DIAGNOSIS — Z23 Encounter for immunization: Secondary | ICD-10-CM

## 2019-08-02 DIAGNOSIS — Z95828 Presence of other vascular implants and grafts: Secondary | ICD-10-CM

## 2019-08-02 LAB — CBC WITH DIFFERENTIAL/PLATELET
Abs Immature Granulocytes: 0.01 10*3/uL (ref 0.00–0.07)
Basophils Absolute: 0.1 10*3/uL (ref 0.0–0.1)
Basophils Relative: 1 %
Eosinophils Absolute: 0.3 10*3/uL (ref 0.0–0.5)
Eosinophils Relative: 5 %
HCT: 39.1 % (ref 36.0–46.0)
Hemoglobin: 12.6 g/dL (ref 12.0–15.0)
Immature Granulocytes: 0 %
Lymphocytes Relative: 23 %
Lymphs Abs: 1.4 10*3/uL (ref 0.7–4.0)
MCH: 28.1 pg (ref 26.0–34.0)
MCHC: 32.2 g/dL (ref 30.0–36.0)
MCV: 87.3 fL (ref 80.0–100.0)
Monocytes Absolute: 0.5 10*3/uL (ref 0.1–1.0)
Monocytes Relative: 9 %
Neutro Abs: 3.9 10*3/uL (ref 1.7–7.7)
Neutrophils Relative %: 62 %
Platelets: 194 10*3/uL (ref 150–400)
RBC: 4.48 MIL/uL (ref 3.87–5.11)
RDW: 12.8 % (ref 11.5–15.5)
WBC: 6.2 10*3/uL (ref 4.0–10.5)
nRBC: 0 % (ref 0.0–0.2)

## 2019-08-02 LAB — COMPREHENSIVE METABOLIC PANEL
ALT: 20 U/L (ref 0–44)
AST: 18 U/L (ref 15–41)
Albumin: 3.8 g/dL (ref 3.5–5.0)
Alkaline Phosphatase: 78 U/L (ref 38–126)
Anion gap: 11 (ref 5–15)
BUN: 19 mg/dL (ref 8–23)
CO2: 25 mmol/L (ref 22–32)
Calcium: 9.5 mg/dL (ref 8.9–10.3)
Chloride: 105 mmol/L (ref 98–111)
Creatinine, Ser: 0.97 mg/dL (ref 0.44–1.00)
GFR calc Af Amer: >60 mL/min (ref >60)
GFR calc non Af Amer: >60 mL/min (ref >60)
Glucose, Bld: 114 mg/dL — ABNORMAL HIGH (ref 70–99)
Potassium: 3.9 mmol/L (ref 3.5–5.1)
Sodium: 141 mmol/L (ref 135–145)
Total Bilirubin: <0.2 mg/dL — ABNORMAL LOW (ref 0.3–1.2)
Total Protein: 6.7 g/dL (ref 6.5–8.1)

## 2019-08-02 MED ORDER — TRASTUZUMAB-ANNS CHEMO 150 MG IV SOLR
600.0000 mg | Freq: Once | INTRAVENOUS | Status: AC
  Administered 2019-08-02: 600 mg via INTRAVENOUS
  Filled 2019-08-02: qty 28.57

## 2019-08-02 MED ORDER — SODIUM CHLORIDE 0.9% FLUSH
10.0000 mL | INTRAVENOUS | Status: DC | PRN
  Administered 2019-08-02: 10:00:00 10 mL
  Filled 2019-08-02: qty 10

## 2019-08-02 MED ORDER — INFLUENZA VAC SPLIT QUAD 0.5 ML IM SUSY
PREFILLED_SYRINGE | INTRAMUSCULAR | Status: AC
  Filled 2019-08-02: qty 0.5

## 2019-08-02 MED ORDER — DIPHENHYDRAMINE HCL 25 MG PO CAPS
ORAL_CAPSULE | ORAL | Status: AC
  Filled 2019-08-02: qty 1

## 2019-08-02 MED ORDER — ACETAMINOPHEN 325 MG PO TABS
ORAL_TABLET | ORAL | Status: AC
  Filled 2019-08-02: qty 2

## 2019-08-02 MED ORDER — SODIUM CHLORIDE 0.9% FLUSH
10.0000 mL | INTRAVENOUS | Status: DC | PRN
  Administered 2019-08-02: 10 mL
  Filled 2019-08-02: qty 10

## 2019-08-02 MED ORDER — INFLUENZA VAC SPLIT QUAD 0.5 ML IM SUSY
0.5000 mL | PREFILLED_SYRINGE | Freq: Once | INTRAMUSCULAR | Status: AC
  Administered 2019-08-02: 10:00:00 0.5 mL via INTRAMUSCULAR

## 2019-08-02 MED ORDER — ACETAMINOPHEN 325 MG PO TABS
650.0000 mg | ORAL_TABLET | Freq: Once | ORAL | Status: AC
  Administered 2019-08-02: 650 mg via ORAL

## 2019-08-02 MED ORDER — SODIUM CHLORIDE 0.9 % IV SOLN
Freq: Once | INTRAVENOUS | Status: AC
  Administered 2019-08-02: 09:00:00 via INTRAVENOUS
  Filled 2019-08-02: qty 250

## 2019-08-02 MED ORDER — DIPHENHYDRAMINE HCL 25 MG PO CAPS
25.0000 mg | ORAL_CAPSULE | Freq: Once | ORAL | Status: AC
  Administered 2019-08-02: 25 mg via ORAL

## 2019-08-02 MED ORDER — HEPARIN SOD (PORK) LOCK FLUSH 100 UNIT/ML IV SOLN
500.0000 [IU] | Freq: Once | INTRAVENOUS | Status: AC | PRN
  Administered 2019-08-02: 10:00:00 500 [IU]
  Filled 2019-08-02: qty 5

## 2019-08-02 NOTE — Patient Instructions (Signed)
[facility] Discharge Instructions for Patients Receiving Chemotherapy  Today you received the following chemotherapy agents: Kanjinti  To help prevent nausea and vomiting after your treatment, we encourage you to take your nausea medication as directed.    If you develop nausea and vomiting that is not controlled by your nausea medication, call the clinic.   BELOW ARE SYMPTOMS THAT SHOULD BE REPORTED IMMEDIATELY:  *FEVER GREATER THAN 100.5 F  *CHILLS WITH OR WITHOUT FEVER  NAUSEA AND VOMITING THAT IS NOT CONTROLLED WITH YOUR NAUSEA MEDICATION  *UNUSUAL SHORTNESS OF BREATH  *UNUSUAL BRUISING OR BLEEDING  TENDERNESS IN MOUTH AND THROAT WITH OR WITHOUT PRESENCE OF ULCERS  *URINARY PROBLEMS  *BOWEL PROBLEMS  UNUSUAL RASH Items with * indicate a potential emergency and should be followed up as soon as possible.  Feel free to call the clinic should you have any questions or concerns. The clinic phone number is [phone].  Please show the CHEMO ALERT CARD at check-in to the Emergency Department and triage nurse.   

## 2019-08-02 NOTE — Progress Notes (Signed)
No reload necessary per MD. Kennith Center, Pharm.D., CPP 08/02/2019@8 :56 AM

## 2019-08-02 NOTE — Patient Instructions (Signed)

## 2019-08-13 ENCOUNTER — Ambulatory Visit (HOSPITAL_COMMUNITY)
Admission: RE | Admit: 2019-08-13 | Discharge: 2019-08-13 | Disposition: A | Discharge status: Home / Self Care | Payer: BC Managed Care – PPO | Source: Ambulatory Visit | Attending: Oncology | Admitting: Oncology

## 2019-08-13 ENCOUNTER — Other Ambulatory Visit: Payer: Self-pay

## 2019-08-13 ENCOUNTER — Telehealth: Payer: Self-pay | Admitting: *Deleted

## 2019-08-13 ENCOUNTER — Other Ambulatory Visit: Payer: Self-pay | Admitting: Oncology

## 2019-08-13 DIAGNOSIS — C50212 Malignant neoplasm of upper-inner quadrant of left female breast: Secondary | ICD-10-CM

## 2019-08-13 DIAGNOSIS — M545 Low back pain: Secondary | ICD-10-CM | POA: Diagnosis not present

## 2019-08-13 DIAGNOSIS — Z170 Estrogen receptor positive status [ER+]: Secondary | ICD-10-CM | POA: Insufficient documentation

## 2019-08-13 NOTE — Telephone Encounter (Signed)
Pt called with c/o lower back pain. Dr. Jana Hakim ordered xray of lower back. Called pt and left verified vm with directions and instructions for lower back xray.

## 2019-08-15 ENCOUNTER — Other Ambulatory Visit: Payer: Self-pay | Admitting: Oncology

## 2019-08-15 DIAGNOSIS — C50212 Malignant neoplasm of upper-inner quadrant of left female breast: Secondary | ICD-10-CM

## 2019-08-15 NOTE — Progress Notes (Signed)
I called Tiffany Montes and left her message letting her know that the plain films of her lumbar spine do show a spot that needs further evaluation.  I put her in for a bone scan and asked her to call us if she had any questions.

## 2019-08-17 ENCOUNTER — Telehealth: Payer: Self-pay | Admitting: *Deleted

## 2019-08-17 NOTE — Telephone Encounter (Signed)
Left vm for pt to return call regarding questions she would like to discuss. Contact information provided.

## 2019-08-19 ENCOUNTER — Inpatient Hospital Stay: Payer: BC Managed Care – PPO

## 2019-08-19 ENCOUNTER — Inpatient Hospital Stay: Payer: BC Managed Care – PPO | Attending: Oncology

## 2019-08-19 ENCOUNTER — Other Ambulatory Visit: Payer: Self-pay

## 2019-08-19 VITALS — BP 161/93 | HR 74 | Temp 98.3°F | Resp 18

## 2019-08-19 DIAGNOSIS — Z95828 Presence of other vascular implants and grafts: Secondary | ICD-10-CM

## 2019-08-19 DIAGNOSIS — Z90710 Acquired absence of both cervix and uterus: Secondary | ICD-10-CM | POA: Insufficient documentation

## 2019-08-19 DIAGNOSIS — Z79811 Long term (current) use of aromatase inhibitors: Secondary | ICD-10-CM | POA: Insufficient documentation

## 2019-08-19 DIAGNOSIS — C50312 Malignant neoplasm of lower-inner quadrant of left female breast: Secondary | ICD-10-CM | POA: Insufficient documentation

## 2019-08-19 DIAGNOSIS — E039 Hypothyroidism, unspecified: Secondary | ICD-10-CM | POA: Insufficient documentation

## 2019-08-19 DIAGNOSIS — Z9221 Personal history of antineoplastic chemotherapy: Secondary | ICD-10-CM | POA: Diagnosis not present

## 2019-08-19 DIAGNOSIS — C50212 Malignant neoplasm of upper-inner quadrant of left female breast: Secondary | ICD-10-CM

## 2019-08-19 DIAGNOSIS — Z79899 Other long term (current) drug therapy: Secondary | ICD-10-CM | POA: Insufficient documentation

## 2019-08-19 DIAGNOSIS — Z170 Estrogen receptor positive status [ER+]: Secondary | ICD-10-CM | POA: Diagnosis not present

## 2019-08-19 DIAGNOSIS — Z5112 Encounter for antineoplastic immunotherapy: Secondary | ICD-10-CM | POA: Diagnosis not present

## 2019-08-19 DIAGNOSIS — Z923 Personal history of irradiation: Secondary | ICD-10-CM | POA: Diagnosis not present

## 2019-08-19 DIAGNOSIS — I10 Essential (primary) hypertension: Secondary | ICD-10-CM | POA: Insufficient documentation

## 2019-08-19 LAB — COMPREHENSIVE METABOLIC PANEL
ALT: 22 U/L (ref 0–44)
AST: 19 U/L (ref 15–41)
Albumin: 3.9 g/dL (ref 3.5–5.0)
Alkaline Phosphatase: 88 U/L (ref 38–126)
Anion gap: 13 (ref 5–15)
BUN: 17 mg/dL (ref 8–23)
CO2: 26 mmol/L (ref 22–32)
Calcium: 9.4 mg/dL (ref 8.9–10.3)
Chloride: 102 mmol/L (ref 98–111)
Creatinine, Ser: 0.85 mg/dL (ref 0.44–1.00)
GFR calc Af Amer: >60 mL/min (ref >60)
GFR calc non Af Amer: >60 mL/min (ref >60)
Glucose, Bld: 107 mg/dL — ABNORMAL HIGH (ref 70–99)
Potassium: 4 mmol/L (ref 3.5–5.1)
Sodium: 141 mmol/L (ref 135–145)
Total Bilirubin: 0.3 mg/dL (ref 0.3–1.2)
Total Protein: 6.8 g/dL (ref 6.5–8.1)

## 2019-08-19 LAB — CBC WITH DIFFERENTIAL/PLATELET
Abs Immature Granulocytes: 0.02 10*3/uL (ref 0.00–0.07)
Basophils Absolute: 0 10*3/uL (ref 0.0–0.1)
Basophils Relative: 1 %
Eosinophils Absolute: 0.3 10*3/uL (ref 0.0–0.5)
Eosinophils Relative: 4 %
HCT: 38.2 % (ref 36.0–46.0)
Hemoglobin: 12.5 g/dL (ref 12.0–15.0)
Immature Granulocytes: 0 %
Lymphocytes Relative: 23 %
Lymphs Abs: 1.4 10*3/uL (ref 0.7–4.0)
MCH: 28.6 pg (ref 26.0–34.0)
MCHC: 32.7 g/dL (ref 30.0–36.0)
MCV: 87.4 fL (ref 80.0–100.0)
Monocytes Absolute: 0.6 10*3/uL (ref 0.1–1.0)
Monocytes Relative: 10 %
Neutro Abs: 4 10*3/uL (ref 1.7–7.7)
Neutrophils Relative %: 62 %
Platelets: 189 10*3/uL (ref 150–400)
RBC: 4.37 MIL/uL (ref 3.87–5.11)
RDW: 12.9 % (ref 11.5–15.5)
WBC: 6.4 10*3/uL (ref 4.0–10.5)
nRBC: 0 % (ref 0.0–0.2)

## 2019-08-19 MED ORDER — SODIUM CHLORIDE 0.9% FLUSH
10.0000 mL | INTRAVENOUS | Status: DC | PRN
  Administered 2019-08-19: 10 mL
  Filled 2019-08-19: qty 10

## 2019-08-19 MED ORDER — ACETAMINOPHEN 325 MG PO TABS
650.0000 mg | ORAL_TABLET | Freq: Once | ORAL | Status: AC
  Administered 2019-08-19: 09:00:00 650 mg via ORAL

## 2019-08-19 MED ORDER — SODIUM CHLORIDE 0.9% FLUSH
10.0000 mL | INTRAVENOUS | Status: DC | PRN
  Administered 2019-08-19: 11:00:00 10 mL
  Filled 2019-08-19: qty 10

## 2019-08-19 MED ORDER — DIPHENHYDRAMINE HCL 25 MG PO CAPS
25.0000 mg | ORAL_CAPSULE | Freq: Once | ORAL | Status: AC
  Administered 2019-08-19: 25 mg via ORAL

## 2019-08-19 MED ORDER — DIPHENHYDRAMINE HCL 25 MG PO CAPS
ORAL_CAPSULE | ORAL | Status: AC
  Filled 2019-08-19: qty 1

## 2019-08-19 MED ORDER — ACETAMINOPHEN 325 MG PO TABS
ORAL_TABLET | ORAL | Status: AC
  Filled 2019-08-19: qty 2

## 2019-08-19 MED ORDER — SODIUM CHLORIDE 0.9 % IV SOLN
Freq: Once | INTRAVENOUS | Status: AC
  Administered 2019-08-19: 09:00:00 via INTRAVENOUS
  Filled 2019-08-19: qty 250

## 2019-08-19 MED ORDER — TRASTUZUMAB-ANNS CHEMO 150 MG IV SOLR
600.0000 mg | Freq: Once | INTRAVENOUS | Status: AC
  Administered 2019-08-19: 10:00:00 600 mg via INTRAVENOUS
  Filled 2019-08-19: qty 28.57

## 2019-08-19 MED ORDER — HEPARIN SOD (PORK) LOCK FLUSH 100 UNIT/ML IV SOLN
500.0000 [IU] | Freq: Once | INTRAVENOUS | Status: AC | PRN
  Administered 2019-08-19: 500 [IU]
  Filled 2019-08-19: qty 5

## 2019-08-19 NOTE — Patient Instructions (Signed)
[facility] Discharge Instructions for Patients Receiving Chemotherapy  Today you received the following chemotherapy agents: Kanjinti  To help prevent nausea and vomiting after your treatment, we encourage you to take your nausea medication as directed.    If you develop nausea and vomiting that is not controlled by your nausea medication, call the clinic.   BELOW ARE SYMPTOMS THAT SHOULD BE REPORTED IMMEDIATELY:  *FEVER GREATER THAN 100.5 F  *CHILLS WITH OR WITHOUT FEVER  NAUSEA AND VOMITING THAT IS NOT CONTROLLED WITH YOUR NAUSEA MEDICATION  *UNUSUAL SHORTNESS OF BREATH  *UNUSUAL BRUISING OR BLEEDING  TENDERNESS IN MOUTH AND THROAT WITH OR WITHOUT PRESENCE OF ULCERS  *URINARY PROBLEMS  *BOWEL PROBLEMS  UNUSUAL RASH Items with * indicate a potential emergency and should be followed up as soon as possible.  Feel free to call the clinic should you have any questions or concerns. The clinic phone number is [phone].  Please show the CHEMO ALERT CARD at check-in to the Emergency Department and triage nurse.   

## 2019-08-30 ENCOUNTER — Other Ambulatory Visit: Payer: Self-pay

## 2019-08-30 ENCOUNTER — Encounter (HOSPITAL_COMMUNITY)
Admission: RE | Admit: 2019-08-30 | Discharge: 2019-08-30 | Disposition: A | Discharge status: Home / Self Care | Payer: BC Managed Care – PPO | Source: Ambulatory Visit | Attending: Oncology | Admitting: Oncology

## 2019-08-30 DIAGNOSIS — Z170 Estrogen receptor positive status [ER+]: Secondary | ICD-10-CM | POA: Insufficient documentation

## 2019-08-30 DIAGNOSIS — C50212 Malignant neoplasm of upper-inner quadrant of left female breast: Secondary | ICD-10-CM | POA: Diagnosis not present

## 2019-08-30 DIAGNOSIS — C50919 Malignant neoplasm of unspecified site of unspecified female breast: Secondary | ICD-10-CM | POA: Diagnosis not present

## 2019-08-30 MED ORDER — TECHNETIUM TC 99M MEDRONATE IV KIT
20.0000 | PACK | Freq: Once | INTRAVENOUS | Status: AC | PRN
  Administered 2019-08-30: 21 via INTRAVENOUS

## 2019-08-31 ENCOUNTER — Other Ambulatory Visit: Payer: Self-pay | Admitting: Oncology

## 2019-08-31 ENCOUNTER — Encounter: Payer: Self-pay | Admitting: *Deleted

## 2019-08-31 ENCOUNTER — Telehealth: Payer: Self-pay | Admitting: Family Medicine

## 2019-08-31 DIAGNOSIS — Z170 Estrogen receptor positive status [ER+]: Secondary | ICD-10-CM

## 2019-08-31 DIAGNOSIS — C50212 Malignant neoplasm of upper-inner quadrant of left female breast: Secondary | ICD-10-CM

## 2019-08-31 NOTE — Telephone Encounter (Signed)
This was refilled for 3 months on 10-21.   Not sure why we keep getting these requests for meds done for 3 months??

## 2019-08-31 NOTE — Telephone Encounter (Signed)
Copied from [place] [id number]. Topic: Quick Communication - Rx Refill/Question >> Aug 31, 2019  1:54 PM Rainey Pines A wrote: Medication: amphetamine-dextroamphetamine (ADDERALL) 30 MG tablet   Has the patient contacted their pharmacy?Yes (Agent: If no, request that the patient contact the pharmacy for the refill.) (Agent: If yes, when and what did the pharmacy advise?)Contact PCP  Preferred Pharmacy (with phone number or street name[id number] [place], [place]. AT [address] [place] [phone] (Phone) [phone] (Fax)    Agent: Please be advised that RX refills may take up to 3 business days. We ask that you follow-up with your pharmacy.

## 2019-08-31 NOTE — Progress Notes (Signed)
I called Tiffany Montes and gave her the results of her bone scan which show the area of concern in the left iliac bone to be benign and most likely a bone island.  On the other hand there is multifocal activities throughout the cervical thoracic and lumbar spine, left greater trochanter, and sinuses.  I discussed all this with her and I explained my feeling is this is likely benign and degenerative but we need to prove it.  She is agreeable to proceeding to additional tests which will include total spinal MRI, head CT, chest CT with contrast, and left hip films.  Hopefully we can get all this done within the next 10 days or so so she can be reassured.  I will call her with results as soon as they become available.

## 2019-09-03 ENCOUNTER — Telehealth: Payer: Self-pay | Admitting: *Deleted

## 2019-09-03 NOTE — Telephone Encounter (Signed)
Left message to let patient know the xrays that are ordered are a walk in appointment at [facility].

## 2019-09-09 ENCOUNTER — Inpatient Hospital Stay: Payer: BC Managed Care – PPO | Attending: Oncology

## 2019-09-09 ENCOUNTER — Other Ambulatory Visit: Payer: Self-pay

## 2019-09-09 ENCOUNTER — Telehealth: Payer: Self-pay | Admitting: Adult Health

## 2019-09-09 ENCOUNTER — Other Ambulatory Visit: Payer: Self-pay | Admitting: Adult Health

## 2019-09-09 ENCOUNTER — Encounter: Payer: Self-pay | Admitting: Adult Health

## 2019-09-09 ENCOUNTER — Inpatient Hospital Stay: Payer: BC Managed Care – PPO

## 2019-09-09 ENCOUNTER — Inpatient Hospital Stay (HOSPITAL_BASED_OUTPATIENT_CLINIC_OR_DEPARTMENT_OTHER): Payer: BC Managed Care – PPO | Admitting: Adult Health

## 2019-09-09 VITALS — BP 144/85 | HR 66 | Temp 98.2°F | Resp 18 | Ht 62.25 in | Wt 215.5 lb

## 2019-09-09 DIAGNOSIS — Z170 Estrogen receptor positive status [ER+]: Secondary | ICD-10-CM

## 2019-09-09 DIAGNOSIS — M549 Dorsalgia, unspecified: Secondary | ICD-10-CM | POA: Insufficient documentation

## 2019-09-09 DIAGNOSIS — E039 Hypothyroidism, unspecified: Secondary | ICD-10-CM | POA: Diagnosis not present

## 2019-09-09 DIAGNOSIS — C50312 Malignant neoplasm of lower-inner quadrant of left female breast: Secondary | ICD-10-CM | POA: Insufficient documentation

## 2019-09-09 DIAGNOSIS — Z95828 Presence of other vascular implants and grafts: Secondary | ICD-10-CM

## 2019-09-09 DIAGNOSIS — F419 Anxiety disorder, unspecified: Secondary | ICD-10-CM | POA: Diagnosis not present

## 2019-09-09 DIAGNOSIS — C50212 Malignant neoplasm of upper-inner quadrant of left female breast: Secondary | ICD-10-CM

## 2019-09-09 DIAGNOSIS — F329 Major depressive disorder, single episode, unspecified: Secondary | ICD-10-CM | POA: Insufficient documentation

## 2019-09-09 DIAGNOSIS — Z803 Family history of malignant neoplasm of breast: Secondary | ICD-10-CM | POA: Insufficient documentation

## 2019-09-09 DIAGNOSIS — Z5112 Encounter for antineoplastic immunotherapy: Secondary | ICD-10-CM | POA: Diagnosis not present

## 2019-09-09 DIAGNOSIS — I10 Essential (primary) hypertension: Secondary | ICD-10-CM | POA: Diagnosis not present

## 2019-09-09 DIAGNOSIS — E785 Hyperlipidemia, unspecified: Secondary | ICD-10-CM | POA: Insufficient documentation

## 2019-09-09 DIAGNOSIS — Z79899 Other long term (current) drug therapy: Secondary | ICD-10-CM | POA: Diagnosis not present

## 2019-09-09 DIAGNOSIS — Z79811 Long term (current) use of aromatase inhibitors: Secondary | ICD-10-CM | POA: Diagnosis not present

## 2019-09-09 LAB — COMPREHENSIVE METABOLIC PANEL
ALT: 21 U/L (ref 0–44)
AST: 19 U/L (ref 15–41)
Albumin: 3.8 g/dL (ref 3.5–5.0)
Alkaline Phosphatase: 93 U/L (ref 38–126)
Anion gap: 11 (ref 5–15)
BUN: 20 mg/dL (ref 8–23)
CO2: 25 mmol/L (ref 22–32)
Calcium: 9.1 mg/dL (ref 8.9–10.3)
Chloride: 105 mmol/L (ref 98–111)
Creatinine, Ser: 1 mg/dL (ref 0.44–1.00)
GFR calc Af Amer: >60 mL/min (ref >60)
GFR calc non Af Amer: 59 mL/min — ABNORMAL LOW (ref >60)
Glucose, Bld: 111 mg/dL — ABNORMAL HIGH (ref 70–99)
Potassium: 3.9 mmol/L (ref 3.5–5.1)
Sodium: 141 mmol/L (ref 135–145)
Total Bilirubin: <0.2 mg/dL — ABNORMAL LOW (ref 0.3–1.2)
Total Protein: 6.7 g/dL (ref 6.5–8.1)

## 2019-09-09 LAB — CBC WITH DIFFERENTIAL/PLATELET
Abs Immature Granulocytes: 0.02 10*3/uL (ref 0.00–0.07)
Basophils Absolute: 0 10*3/uL (ref 0.0–0.1)
Basophils Relative: 1 %
Eosinophils Absolute: 0.3 10*3/uL (ref 0.0–0.5)
Eosinophils Relative: 5 %
HCT: 39.6 % (ref 36.0–46.0)
Hemoglobin: 12.7 g/dL (ref 12.0–15.0)
Immature Granulocytes: 0 %
Lymphocytes Relative: 24 %
Lymphs Abs: 1.5 10*3/uL (ref 0.7–4.0)
MCH: 29 pg (ref 26.0–34.0)
MCHC: 32.1 g/dL (ref 30.0–36.0)
MCV: 90.4 fL (ref 80.0–100.0)
Monocytes Absolute: 0.6 10*3/uL (ref 0.1–1.0)
Monocytes Relative: 9 %
Neutro Abs: 3.9 10*3/uL (ref 1.7–7.7)
Neutrophils Relative %: 61 %
Platelets: 193 10*3/uL (ref 150–400)
RBC: 4.38 MIL/uL (ref 3.87–5.11)
RDW: 13 % (ref 11.5–15.5)
WBC: 6.3 10*3/uL (ref 4.0–10.5)
nRBC: 0 % (ref 0.0–0.2)

## 2019-09-09 MED ORDER — DIPHENHYDRAMINE HCL 25 MG PO CAPS
25.0000 mg | ORAL_CAPSULE | Freq: Once | ORAL | Status: AC
  Administered 2019-09-09: 25 mg via ORAL

## 2019-09-09 MED ORDER — ACETAMINOPHEN 325 MG PO TABS
650.0000 mg | ORAL_TABLET | Freq: Once | ORAL | Status: AC
  Administered 2019-09-09: 650 mg via ORAL

## 2019-09-09 MED ORDER — ACETAMINOPHEN 325 MG PO TABS
ORAL_TABLET | ORAL | Status: AC
  Filled 2019-09-09: qty 2

## 2019-09-09 MED ORDER — SODIUM CHLORIDE 0.9% FLUSH
10.0000 mL | INTRAVENOUS | Status: DC | PRN
  Administered 2019-09-09: 10 mL
  Filled 2019-09-09: qty 10

## 2019-09-09 MED ORDER — SODIUM CHLORIDE 0.9 % IV SOLN
Freq: Once | INTRAVENOUS | Status: AC
  Administered 2019-09-09: 10:00:00 via INTRAVENOUS
  Filled 2019-09-09: qty 250

## 2019-09-09 MED ORDER — DIPHENHYDRAMINE HCL 25 MG PO CAPS
ORAL_CAPSULE | ORAL | Status: AC
  Filled 2019-09-09: qty 1

## 2019-09-09 MED ORDER — HEPARIN SOD (PORK) LOCK FLUSH 100 UNIT/ML IV SOLN
500.0000 [IU] | Freq: Once | INTRAVENOUS | Status: AC | PRN
  Administered 2019-09-09: 500 [IU]
  Filled 2019-09-09: qty 5

## 2019-09-09 MED ORDER — TRASTUZUMAB-ANNS CHEMO 150 MG IV SOLR
600.0000 mg | Freq: Once | INTRAVENOUS | Status: AC
  Administered 2019-09-09: 600 mg via INTRAVENOUS
  Filled 2019-09-09: qty 28.57

## 2019-09-09 NOTE — Patient Instructions (Signed)
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
## 2019-09-09 NOTE — Progress Notes (Signed)
Echo ordered by Thedore Mins, NP.

## 2019-09-09 NOTE — Patient Instructions (Signed)
[facility] Discharge Instructions for Patients Receiving Chemotherapy  Today you received the following chemotherapy agents trastuzumab.  To help prevent nausea and vomiting after your treatment, we encourage you to take your nausea medication as directed.    If you develop nausea and vomiting that is not controlled by your nausea medication, call the clinic.   BELOW ARE SYMPTOMS THAT SHOULD BE REPORTED IMMEDIATELY:  *FEVER GREATER THAN 100.5 F  *CHILLS WITH OR WITHOUT FEVER  NAUSEA AND VOMITING THAT IS NOT CONTROLLED WITH YOUR NAUSEA MEDICATION  *UNUSUAL SHORTNESS OF BREATH  *UNUSUAL BRUISING OR BLEEDING  TENDERNESS IN MOUTH AND THROAT WITH OR WITHOUT PRESENCE OF ULCERS  *URINARY PROBLEMS  *BOWEL PROBLEMS  UNUSUAL RASH Items with * indicate a potential emergency and should be followed up as soon as possible.  Feel free to call the clinic should you have any questions or concerns. The clinic phone number is [phone].  Please show the CHEMO ALERT CARD at check-in to the Emergency Department and triage nurse.   

## 2019-09-09 NOTE — Telephone Encounter (Signed)
I left a message regarding 12/24

## 2019-09-10 ENCOUNTER — Ambulatory Visit (HOSPITAL_COMMUNITY)
Admission: RE | Admit: 2019-09-10 | Discharge: 2019-09-10 | Disposition: A | Discharge status: Home / Self Care | Payer: BC Managed Care – PPO | Source: Ambulatory Visit | Attending: Oncology | Admitting: Oncology

## 2019-09-10 ENCOUNTER — Other Ambulatory Visit: Payer: Self-pay | Admitting: Oncology

## 2019-09-10 DIAGNOSIS — Z170 Estrogen receptor positive status [ER+]: Secondary | ICD-10-CM

## 2019-09-10 DIAGNOSIS — C50212 Malignant neoplasm of upper-inner quadrant of left female breast: Secondary | ICD-10-CM | POA: Diagnosis not present

## 2019-09-10 DIAGNOSIS — C50919 Malignant neoplasm of unspecified site of unspecified female breast: Secondary | ICD-10-CM | POA: Diagnosis not present

## 2019-09-10 DIAGNOSIS — C50912 Malignant neoplasm of unspecified site of left female breast: Secondary | ICD-10-CM | POA: Diagnosis not present

## 2019-09-10 DIAGNOSIS — I6782 Cerebral ischemia: Secondary | ICD-10-CM | POA: Diagnosis not present

## 2019-09-10 DIAGNOSIS — M25552 Pain in left hip: Secondary | ICD-10-CM | POA: Diagnosis not present

## 2019-09-10 DIAGNOSIS — R911 Solitary pulmonary nodule: Secondary | ICD-10-CM | POA: Diagnosis not present

## 2019-09-10 IMAGING — DX DG CERVICAL SPINE 2 OR 3 VIEWS
4 series · 4 of 4 positions shown · non-contrast
Comparison: None.

CLINICAL DATA: Right-sided arm pain and tingling for 5 days.
History of left breast cancer.

EXAM:
CERVICAL SPINE - 2-3 VIEW

[c-spine lat]
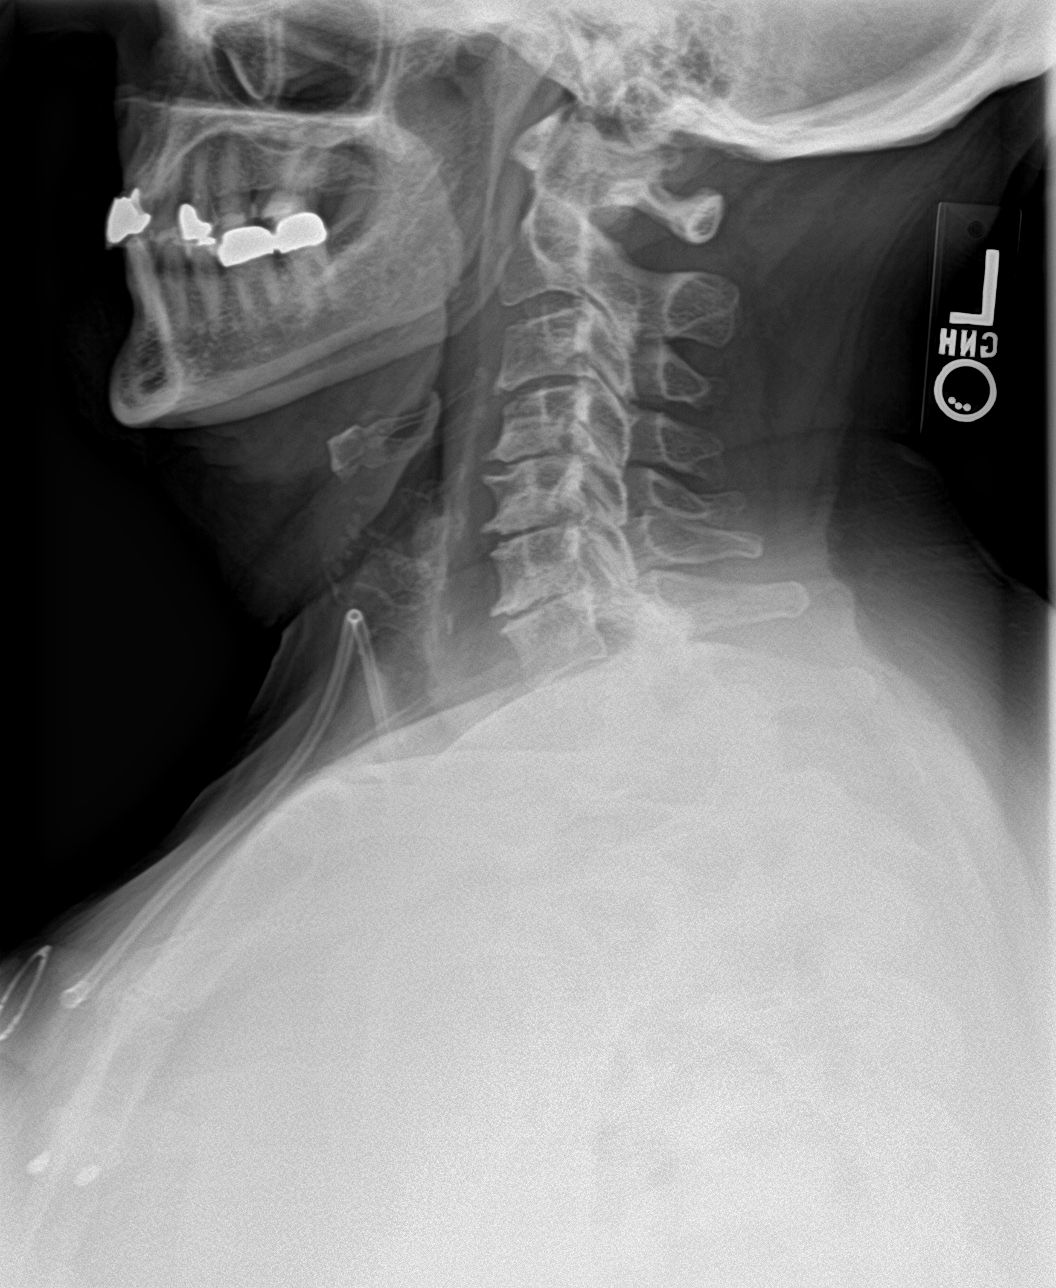

[c-spine ap]
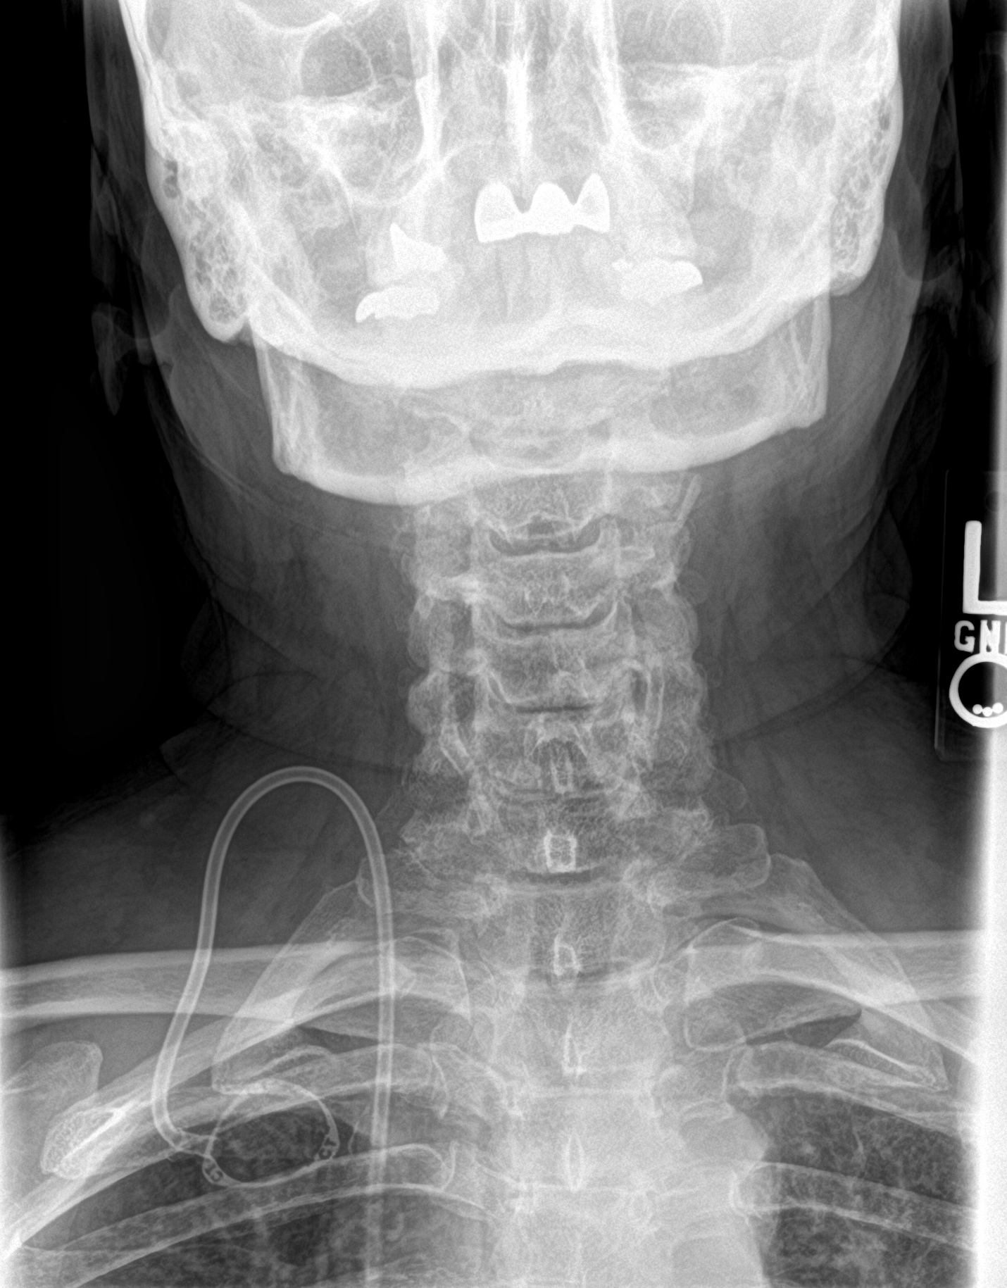

[c-spine open mouth]
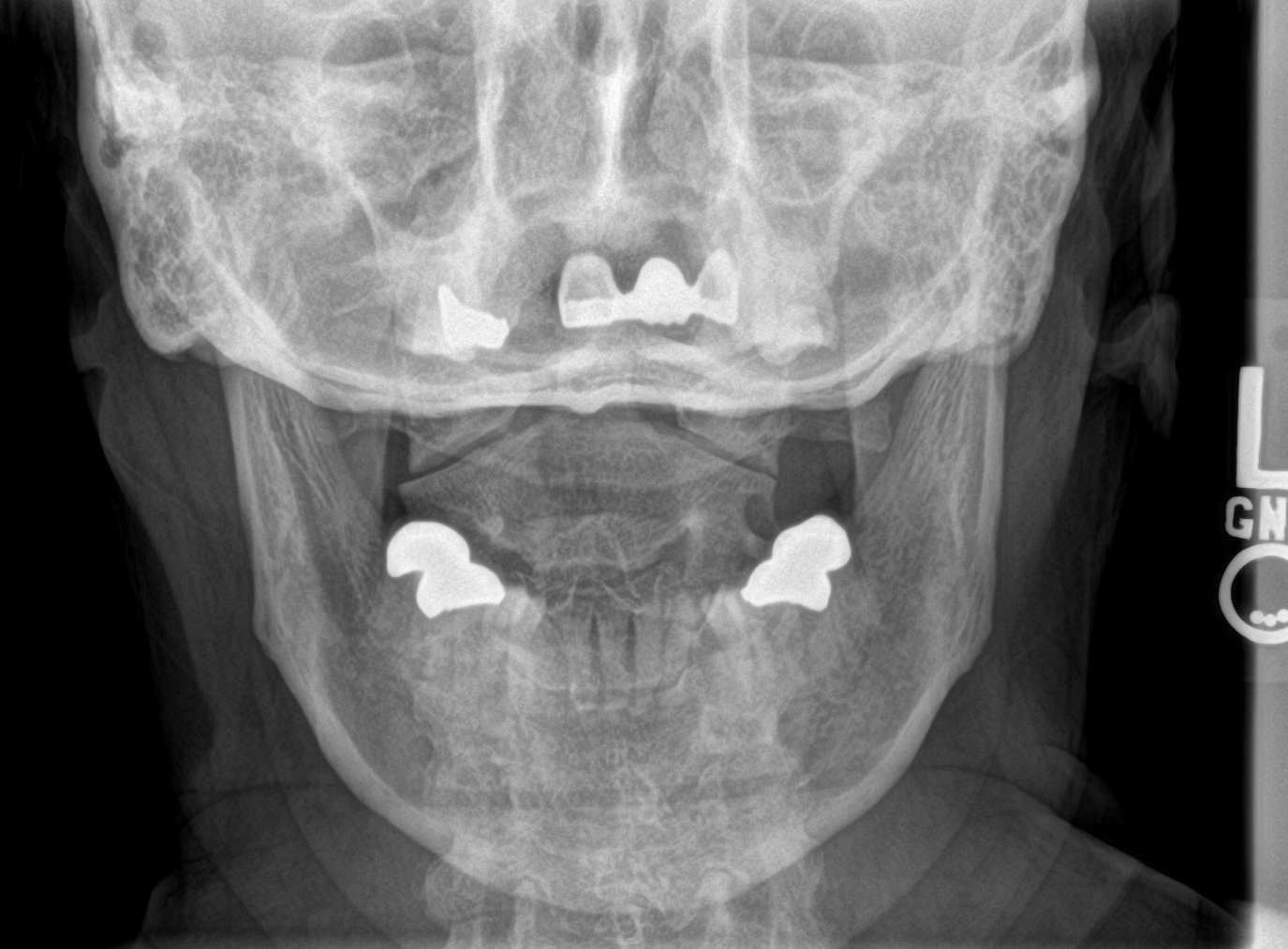

[[person_name]]
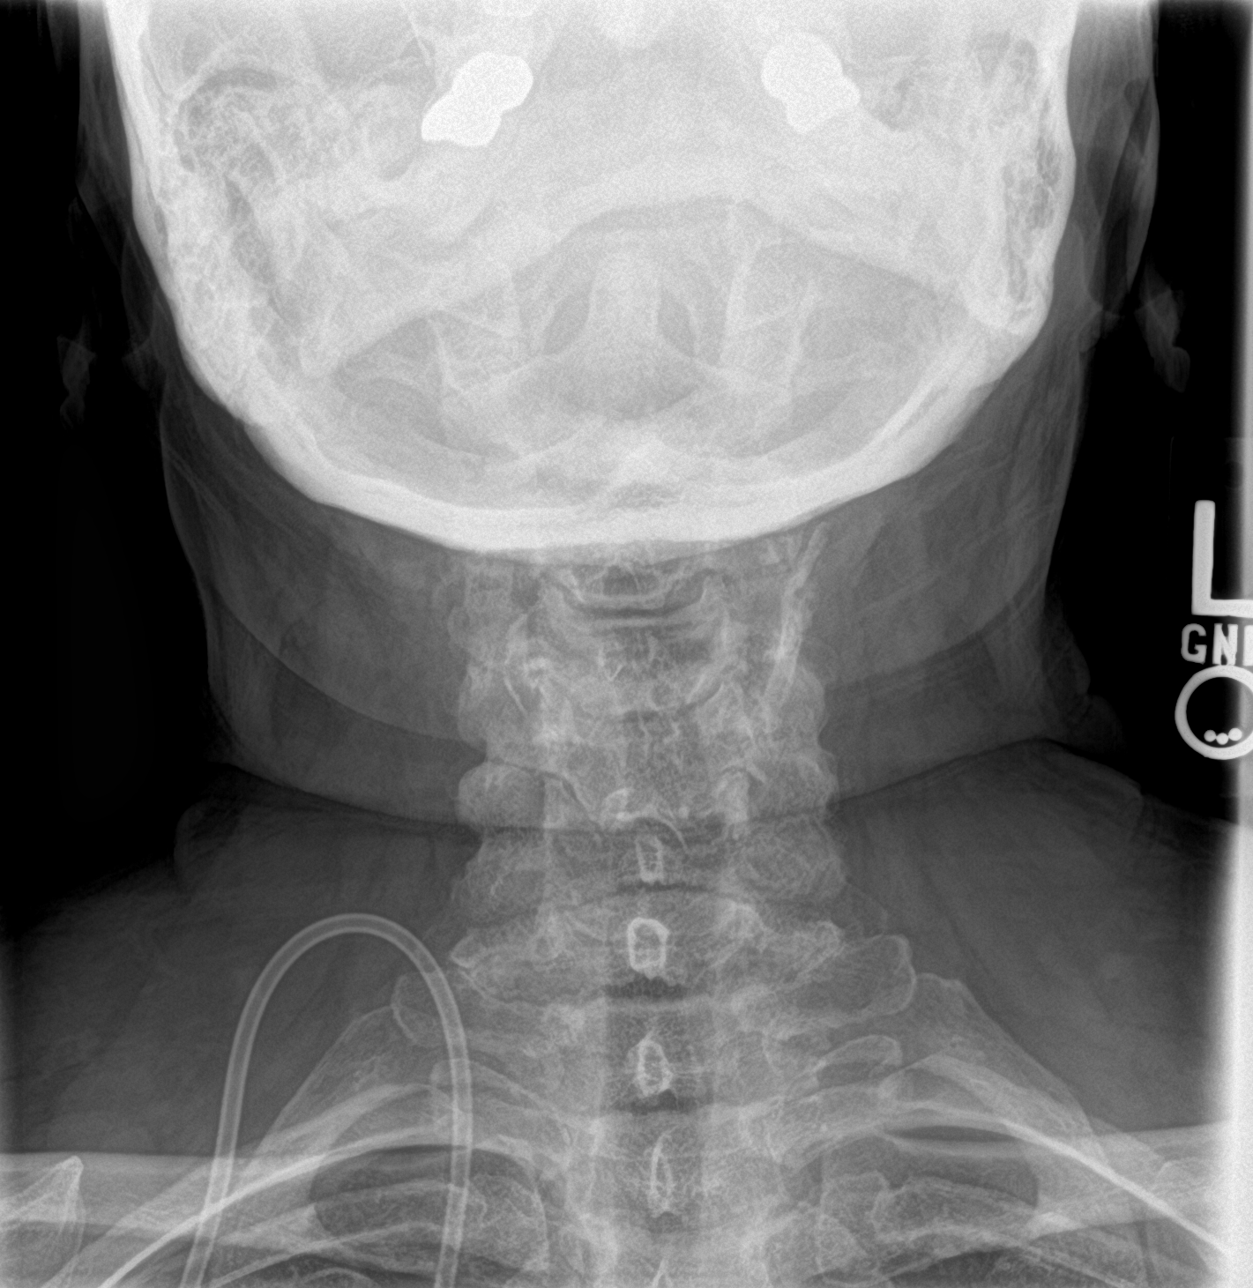

[4 of 4 positions shown; findings below may reference images not displayed]

FINDINGS: The prevertebral soft tissues are normal. The alignment is anatomic
through T1. There is no evidence of acute fracture or traumatic
subluxation. The C1-2 articulation appears normal in the AP
projection. There is multilevel spondylosis with disc space
narrowing, uncinate spurring and facet hypertrophy. Spondylosis is
greatest at C4-5, C5-6 and C6-7. No evidence of metastatic disease.
Right-sided Port-A-Cath noted.
IMPRESSION: Multilevel cervical spondylosis without evidence of acute osseous
findings or metastatic disease.

## 2019-09-10 IMAGING — DX DG SCAPULA*R*
1 series · 1 of 1 positions shown · non-contrast
Comparison: None.

CLINICAL DATA: Shoulder pain and tingling for 5 days. No known
injury. History of left breast cancer.

EXAM:
RIGHT SHOULDER - 2+ VIEW; RIGHT SCAPULA - 2+ VIEWS

[shoulder y view]
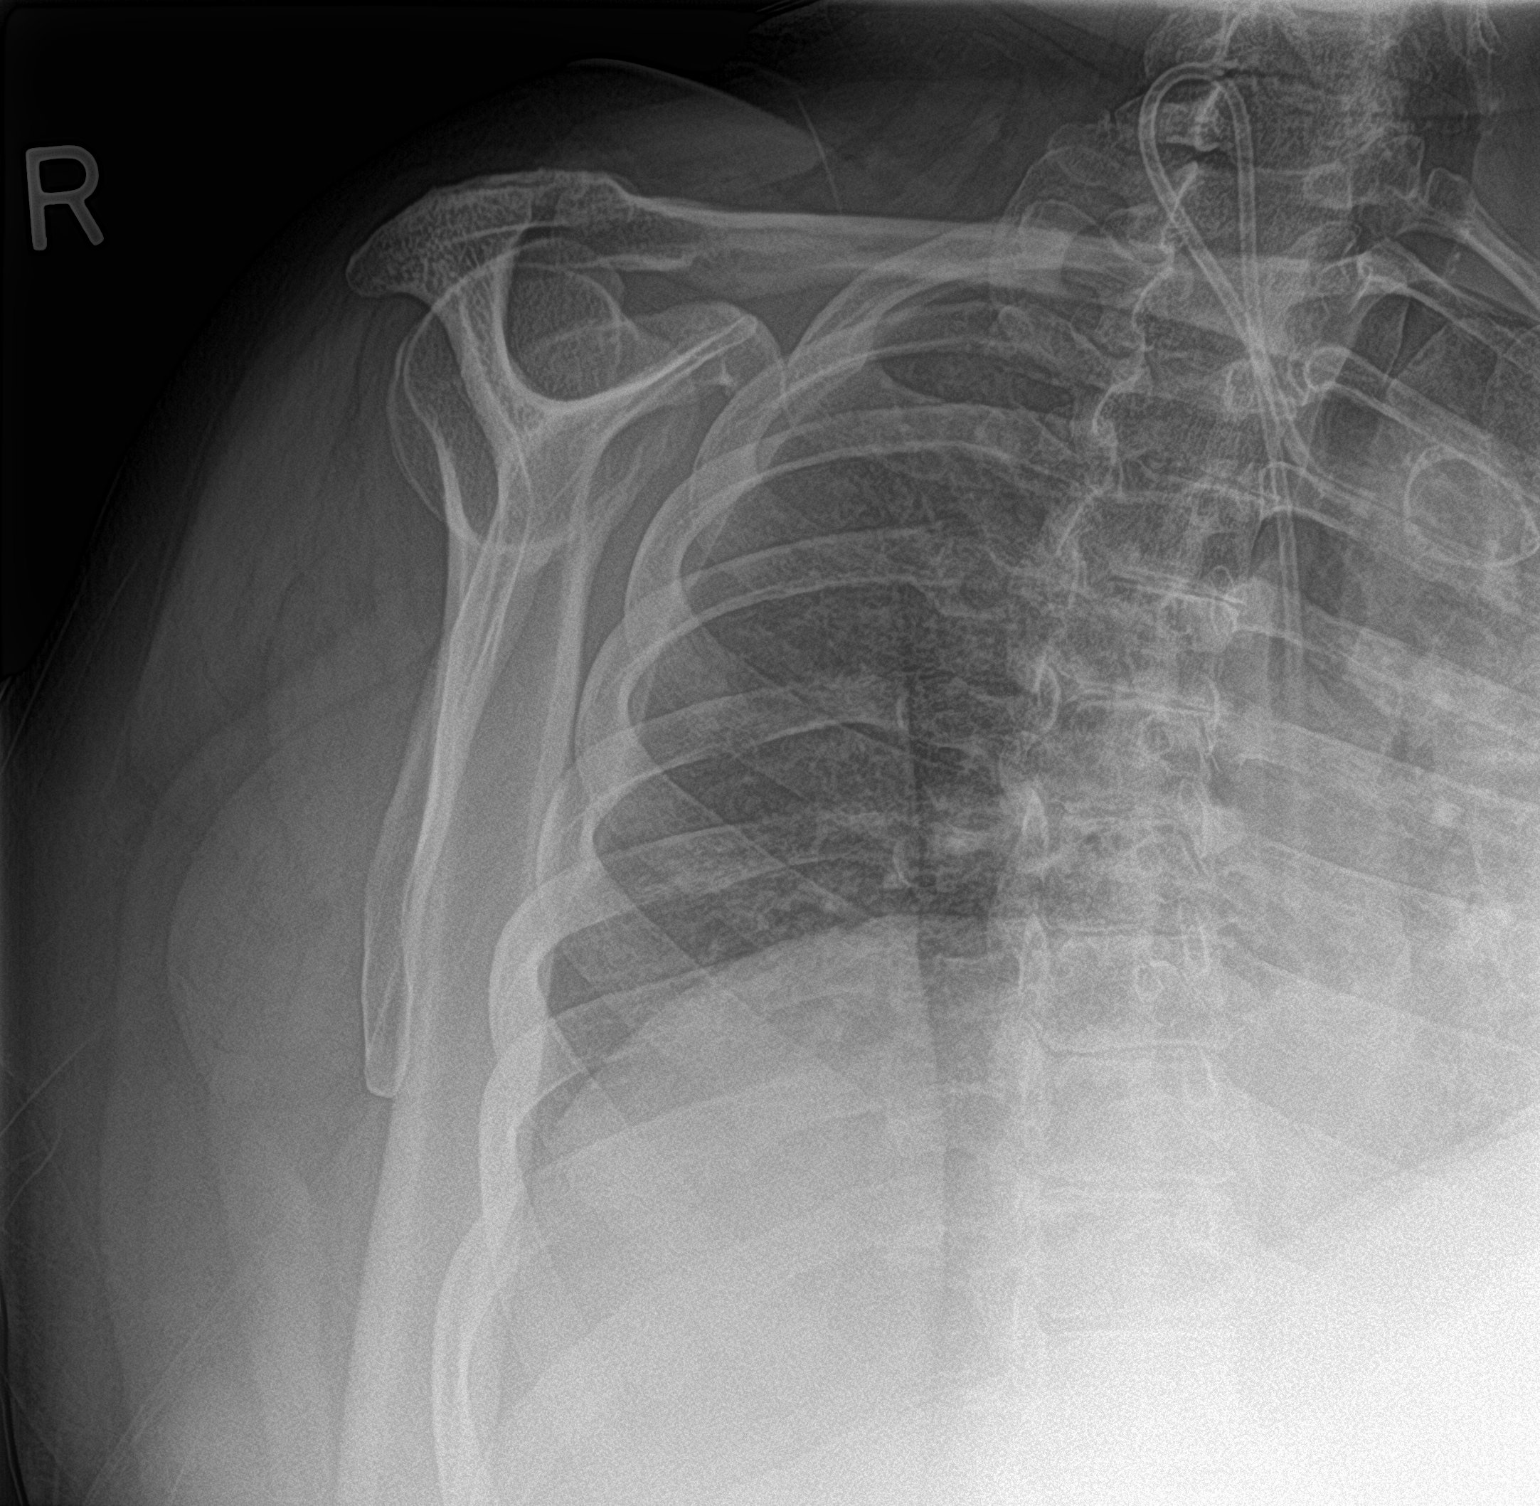

[1 of 1 positions shown; findings below may reference images not displayed]

FINDINGS: The mineralization and alignment are normal. There is no evidence of
acute fracture or dislocation. No evidence of metastatic disease.
The joint spaces are preserved. The subacromial space is preserved.
Small soft tissue calcification noted adjacent to the greater
tuberosity on the AP view, possible hydroxyapatite deposition. Right
IJ Port-A-Cath extends to the level of the superior cavoatrial
junction.
IMPRESSION: 1. Possible hydroxyapatite deposition adjacent to the greater
tuberosity as can be seen with calcific rotator cuff tendinitis or
bursitis.
2. No acute osseous findings, significant arthropathy or evidence of
metastatic disease.

## 2019-09-10 MED ORDER — GADOBUTROL 1 MMOL/ML IV SOLN
10.0000 mL | Freq: Once | INTRAVENOUS | Status: AC | PRN
  Administered 2019-09-10: 10 mL via INTRAVENOUS

## 2019-09-10 MED ORDER — SODIUM CHLORIDE (PF) 0.9 % IJ SOLN
INTRAMUSCULAR | Status: AC
  Filled 2019-09-10: qty 50

## 2019-09-10 MED ORDER — IOHEXOL 300 MG/ML  SOLN
75.0000 mL | Freq: Once | INTRAMUSCULAR | Status: AC | PRN
  Administered 2019-09-10: 75 mL via INTRAVENOUS

## 2019-09-13 ENCOUNTER — Other Ambulatory Visit: Payer: Self-pay

## 2019-09-13 ENCOUNTER — Ambulatory Visit
Admission: RE | Admit: 2019-09-13 | Discharge: 2019-09-13 | Disposition: A | Discharge status: Home / Self Care | Payer: BC Managed Care – PPO | Source: Ambulatory Visit | Attending: Oncology | Admitting: Oncology

## 2019-09-13 ENCOUNTER — Encounter: Payer: Self-pay | Admitting: Oncology

## 2019-09-13 DIAGNOSIS — C50212 Malignant neoplasm of upper-inner quadrant of left female breast: Secondary | ICD-10-CM

## 2019-09-16 ENCOUNTER — Ambulatory Visit (HOSPITAL_COMMUNITY)
Admission: RE | Admit: 2019-09-16 | Discharge: 2019-09-16 | Disposition: A | Discharge status: Home / Self Care | Payer: BC Managed Care – PPO | Source: Ambulatory Visit | Attending: Adult Health | Admitting: Adult Health

## 2019-09-16 ENCOUNTER — Other Ambulatory Visit: Payer: Self-pay

## 2019-09-16 DIAGNOSIS — Z170 Estrogen receptor positive status [ER+]: Secondary | ICD-10-CM | POA: Diagnosis not present

## 2019-09-16 DIAGNOSIS — Z6839 Body mass index (BMI) 39.0-39.9, adult: Secondary | ICD-10-CM | POA: Insufficient documentation

## 2019-09-16 DIAGNOSIS — C50212 Malignant neoplasm of upper-inner quadrant of left female breast: Secondary | ICD-10-CM

## 2019-09-16 DIAGNOSIS — I071 Rheumatic tricuspid insufficiency: Secondary | ICD-10-CM | POA: Insufficient documentation

## 2019-09-16 DIAGNOSIS — I119 Hypertensive heart disease without heart failure: Secondary | ICD-10-CM | POA: Insufficient documentation

## 2019-09-16 DIAGNOSIS — E785 Hyperlipidemia, unspecified: Secondary | ICD-10-CM | POA: Insufficient documentation

## 2019-09-16 NOTE — Progress Notes (Signed)
  Echocardiogram 2D Echocardiogram has been performed.  [facility] 09/16/2019, 12:14 PM

## 2019-09-17 ENCOUNTER — Other Ambulatory Visit: Payer: Self-pay | Admitting: Family Medicine

## 2019-09-17 NOTE — Telephone Encounter (Signed)
Not on current med list. Patient asking if OK to fill?

## 2019-09-18 NOTE — Telephone Encounter (Signed)
Refill once OK. 

## 2019-09-22 ENCOUNTER — Encounter: Payer: Self-pay | Admitting: General Practice

## 2019-09-30 ENCOUNTER — Encounter: Payer: Self-pay | Admitting: Hematology and Oncology

## 2019-09-30 ENCOUNTER — Inpatient Hospital Stay: Payer: BC Managed Care – PPO

## 2019-09-30 ENCOUNTER — Encounter: Payer: Self-pay | Admitting: Adult Health

## 2019-09-30 ENCOUNTER — Other Ambulatory Visit: Payer: Self-pay

## 2019-09-30 ENCOUNTER — Other Ambulatory Visit: Payer: BC Managed Care – PPO

## 2019-09-30 ENCOUNTER — Ambulatory Visit: Payer: BC Managed Care – PPO

## 2019-09-30 ENCOUNTER — Inpatient Hospital Stay (HOSPITAL_BASED_OUTPATIENT_CLINIC_OR_DEPARTMENT_OTHER): Payer: BC Managed Care – PPO | Admitting: Adult Health

## 2019-09-30 VITALS — BP 150/76 | HR 80 | Temp 97.8°F | Resp 17 | Ht 62.25 in | Wt 217.4 lb

## 2019-09-30 DIAGNOSIS — Z79811 Long term (current) use of aromatase inhibitors: Secondary | ICD-10-CM | POA: Diagnosis not present

## 2019-09-30 DIAGNOSIS — C50212 Malignant neoplasm of upper-inner quadrant of left female breast: Secondary | ICD-10-CM

## 2019-09-30 DIAGNOSIS — E785 Hyperlipidemia, unspecified: Secondary | ICD-10-CM | POA: Diagnosis not present

## 2019-09-30 DIAGNOSIS — E039 Hypothyroidism, unspecified: Secondary | ICD-10-CM | POA: Diagnosis not present

## 2019-09-30 DIAGNOSIS — F329 Major depressive disorder, single episode, unspecified: Secondary | ICD-10-CM | POA: Diagnosis not present

## 2019-09-30 DIAGNOSIS — Z170 Estrogen receptor positive status [ER+]: Secondary | ICD-10-CM

## 2019-09-30 DIAGNOSIS — F419 Anxiety disorder, unspecified: Secondary | ICD-10-CM | POA: Diagnosis not present

## 2019-09-30 DIAGNOSIS — C50312 Malignant neoplasm of lower-inner quadrant of left female breast: Secondary | ICD-10-CM | POA: Diagnosis not present

## 2019-09-30 DIAGNOSIS — M549 Dorsalgia, unspecified: Secondary | ICD-10-CM | POA: Diagnosis not present

## 2019-09-30 DIAGNOSIS — Z803 Family history of malignant neoplasm of breast: Secondary | ICD-10-CM | POA: Diagnosis not present

## 2019-09-30 DIAGNOSIS — Z95828 Presence of other vascular implants and grafts: Secondary | ICD-10-CM

## 2019-09-30 DIAGNOSIS — I10 Essential (primary) hypertension: Secondary | ICD-10-CM | POA: Diagnosis not present

## 2019-09-30 DIAGNOSIS — Z5112 Encounter for antineoplastic immunotherapy: Secondary | ICD-10-CM | POA: Diagnosis not present

## 2019-09-30 DIAGNOSIS — Z79899 Other long term (current) drug therapy: Secondary | ICD-10-CM | POA: Diagnosis not present

## 2019-09-30 LAB — CBC WITH DIFFERENTIAL/PLATELET
Abs Immature Granulocytes: 0.02 10*3/uL (ref 0.00–0.07)
Basophils Absolute: 0 10*3/uL (ref 0.0–0.1)
Basophils Relative: 1 %
Eosinophils Absolute: 0.3 10*3/uL (ref 0.0–0.5)
Eosinophils Relative: 5 %
HCT: 36.2 % (ref 36.0–46.0)
Hemoglobin: 11.9 g/dL — ABNORMAL LOW (ref 12.0–15.0)
Immature Granulocytes: 0 %
Lymphocytes Relative: 20 %
Lymphs Abs: 1.2 10*3/uL (ref 0.7–4.0)
MCH: 28.9 pg (ref 26.0–34.0)
MCHC: 32.9 g/dL (ref 30.0–36.0)
MCV: 87.9 fL (ref 80.0–100.0)
Monocytes Absolute: 0.6 10*3/uL (ref 0.1–1.0)
Monocytes Relative: 10 %
Neutro Abs: 4 10*3/uL (ref 1.7–7.7)
Neutrophils Relative %: 64 %
Platelets: 190 10*3/uL (ref 150–400)
RBC: 4.12 MIL/uL (ref 3.87–5.11)
RDW: 12.8 % (ref 11.5–15.5)
WBC: 6.2 10*3/uL (ref 4.0–10.5)
nRBC: 0 % (ref 0.0–0.2)

## 2019-09-30 LAB — COMPREHENSIVE METABOLIC PANEL
ALT: 21 U/L (ref 0–44)
AST: 22 U/L (ref 15–41)
Albumin: 3.6 g/dL (ref 3.5–5.0)
Alkaline Phosphatase: 88 U/L (ref 38–126)
Anion gap: 9 (ref 5–15)
BUN: 15 mg/dL (ref 8–23)
CO2: 28 mmol/L (ref 22–32)
Calcium: 8.6 mg/dL — ABNORMAL LOW (ref 8.9–10.3)
Chloride: 105 mmol/L (ref 98–111)
Creatinine, Ser: 0.83 mg/dL (ref 0.44–1.00)
GFR calc Af Amer: >60 mL/min (ref >60)
GFR calc non Af Amer: >60 mL/min (ref >60)
Glucose, Bld: 110 mg/dL — ABNORMAL HIGH (ref 70–99)
Potassium: 3.8 mmol/L (ref 3.5–5.1)
Sodium: 142 mmol/L (ref 135–145)
Total Bilirubin: <0.2 mg/dL — ABNORMAL LOW (ref 0.3–1.2)
Total Protein: 6.3 g/dL — ABNORMAL LOW (ref 6.5–8.1)

## 2019-09-30 MED ORDER — DIPHENHYDRAMINE HCL 25 MG PO CAPS
25.0000 mg | ORAL_CAPSULE | Freq: Once | ORAL | Status: AC
  Administered 2019-09-30: 25 mg via ORAL

## 2019-09-30 MED ORDER — SODIUM CHLORIDE 0.9% FLUSH
10.0000 mL | INTRAVENOUS | Status: DC | PRN
  Administered 2019-09-30: 10 mL
  Filled 2019-09-30: qty 10

## 2019-09-30 MED ORDER — SODIUM CHLORIDE 0.9 % IV SOLN
Freq: Once | INTRAVENOUS | Status: AC
  Filled 2019-09-30: qty 250

## 2019-09-30 MED ORDER — DIPHENHYDRAMINE HCL 25 MG PO CAPS
ORAL_CAPSULE | ORAL | Status: AC
  Filled 2019-09-30: qty 1

## 2019-09-30 MED ORDER — ACETAMINOPHEN 325 MG PO TABS
ORAL_TABLET | ORAL | Status: AC
  Filled 2019-09-30: qty 2

## 2019-09-30 MED ORDER — HEPARIN SOD (PORK) LOCK FLUSH 100 UNIT/ML IV SOLN
500.0000 [IU] | Freq: Once | INTRAVENOUS | Status: AC | PRN
  Administered 2019-09-30: 500 [IU]
  Filled 2019-09-30: qty 5

## 2019-09-30 MED ORDER — ACETAMINOPHEN 325 MG PO TABS
650.0000 mg | ORAL_TABLET | Freq: Once | ORAL | Status: AC
  Administered 2019-09-30: 650 mg via ORAL

## 2019-09-30 MED ORDER — TRASTUZUMAB-ANNS CHEMO 150 MG IV SOLR
600.0000 mg | Freq: Once | INTRAVENOUS | Status: AC
  Administered 2019-09-30: 600 mg via INTRAVENOUS
  Filled 2019-09-30: qty 28.57

## 2019-09-30 NOTE — Progress Notes (Signed)
[facility]  [phone] [phone]    ID: Tiffany Montes DOB: Jan 13, 1956  MR#: [id number]  ZSW#:[id number]  Patient Care Team: Eulas Post, MD as PCP - General Fanny Skates, MD as Consulting Physician (General Surgery) Magrinat, Virgie Dad, MD as Consulting Physician (Oncology) Kyung Rudd, MD as Consulting Physician (Radiation Oncology) Maisie Fus, MD as Consulting Physician (Obstetrics and Gynecology) Larey Dresser, MD as Consulting Physician (Cardiology) OTHER MD:    CHIEF COMPLAINT: Estrogen receptor positive breast cancer, HER-2 amplified  CURRENT TREATMENT: Adjuvant trastuzumab, anastrozole   INTERVAL HISTORY: Tiffany Montes returns today for follow-up and treatment of her estrogen receptor positive and HER2 amplified breast cancer.    She has been started on anastrozole and is tolerating that with no significant issues.  She has a bone density scheduled in 10/2019.   She receives Trastuzumab every 3 weeks.  Her most recent echocardiogram was in 09/16/2019 and showed an EF of 60-65%.    Since her last visit, she underwent CT head, MRI spine, and xray of her hip to evaluate some areas of abnormality on a bone scan.  These were negative for metastatic disease.    REVIEW OF SYSTEMS: Tiffany Montes is doing moderately well.  She has continued to have some fatigue.  She denies any issues.  She is without fever, chills, chest pain, palpitations, cough, shortness of breath,bowel/bladder changes, nausea, vomiting, pain, or any other concerns.  A detailed ROS Was non contributory today.    HISTORY OF CURRENT ILLNESS: From the original intake note:  "Tiffany Montes" had routine screening mammography on 07/22/2018 showing a possible abnormality in the left breast. She underwent unilateral diagnostic mammography with tomography and left breast ultrasonography at [facility] on 07/28/2018 showing: Breast density Category C; a 7 x 8 x 8 mm hypoechoic mass in  the left breast lower inner quadrant, consistent with a complex cyst.. No abnormal lymph nodes in the left axilla.  Accordingly on 07/31/2018 she proceeded to biopsy of the left breast area in question. The pathology from this procedure showed ([id number]): Invasive Ductal Carcinoma, Grade 3. Prognostic indicators significant for: estrogen receptor, 95% positive and progesterone receptor, 85% positive, both with strong staining intensity. Proliferation marker Ki67 at 75%. HER2 positive by immunohistochemistry, 3+.   The patient's subsequent history is as detailed below.   PAST MEDICAL HISTORY: Past Medical History:  Diagnosis Date  . Anxiety   . Attention deficit disorder   . Breast cancer ([place]) 2019   Left Breast Cancer  . Depression   . Gallstones 09/05/2011  . Hyperlipidemia   . Hypertension   . Hypothyroidism   . Migraines   . Personal history of chemotherapy 2019   Left Breast Cancer  . Personal history of radiation therapy 2019   Left Breast Cancer  . PONV (postoperative nausea and vomiting)    diffficulty waking up  . Thyroid disease    hypothyroid    PAST SURGICAL HISTORY: Past Surgical History:  Procedure Laterality Date  . ABDOMINAL HYSTERECTOMY  2003  . BREAST BIOPSY Left 2014   benign  . BREAST LUMPECTOMY Left 09/07/2018  . BREAST LUMPECTOMY WITH RADIOACTIVE SEED AND SENTINEL LYMPH NODE BIOPSY Left 09/07/2018   Procedure: LEFT BREAST LUMPECTOMY WITH RADIOACTIVE SEED AND AXILLARY SENTINEL LYMPH NODE BIOPSY,INJECT BLUE DYE LEFT BREAST;  Surgeon: Fanny Skates, MD;  Location: [facility];  Service: General;  Laterality: Left;  . CHOLECYSTECTOMY  09/26/2011   Procedure: LAPAROSCOPIC CHOLECYSTECTOMY WITH INTRAOPERATIVE CHOLANGIOGRAM;  Surgeon: Autumn [name]  Streck, MD;  Location: [facility];  Service: General;  Laterality: N/A;  . COLONOSCOPY    . CYSTIC HYGROMA EXCISION    . DENTAL SURGERY     skin graft from top of mouth to lower gums  . PORTACATH PLACEMENT N/A 09/07/2018    Procedure: INSERTION PORT-A-CATH WITH [place];  Surgeon: Fanny Skates, MD;  Location: [facility];  Service: General;  Laterality: N/A;    FAMILY HISTORY: Family History  Problem Relation Age of Onset  . Diabetes Father   . Heart disease Father   . Lung cancer Father   . Breast cancer Neg Hx   . Ovarian cancer Neg Hx    She notes that her father died from CHF at age 53.  He was diagnosed with lung cancer at age 57. Patients' mother is 70 years old as of November 2019. The patient has 1 brother and 2 sisters. Patient denies a family history of ovarian or breast cancer.   GYNECOLOGIC HISTORY:  No LMP recorded. Patient has had a hysterectomy. 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 Job is very stressful and schedule is  She is divorced and lives by herself with her 2 dogs. Her daughter [name] is a Dealer in [place], [name].  The patient's son, [name] is a Training and development officer and lives in [place]. The patient has 2 grandchildren and 1 great-grand child.  She is not a church attender  ADVANCED DIRECTIVES: Her [place] is her daughter, Tiffany Montes, [phone].     HEALTH MAINTENANCE: Social History   Tobacco Use  . Smoking status: Never Smoker  . Smokeless tobacco: Never Used  Substance Use Topics  . Alcohol use: Yes    Comment: 1-2 a week and reports not every week  . Drug use: No    Colonoscopy: 05/2019, Dr. Earlean Shawl, 1 adenoma  PAP: 1 month ago  Bone density: Yes, 2 years ago   Allergies  Allergen Reactions  . Morphine Sulfate Nausea And Vomiting and Rash    GI upset    Current Outpatient Medications  Medication Sig Dispense Refill  . amphetamine-dextroamphetamine (ADDERALL) 30 MG tablet Take 1 tablet by  mouth daily. 30 tablet 0  . anastrozole (ARIMIDEX) 1 MG tablet Take 1 tablet (1 mg total) by mouth daily. 90 tablet 4  . augmented betamethasone dipropionate (DIPROLENE-AF) 0.05 % cream Apply 1 application topically 2 (two) times daily as needed (for eczema). 30 g 2  . benzonatate (TESSALON) 200 MG capsule TAKE 1 CAPSULE BY MOUTH AS NEEDED FOR ALLERGIES (COUGH) 30 capsule 0  . BIOTIN PO Take 1 tablet by mouth daily.    Marland Kitchen buPROPion (WELLBUTRIN XL) 300 MG 24 hr tablet TAKE 1 TABLET BY MOUTH EVERY DAY 90 tablet 1  . Cholecalciferol (VITAMIN D3 PO) Take 1 tablet by mouth daily.    . clonazePAM (KLONOPIN) 0.5 MG tablet TAKE 1 TABLET BY MOUTH EVERY DAY AT BEDTIME AS NEEDED 30 tablet 5  . fluticasone (FLONASE) 50 MCG/ACT nasal spray USE 2 SPRAYS IN EACH NOSTRIL DAILY AS NEEDED FOR ALLERGIES 48 mL 0  . gabapentin (NEURONTIN) 300 MG capsule Take 1 capsule (300 mg total) by mouth at bedtime. 90 capsule 4  . levothyroxine (SYNTHROID) 125 MCG tablet TAKE  1 TABLET BY MOUTH EVERY DAY 90 tablet 1  . losartan-hydrochlorothiazide (HYZAAR) 100-12.5 MG tablet Take 1 tablet by mouth daily. 90 tablet 3  . Multiple Vitamins-Minerals (MULTIVITAMINS THER. W/MINERALS) TABS Take 1 tablet by mouth daily.      . rosuvastatin (CRESTOR) 20 MG tablet TAKE 1 TABLET BY MOUTH EVERY DAY 90 tablet 0  . sertraline (ZOLOFT) 100 MG tablet Take 1 tablet (100 mg total) by mouth daily. 90 tablet 3  . SUMAtriptan (IMITREX) 100 MG tablet TAKE 1 TABLET BY MOUTH DAILY AS NEEDED FOR MIGRAINE. MAY REPEAT IN 24 HOURS. 9 tablet 2  . diazepam (VALIUM) 5 MG tablet Take 1 tablet (5 mg total) by mouth every 6 (six) hours as needed for anxiety. (Patient not taking: Reported on 09/30/2019) 5 tablet 0   No current facility-administered medications for this visit.   Facility-Administered Medications Ordered in Other Visits  Medication Dose Route Frequency Provider Last Rate Last Admin  . sodium chloride flush (NS) 0.9 % injection 10 mL  10 mL  Intracatheter PRN Magrinat, Virgie Dad, MD   10 mL at 09/30/19 0935    OBJECTIVE: Middle-aged white woman in no acute distress  Vitals:   09/30/19 0950  BP: (!) 150/76  Pulse: 80  Resp: 17  Temp: 97.8 F (36.6 C)  SpO2: 100%     Body mass index is 39.44 kg/m.   Wt Readings from Last 3 Encounters:  09/30/19 217 lb 6.4 oz (98.6 kg)  09/09/19 215 lb 8 oz (97.8 kg)  06/17/19 212 lb 11.2 oz (96.5 kg)  ECOG FS:1 GENERAL: Patient is a well appearing female in no acute distress HEENT:  Sclerae anicteric.  Oropharynx clear and moist. No ulcerations or evidence of oropharyngeal candidiasis. Neck is supple.  NODES:  No cervical, supraclavicular, or axillary lymphadenopathy palpated.  BREAST EXAM:  Right breast is benign, left breast s/p lumpectomy, no sign of local recurrence LUNGS:  Clear to auscultation bilaterally.  No wheezes or rhonchi. HEART:  Regular rate and rhythm. No murmur appreciated. ABDOMEN:  Soft, nontender.  Positive, normoactive bowel sounds. No organomegaly palpated. MSK:  No focal spinal tenderness to palpation. Full range of motion bilaterally in the upper extremities. EXTREMITIES:  No peripheral edema.   SKIN:  Clear with no obvious rashes or skin changes. No nail dyscrasia. NEURO:  Nonfocal. Well oriented.  Appropriate affect.   LAB RESULTS:  CMP     Component Value Date/Time   NA 141 09/09/2019 0819   K 3.9 09/09/2019 0819   CL 105 09/09/2019 0819   CO2 25 09/09/2019 0819   GLUCOSE 111 (H) 09/09/2019 0819   BUN 20 09/09/2019 0819   CREATININE 1.00 09/09/2019 0819   CREATININE 0.86 01/22/2019 0915   CALCIUM 9.1 09/09/2019 0819   PROT 6.7 09/09/2019 0819   ALBUMIN 3.8 09/09/2019 0819   AST 19 09/09/2019 0819   AST 20 01/22/2019 0915   ALT 21 09/09/2019 0819   ALT 22 01/22/2019 0915   ALKPHOS 93 09/09/2019 0819   BILITOT <0.2 (L) 09/09/2019 0819   BILITOT 0.3 01/22/2019 0915   GFRNONAA 59 (L) 09/09/2019 0819   GFRNONAA >60 01/22/2019 0915   GFRAA >60  09/09/2019 0819   GFRAA >60 01/22/2019 0915    No results found for: TOTALPROTELP, ALBUMINELP, A1GS, A2GS, BETS, BETA2SER, GAMS, MSPIKE, SPEI  No results found for: KPAFRELGTCHN, LAMBDASER, KAPLAMBRATIO  Lab Results  Component Value Date   WBC 6.2 09/30/2019   NEUTROABS 4.0 09/30/2019   HGB 11.9 (  L) 09/30/2019   HCT 36.2 09/30/2019   MCV 87.9 09/30/2019   PLT 190 09/30/2019    _0 @  No results found for: LABCA2  No components found for: [id number]  No results for input(s): INR in the last 168 hours.  No results found for: LABCA2  No results found for: IFO277  No results found for: AJO878  No results found for: MVE720  No results found for: CA2729  No components found for: HGQUANT  No results found for: CEA1 / No results found for: CEA1   No results found for: AFPTUMOR  No results found for: CHROMOGRNA  No results found for: PSA1  Appointment on 09/30/2019  Component Date Value Ref Range Status  . WBC 09/30/2019 6.2  4.0 - 10.5 K/uL Final  . RBC 09/30/2019 4.12  3.87 - 5.11 MIL/uL Final  . Hemoglobin 09/30/2019 11.9* 12.0 - 15.0 g/dL Final  . HCT 09/30/2019 36.2  36.0 - 46.0 % Final  . MCV 09/30/2019 87.9  80.0 - 100.0 fL Final  . MCH 09/30/2019 28.9  26.0 - 34.0 pg Final  . MCHC 09/30/2019 32.9  30.0 - 36.0 g/dL Final  . RDW 09/30/2019 12.8  11.5 - 15.5 % Final  . Platelets 09/30/2019 190  150 - 400 K/uL Final  . nRBC 09/30/2019 0.0  0.0 - 0.2 % Final  . Neutrophils Relative % 09/30/2019 64  % Final  . Neutro Abs 09/30/2019 4.0  1.7 - 7.7 K/uL Final  . Lymphocytes Relative 09/30/2019 20  % Final  . Lymphs Abs 09/30/2019 1.2  0.7 - 4.0 K/uL Final  . Monocytes Relative 09/30/2019 10  % Final  . Monocytes Absolute 09/30/2019 0.6  0.1 - 1.0 K/uL Final  . Eosinophils Relative 09/30/2019 5  % Final  . Eosinophils Absolute 09/30/2019 0.3  0.0 - 0.5 K/uL Final  . Basophils Relative 09/30/2019 1  % Final  . Basophils Absolute 09/30/2019 0.0  0.0 -  0.1 K/uL Final  . Immature Granulocytes 09/30/2019 0  % Final  . Abs Immature Granulocytes 09/30/2019 0.02  0.00 - 0.07 K/uL Final   Performed at [facility], [address] [place], [place] [address]    (this displays the last labs from the last 3 days)  No results found for: TOTALPROTELP, ALBUMINELP, A1GS, A2GS, BETS, BETA2SER, GAMS, MSPIKE, SPEI (this displays SPEP labs)  No results found for: KPAFRELGTCHN, LAMBDASER, KAPLAMBRATIO (kappa/lambda light chains)  No results found for: HGBA, HGBA2QUANT, HGBFQUANT, HGBSQUAN (Hemoglobinopathy evaluation)   No results found for: LDH  No results found for: IRON, TIBC, IRONPCTSAT (Iron and TIBC)  No results found for: FERRITIN  Urinalysis    Component Value Date/Time   COLORURINE YELLOW 09/13/2011 [place] 09/13/2011 0952   LABSPEC 1.010 09/13/2011 0952   PHURINE 6.5 09/13/2011 [place] 09/13/2011 0952   HGBUR NEGATIVE 09/13/2011 0952   HGBUR negative 04/03/2009 0949   BILIRUBINUR n 09/15/2012 0832   KETONESUR NEGATIVE 09/13/2011 0952   PROTEINUR n 09/15/2012 0832   PROTEINUR NEGATIVE 09/13/2011 0952   UROBILINOGEN 0.2 09/15/2012 0832   UROBILINOGEN 0.2 09/13/2011 0952   NITRITE n 09/15/2012 0832   NITRITE NEGATIVE 09/13/2011 0952   LEUKOCYTESUR Negative 09/15/2012 0832     STUDIES:  CT Head W Wo Contrast  Result Date: 09/11/2019 CLINICAL DATA:  Left breast cancer, correlate with bone scan EXAM: CT HEAD WITHOUT AND WITH CONTRAST TECHNIQUE: Contiguous axial images were obtained from the base of the  skull through the vertex without and with intravenous contrast CONTRAST:  42m OMNIPAQUE IOHEXOL 300 MG/ML  SOLN COMPARISON:  None. FINDINGS: Brain: No evidence of acute infarction, hemorrhage, hydrocephalus, extra-axial collection or mass lesion/mass effect. Mild subcortical white matter and periventricular small vessel ischemic changes. Vascular: No hyperdense vessel or  unexpected calcification. Visible vessels are patent. Skull: Normal. Negative for fracture or focal lesion. Specifically, no suspicious findings to correlate with recent bone scan. Sinuses/Orbits: No acute finding. Other: None. IMPRESSION: No evidence of acute intracranial abnormality. Mild small vessel ischemic changes. No findings suspicious for osseous metastases. Electronically Signed   By: SJulian HyM.D.   On: 09/11/2019 13:42   CT Chest W Contrast  Result Date: 09/10/2019 CLINICAL DATA:  Breast cancer. Staging. EXAM: CT CHEST WITH CONTRAST TECHNIQUE: Multidetector CT imaging of the chest was performed during intravenous contrast administration. CONTRAST:  77mOMNIPAQUE IOHEXOL 300 MG/ML  SOLN COMPARISON:  None. FINDINGS: Cardiovascular: The heart size appears within normal limits. No pericardial effusion identified. Aortic atherosclerosis lad coronary artery atherosclerotic calcifications. Mediastinum/Nodes: No enlarged mediastinal, hilar, or axillary lymph nodes. Thyroid gland, trachea, and esophagus demonstrate no significant findings. Lungs/Pleura: No pleural effusion. Subpleural reticulation and banding within the anterior left upper lobe is noted compatible with changes due to external beam radiation to the left chest wall. Tiny nonspecific perifissural nodule in the left lung measures 4 mm, image 53/9. 6 mm perifissural nodule in the right midlung is also noted, image 45/9. No suspicious pulmonary nodules. Upper Abdomen: No acute abnormality. Musculoskeletal: No acute or suspicious bone lesions identified. Postsurgical and post treatment changes involving the left breast noted. IMPRESSION: 1. No acute cardiopulmonary abnormalities. 2. No specific findings identified to suggest metastatic disease to the chest. 3. Aortic atherosclerosis. Coronary artery calcifications noted. Aortic Atherosclerosis (ICD10-I70.0). Electronically Signed   By: TaKerby Moors.D.   On: 09/10/2019 15:33    ECHOCARDIOGRAM COMPLETE  Result Date: 09/16/2019   ECHOCARDIOGRAM REPORT   Patient Name:   PAKASEE HANTZate of Exam: 09/16/2019 Medical Rec #:  [id number]       Height:       62.2 in Accession #:    [id number]      Weight:       215.5 lb Date of Birth:  02/1956/05/03        BSA:          1.98 m Patient Age:    [age]          BP:           148/86 mmHg Patient Gender: F                 HR:           75 bpm. Exam Location:  Outpatient Procedure: 2D Echo, Cardiac Doppler, Color Doppler, Strain Analysis and 3D Echo Indications:    Z51.11 Encounter for antineoplastic chemotheraphy  History:        Patient has prior history of Echocardiogram examinations, most                 recent 06/04/2019. Risk Factors:Hypertension and Dyslipidemia.                 Breast cancer.  Sonographer:    TiRoseanna Rainboweferring Phys: [id number] Comments: Technically difficult study due to poor echo windows, suboptimal subcostal window and patient is morbidly obese. Image acquisition challenging due to patient body  habitus. IMPRESSIONS  1. Left ventricular ejection fraction, by visual estimation, is 60 to 65%. The left ventricle has normal function. Left ventricular septal wall thickness was normal. There is moderately increased left ventricular hypertrophy.  2. The left ventricle has no regional wall motion abnormalities.  3. Global right ventricle has normal systolic function.The right ventricular size is normal. No increase in right ventricular wall thickness.  4. Left atrial size was normal.  5. Right atrial size was normal.  6. The mitral valve is normal in structure. No evidence of mitral valve regurgitation.  7. The tricuspid valve is normal in structure. Tricuspid valve regurgitation is mild.  8. The aortic valve is normal in structure. Aortic valve regurgitation is not visualized. No evidence of aortic valve sclerosis or stenosis.  9. The pulmonic valve was normal in structure. Pulmonic valve  regurgitation is not visualized. 10. The atrial septum is grossly normal. 11. The average left ventricular global longitudinal strain is -18.9 %. FINDINGS  Left Ventricle: Left ventricular ejection fraction, by visual estimation, is 60 to 65%. The left ventricle has normal function. The average left ventricular global longitudinal strain is -18.9 %. The left ventricle has no regional wall motion abnormalities. There is moderately increased left ventricular hypertrophy. Asymmetric left ventricular hypertrophy. Left ventricular diastolic parameters were normal. Right Ventricle: The right ventricular size is normal. No increase in right ventricular wall thickness. Global RV systolic function is has normal systolic function. Left Atrium: Left atrial size was normal in size. Right Atrium: Right atrial size was normal in size Pericardium: There is no evidence of pericardial effusion. Mitral Valve: The mitral valve is normal in structure. No evidence of mitral valve regurgitation. Tricuspid Valve: The tricuspid valve is normal in structure. Tricuspid valve regurgitation is mild. Aortic Valve: The aortic valve is normal in structure. Aortic valve regurgitation is not visualized. The aortic valve is structurally normal, with no evidence of sclerosis or stenosis. Pulmonic Valve: The pulmonic valve was normal in structure. Pulmonic valve regurgitation is not visualized. Pulmonic regurgitation is not visualized. Aorta: The aortic root and ascending aorta are structurally normal, with no evidence of dilitation. IAS/Shunts: The atrial septum is grossly normal.  LEFT VENTRICLE PLAX 2D LVIDd:         3.30 cm       Diastology LVIDs:         2.30 cm       LV e' lateral:   5.00 cm/s LV PW:         1.40 cm       LV E/e' lateral: 12.9 LV IVS:        1.70 cm       LV e' medial:    5.87 cm/s LVOT diam:     1.70 cm       LV E/e' medial:  11.0 LV SV:         26 ml LV SV Index:   12.25         2D Longitudinal Strain LVOT Area:     2.27 cm       2D Strain GLS Avg:     -18.9 %  LV Volumes (MOD) LV area d, A2C:    21.90 cm LV area d, A4C:    23.60 cm LV area s, A2C:    12.60 cm LV area s, A4C:    14.30 cm LV major d, A2C:   6.57 cm LV major d, A4C:   7.35 cm LV major s, A2C:  5.51 cm LV major s, A4C:   6.83 cm LV vol d, MOD A2C: 61.5 ml LV vol d, MOD A4C: 62.0 ml LV vol s, MOD A2C: 25.2 ml LV vol s, MOD A4C: 24.7 ml LV SV MOD A2C:     36.3 ml LV SV MOD A4C:     62.0 ml LV SV MOD BP:      37.4 ml RIGHT VENTRICLE RV S prime:     7.83 cm/s TAPSE (M-mode): 1.9 cm LEFT ATRIUM             Index       RIGHT ATRIUM           Index LA diam:        3.10 cm 1.57 cm/m  RA Area:     10.90 cm LA Vol (A2C):   25.8 ml 13.04 ml/m RA Volume:   18.60 ml  9.40 ml/m LA Vol (A4C):   32.3 ml 16.32 ml/m LA Biplane Vol: 29.2 ml 14.76 ml/m  AORTIC VALVE LVOT Vmax:   107.00 cm/s LVOT Vmean:  66.700 cm/s LVOT VTI:    0.196 m  AORTA Ao Root diam: 2.90 cm Ao Asc diam:  3.00 cm MITRAL VALVE MV Area (PHT): 4.49 cm             SHUNTS MV PHT:        49.01 msec           Systemic VTI:  0.20 m MV Decel Time: 169 msec             Systemic Diam: 1.70 cm MV E velocity: 64.70 cm/s 103 cm/s MV A velocity: 90.40 cm/s 70.3 cm/s MV E/A ratio:  0.72       1.5  Mertie Moores MD Electronically signed by Mertie Moores MD Signature Date/Time: 09/16/2019/4:12:55 PM    Final    MR TOTAL SPINE METS SCREENING  Result Date: 09/11/2019 CLINICAL DATA:  Initial evaluation for breast cancer, evaluate for possible metastatic disease. EXAM: MRI TOTAL SPINE WITHOUT AND WITH CONTRAST TECHNIQUE: Multisequence MR imaging of the spine from the cervical spine to the sacrum was performed prior to and following IV contrast administration for evaluation of spinal metastatic disease. CONTRAST:  47m GADAVIST GADOBUTROL 1 MMOL/ML IV SOLN COMPARISON:  Comparison made with previous bone scan from 08/30/2019. FINDINGS: MRI CERVICAL SPINE FINDINGS Alignment: Reversal of the normal cervical lordosis. Grade 1  anterolisthesis of C7 on T1, chronic and degenerative. Vertebrae: Vertebral body height maintained without evidence for acute or chronic fracture. Bone marrow signal intensity within normal limits. No discrete or worrisome osseous lesions. No abnormal marrow edema or enhancement. No evidence for spinal metastatic disease. No pathologic fracture or extraosseous tumor. Cord: Signal intensity within the cervical spinal cord is within normal limits. No epidural or intramedullary tumor. No abnormal enhancement. Posterior Fossa, vertebral arteries, paraspinal tissues: Mild chronic microvascular ischemic change noted within the visualized pons and brainstem. Visualized brain and posterior fossa otherwise unremarkable. Subcentimeter STIR hyperintensity at the posterior calvarium likely reflects an arachnoid pit. Craniocervical junction within normal limits. Paraspinous and prevertebral soft tissues within normal limits. Disc levels: Multilevel cervical spondylosis extending from C2-3 through C7-T1 with resultant moderate diffuse spinal stenosis. No frank cord impingement. Evaluation for foraminal narrowing limited due to lack of axial imaging. MRI THORACIC SPINE FINDINGS Alignment: Vertebral bodies normally aligned with preservation of the normal thoracic kyphosis. No listhesis. Vertebrae: Vertebral body height maintained without evidence for acute or chronic fracture. Bone marrow signal  intensity within normal limits. No discrete or worrisome osseous lesions. No abnormal marrow edema or enhancement. No evidence for metastatic disease. No pathologic fracture or extraosseous tumor. Cord: Signal intensity within the thoracic spinal cord is within normal limits. No intramedullary or epidural tumor. No abnormal enhancement. Paraspinal and other soft tissues: Paraspinous soft tissues within normal limits. Disc levels: Mild multilevel degenerative disc bulging seen throughout the thoracic spine with resultant no more than mild  stenosis. No cord impingement or high-grade stenosis. Bilateral facet hypertrophy noted at T11-12 with associated small joint effusions. MRI LUMBAR SPINE FINDINGS Segmentation: Standard. Lowest well-formed disc space labeled the L5-S1 level. Alignment: Physiologic with preservation of the normal lumbar lordosis. No listhesis. Vertebrae: Chronic compression fracture involving the superior endplate of L1 with up to approximate 50% height loss and 3 mm bony retropulsion. This is benign/mechanical in appearance. Vertebral body height otherwise maintained without evidence for acute or chronic fracture. Bone marrow signal intensity within normal limits. No discrete or worrisome osseous lesions. No abnormal marrow edema or enhancement. No metastatic disease. No extra osseous tumor. Conus medullaris: Extends to the L2 level and appears normal. No evidence for intramedullary or epidural tumor. No abnormal enhancement. Paraspinal and other soft tissues: Visualized paraspinous soft tissues within normal limits. Disc levels: Mild multilevel degenerative disc bulging noted at L1-2 through L4-5. Bilateral facet hypertrophy noted at L4-5 and L5-S1. Resultant mild spinal stenosis at the L4-5 level. Foramina remain patent. IMPRESSION: 1. No MRI evidence for metastatic disease within the cervical, thoracic, or lumbar spine. 2. Chronic L1 compression fracture with up to 50% height loss and 3 mm bony retropulsion. This is benign/mechanical in appearance. 3. Moderate multilevel degenerative spondylosis without severe high-grade spinal stenosis or cord impingement. Electronically Signed   By: Jeannine Boga M.D.   On: 09/11/2019 04:23   DG Hip Unilat W or W/O Pelvis 1 View Left  Result Date: 09/11/2019 CLINICAL DATA:  Left hip pain, breast cancer EXAM: DG HIP (WITH OR WITHOUT PELVIS) 1V*L* COMPARISON:  None. FINDINGS: No focal lesion is seen along the left greater trochanter to correspond to the bone scan abnormality. No  fracture or dislocation is seen. Bilateral hip joint spaces are preserved. Visualized bony pelvis appears intact. Again noted is a sclerotic lesion in the left iliac bone. This did not demonstrate uptake on recent bone scan and therefore may be benign. IMPRESSION: No focal lesion is seen along the left greater trochanter to correspond to the bone scan abnormality. Stable sclerotic lesion in the left iliac bone which did not demonstrated uptake on recent bone scan and therefore may be benign. Consider attention on follow-up. Electronically Signed   By: Julian Hy M.D.   On: 09/11/2019 13:29     ELIGIBLE FOR AVAILABLE RESEARCH PROTOCOL: no   ASSESSMENT: 63 y.o. [place] woman status post left breast lower inner quadrant biopsy 07/31/2018 for a clinical T1b N0, stage IA invasive ductal carcinoma, grade 3, triple positive, with an MIB-1 of 75%.  (1) status post left lumpectomy and sentinel lymph node sampling 09/07/2018 for a pT1c pN0, stage IA invasive ductal carcinoma, grade 3, with negative margins.  (a) a total of 5 sentinel lymph nodes were removed  (2) adjuvant chemotherapy consisting of paclitaxel weekly x12 with trastuzumab every 21 days starting 10/02/2018, completed 12/25/2018  (3) trastuzumab to be continued to complete 1 year (through December 2020).  (a) echocardiogram 08/17/2018 shows an ejection fraction of 60-65%  (b) echocardiogram on 11/17/2018 shows an ejection fraction of 60-65%  (  c) echo 02/08/2019 shows an ejection fraction in the 60-65%  (d) Echo in 05/2019 shows EF of 60-65%  (e) Echo on 09/16/2019 shows EF of 60-65%  (4) adjuvant radiation 02/04/2019 - 03/04/2019  (a) Left breast treated to 42.56 Gy in 16 fractions  (b) Seroma boost of 8 Gy in 4 fractions, for a total of 50.56 Gy  (5) started anastrozole 04/11/2019  (a) bone density scan scheduled for 10/2019   PLAN:  Tiffany Montes continues to do quite well.  She has no signs of breast cancer recurrence.  She will  complete trastuzumab today.  She has tolerated this well and her most recent echocardiogram was normal.  She can have her port removed.  Her surgeon was Dr. Dalbert Batman, who retired.  I sent a message to Bary Castilla, our breast navigator to get her in with a different surgeon to have this removed.  I let Tiffany Montes know that we will reach out to her next week about this.  Tiffany Montes continues on Anastrozole therapy with good tolerance.  She will continue this for at least 5 years.  She is going to have bone density completed next month and we reviewed the importance of calcium, vitamin d and weight bearing exercises.    We discussed weight loss and exercise to help reduce her risk for breast cancer recurrence.    I let Tiffany Montes know that she can receive the COVID vaccine, however I have no information about what Tiffany Montes's plan is for administration to patients along with risk stratification at this point.    Tiffany Montes will return in 1 month for SCP visit and in 6 months for labs and f/u with Dr. Jana Hakim.  She was recommended to continue with the appropriate pandemic precautions. She knows to call for any questions that may arise between now and her next appointment.  We are happy to see her sooner if needed.  A total of (25) minutes of face-to-face time was spent with this patient with greater than 50% of that time in counseling and care-coordination.  Wilber Bihari, NP  09/30/19 10:02 AM Medical Oncology and Hematology [facility] [address] [place], [place] [address] Tel. [phone]    Fax. [phone]

## 2019-09-30 NOTE — Patient Instructions (Signed)
[facility] Discharge Instructions for Patients Receiving Chemotherapy  Today you received the following chemotherapy agents trastuzumab.  To help prevent nausea and vomiting after your treatment, we encourage you to take your nausea medication as directed.    If you develop nausea and vomiting that is not controlled by your nausea medication, call the clinic.   BELOW ARE SYMPTOMS THAT SHOULD BE REPORTED IMMEDIATELY:  *FEVER GREATER THAN 100.5 F  *CHILLS WITH OR WITHOUT FEVER  NAUSEA AND VOMITING THAT IS NOT CONTROLLED WITH YOUR NAUSEA MEDICATION  *UNUSUAL SHORTNESS OF BREATH  *UNUSUAL BRUISING OR BLEEDING  TENDERNESS IN MOUTH AND THROAT WITH OR WITHOUT PRESENCE OF ULCERS  *URINARY PROBLEMS  *BOWEL PROBLEMS  UNUSUAL RASH Items with * indicate a potential emergency and should be followed up as soon as possible.  Feel free to call the clinic should you have any questions or concerns. The clinic phone number is [phone].  Please show the CHEMO ALERT CARD at check-in to the Emergency Department and triage nurse.   

## 2019-10-04 ENCOUNTER — Encounter: Payer: Self-pay | Admitting: *Deleted

## 2019-10-06 DIAGNOSIS — H35033 Hypertensive retinopathy, bilateral: Secondary | ICD-10-CM | POA: Diagnosis not present

## 2019-10-06 DIAGNOSIS — H3530 Unspecified macular degeneration: Secondary | ICD-10-CM | POA: Diagnosis not present

## 2019-10-10 ENCOUNTER — Other Ambulatory Visit: Payer: Self-pay | Admitting: Family Medicine

## 2019-10-14 ENCOUNTER — Telehealth: Payer: Self-pay | Admitting: Family Medicine

## 2019-10-14 NOTE — Telephone Encounter (Signed)
Copied from [place] [phone]. Topic: General - Other >> Oct 14, 2019  1:48 PM Keene Breath wrote: Reason for CRM: Patient would like the nurse to call to discuss her amphetamine-dextroamphetamine (ADDERALL) 30 MG tablet medication.  CB# [phone]

## 2019-10-14 NOTE — Telephone Encounter (Signed)
Message Routed to PCP CMA 

## 2019-10-15 NOTE — Telephone Encounter (Signed)
We have switched this multiple times.  Set up Doxy for next week.

## 2019-10-15 NOTE — Telephone Encounter (Signed)
Called patient and set up Doxy for Monday, 10/17/18. Patient verbalized an understanding.

## 2019-10-15 NOTE — Telephone Encounter (Signed)
Called patient and she stated that this prescription is not working well for her at all. Patient states she is not herself. She is requesting to go back on the extended release form please. Send to [facility] on [address].

## 2019-10-18 ENCOUNTER — Other Ambulatory Visit: Payer: Self-pay

## 2019-10-18 ENCOUNTER — Telehealth (INDEPENDENT_AMBULATORY_CARE_PROVIDER_SITE_OTHER): Payer: BC Managed Care – PPO | Admitting: Family Medicine

## 2019-10-18 DIAGNOSIS — F988 Other specified behavioral and emotional disorders with onset usually occurring in childhood and adolescence: Secondary | ICD-10-CM

## 2019-10-18 DIAGNOSIS — E785 Hyperlipidemia, unspecified: Secondary | ICD-10-CM | POA: Diagnosis not present

## 2019-10-18 MED ORDER — AMPHETAMINE-DEXTROAMPHET ER 30 MG PO CP24: ORAL_CAPSULE | ORAL | 0 refills | Status: DC

## 2019-10-18 MED ORDER — AMPHETAMINE-DEXTROAMPHET ER 30 MG PO CP24: 30.0000 mg | ORAL_CAPSULE | ORAL | 0 refills | Status: DC

## 2019-10-18 NOTE — Progress Notes (Signed)
This visit type was conducted due to national recommendations for restrictions regarding the COVID-19 pandemic in an effort to limit this patient's exposure and mitigate transmission in our community.   Virtual Visit via Telephone Note  I connected with Tiffany Montes on 10/18/19 at 11:15 AM EST by telephone and verified that I am speaking with the correct person using two identifiers.   I discussed the limitations, risks, security and privacy concerns of performing an evaluation and management service by telephone and the availability of in person appointments. I also discussed with the patient that there may be a patient responsible charge related to this service. The patient expressed understanding and agreed to proceed.  Location patient: home Location provider: work or home office Participants present for the call: patient, provider Patient did not have a visit in the prior 7 days to address this/these issue(s).   History of Present Illness: [name] had called last week about possibly switching her stimulant medication.  She has longstanding history of adult attention deficit disorder.  We have made multiple changes over the years.  She had requested immediate release once daily.  We switched to this at her request but then she states that she was not getting good coverage throughout the 8 to 10 hours that she needs coverage.  She has taken Adderall XR in the past has done generally well with that dosage.  She had requested months ago when she is going through chemotherapy and radiation for breast cancer switching to immediate release but in hindsight she feels like the extended release works better for her.  She is looking at going back to work at the end of February and would like to get stable on her medication before she goes back.  Generally feels well.  No consistent exercise.  History of very high lipids.  We reviewed her lipids from July.  She was not consistently taking her statin  medication then.  She states she is back on Crestor now.   Observations/Objective: Patient sounds cheerful and well on the phone. I do not appreciate any SOB. Speech and thought processing are grossly intact. Patient reported vitals:  Assessment and Plan:  #1 attention deficit disorder -Change back to Adderall XR 30 mg once daily with 3 months refills given -Set up a 72-month follow-up  #2 hyperlipidemia -Continue Crestor and repeat fasting lipids at follow-up in 3 months  Follow Up Instructions:  -3 months   99441 5-10 99442 11-20 99443 21-30 I did not refer this patient for an OV in the next 24 hours for this/these issue(s).  I discussed the assessment and treatment plan with the patient. The patient was provided an opportunity to ask questions and all were answered. The patient agreed with the plan and demonstrated an understanding of the instructions.   The patient was advised to call back or seek an in-person evaluation if the symptoms worsen or if the condition fails to improve as anticipated.  I provided 22 minutes of non-face-to-face time during this encounter.   Carolann Littler, MD

## 2019-10-20 ENCOUNTER — Other Ambulatory Visit: Payer: Self-pay | Admitting: Family Medicine

## 2019-10-24 ENCOUNTER — Other Ambulatory Visit: Payer: Self-pay | Admitting: Family Medicine

## 2019-10-27 NOTE — Telephone Encounter (Signed)
No entry 

## 2019-10-29 ENCOUNTER — Encounter (HOSPITAL_BASED_OUTPATIENT_CLINIC_OR_DEPARTMENT_OTHER): Payer: Self-pay | Admitting: Surgery

## 2019-10-29 ENCOUNTER — Other Ambulatory Visit: Payer: Self-pay

## 2019-10-29 ENCOUNTER — Ambulatory Visit: Payer: Self-pay | Admitting: Surgery

## 2019-10-29 DIAGNOSIS — C50212 Malignant neoplasm of upper-inner quadrant of left female breast: Secondary | ICD-10-CM | POA: Diagnosis not present

## 2019-10-29 NOTE — H&P (Signed)
History of Present Illness (Iverson Sees K. Aedan Geimer MD; 10/29/2019 11:04 AM) The patient is a 63 year old female who presents with breast cancer. This is a former patient of Dr. Ingram. Oncology Dr. Magrinat  On September 07, 2018 she underwent Port-A-Cath insertion, left breast lumpectomy upper inner quadrant, sentinel node biopsy. 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 Negative margins. All 5 sentinel nodes are negative. Receptor strongly positive. HER-2 positive. Ki-67 75% She is doing very well. Has no complaints about the port site, breast, arm or shoulder. She has completed her course of Herceptin and is ready to have her port removed. She remains on anastrozole. She completed radiation in May 2020.   Medication History (Armen Ferguson, CMA; 10/29/2019 10:36 AM) Multivitamin (Oral) Active. Sertraline HCl (100MG Tablet, Oral) Active. Rosuvastatin Calcium (20MG Tablet, Oral) Active. Betamethasone Dipropionate Aug (0.05% Cream, External) Active. Fluticasone Propionate (50MCG/ACT Suspension, Nasal) Active. Wellbutrin XL (300MG Tablet ER 24HR, Oral) Active. KlonoPIN (0.5MG Tablet, Oral) Active. Estrace (1MG Tablet, Oral) Active. Flonase (50MCG/DOSE Inhaler, Nasal) Active. Synthroid (125MCG Tablet, Oral) Active. Hyzaar (50-12.5MG Tablet, Oral) Active. Zoloft (50MG Tablet, Oral) Active. Imitrex (100MG Tablet, Oral) Active. Medications Reconciled    Vitals (Armen Ferguson CMA; 10/29/2019 10:34 AM) 10/29/2019 10:34 AM Weight: 218.25 lb Height: 62in Body Surface Area: 1.98 m Body Mass Index: 39.92 kg/m  Temp.: 97.3F  Pulse: 101 (Regular)  P.OX: 95% (Room air) BP: 138/90 (Sitting, Left Arm, Standard)        Physical Exam (Braxson Hollingsworth K. Eddith Mentor MD; 10/29/2019 11:05 AM)  The physical exam findings are as follows: Note:WDWN in NAD Eyes: Pupils equal, round; sclera anicteric HENT: Oral mucosa moist; good dentition Neck: No  masses palpated, no thyromegaly Right chest port site is well-healed with no sign of infection. No seroma. The catheter tracks up into her neck and enters her internal jugular vein. Lungs: CTA bilaterally; normal respiratory effort CV: Regular rate and rhythm; no murmurs; extremities well-perfused with no edema Abd: +bowel sounds, soft, non-tender, no palpable organomegaly; no palpable hernias Skin: Warm, dry; no sign of jaundice Psychiatric - alert and oriented x 4; calm mood and affect    Assessment & Plan (Zeynab Klett K. Lucrecia Mcphearson MD; 10/29/2019 10:43 AM)  PRIMARY CANCER OF UPPER INNER QUADRANT OF LEFT FEMALE BREAST (C50.212)  Current Plans Schedule for Surgery - Port removal. The surgical procedure has been discussed with the patient. Potential risks, benefits, alternative treatments, and expected outcomes have been explained. All of the patient's questions at this time have been answered. The likelihood of reaching the patient's treatment goal is good. The patient understand the proposed surgical procedure and wishes to proceed.  Marsa Matteo K. Tobie Perdue, MD, FACS [facility]  General/ Trauma Surgery   10/29/2019 11:05 AM   

## 2019-10-29 NOTE — H&P (View-Only) (Signed)
History of Present Illness ( K.  MD; 10/29/2019 11:04 AM) The patient is a 63 year old female who presents with breast cancer. This is a former patient of Dr. Ingram. Oncology Dr. Magrinat  On September 07, 2018 she underwent Port-A-Cath insertion, left breast lumpectomy upper inner quadrant, sentinel node biopsy. 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 Negative margins. All 5 sentinel nodes are negative. Receptor strongly positive. HER-2 positive. Ki-67 75% She is doing very well. Has no complaints about the port site, breast, arm or shoulder. She has completed her course of Herceptin and is ready to have her port removed. She remains on anastrozole. She completed radiation in May 2020.   Medication History (Armen Ferguson, CMA; 10/29/2019 10:36 AM) Multivitamin (Oral) Active. Sertraline HCl (100MG Tablet, Oral) Active. Rosuvastatin Calcium (20MG Tablet, Oral) Active. Betamethasone Dipropionate Aug (0.05% Cream, External) Active. Fluticasone Propionate (50MCG/ACT Suspension, Nasal) Active. Wellbutrin XL (300MG Tablet ER 24HR, Oral) Active. KlonoPIN (0.5MG Tablet, Oral) Active. Estrace (1MG Tablet, Oral) Active. Flonase (50MCG/DOSE Inhaler, Nasal) Active. Synthroid (125MCG Tablet, Oral) Active. Hyzaar (50-12.5MG Tablet, Oral) Active. Zoloft (50MG Tablet, Oral) Active. Imitrex (100MG Tablet, Oral) Active. Medications Reconciled    Vitals (Armen Ferguson CMA; 10/29/2019 10:34 AM) 10/29/2019 10:34 AM Weight: 218.25 lb Height: 62in Body Surface Area: 1.98 m Body Mass Index: 39.92 kg/m  Temp.: 97.3F  Pulse: 101 (Regular)  P.OX: 95% (Room air) BP: 138/90 (Sitting, Left Arm, Standard)        Physical Exam ( K.  MD; 10/29/2019 11:05 AM)  The physical exam findings are as follows: Note:WDWN in NAD Eyes: Pupils equal, round; sclera anicteric HENT: Oral mucosa moist; good dentition Neck: No  masses palpated, no thyromegaly Right chest port site is well-healed with no sign of infection. No seroma. The catheter tracks up into her neck and enters her internal jugular vein. Lungs: CTA bilaterally; normal respiratory effort CV: Regular rate and rhythm; no murmurs; extremities well-perfused with no edema Abd: +bowel sounds, soft, non-tender, no palpable organomegaly; no palpable hernias Skin: Warm, dry; no sign of jaundice Psychiatric - alert and oriented x 4; calm mood and affect    Assessment & Plan ( K.  MD; 10/29/2019 10:43 AM)  PRIMARY CANCER OF UPPER INNER QUADRANT OF LEFT FEMALE BREAST (C50.212)  Current Plans Schedule for Surgery - Port removal. The surgical procedure has been discussed with the patient. Potential risks, benefits, alternative treatments, and expected outcomes have been explained. All of the patient's questions at this time have been answered. The likelihood of reaching the patient's treatment goal is good. The patient understand the proposed surgical procedure and wishes to proceed.   K. , MD, FACS [facility]  General/ Trauma Surgery   10/29/2019 11:05 AM   

## 2019-11-01 ENCOUNTER — Encounter (HOSPITAL_BASED_OUTPATIENT_CLINIC_OR_DEPARTMENT_OTHER)
Admission: RE | Admit: 2019-11-01 | Discharge: 2019-11-01 | Disposition: A | Discharge status: Home / Self Care | Payer: BC Managed Care – PPO | Source: Ambulatory Visit | Attending: Surgery | Admitting: Surgery

## 2019-11-01 ENCOUNTER — Other Ambulatory Visit: Payer: Self-pay

## 2019-11-01 ENCOUNTER — Other Ambulatory Visit (HOSPITAL_COMMUNITY)
Admission: RE | Admit: 2019-11-01 | Discharge: 2019-11-01 | Disposition: A | Discharge status: Home / Self Care | Payer: BC Managed Care – PPO | Source: Ambulatory Visit | Attending: Surgery | Admitting: Surgery

## 2019-11-01 DIAGNOSIS — Z01812 Encounter for preprocedural laboratory examination: Secondary | ICD-10-CM | POA: Diagnosis not present

## 2019-11-01 DIAGNOSIS — I10 Essential (primary) hypertension: Secondary | ICD-10-CM | POA: Diagnosis not present

## 2019-11-01 DIAGNOSIS — Z20822 Contact with and (suspected) exposure to covid-19: Secondary | ICD-10-CM | POA: Diagnosis not present

## 2019-11-01 LAB — SARS CORONAVIRUS 2 (TAT 6-24 HRS): SARS Coronavirus 2: NEGATIVE

## 2019-11-01 NOTE — Progress Notes (Signed)
Surgical soap given with instructions, pt verbalized understanding.  

## 2019-11-03 ENCOUNTER — Inpatient Hospital Stay: Payer: BC Managed Care – PPO | Attending: Oncology | Admitting: Adult Health

## 2019-11-03 DIAGNOSIS — C50212 Malignant neoplasm of upper-inner quadrant of left female breast: Secondary | ICD-10-CM

## 2019-11-03 DIAGNOSIS — Z170 Estrogen receptor positive status [ER+]: Secondary | ICD-10-CM | POA: Diagnosis not present

## 2019-11-03 NOTE — Patient Instructions (Signed)
COVID-19 Vaccine Information can be found at: [email or web address] For questions related to vaccine distribution or appointments, please email [email or web address] or call [phone]    

## 2019-11-03 NOTE — Progress Notes (Signed)
SURVIVORSHIP VIRTUAL VISIT:  I connected with Tiffany Montes on 11/03/19 at  4:00 PM EST by my chart video and verified that I am speaking with the correct person using two identifiers.  I discussed the limitations, risks, security and privacy concerns of performing an evaluation and management service by telephone and the availability of in person appointments. I also discussed with the patient that there may be a patient responsible charge related to this service. The patient expressed understanding and agreed to proceed.   BRIEF ONCOLOGIC HISTORY:  Oncology History  Malignant neoplasm of upper-inner quadrant of left breast in female, estrogen receptor positive ([place])  07/31/2018 Initial Diagnosis   status post left breast lower inner quadrant biopsy 07/31/2018 for a clinical T1b N0, stage IA invasive ductal carcinoma, grade 3, triple positive, with an MIB-1 of 75%.   09/07/2018 Surgery   status post left lumpectomy and sentinel lymph node sampling 09/07/2018 for a pT1c pN0, stage IA invasive ductal carcinoma, grade 3, with negative margins.             (a) a total of 5 sentinel lymph nodes were removed   10/02/2018 - 09/2019 Adjuvant Chemotherapy   adjuvant chemotherapy consisting of paclitaxel weekly x12 with trastuzumab every 21 days starting 10/02/2018, completed 12/25/2018   (3) trastuzumab to be continued to complete 1 year (through December 2020).             (a) echocardiogram 08/17/2018 shows an ejection fraction of 60-65%             (b) echocardiogram on 11/17/2018 shows an ejection fraction of 60-65%             (c) echo 02/08/2019 shows an ejection fraction in the 60-65%             (d) Echo in 05/2019 shows EF of 60-65%             (e) Echo on 09/16/2019 shows EF of 60-65%     02/04/2019 - 03/04/2019 Radiation Therapy   adjuvant radiation 02/04/2019 - 03/04/2019             (a) Left breast treated to 42.56 Gy in 16 fractions             (b) Seroma boost of 8 Gy in 4 fractions,  for a total of 50.56 Gy   04/2019 - 04/2024 Anti-estrogen oral therapy   Anastrozole daily     INTERVAL HISTORY:  Tiffany Montes to review her survivorship care plan detailing her treatment course for breast cancer, as well as monitoring long-term side effects of that treatment, education regarding health maintenance, screening, and overall wellness and health promotion.     Overall, Tiffany Montes reports feeling quite well.  She notes she fell a few weeks ago in the kitchen.  She says she has some residual soreness.  She continues on Anastrozole daily and is tolerating it well.    REVIEW OF SYSTEMS:  Review of Systems  Constitutional: Negative for appetite change, chills, fatigue, fever and unexpected weight change.  HENT:   Negative for hearing loss, lump/mass, mouth sores and trouble swallowing.   Eyes: Negative for eye problems and icterus.  Respiratory: Negative for chest tightness, cough and shortness of breath.   Cardiovascular: Negative for chest pain, leg swelling and palpitations.  Gastrointestinal: Negative for abdominal distention, abdominal pain, constipation, diarrhea, nausea and vomiting.  Endocrine: Negative for hot flashes.  Genitourinary: Negative for difficulty urinating.   Musculoskeletal: Negative for arthralgias.  Skin: Negative for itching and rash.  Neurological: Negative for dizziness, extremity weakness, headaches and numbness.  Hematological: Negative for adenopathy. Does not bruise/bleed easily.  Psychiatric/Behavioral: Negative for depression. The patient is not nervous/anxious.   Breast: Denies any new nodularity, masses, tenderness, nipple changes, or nipple discharge.    ONCOLOGY TREATMENT TEAM:  1. Surgeon:  Dr. Dalbert Batman at [facility] 2. Medical Oncologist: Dr. Jana Hakim  3. Radiation Oncologist: Dr. Lisbeth Renshaw    PAST MEDICAL/SURGICAL HISTORY:  Past Medical History:  Diagnosis Date  . Anxiety   . Attention deficit disorder   . Breast cancer  ([place]) 2019   Left Breast Cancer  . Depression   . Gallstones 09/05/2011  . Hyperlipidemia   . Hypertension   . Hypothyroidism   . Migraines   . Personal history of chemotherapy 2019   Left Breast Cancer  . Personal history of radiation therapy 2019   Left Breast Cancer  . PONV (postoperative nausea and vomiting)    diffficulty waking up  . Thyroid disease    hypothyroid   Past Surgical History:  Procedure Laterality Date  . ABDOMINAL HYSTERECTOMY  2003  . BREAST BIOPSY Left 2014   benign  . BREAST LUMPECTOMY Left 09/07/2018  . BREAST LUMPECTOMY WITH RADIOACTIVE SEED AND SENTINEL LYMPH NODE BIOPSY Left 09/07/2018   Procedure: LEFT BREAST LUMPECTOMY WITH RADIOACTIVE SEED AND AXILLARY SENTINEL LYMPH NODE BIOPSY,INJECT BLUE DYE LEFT BREAST;  Surgeon: Fanny Skates, MD;  Location: [place];  Service: General;  Laterality: Left;  . CHOLECYSTECTOMY  09/26/2011   Procedure: LAPAROSCOPIC CHOLECYSTECTOMY WITH INTRAOPERATIVE CHOLANGIOGRAM;  Surgeon: Haywood Lasso, MD;  Location: [place];  Service: General;  Laterality: N/A;  . COLONOSCOPY    . CYSTIC HYGROMA EXCISION    . DENTAL SURGERY     skin graft from top of mouth to lower gums  . PORTACATH PLACEMENT N/A 09/07/2018   Procedure: INSERTION PORT-A-CATH WITH [place];  Surgeon: Fanny Skates, MD;  Location: [place];  Service: General;  Laterality: N/A;     ALLERGIES:  Allergies  Allergen Reactions  . Morphine Sulfate Nausea And Vomiting and Rash    GI upset     CURRENT MEDICATIONS:  Outpatient Encounter Medications as of 11/03/2019  Medication Sig  . amphetamine-dextroamphetamine (ADDERALL XR) 30 MG 24 hr capsule Take 1 capsule (30 mg total) by mouth every morning.  Marland Kitchen anastrozole (ARIMIDEX) 1 MG tablet Take 1 tablet (1 mg total) by mouth daily.  Marland Kitchen augmented betamethasone dipropionate (DIPROLENE-AF) 0.05 % cream Apply 1 application topically 2 (two) times daily as needed (for eczema).  Marland Kitchen BIOTIN PO Take 1 tablet by mouth daily.  Marland Kitchen  buPROPion (WELLBUTRIN XL) 300 MG 24 hr tablet TAKE 1 TABLET BY MOUTH EVERY DAY  . Cholecalciferol (VITAMIN D3 PO) Take 1 tablet by mouth daily.  . clonazePAM (KLONOPIN) 0.5 MG tablet TAKE 1 TABLET BY MOUTH EVERY DAY AT BEDTIME AS NEEDED  . fluticasone (FLONASE) 50 MCG/ACT nasal spray USE 2 SPRAYS IN EACH NOSTRIL DAILY AS NEEDED FOR ALLERGIES  . levothyroxine (SYNTHROID) 125 MCG tablet TAKE 1 TABLET BY MOUTH EVERY DAY  . losartan-hydrochlorothiazide (HYZAAR) 100-12.5 MG tablet Take 1 tablet by mouth daily.  . Multiple Vitamins-Minerals (MULTIVITAMINS THER. W/MINERALS) TABS Take 1 tablet by mouth daily.    . rosuvastatin (CRESTOR) 20 MG tablet TAKE 1 TABLET BY MOUTH EVERY DAY  . sertraline (ZOLOFT) 100 MG tablet Take 1 tablet (100 mg total) by mouth daily.  . SUMAtriptan (IMITREX) 100 MG tablet TAKE  1 TABLET BY MOUTH EVERY DAY AS NEEDED FOR MIGRAINE.MAY REPEAT IN 24 HOURS  . [DISCONTINUED] amphetamine-dextroamphetamine (ADDERALL XR) 30 MG 24 hr capsule Take 1 capsule by mouth every morning.  May refill in 1 month  . [DISCONTINUED] amphetamine-dextroamphetamine (ADDERALL XR) 30 MG 24 hr capsule Take 1 capsule by mouth every morning.  May refill in 2 months.  . [DISCONTINUED] buPROPion (WELLBUTRIN XL) 300 MG 24 hr tablet TAKE 1 TABLET BY MOUTH EVERY DAY  . [DISCONTINUED] gabapentin (NEURONTIN) 300 MG capsule Take 1 capsule (300 mg total) by mouth at bedtime.  . [DISCONTINUED] prochlorperazine (COMPAZINE) 10 MG tablet Take 1 tablet (10 mg total) by mouth every 6 (six) hours as needed (Nausea or vomiting).   No facility-administered encounter medications on file as of 11/03/2019.     ONCOLOGIC FAMILY HISTORY:  Family History  Problem Relation Age of Onset  . Diabetes Father   . Heart disease Father   . Lung cancer Father   . Breast cancer Neg Hx   . Ovarian cancer Neg Hx      GENETIC COUNSELING/TESTING: Not at this time  SOCIAL HISTORY:  Social History   Socioeconomic History  .  Marital status: Divorced    Spouse name: Not on file  . Number of children: Not on file  . Years of education: Not on file  . Highest education level: Not on file  Occupational History  . Not on file  Tobacco Use  . Smoking status: Never Smoker  . Smokeless tobacco: Never Used  Substance and Sexual Activity  . Alcohol use: Yes    Comment: 1-2 a week and reports not every week  . Drug use: No  . Sexual activity: Not Currently    Birth control/protection: Surgical  Other Topics Concern  . Not on file  Social History Narrative  . Not on file   Social Determinants of Health   Financial Resource Strain:   . Difficulty of Paying Living Expenses: Not on file  Food Insecurity:   . Worried About Charity fundraiser in the Last Year: Not on file  . Ran Out of Food in the Last Year: Not on file  Transportation Needs:   . Lack of Transportation (Medical): Not on file  . Lack of Transportation (Non-Medical): Not on file  Physical Activity:   . Days of Exercise per Week: Not on file  . Minutes of Exercise per Session: Not on file  Stress:   . Feeling of Stress : Not on file  Social Connections:   . Frequency of Communication with Friends and Family: Not on file  . Frequency of Social Gatherings with Friends and Family: Not on file  . Attends Religious Services: Not on file  . Active Member of Clubs or Organizations: Not on file  . Attends Archivist Meetings: Not on file  . Marital Status: Not on file  Intimate Partner Violence:   . Fear of Current or Ex-Partner: Not on file  . Emotionally Abused: Not on file  . Physically Abused: Not on file  . Sexually Abused: Not on file     OBSERVATIONS/OBJECTIVE:  Patient appears well and is in no apparent distress.  Mood and behavior are normal.  Breathing is non labored.  Skin visualized without rash or lesion.  LABORATORY DATA:  None for this visit.  DIAGNOSTIC IMAGING:  CLINICAL DATA:  History of left breast cancer  status post lumpectomy in December of 2019.  EXAM: DIGITAL DIAGNOSTIC BILATERAL MAMMOGRAM WITH  CAD AND TOMO  COMPARISON:  Previous exam(s).  ACR Breast Density Category b: There are scattered areas of fibroglandular density.  FINDINGS: Lumpectomy changes are seen in the left breast. No suspicious mass or malignant type microcalcifications identified in either breast.  Mammographic images were processed with CAD.  IMPRESSION: No evidence of malignancy in either breast.  RECOMMENDATION: Bilateral diagnostic mammogram in 1 year is recommended.  I have discussed the findings and recommendations with the patient. If applicable, a reminder letter will be sent to the patient regarding the next appointment.  BI-RADS CATEGORY  2: Benign.   Electronically Signed   By: Lillia Mountain M.D.   On: 07/26/2019 10:21     ASSESSMENT AND PLAN:  Ms.. Dyches is a pleasant 64 y.o. female with Stage IA left breast invasive ductal carcinoma, ER+/PR+/HER2+, diagnosed in 07/2018, treated with lumpectomy, adjuvant chemotherapy, maintenance trastuzumab, adjuvant radiation therapy, and anti-estrogen therapy with Anastrozole beginning in 04/2019.  She presents to the Survivorship Clinic for our initial meeting and routine follow-up post-completion of treatment for breast cancer.    1. Stage IA left breast cancer:  Ms. Medinger is continuing to recover from definitive treatment for breast cancer. She will follow-up with her medical oncologist, Dr. Jana Hakim in 03/2020 with history and physical exam per surveillance protocol.  She will continue her anti-estrogen therapy with Anastrozole. Thus far, she is tolerating the Anastrozole well, with minimal side effects.  Her mammogram is due 07/2020; orders placed today.  Her breast density is category B. Today, a comprehensive survivorship care plan and treatment summary was reviewed with the patient today detailing her breast cancer diagnosis, treatment  course, potential late/long-term effects of treatment, appropriate follow-up care with recommendations for the future, and patient education resources.  A copy of this summary, along with a letter will be sent to the patient's primary care provider via mail/fax/In Basket message after today's visit.    2. Bone health:  Given Ms. Canion's age/history of breast cancer and her current treatment regimen including anti-estrogen therapy with Anastrozole, she is at risk for bone demineralization.  She is scheduled to undergo bone density testing in .  She was given education on specific activities to promote bone health.  3. Cancer screening:  Due to Ms. Holzman's history and her age, she should receive screening for skin cancers, colon cancer, and gynecologic cancers.  The information and recommendations are listed on the patient's comprehensive care plan/treatment summary and were reviewed in detail with the patient.    4. Health maintenance and wellness promotion: Ms. Cifuentes was encouraged to consume 5-7 servings of fruits and vegetables per day. We reviewed the "Nutrition Rainbow" handout, as well as the handout "Take Control of Your Health and Reduce Your Cancer Risk" from the [employer].  She was also encouraged to engage in moderate to vigorous exercise for 30 minutes per day most days of the week. We discussed the [employer] fitness program, which is designed for cancer survivors to help them become more physically fit after cancer treatments.  She was instructed to limit her alcohol consumption and continue to abstain from tobacco use.     5. Support services/counseling: It is not uncommon for this period of the patient's cancer care trajectory to be one of many emotions and stressors.  We discussed how this can be increasingly difficult during the times of quarantine and social distancing due to the COVID-19 pandemic.   She was given information regarding our available services and  encouraged  to contact me with any questions or for help enrolling in any of our support group/programs.    Follow up instructions:    -Return to cancer center 03/2020 for f/u with Dr. Jana Hakim  -Mammogram due in 07/2020 -Follow up with me in 09/2020 -She is welcome to return back to the Survivorship Clinic at any time; no additional follow-up needed at this time.  -Consider referral back to survivorship as a long-term survivor for continued surveillance  The patient was provided an opportunity to ask questions and all were answered. The patient agreed with the plan and demonstrated an understanding of the instructions.   The patient was advised to call back or seek an in-person evaluation if the symptoms worsen or if the condition fails to improve as anticipated.   Total encounter time: 30 minutes*  Wilber Bihari, NP 12/08/19 9:24 AM Medical Oncology and Hematology [facility] [place], [place] [address] Tel. [phone]    Fax. [phone]   *Total Encounter Time as defined by the Centers for Medicare and Medicaid Services includes, in addition to the face-to-face time of a patient visit (documented in the note above) non-face-to-face time: obtaining and reviewing outside history, ordering and reviewing medications, tests or procedures, care coordination (communications with other health care professionals or caregivers) and documentation in the medical record.

## 2019-11-04 ENCOUNTER — Encounter (HOSPITAL_BASED_OUTPATIENT_CLINIC_OR_DEPARTMENT_OTHER)
Admission: RE | Disposition: A | Discharge status: Home / Self Care | Payer: Self-pay | Source: Home / Self Care | Attending: Surgery

## 2019-11-04 ENCOUNTER — Encounter (HOSPITAL_BASED_OUTPATIENT_CLINIC_OR_DEPARTMENT_OTHER): Payer: Self-pay | Admitting: Surgery

## 2019-11-04 ENCOUNTER — Ambulatory Visit (HOSPITAL_BASED_OUTPATIENT_CLINIC_OR_DEPARTMENT_OTHER): Payer: BC Managed Care – PPO | Admitting: Certified Registered"

## 2019-11-04 ENCOUNTER — Other Ambulatory Visit: Payer: Self-pay

## 2019-11-04 ENCOUNTER — Ambulatory Visit (HOSPITAL_BASED_OUTPATIENT_CLINIC_OR_DEPARTMENT_OTHER)
Admission: RE | Admit: 2019-11-04 | Discharge: 2019-11-04 | Disposition: A | Discharge status: Home / Self Care | Payer: BC Managed Care – PPO | Attending: Surgery | Admitting: Surgery

## 2019-11-04 ENCOUNTER — Other Ambulatory Visit: Payer: BC Managed Care – PPO

## 2019-11-04 DIAGNOSIS — I10 Essential (primary) hypertension: Secondary | ICD-10-CM | POA: Diagnosis not present

## 2019-11-04 DIAGNOSIS — E785 Hyperlipidemia, unspecified: Secondary | ICD-10-CM | POA: Diagnosis not present

## 2019-11-04 DIAGNOSIS — Z452 Encounter for adjustment and management of vascular access device: Secondary | ICD-10-CM | POA: Diagnosis not present

## 2019-11-04 DIAGNOSIS — Z79890 Hormone replacement therapy (postmenopausal): Secondary | ICD-10-CM | POA: Diagnosis not present

## 2019-11-04 DIAGNOSIS — Z853 Personal history of malignant neoplasm of breast: Secondary | ICD-10-CM | POA: Insufficient documentation

## 2019-11-04 DIAGNOSIS — Z79899 Other long term (current) drug therapy: Secondary | ICD-10-CM | POA: Diagnosis not present

## 2019-11-04 DIAGNOSIS — Z170 Estrogen receptor positive status [ER+]: Secondary | ICD-10-CM | POA: Diagnosis not present

## 2019-11-04 DIAGNOSIS — F418 Other specified anxiety disorders: Secondary | ICD-10-CM | POA: Diagnosis not present

## 2019-11-04 DIAGNOSIS — Z9221 Personal history of antineoplastic chemotherapy: Secondary | ICD-10-CM | POA: Diagnosis not present

## 2019-11-04 DIAGNOSIS — E039 Hypothyroidism, unspecified: Secondary | ICD-10-CM | POA: Insufficient documentation

## 2019-11-04 DIAGNOSIS — C50212 Malignant neoplasm of upper-inner quadrant of left female breast: Secondary | ICD-10-CM | POA: Diagnosis not present

## 2019-11-04 HISTORY — PX: PORT-A-CATH REMOVAL: SHX5289

## 2019-11-04 SURGERY — REMOVAL PORT-A-CATH
Anesthesia: Monitor Anesthesia Care | Site: Chest | Laterality: Right

## 2019-11-04 MED ORDER — BUPIVACAINE HCL (PF) 0.25 % IJ SOLN
INTRAMUSCULAR | Status: AC
  Filled 2019-11-04: qty 30

## 2019-11-04 MED ORDER — ONDANSETRON HCL 4 MG/2ML IJ SOLN: 4.0000 mg | Freq: Once | INTRAMUSCULAR | Status: DC | PRN

## 2019-11-04 MED ORDER — FENTANYL CITRATE (PF) 100 MCG/2ML IJ SOLN: 50.0000 ug | INTRAMUSCULAR | Status: DC | PRN

## 2019-11-04 MED ORDER — FENTANYL CITRATE (PF) 100 MCG/2ML IJ SOLN
INTRAMUSCULAR | Status: AC
  Filled 2019-11-04: qty 2

## 2019-11-04 MED ORDER — MIDAZOLAM HCL 2 MG/2ML IJ SOLN: 1.0000 mg | INTRAMUSCULAR | Status: DC | PRN

## 2019-11-04 MED ORDER — CHLORHEXIDINE GLUCONATE CLOTH 2 % EX PADS: 6.0000 | MEDICATED_PAD | Freq: Once | CUTANEOUS | Status: DC

## 2019-11-04 MED ORDER — LIDOCAINE HCL (PF) 1 % IJ SOLN
INTRAMUSCULAR | Status: AC
  Filled 2019-11-04: qty 30

## 2019-11-04 MED ORDER — OXYCODONE HCL 5 MG PO TABS: 5.0000 mg | ORAL_TABLET | Freq: Once | ORAL | Status: DC | PRN

## 2019-11-04 MED ORDER — CEFAZOLIN SODIUM-DEXTROSE 2-4 GM/100ML-% IV SOLN
INTRAVENOUS | Status: AC
  Filled 2019-11-04: qty 100

## 2019-11-04 MED ORDER — MIDAZOLAM HCL 5 MG/5ML IJ SOLN
INTRAMUSCULAR | Status: DC | PRN
  Administered 2019-11-04: 1 mg via INTRAVENOUS

## 2019-11-04 MED ORDER — MIDAZOLAM HCL 2 MG/2ML IJ SOLN
INTRAMUSCULAR | Status: AC
  Filled 2019-11-04: qty 2

## 2019-11-04 MED ORDER — FENTANYL CITRATE (PF) 100 MCG/2ML IJ SOLN
INTRAMUSCULAR | Status: DC | PRN
  Administered 2019-11-04: 50 ug via INTRAVENOUS

## 2019-11-04 MED ORDER — OXYCODONE HCL 5 MG/5ML PO SOLN: 5.0000 mg | Freq: Once | ORAL | Status: DC | PRN

## 2019-11-04 MED ORDER — FENTANYL CITRATE (PF) 100 MCG/2ML IJ SOLN: 25.0000 ug | INTRAMUSCULAR | Status: DC | PRN

## 2019-11-04 MED ORDER — LIDOCAINE-EPINEPHRINE 1 %-1:100000 IJ SOLN
INTRAMUSCULAR | Status: AC
  Filled 2019-11-04: qty 1

## 2019-11-04 MED ORDER — PROPOFOL 500 MG/50ML IV EMUL
INTRAVENOUS | Status: DC | PRN
  Administered 2019-11-04: 80 ug/kg/min via INTRAVENOUS
  Administered 2019-11-04 (×2): 20 mg via INTRAVENOUS
  Administered 2019-11-04: 10 mg via INTRAVENOUS

## 2019-11-04 MED ORDER — ONDANSETRON HCL 4 MG/2ML IJ SOLN
INTRAMUSCULAR | Status: DC | PRN
  Administered 2019-11-04: 4 mg via INTRAVENOUS

## 2019-11-04 MED ORDER — LACTATED RINGERS IV SOLN: INTRAVENOUS | Status: DC

## 2019-11-04 MED ORDER — BUPIVACAINE HCL (PF) 0.25 % IJ SOLN
INTRAMUSCULAR | Status: DC | PRN
  Administered 2019-11-04: 19 mL

## 2019-11-04 MED ORDER — LIDOCAINE 2% (20 MG/ML) 5 ML SYRINGE
INTRAMUSCULAR | Status: DC | PRN
  Administered 2019-11-04: 50 mg via INTRAVENOUS

## 2019-11-04 MED ORDER — CEFAZOLIN SODIUM-DEXTROSE 2-4 GM/100ML-% IV SOLN
2.0000 g | INTRAVENOUS | Status: AC
  Administered 2019-11-04: 2 g via INTRAVENOUS

## 2019-11-04 SURGICAL SUPPLY — 48 items
APL PRP STRL LF DISP 70% ISPRP (MISCELLANEOUS) ×1
APL SKNCLS STERI-STRIP NONHPOA (GAUZE/BANDAGES/DRESSINGS) ×1
APL SWBSTK 6 STRL LF DISP (MISCELLANEOUS)
APPLICATOR COTTON TIP 6 STRL (MISCELLANEOUS) IMPLANT
APPLICATOR COTTON TIP 6IN STRL (MISCELLANEOUS)
BENZOIN TINCTURE PRP APPL 2/3 (GAUZE/BANDAGES/DRESSINGS) ×2 IMPLANT
BLADE HEX COATED 2.75 (ELECTRODE) ×2 IMPLANT
BLADE SURG 15 STRL LF DISP TIS (BLADE) ×1 IMPLANT
BLADE SURG 15 STRL SS (BLADE) ×2
CANISTER SUCT 1200ML W/VALVE (MISCELLANEOUS) IMPLANT
CHLORAPREP W/TINT 26 (MISCELLANEOUS) ×2 IMPLANT
COVER BACK TABLE 60X90IN (DRAPES) ×2 IMPLANT
COVER MAYO STAND STRL (DRAPES) ×2 IMPLANT
COVER WAND RF STERILE (DRAPES) IMPLANT
DECANTER SPIKE VIAL GLASS SM (MISCELLANEOUS) ×2 IMPLANT
DRAPE LAPAROTOMY 100X72 PEDS (DRAPES) ×2 IMPLANT
DRAPE UTILITY XL STRL (DRAPES) ×2 IMPLANT
DRSG TEGADERM 2-3/8X2-3/4 SM (GAUZE/BANDAGES/DRESSINGS) ×1 IMPLANT
DRSG TEGADERM 4X4.75 (GAUZE/BANDAGES/DRESSINGS) ×1 IMPLANT
ELECT REM PT RETURN 9FT ADLT (ELECTROSURGICAL) ×2
ELECTRODE REM PT RTRN 9FT ADLT (ELECTROSURGICAL) ×1 IMPLANT
GAUZE SPONGE 4X4 12PLY STRL LF (GAUZE/BANDAGES/DRESSINGS) IMPLANT
GLOVE BIO SURGEON STRL SZ7 (GLOVE) ×2 IMPLANT
GLOVE BIOGEL PI IND STRL 6.5 (GLOVE) IMPLANT
GLOVE BIOGEL PI IND STRL 7.0 (GLOVE) IMPLANT
GLOVE BIOGEL PI IND STRL 7.5 (GLOVE) ×1 IMPLANT
GLOVE BIOGEL PI INDICATOR 6.5 (GLOVE) ×1
GLOVE BIOGEL PI INDICATOR 7.0 (GLOVE) ×1
GLOVE BIOGEL PI INDICATOR 7.5 (GLOVE) ×1
GLOVE ECLIPSE 6.5 STRL STRAW (GLOVE) ×1 IMPLANT
GOWN STRL REUS W/ TWL LRG LVL3 (GOWN DISPOSABLE) ×1 IMPLANT
GOWN STRL REUS W/TWL LRG LVL3 (GOWN DISPOSABLE) ×4
NDL HYPO 25X1 1.5 SAFETY (NEEDLE) ×1 IMPLANT
NEEDLE HYPO 25X1 1.5 SAFETY (NEEDLE) ×2 IMPLANT
NS IRRIG 1000ML POUR BTL (IV SOLUTION) ×1 IMPLANT
PACK BASIN DAY SURGERY FS (CUSTOM PROCEDURE TRAY) ×2 IMPLANT
PENCIL SMOKE EVACUATOR (MISCELLANEOUS) ×2 IMPLANT
SPONGE GAUZE 2X2 8PLY STRL LF (GAUZE/BANDAGES/DRESSINGS) ×1 IMPLANT
SPONGE LAP 4X18 RFD (DISPOSABLE) ×2 IMPLANT
STRIP CLOSURE SKIN 1/2X4 (GAUZE/BANDAGES/DRESSINGS) ×2 IMPLANT
SUT MON AB 4-0 PC3 18 (SUTURE) ×2 IMPLANT
SUT VIC AB 3-0 SH 27 (SUTURE) ×2
SUT VIC AB 3-0 SH 27X BRD (SUTURE) ×1 IMPLANT
SYR BULB 3OZ (MISCELLANEOUS) ×1 IMPLANT
SYR CONTROL 10ML LL (SYRINGE) ×2 IMPLANT
TOWEL GREEN STERILE FF (TOWEL DISPOSABLE) ×3 IMPLANT
TUBE CONNECTING 20X1/4 (TUBING) IMPLANT
YANKAUER SUCT BULB TIP NO VENT (SUCTIONS) IMPLANT

## 2019-11-04 NOTE — Anesthesia Postprocedure Evaluation (Signed)
Anesthesia Post Note  Patient: Tiffany Montes  Procedure(s) Performed: REMOVAL PORT-A-CATH (Right Chest)     Patient location during evaluation: PACU Anesthesia Type: MAC Level of consciousness: awake and alert Pain management: pain level controlled Vital Signs Assessment: post-procedure vital signs reviewed and stable Respiratory status: spontaneous breathing, nonlabored ventilation and respiratory function stable Cardiovascular status: blood pressure returned to baseline and stable Postop Assessment: no apparent nausea or vomiting Anesthetic complications: no    Last Vitals:  Vitals:   11/04/19 1537 11/04/19 1545  BP: (!) 179/90 (!) 157/76  Pulse: 63 63  Resp: 11 15  Temp: (!) 36.4 C   SpO2: 100% 99%    Last Pain:  Vitals:   11/04/19 1537  TempSrc:   PainSc: 0-No pain                 Lidia Collum

## 2019-11-04 NOTE — Interval H&P Note (Signed)
History and Physical Interval Note:  11/04/2019 2:11 PM  Tiffany Montes  has presented today for surgery, with the diagnosis of LEFT BREAST CANCER.  The various methods of treatment have been discussed with the patient and family. After consideration of risks, benefits and other options for treatment, the patient has consented to  Procedure(s): REMOVAL PORT-A-CATH (N/A) as a surgical intervention.  The patient's history has been reviewed, patient examined, no change in status, stable for surgery.  I have reviewed the patient's chart and labs.  Questions were answered to the patient's satisfaction.     Maia Petties

## 2019-11-04 NOTE — Transfer of Care (Signed)
Immediate Anesthesia Transfer of Care Note  Patient: Tiffany Montes  Procedure(s) Performed: REMOVAL PORT-A-CATH (Right Chest)  Patient Location: PACU  Anesthesia Type:MAC  Level of Consciousness: awake, alert , oriented and patient cooperative  Airway & Oxygen Therapy: Patient Spontanous Breathing  Post-op Assessment: Report given to RN and Post -op Vital signs reviewed and stable  Post vital signs: Reviewed and stable  Last Vitals:  Vitals Value Taken Time  BP 179/90 11/04/19 1537  Temp    Pulse 62 11/04/19 1540  Resp 12 11/04/19 1540  SpO2 99 % 11/04/19 1540  Vitals shown include unvalidated device data.  Last Pain:  Vitals:   11/04/19 1347  TempSrc: Temporal  PainSc: 0-No pain      Patients Stated Pain Goal: 0 (59/09/31 1216)  Complications: No apparent anesthesia complications

## 2019-11-04 NOTE — Discharge Instructions (Signed)
[place] Office Phone Number [phone]   POST OP INSTRUCTIONS  Always review your discharge instruction sheet given to you by the facility where your surgery was performed.  IF YOU HAVE DISABILITY OR FAMILY LEAVE FORMS, YOU MUST BRING THEM TO THE OFFICE FOR PROCESSING.  DO NOT GIVE THEM TO YOUR DOCTOR.  1. You may take acetaminophen (Tylenol) or ibuprofen (Advil) as needed. 2. Take your usually prescribed medications unless otherwise directed 3. You should eat very light the first 24 hours after surgery, such as soup, crackers, pudding, etc.  Resume your normal diet the day after surgery. 4. Most patients will experience some swelling and bruising around the surgical site.  Ice packs will help.  Swelling and bruising can take several days to resolve.  5. You may remove your bandages 48 hours after surgery, and you may shower at that time.  You will have steri-strips (small skin tapes) in place directly over the incision.  These strips should be left on the skin for 7-10 days.   6. ACTIVITIES:  You may resume regular daily activities (gradually increasing) beginning the next day.  a. You may drive when you can comfortably wear a seatbelt, and you can safely maneuver your car and apply brakes. b. RETURN TO WORK:  1-2 weeks 7. You should see your doctor in the office for a follow-up appointment approximately two to three weeks after your surgery.    WHEN TO CALL YOUR DOCTOR: 1. Fever over 101.0 2. Nausea and/or vomiting. 3. Extreme swelling or bruising. 4. Continued bleeding from incision. 5. Increased pain, redness, or drainage from the incision.  The clinic staff is available to answer your questions during regular business hours.  Please don't hesitate to call and ask to speak to one of the nurses for clinical concerns.  If you have a medical emergency, go to the nearest emergency room or call 911.  A surgeon from [facility] is always on call at the  hospital.  For further questions, please visit [email or web address]     Post Anesthesia Home Care Instructions  Activity: Get plenty of rest for the remainder of the day. A responsible individual must stay with you for 24 hours following the procedure.  For the next 24 hours, DO NOT: -Drive a car -Paediatric nurse -Drink alcoholic beverages -Take any medication unless instructed by your physician -Make any legal decisions or sign important papers.  Meals: Start with liquid foods such as gelatin or soup. Progress to regular foods as tolerated. Avoid greasy, spicy, heavy foods. If nausea and/or vomiting occur, drink only clear liquids until the nausea and/or vomiting subsides. Call your physician if vomiting continues.  Special Instructions/Symptoms: Your throat may feel dry or sore from the anesthesia or the breathing tube placed in your throat during surgery. If this causes discomfort, gargle with warm salt water. The discomfort should disappear within 24 hours.  If you had a scopolamine patch placed behind your ear for the management of post- operative nausea and/or vomiting:  1. The medication in the patch is effective for 72 hours, after which it should be removed.  Wrap patch in a tissue and discard in the trash. Wash hands thoroughly with soap and water. 2. You may remove the patch earlier than 72 hours if you experience unpleasant side effects which may include dry mouth, dizziness or visual disturbances. 3. Avoid touching the patch. Wash your hands with soap and water after contact with the patch.

## 2019-11-04 NOTE — Anesthesia Preprocedure Evaluation (Addendum)
Anesthesia Evaluation  Patient identified by MRN, date of birth, ID band Patient awake    Reviewed: Allergy & Precautions, NPO status , Patient's Chart, lab work & pertinent test results  History of Anesthesia Complications (+) PONVNegative for: history of anesthetic complications  Airway Mallampati: II  TM Distance: >3 FB Neck ROM: Full    Dental  (+) Teeth Intact   Pulmonary neg pulmonary ROS,    Pulmonary exam normal        Cardiovascular hypertension, Normal cardiovascular exam     Neuro/Psych  Headaches, PSYCHIATRIC DISORDERS Anxiety Depression    GI/Hepatic negative GI ROS, Neg liver ROS,   Endo/Other  Hypothyroidism   Renal/GU negative Renal ROS  negative genitourinary   Musculoskeletal negative musculoskeletal ROS (+)   Abdominal   Peds  (+) ATTENTION DEFICIT DISORDER WITHOUT HYPERACTIVITY Hematology negative hematology ROS (+)   Anesthesia Other Findings Breast cancer s/p chemo/XRT  Reproductive/Obstetrics                            Anesthesia Physical Anesthesia Plan  ASA: II  Anesthesia Plan: MAC   Post-op Pain Management:    Induction: Intravenous  PONV Risk Score and Plan: Propofol infusion, TIVA and Treatment may vary due to age or medical condition  Airway Management Planned: Natural Airway, Nasal Cannula and Simple Face Mask  Additional Equipment: None  Intra-op Plan:   Post-operative Plan:   Informed Consent: I have reviewed the patients History and Physical, chart, labs and discussed the procedure including the risks, benefits and alternatives for the proposed anesthesia with the patient or authorized representative who has indicated his/her understanding and acceptance.       Plan Discussed with:   Anesthesia Plan Comments:        Anesthesia Quick Evaluation

## 2019-11-04 NOTE — Op Note (Signed)
Preop diagnosis: History of left invasive ductal carcinoma Postop diagnosis: Same Procedure performed: Port removal Surgeon:Brennin Durfee K Jakaree Pickard Anesthesia: Local MAC Indications: This is a 64 year old female who is status post chemotherapy for breast cancer.  She has completed her course of treatment and is ready to have her port removed.  Description of procedure The patient is brought to the operating room placed in the supine position on the operating room table.  After adequate level of intravenous sedation was given, her right chest was prepped with ChloraPrep and draped in sterile fashion.  A timeout was taken to ensure the proper patient and proper procedure.  I infiltrated the area over the mass with 0.25% Marcaine.  A transverse incision was made.  We dissected down to the port.  I identified the 3 Prolene sutures that were holding the port in place.  We excised these along with some of the surrounding fibrin sheath.  The port was removed entirely.  We held pressure and hemostasis seem to be good.  We inspected carefully and no further bleeding was noted.  The wound was closed with a deep layer of 3-0 Vicryl and a subcuticular layer 4-0 Monocryl.  Benzoin Steri-Strips were applied.  A clean dressing was applied.  The patient was then awakened and brought to the recovery room in stable condition.  All sponge, instrument, and needle counts are correct.  Imogene Burn. Georgette Dover, MD, [facility] Surgery  General/ Trauma Surgery   11/04/2019 3:44 PM

## 2019-11-05 ENCOUNTER — Ambulatory Visit
Admission: RE | Admit: 2019-11-05 | Discharge: 2019-11-05 | Disposition: A | Discharge status: Home / Self Care | Payer: BC Managed Care – PPO | Source: Ambulatory Visit | Attending: Oncology | Admitting: Oncology

## 2019-11-05 ENCOUNTER — Encounter: Payer: Self-pay | Admitting: *Deleted

## 2019-11-05 DIAGNOSIS — Z13820 Encounter for screening for osteoporosis: Secondary | ICD-10-CM | POA: Diagnosis not present

## 2019-11-05 DIAGNOSIS — C50212 Malignant neoplasm of upper-inner quadrant of left female breast: Secondary | ICD-10-CM

## 2019-11-05 DIAGNOSIS — Z170 Estrogen receptor positive status [ER+]: Secondary | ICD-10-CM

## 2019-11-05 DIAGNOSIS — Z780 Asymptomatic menopausal state: Secondary | ICD-10-CM | POA: Diagnosis not present

## 2019-11-14 ENCOUNTER — Other Ambulatory Visit: Payer: Self-pay | Admitting: Family Medicine

## 2019-11-26 ENCOUNTER — Other Ambulatory Visit: Payer: Self-pay

## 2019-11-26 ENCOUNTER — Encounter: Payer: Self-pay | Admitting: Family Medicine

## 2019-11-26 ENCOUNTER — Ambulatory Visit (INDEPENDENT_AMBULATORY_CARE_PROVIDER_SITE_OTHER): Payer: BC Managed Care – PPO

## 2019-11-26 ENCOUNTER — Ambulatory Visit (INDEPENDENT_AMBULATORY_CARE_PROVIDER_SITE_OTHER): Payer: BC Managed Care – PPO | Admitting: Family Medicine

## 2019-11-26 VITALS — BP 128/80 | HR 90 | Temp 97.5°F | Ht 62.25 in | Wt 218.1 lb

## 2019-11-26 DIAGNOSIS — M25561 Pain in right knee: Secondary | ICD-10-CM

## 2019-11-26 NOTE — Progress Notes (Signed)
Subjective:     Patient ID: Tiffany Montes, female   DOB: [birth year], 64 y.o.   MRN: [id number]  HPI   Tiffany Montes is seen with right knee pain.  This is a new problem.  She states she had an injury about 3 weeks ago.  She had some water had been spilled in her kitchen and she was walking through and slipped.  She is not sure exactly how she landed but she noticed some right knee pain immediately afterwards and she states has gotten even worse since then.  She has tried multiple things including ice, topical sports creams, and heat without improvement.  Her pain is mostly along the medial aspect of the knee and right proximal medial tibia.  She has not seen any effusion.  No locking or giving way.  Pain increased with ambulation.  Pain is moderate.  Denies any other injuries.  She is also taken Tylenol and ibuprofen without much improvement.  Past Medical History:  Diagnosis Date  . Anxiety   . Attention deficit disorder   . Breast cancer ([place]) 2019   Left Breast Cancer  . Depression   . Gallstones 09/05/2011  . Hyperlipidemia   . Hypertension   . Hypothyroidism   . Migraines   . Personal history of chemotherapy 2019   Left Breast Cancer  . Personal history of radiation therapy 2019   Left Breast Cancer  . PONV (postoperative nausea and vomiting)    diffficulty waking up  . Thyroid disease    hypothyroid   Past Surgical History:  Procedure Laterality Date  . ABDOMINAL HYSTERECTOMY  2003  . BREAST BIOPSY Left 2014   benign  . BREAST LUMPECTOMY Left 09/07/2018  . BREAST LUMPECTOMY WITH RADIOACTIVE SEED AND SENTINEL LYMPH NODE BIOPSY Left 09/07/2018   Procedure: LEFT BREAST LUMPECTOMY WITH RADIOACTIVE SEED AND AXILLARY SENTINEL LYMPH NODE BIOPSY,INJECT BLUE DYE LEFT BREAST;  Surgeon: Fanny Skates, MD;  Location: [place];  Service: General;  Laterality: Left;  . CHOLECYSTECTOMY  09/26/2011   Procedure: LAPAROSCOPIC CHOLECYSTECTOMY WITH INTRAOPERATIVE CHOLANGIOGRAM;  Surgeon:  Haywood Lasso, MD;  Location: [place];  Service: General;  Laterality: N/A;  . COLONOSCOPY    . CYSTIC HYGROMA EXCISION    . DENTAL SURGERY     skin graft from top of mouth to lower gums  . PORT-A-CATH REMOVAL Right 11/04/2019   Procedure: REMOVAL PORT-A-CATH;  Surgeon: Donnie Mesa, MD;  Location: [place];  Service: General;  Laterality: Right;  . PORTACATH PLACEMENT N/A 09/07/2018   Procedure: INSERTION PORT-A-CATH WITH [place];  Surgeon: Fanny Skates, MD;  Location: [place];  Service: General;  Laterality: N/A;    reports that she has never smoked. She has never used smokeless tobacco. She reports current alcohol use. She reports that she does not use drugs. family history includes Diabetes in her father; Heart disease in her father; Lung cancer in her father. Allergies  Allergen Reactions  . Morphine Sulfate Nausea And Vomiting and Rash    GI upset     Review of Systems  Constitutional: Negative for chills and fever.  Respiratory: Negative for shortness of breath.   Cardiovascular: Negative for chest pain.  Neurological: Negative for weakness and numbness.       Objective:   Physical Exam Vitals reviewed.  Constitutional:      Appearance: Normal appearance.  Cardiovascular:     Rate and Rhythm: Normal rate and regular rhythm.  Pulmonary:     Effort: Pulmonary effort is  normal.     Breath sounds: Normal breath sounds.  Musculoskeletal:     Right lower leg: No edema.     Left lower leg: No edema.     Comments: Right knee reveals full range of motion.  There is no ecchymosis.  No warmth.  No erythema.  No obvious effusion.  She has some tenderness along the medial joint space.  No patellar tenderness.  Neurological:     Mental Status: She is alert.        Assessment:     Right medial knee pain following fall about 3 weeks ago.  X-ray does reveal some degenerative changes mostly involving the medial compartment but no acute bony abnormality noted.   This will be over read by radiology.  Reviewed x-ray results with patient    Plan:     -Continue icing 15 to 20 minutes a few times daily -She will consider over-the-counter elastic knee sleeve for some additional support -Set up sports medicine referral for further assessment given the fact that her pain seems to be increasing 3 to 4 weeks out from injury  Eulas Post MD [facility]

## 2019-11-26 NOTE — Patient Instructions (Signed)
I will set up Sports Medicine referral.    Continue with icing 15-20 minutes 2-3 times daily.

## 2019-11-29 ENCOUNTER — Encounter: Payer: Self-pay | Admitting: Family Medicine

## 2019-11-29 ENCOUNTER — Telehealth: Payer: Self-pay | Admitting: Family Medicine

## 2019-11-29 NOTE — Telephone Encounter (Signed)
Pt is calling back to follow up on her foot referral. Per pt, she is having a hard time walking. Thanks

## 2019-11-29 NOTE — Telephone Encounter (Signed)
Left detailed message with information to the Sports Medicine Office that should be contacting patient to schedule appointment.

## 2019-11-30 ENCOUNTER — Ambulatory Visit: Payer: Self-pay

## 2019-11-30 ENCOUNTER — Encounter: Payer: Self-pay | Admitting: Family Medicine

## 2019-11-30 ENCOUNTER — Ambulatory Visit: Payer: BC Managed Care – PPO | Admitting: Family Medicine

## 2019-11-30 ENCOUNTER — Other Ambulatory Visit: Payer: Self-pay

## 2019-11-30 VITALS — BP 120/80 | HR 90 | Ht 62.25 in | Wt 218.2 lb

## 2019-11-30 DIAGNOSIS — M25561 Pain in right knee: Secondary | ICD-10-CM | POA: Diagnosis not present

## 2019-11-30 NOTE — Progress Notes (Signed)
Subjective:    I'm seeing this patient as a consultation for:  Dr. Elease Hashimoto. Note will be routed back to referring provider/PCP.  CC: R knee pain  I, Molly Weber, LAT, ATC, am serving as scribe for Dr. Lynne Leader.  HPI: Pt is a 64 y/o female presenting w/ c/o R medial knee pain x 3 weeks after slipping on a wet floor.  She rates her pain at a 7/10 and describes her pain as sharp, especially w/ any type of pivoting motion.  Radiating pain: Yes, intermittently R knee swelling: Slight R knee mechanical symptoms: No Aggravating factors: Walking; transitioning from sit-to-stand Treatments tried: Ice, heat, topical pain-relieving rub, Tylenol, IBU  Diagnostic imaging: R knee XR- 11/26/19  Past medical history, Surgical history, Family history, Social history, Allergies, and medications have been entered into the medical record, reviewed.   Review of Systems: No new headache, visual changes, nausea, vomiting, diarrhea, constipation, dizziness, abdominal pain, skin rash, fevers, chills, night sweats, weight loss, swollen lymph nodes, body aches, joint swelling, muscle aches, chest pain, shortness of breath, mood changes, visual or auditory hallucinations.   Objective:    Vitals:   11/30/19 1353  BP: 120/80  Pulse: 90  SpO2: 97%   General: Well Developed, well nourished, and in no acute distress.  Neuro/Psych: Alert and oriented x3, extra-ocular muscles intact, able to move all 4 extremities, sensation grossly intact. Skin: Warm and dry, no rashes noted.  Respiratory: Not using accessory muscles, speaking in full sentences, trachea midline.  Cardiovascular: Pulses palpable, no extremity edema. Abdomen: Does not appear distended. MSK: Right knee normal-appearing no significant bruising or contusion at this point.  No swelling. Range of motion 0-100 degrees with mild crepitation. Tender palpation anterior medial joint line.  Nontender otherwise. Some guarding with exam stability  testing.  No laxity T anterior drawer posterior drawer testing.  No laxity with valgus or varus stress however some pain with MCL stress test. Positive medial McMurray's test negative lateral. Intact strength to flexion and extension.  Lab and Radiology Results No results found for this or any previous visit (from the past 72 hour(s)). DG Knee Complete 4 Views Right  Result Date: 11/27/2019 CLINICAL DATA:  Acute pain of right knee. Right knee pain after fall 3 weeks ago. Pain medially. EXAM: RIGHT KNEE - COMPLETE 4+ VIEW COMPARISON:  None. FINDINGS: No evidence of fracture, dislocation, or joint effusion. Mild medial compartment joint space narrowing with early peripheral spurring. Tiny superior patellar spurs. Soft tissues are unremarkable. IMPRESSION: Mild osteoarthritis, most prominent in the medial compartment. No acute or healing fracture. Electronically Signed   By: Keith Rake M.D.   On: 11/27/2019 01:39  I, Lynne Leader, personally (independently) visualized and performed the interpretation of the images attached in this note.   Diagnostic Limited MSK Ultrasound of: Right knee Quad tendon intact.  Mild joint effusion Patellar tendon intact. Medial joint line: Narrowed medial meniscus with hypoechoic fissure through mid substance with partial extrusion of medial meniscus. MCL appears to be intact. Lateral joint line normal-appearing No Baker's cyst Impression: Probable medial meniscus tear.    Procedure: Real-time Ultrasound Guided Injection of right knee Device: Philips Affiniti 50G Images permanently stored and available for review in the ultrasound unit. Verbal informed consent obtained.  Discussed risks and benefits of procedure. Warned about infection bleeding damage to structures skin hypopigmentation and fat atrophy among others. Patient expresses understanding and agreement Time-out conducted.   Noted no overlying erythema, induration, or other signs of local  infection.     Skin prepped in a sterile fashion.   Local anesthesia: Topical Ethyl chloride.   With sterile technique and under real time ultrasound guidance:  40 mg of Kenalog and 4 mL of Marcaine injected easily.   Completed without difficulty   Pain immediately resolved suggesting accurate placement of the medication.   Advised to call if fevers/chills, erythema, induration, drainage, or persistent bleeding.   Images permanently stored and available for review in the ultrasound unit.  Impression: Technically successful ultrasound guided injection.        Impression and Recommendations:    Assessment and Plan: 64 y.o. female with right knee pain after fall.  Fortunately no evidence of fracture on x-ray. Patient likely has a medial meniscus tear.  Fortunately she does not have significant mechanical symptoms. Discussed options.  She is already had trial some conservative management with little benefit.  Plan for trial of steroid injection as above today.  Also recommend Voltaren gel.  Check back in a few weeks if not improving.  Next step is probably MRI for surgical planning.   Orders Placed This Encounter  Procedures  . [place] LIMITED JOINT SPACE STRUCTURES LOW RIGHT(NO LINKED CHARGES)    Order Specific Question:   Reason for Exam (SYMPTOM  OR DIAGNOSIS REQUIRED)    Answer:   r knee pain    Order Specific Question:   Preferred imaging location?    Answer:   [place]   No orders of the defined types were placed in this encounter.   Discussed warning signs or symptoms. Please see discharge instructions. Patient expresses understanding.   The above documentation has been reviewed and is accurate and complete Lynne Leader

## 2019-11-30 NOTE — Patient Instructions (Signed)
Thank you for coming in today. Call or go to the ER if you develop a large red swollen joint with extreme pain or oozing puss.  Use voltaren gel 4x daily.  Recheck in 4 weeks.  Return sooner if needed.

## 2019-12-03 ENCOUNTER — Telehealth: Payer: Self-pay

## 2019-12-03 NOTE — Telephone Encounter (Signed)
Called and left a message per Wilber Bihari, NP, asking if she received the care plan in the mail. Ask her to call the office back.

## 2019-12-05 ENCOUNTER — Ambulatory Visit: Payer: BC Managed Care – PPO | Attending: Internal Medicine

## 2019-12-05 DIAGNOSIS — Z23 Encounter for immunization: Secondary | ICD-10-CM | POA: Insufficient documentation

## 2019-12-05 NOTE — Progress Notes (Signed)
   Covid-19 Vaccination Clinic  Name:  Tiffany Montes    MRN: [id number] DOB: Oct 22, 1955  12/05/2019  [name] was observed post Covid-19 immunization for 15 minutes without incidence. She was provided with Vaccine Information Sheet and instruction to access the V-Safe system.   [name] was instructed to call 911 with any severe reactions post vaccine: Marland Kitchen Difficulty breathing  . Swelling of your face and throat  . A fast heartbeat  . A bad rash all over your body  . Dizziness and weakness    Immunizations Administered    Name Date Dose VIS Date Route   Pfizer COVID-19 Vaccine 12/05/2019 10:32 AM 0.3 mL 09/17/2019 Intramuscular   Manufacturer: [place]   Lot: [id number]   [place]: [phone]

## 2019-12-08 ENCOUNTER — Telehealth: Payer: Self-pay

## 2019-12-08 NOTE — Telephone Encounter (Signed)
LVM for pt to return call. Inquiring if pt received her visit summary from her last visit here.

## 2019-12-10 ENCOUNTER — Telehealth: Payer: Self-pay | Admitting: Family Medicine

## 2019-12-10 DIAGNOSIS — M25561 Pain in right knee: Secondary | ICD-10-CM

## 2019-12-10 MED ORDER — TRAMADOL HCL 50 MG PO TABS: 50.0000 mg | ORAL_TABLET | Freq: Three times a day (TID) | ORAL | 0 refills | Status: DC | PRN

## 2019-12-10 NOTE — Telephone Encounter (Signed)
Pt would like an MRI and would like some pain medication.  You can order her prescription to [facility] at [address].

## 2019-12-10 NOTE — Telephone Encounter (Signed)
MRI ordered.  Tramadol for pain sent to pharmacy.  Recheck after MRI.

## 2019-12-10 NOTE — Telephone Encounter (Signed)
Patient was seen by Dr Georgina Snell on Tuesday, 11/30/2019. She said that she was given a cortisone injection but it has not helped. She said that she almost feels like the pain in her right knee is worse now than before and is causing her that have other issue when walking.  Please advise.

## 2019-12-10 NOTE — Telephone Encounter (Signed)
At this point next step is probably MRI.  Either recheck in person with me or I can arrange for MRI to be completed prior to next visit.  What would you like to do?  Tiffany Montes

## 2019-12-13 ENCOUNTER — Telehealth: Payer: Self-pay

## 2019-12-13 NOTE — Telephone Encounter (Signed)
[facility] called to let us know that patient did not want to drive all the way to [facility] for her MRI would like to stay in [place].

## 2019-12-13 NOTE — Telephone Encounter (Signed)
She called and left a message. She has not received anything in the mail.  Two previous calls asking if she received care plan or visit summary.

## 2019-12-14 NOTE — Telephone Encounter (Signed)
Please reauthorize MRI for [facility] location and reschedule.

## 2019-12-14 NOTE — Telephone Encounter (Signed)
Sent to [facility] imaging

## 2019-12-18 ENCOUNTER — Other Ambulatory Visit: Payer: Self-pay | Admitting: Family Medicine

## 2019-12-22 ENCOUNTER — Other Ambulatory Visit: Payer: Self-pay | Admitting: Family Medicine

## 2019-12-22 ENCOUNTER — Other Ambulatory Visit: Payer: Self-pay | Admitting: Oncology

## 2019-12-22 ENCOUNTER — Telehealth: Payer: Self-pay | Admitting: Family Medicine

## 2019-12-22 NOTE — Telephone Encounter (Signed)
Last ov:11/26/19 Last filled:03/29/2019

## 2019-12-22 NOTE — Telephone Encounter (Signed)
Pt has MRI scheduled for 3/26 and recheck with [place] the following Monday. She is out of Tramadol and wanted to know if Dr. Georgina Snell would refill until she gets through this. She has taken sparingly and sometimes takes advil instead, but it does not always help with the pain  [facility] [address]

## 2019-12-23 MED ORDER — TRAMADOL HCL 50 MG PO TABS: 50.0000 mg | ORAL_TABLET | Freq: Three times a day (TID) | ORAL | 0 refills | Status: DC | PRN

## 2019-12-23 NOTE — Telephone Encounter (Signed)
Medication sent.

## 2019-12-23 NOTE — Telephone Encounter (Signed)
Pt notified via voice mail

## 2019-12-28 ENCOUNTER — Ambulatory Visit: Payer: BC Managed Care – PPO | Admitting: Family Medicine

## 2019-12-29 ENCOUNTER — Ambulatory Visit: Payer: BC Managed Care – PPO | Attending: Internal Medicine

## 2019-12-29 DIAGNOSIS — Z23 Encounter for immunization: Secondary | ICD-10-CM

## 2019-12-29 NOTE — Progress Notes (Signed)
   Covid-19 Vaccination Clinic  Name:  Tiffany Montes    MRN: [id number] DOB: Mar 18, 1956  12/29/2019  Tiffany Montes was observed post Covid-19 immunization for 15 minutes without incident. She was provided with Vaccine Information Sheet and instruction to access the V-Safe system.   Tiffany Montes was instructed to call 911 with any severe reactions post vaccine: Marland Kitchen Difficulty breathing  . Swelling of face and throat  . A fast heartbeat  . A bad rash all over body  . Dizziness and weakness   Immunizations Administered    Name Date Dose VIS Date Route   Pfizer COVID-19 Vaccine 12/29/2019  1:40 PM 0.3 mL 09/17/2019 Intramuscular   Manufacturer: [place]   Lot: [id number]   [place]: [id number]

## 2019-12-31 ENCOUNTER — Ambulatory Visit
Admission: RE | Admit: 2019-12-31 | Discharge: 2019-12-31 | Disposition: A | Discharge status: Home / Self Care | Payer: BC Managed Care – PPO | Source: Ambulatory Visit | Attending: Family Medicine | Admitting: Family Medicine

## 2019-12-31 ENCOUNTER — Other Ambulatory Visit: Payer: Self-pay

## 2019-12-31 DIAGNOSIS — M25651 Stiffness of right hip, not elsewhere classified: Secondary | ICD-10-CM | POA: Diagnosis not present

## 2019-12-31 DIAGNOSIS — M25561 Pain in right knee: Secondary | ICD-10-CM

## 2019-12-31 DIAGNOSIS — S83241A Other tear of medial meniscus, current injury, right knee, initial encounter: Secondary | ICD-10-CM | POA: Diagnosis not present

## 2020-01-03 ENCOUNTER — Other Ambulatory Visit: Payer: Self-pay

## 2020-01-03 ENCOUNTER — Encounter: Payer: Self-pay | Admitting: Family Medicine

## 2020-01-03 ENCOUNTER — Ambulatory Visit (INDEPENDENT_AMBULATORY_CARE_PROVIDER_SITE_OTHER): Payer: BC Managed Care – PPO | Admitting: Family Medicine

## 2020-01-03 DIAGNOSIS — M7050 Other bursitis of knee, unspecified knee: Secondary | ICD-10-CM | POA: Diagnosis not present

## 2020-01-03 DIAGNOSIS — M1711 Unilateral primary osteoarthritis, right knee: Secondary | ICD-10-CM | POA: Diagnosis not present

## 2020-01-03 MED ORDER — DICLOFENAC SODIUM 2 % EX SOLN: 2.0000 g | Freq: Two times a day (BID) | CUTANEOUS | 3 refills | Status: DC

## 2020-01-03 NOTE — Assessment & Plan Note (Signed)
Injection given today.  New problem found on MRI that was previously undiagnosed.  Topical anti-inflammatories given, I do think patient's underlying arthritic changes and meniscal tear likely are contributing to some of the pain as well.  Due to the instability of the knee and the abnormal thigh to calf ratio we will give patient custom OA stability brace.  Follow-up again in 4 to 8 weeks

## 2020-01-03 NOTE — Assessment & Plan Note (Signed)
Medial meniscus.  Discussed which activities to do which wants to avoid.  Discussed topical anti-inflammatories and prescribed.  Discussed which activities to do which wants to avoid.  Topical anti-inflammatories.  Could be a candidate for viscosupplementation we will try to get approval if necessary.  Patient also secondary to instability and abnormal thigh to calf ratio we will try to get an OA stability brace.  Follow-up again in 4 to 8 weeks

## 2020-01-03 NOTE — Progress Notes (Signed)
[place] Phone: [phone] Subjective:   Tiffany Montes, am serving as a scribe for Dr. Hulan Saas. This visit occurred during the SARS-CoV-2 public health emergency.  Safety protocols were in place, including screening questions prior to the visit, additional usage of staff PPE, and extensive cleaning of exam room while observing appropriate contact time as indicated for disinfecting solutions.   I'm seeing this patient by the request  of:  Eulas Post, MD  CC: Right knee pain follow-up  PJA:[id number]  Tiffany Montes is a 64 y.o. female coming in with complaint of right knee pain. Patient here for MRI results. History of Her2 + breast cancer. Ended treatments in December 2020.  Patient fell in January 2021 in her kitchen. Antalgic gait due to pain in right knee.  Patient's MRI was independently visualized by me. IMPRESSION: 1. Medial meniscus tear as detailed above. 2. Intact ligamentous structures and Montes acute bony findings. 3. Tricompartmental degenerative changes most significant in the medial compartment.  Moderate to severe in the medial compartment 4. Small joint effusion and small Baker's cyst. 5. MCL and pes anserine bursitis.  Patient initially saw another provider and was given an injection with minimal improvement.  Unable to do the exercises secondary to pain and patient feels like increasing instability    Past Medical History:  Diagnosis Date  . Anxiety   . Attention deficit disorder   . Breast cancer ([place]) 2019   Left Breast Cancer  . Depression   . Gallstones 09/05/2011  . Hyperlipidemia   . Hypertension   . Hypothyroidism   . Migraines   . Personal history of chemotherapy 2019   Left Breast Cancer  . Personal history of radiation therapy 2019   Left Breast Cancer  . PONV (postoperative nausea and vomiting)    diffficulty waking up  . Thyroid disease    hypothyroid    Past Surgical History:  Procedure Laterality Date  . ABDOMINAL HYSTERECTOMY  2003  . BREAST BIOPSY Left 2014   benign  . BREAST LUMPECTOMY Left 09/07/2018  . BREAST LUMPECTOMY WITH RADIOACTIVE SEED AND SENTINEL LYMPH NODE BIOPSY Left 09/07/2018   Procedure: LEFT BREAST LUMPECTOMY WITH RADIOACTIVE SEED AND AXILLARY SENTINEL LYMPH NODE BIOPSY,INJECT BLUE DYE LEFT BREAST;  Surgeon: Fanny Skates, MD;  Location: [facility];  Service: General;  Laterality: Left;  . CHOLECYSTECTOMY  09/26/2011   Procedure: LAPAROSCOPIC CHOLECYSTECTOMY WITH INTRAOPERATIVE CHOLANGIOGRAM;  Surgeon: Haywood Lasso, MD;  Location: [facility];  Service: General;  Laterality: N/A;  . COLONOSCOPY    . CYSTIC HYGROMA EXCISION    . DENTAL SURGERY     skin graft from top of mouth to lower gums  . PORT-A-CATH REMOVAL Right 11/04/2019   Procedure: REMOVAL PORT-A-CATH;  Surgeon: Donnie Mesa, MD;  Location: [facility];  Service: General;  Laterality: Right;  . PORTACATH PLACEMENT N/A 09/07/2018   Procedure: INSERTION PORT-A-CATH WITH [place];  Surgeon: Fanny Skates, MD;  Location: [facility];  Service: General;  Laterality: N/A;   Social History   Socioeconomic History  . Marital status: Divorced    Spouse name: Not on file  . Number of children: Not on file  . Years of education: Not on file  . Highest education level: Not on file  Occupational History  . Not on file  Tobacco Use  . Smoking status: Never Smoker  . Smokeless tobacco: Never Used  Substance and Sexual Activity  .  Alcohol use: Yes    Comment: 1-2 a week and reports not every week  . Drug use: Montes  . Sexual activity: Not Currently    Birth control/protection: Surgical  Other Topics Concern  . Not on file  Social History Narrative  . Not on file   Social Determinants of Health   Financial Resource Strain:   . Difficulty of Paying Living Expenses:   Food Insecurity:   . Worried About Charity fundraiser in the Last Year:   . Academic librarian in the Last Year:   Transportation Needs:   . Film/video editor (Medical):   Marland Kitchen Lack of Transportation (Non-Medical):   Physical Activity:   . Days of Exercise per Week:   . Minutes of Exercise per Session:   Stress:   . Feeling of Stress :   Social Connections:   . Frequency of Communication with Friends and Family:   . Frequency of Social Gatherings with Friends and Family:   . Attends Religious Services:   . Active Member of Clubs or Organizations:   . Attends Archivist Meetings:   Marland Kitchen Marital Status:    Allergies  Allergen Reactions  . Morphine Sulfate Nausea And Vomiting and Rash    GI upset   Family History  Problem Relation Age of Onset  . Diabetes Father   . Heart disease Father   . Lung cancer Father   . Breast cancer Neg Hx   . Ovarian cancer Neg Hx     Current Outpatient Medications (Endocrine & Metabolic):  .  levothyroxine (SYNTHROID) 125 MCG tablet, TAKE 1 TABLET BY MOUTH EVERY DAY  Current Outpatient Medications (Cardiovascular):  .  losartan-hydrochlorothiazide (HYZAAR) 100-12.5 MG tablet, Take 1 tablet by mouth daily. .  rosuvastatin (CRESTOR) 20 MG tablet, TAKE 1 TABLET BY MOUTH EVERY DAY  Current Outpatient Medications (Respiratory):  .  fluticasone (FLONASE) 50 MCG/ACT nasal spray, USE 2 SPRAYS IN EACH NOSTRIL DAILY AS NEEDED FOR ALLERGIES  Current Outpatient Medications (Analgesics):  Marland Kitchen  SUMAtriptan (IMITREX) 100 MG tablet, TAKE 1 TABLET BY AS NEEDED FOR MIGRAINE.MAY REPEAT IN 24 HOURS AS DIRECTED .  traMADol (ULTRAM) 50 MG tablet, Take 1 tablet (50 mg total) by mouth every 8 (eight) hours as needed for severe pain.   Current Outpatient Medications (Other):  .  amphetamine-dextroamphetamine (ADDERALL XR) 30 MG 24 hr capsule, Take 1 capsule (30 mg total) by mouth every morning. Marland Kitchen  anastrozole (ARIMIDEX) 1 MG tablet, Take 1 tablet (1 mg total) by mouth daily. Marland Kitchen  augmented betamethasone dipropionate (DIPROLENE-AF) 0.05 % cream,  Apply 1 application topically 2 (two) times daily as needed (for eczema). Marland Kitchen  BIOTIN PO, Take 1 tablet by mouth daily. Marland Kitchen  buPROPion (WELLBUTRIN XL) 300 MG 24 hr tablet, TAKE 1 TABLET BY MOUTH EVERY DAY .  Cholecalciferol (VITAMIN D3 PO), Take 1 tablet by mouth daily. .  clonazePAM (KLONOPIN) 0.5 MG tablet, TAKE 1 TABLET BY MOUTH EVERY DAY AT BEDTIME AS NEEDED .  gabapentin (NEURONTIN) 300 MG capsule, TAKE 1 CAPSULE BY MOUTH EVERYDAY AT BEDTIME .  Multiple Vitamins-Minerals (MULTIVITAMINS THER. W/MINERALS) TABS, Take 1 tablet by mouth daily.   .  sertraline (ZOLOFT) 100 MG tablet, Take 1 tablet (100 mg total) by mouth daily. .  Diclofenac Sodium 2 % SOLN, Place 2 g onto the skin 2 (two) times daily.   Reviewed prior external information including notes and imaging from  primary care provider As well as notes that  were available from care everywhere and other healthcare systems.  Past medical history, social, surgical and family history all reviewed in electronic medical record.  Montes pertanent information unless stated regarding to the chief complaint.   Review of Systems:  Montes headache, visual changes, nausea, vomiting, diarrhea, constipation, dizziness, abdominal pain, skin rash, fevers, chills, night sweats, weight loss, swollen lymph nodes, body aches,chest pain, shortness of breath, mood changes. POSITIVE muscle aches, joint swelling  Objective  Blood pressure 116/76, pulse 81, height 5' 2"  (1.575 m), weight 219 lb (99.3 kg), SpO2 98 %.   General: Montes apparent distress alert and oriented x3 mood and affect normal, dressed appropriately.  HEENT: Pupils equal, extraocular movements intact  Respiratory: Patient's speak in full sentences and does not appear short of breath  Cardiovascular: Montes lower extremity edema, non tender, Montes erythema  Neuro: Cranial nerves II through XII are intact, neurovascularly intact in all extremities with 2+ DTRs and 2+ pulses.  Gait antalgic gait Knee:  Right valgus deformity noted.  Abnormal thigh to calf ratio.  Tender to palpation over medial but more pain over the Pez anserine area ROM full in flexion and extension and lower leg rotation. instability with valgus force.  Moderate painful patellar compression. Patellar glide with moderate crepitus. Patellar and quadriceps tendons unremarkable. Hamstring and quadriceps strength is normal. Contralateral knee shows mild arthritic changes as well but Montes instability  After verbal consent patient was prepped with alcohol swabs and with a 25-gauge half inch needle injected with 1 cc of 0.5% Marcaine and 1 cc of Kenalog 40 mg/mL into the Pez anserine area.  Montes blood loss.  Postinjection instructions given.   Impression and Recommendations:     This case required medical decision making of moderate complexity. The above documentation has been reviewed and is accurate and complete Lyndal Pulley, DO       Note: This dictation was prepared with Dragon dictation along with smaller phrase technology. Any transcriptional errors that result from this process are unintentional.

## 2020-01-03 NOTE — Patient Instructions (Signed)
Injected pes anserine today Thurmond Butts will call you about custom knee brace Pennsaid 2x daily fingertip sized amount Will try to get gel approved See me again in 4 weeks

## 2020-01-12 DIAGNOSIS — M1712 Unilateral primary osteoarthritis, left knee: Secondary | ICD-10-CM | POA: Diagnosis not present

## 2020-01-17 ENCOUNTER — Ambulatory Visit: Payer: BC Managed Care – PPO | Admitting: Family Medicine

## 2020-01-18 ENCOUNTER — Ambulatory Visit (INDEPENDENT_AMBULATORY_CARE_PROVIDER_SITE_OTHER): Payer: BC Managed Care – PPO | Admitting: Family Medicine

## 2020-01-18 ENCOUNTER — Encounter: Payer: Self-pay | Admitting: Family Medicine

## 2020-01-18 ENCOUNTER — Other Ambulatory Visit: Payer: Self-pay

## 2020-01-18 VITALS — BP 116/66 | HR 89 | Temp 97.9°F | Wt 217.6 lb

## 2020-01-18 DIAGNOSIS — R739 Hyperglycemia, unspecified: Secondary | ICD-10-CM

## 2020-01-18 DIAGNOSIS — E785 Hyperlipidemia, unspecified: Secondary | ICD-10-CM

## 2020-01-18 DIAGNOSIS — F988 Other specified behavioral and emotional disorders with onset usually occurring in childhood and adolescence: Secondary | ICD-10-CM | POA: Diagnosis not present

## 2020-01-18 LAB — LIPID PANEL
Cholesterol: 155 mg/dL (ref 0–200)
HDL: 58.7 mg/dL (ref >39.00)
LDL Cholesterol: 84 mg/dL (ref 0–99)
NonHDL: 96.12
Total CHOL/HDL Ratio: 3
Triglycerides: 62 mg/dL (ref 0.0–149.0)
VLDL: 12.4 mg/dL (ref 0.0–40.0)

## 2020-01-18 LAB — HEMOGLOBIN A1C: Hgb A1c MFr Bld: 6.1 % (ref 4.6–6.5)

## 2020-01-18 MED ORDER — AMPHETAMINE-DEXTROAMPHET ER 30 MG PO CP24: 30.0000 mg | ORAL_CAPSULE | ORAL | 0 refills | Status: DC

## 2020-01-18 NOTE — Progress Notes (Signed)
Subjective:     Patient ID: Tiffany Montes, female   DOB: 28-Jun-1956, 64 y.o.   MRN: [id number]  HPI [name] has chronic problems including history of hyperlipidemia, obesity, attention deficit disorder, breast cancer, history of prediabetes, hypertension, hypothyroidism.  She has recently had some ongoing knee problems following a fall.  She has evidence for medial meniscus tear from imaging.  She also had recent Pez anserine bursitis injection.  Her bursitis pain has improved somewhat.  She continues to have some knee pain is followed by sports medicine.  She has hyperlipidemia.  She had cholesterol several months ago 267 with LDL cholesterol 179.  We started her on Crestor 20 mg daily and she is tolerating well with no side effects.  She is overdue for repeat lipids.  She did have recent comprehensive metabolic panel per oncology and hepatic function was normal.  She takes Adderall for her ADD and is due for refills.  No major insomnia issues currently.  She has had some weight gain over recent years and realizes this is contributing to her increased difficulties getting around.  She is considering weight loss options.  She does have history of prediabetes range blood sugars.  No recent A1c  Past Medical History:  Diagnosis Date  . Anxiety   . Attention deficit disorder   . Breast cancer ([facility] [facility]) 2019   Left Breast Cancer  . Depression   . Gallstones 09/05/2011  . Hyperlipidemia   . Hypertension   . Hypothyroidism   . Migraines   . Personal history of chemotherapy 2019   Left Breast Cancer  . Personal history of radiation therapy 2019   Left Breast Cancer  . PONV (postoperative nausea and vomiting)    diffficulty waking up  . Thyroid disease    hypothyroid   Past Surgical History:  Procedure Laterality Date  . ABDOMINAL HYSTERECTOMY  2003  . BREAST BIOPSY Left 2014   benign  . BREAST LUMPECTOMY Left 09/07/2018  . BREAST LUMPECTOMY WITH RADIOACTIVE SEED AND SENTINEL LYMPH  NODE BIOPSY Left 09/07/2018   Procedure: LEFT BREAST LUMPECTOMY WITH RADIOACTIVE SEED AND AXILLARY SENTINEL LYMPH NODE BIOPSY,INJECT BLUE DYE LEFT BREAST;  Surgeon: Fanny Skates, MD;  Location: [facility];  Service: General;  Laterality: Left;  . CHOLECYSTECTOMY  09/26/2011   Procedure: LAPAROSCOPIC CHOLECYSTECTOMY WITH INTRAOPERATIVE CHOLANGIOGRAM;  Surgeon: [place]wood Lasso, MD;  Location: [facility];  Service: General;  Laterality: N/A;  . COLONOSCOPY    . CYSTIC HYGROMA EXCISION    . DENTAL SURGERY     skin graft from top of mouth to lower gums  . PORT-A-CATH REMOVAL Right 11/04/2019   Procedure: REMOVAL PORT-A-CATH;  Surgeon: Donnie Mesa, MD;  Location: [facility];  Service: General;  Laterality: Right;  . PORTACATH PLACEMENT N/A 09/07/2018   Procedure: INSERTION PORT-A-CATH WITH [place];  Surgeon: Fanny Skates, MD;  Location: [facility];  Service: General;  Laterality: N/A;    reports that she has never smoked. She has never used smokeless tobacco. She reports current alcohol use. She reports that she does not use drugs. family history includes Diabetes in her father; Heart disease in her father; Lung cancer in her father. Allergies  Allergen Reactions  . Morphine Sulfate Nausea And Vomiting and Rash    GI upset     Review of Systems  Constitutional: Positive for fatigue. Negative for appetite change, fever and unexpected weight change.  Eyes: Negative for visual disturbance.  Respiratory: Negative for cough, chest tightness, shortness of breath  and wheezing.   Cardiovascular: Negative for chest pain, palpitations and leg swelling.  Gastrointestinal: Negative for abdominal pain.  Genitourinary: Negative for dysuria.  Neurological: Negative for dizziness, seizures, syncope, weakness, light-headedness and headaches.       Objective:   Physical Exam Vitals reviewed.  Constitutional:      Appearance: Normal appearance.  Cardiovascular:     Rate and Rhythm: Normal rate and  regular rhythm.  Pulmonary:     Effort: Pulmonary effort is normal.     Breath sounds: Normal breath sounds.  Musculoskeletal:     Right lower leg: No edema.     Left lower leg: No edema.  Neurological:     Mental Status: She is alert.        Assessment:     #1 dyslipidemia.  Patient started on Crestor several months ago with no follow-up lipids since then  #2 adult attention deficit disorder.  Stable on Adderall XR and patient due for refills  #3 obesity-  #4 history of prediabetes range blood sugars.    Plan:     -Check further labs with lipid panel and hemoglobin A1c  -Refill Adderall  -Strongly encouraged to lose some weight.  If blood sugars elevating would consider possibilities such as Rybelsus if we can get this covered by her insurance  -Recommend schedule follow-up in 3 months to reassess  Eulas Post MD [facility]

## 2020-01-18 NOTE — Patient Instructions (Signed)
Hyperglycemia Hyperglycemia occurs when the level of sugar (glucose) in the blood is too high. Glucose is a type of sugar that provides the body's main source of energy. Certain hormones (insulin and glucagon) control the level of glucose in the blood. Insulin lowers blood glucose, and glucagon increases blood glucose. Hyperglycemia can result from having too little insulin in the bloodstream, or from the body not responding normally to insulin. Hyperglycemia occurs most often in people who have diabetes (diabetes mellitus), but it can happen in people who do not have diabetes. It can develop quickly, and it can be life-threatening if it causes you to become severely dehydrated (diabetic ketoacidosis or hyperglycemic hyperosmolar state). Severe hyperglycemia is a medical emergency. What are the causes? If you have diabetes, hyperglycemia may be caused by:  Diabetes medicine.  Medicines that increase blood glucose or affect your diabetes control.  Not eating enough, or not eating often enough.  Changes in physical activity level.  Being sick or having an infection. If you have prediabetes or undiagnosed diabetes:  Hyperglycemia may be caused by those conditions. If you do not have diabetes, hyperglycemia may be caused by:  Certain medicines, including steroid medicines, beta-blockers, epinephrine, and thiazide diuretics.  Stress.  Serious illness.  Surgery.  Diseases of the pancreas.  Infection. What increases the risk? Hyperglycemia is more likely to develop in people who have risk factors for diabetes, such as:  Having a family member with diabetes.  Having a gene for type 1 diabetes that is passed from parent to child (inherited).  Living in an area with cold weather conditions.  Exposure to certain viruses.  Certain conditions in which the body's disease-fighting (immune) system attacks itself (autoimmune disorders).  Being overweight or obese.  Having an inactive  (sedentary) lifestyle.  Having been diagnosed with insulin resistance.  Having a history of prediabetes, gestational diabetes, or polycystic ovarian syndrome (PCOS).  Being of American-Indian, African-American, Hispanic/Latino, or Asian/Pacific Islander descent. What are the signs or symptoms? Hyperglycemia may not cause any symptoms. If you do have symptoms, they may include early warning signs, such as:  Increased thirst.  Hunger.  Feeling very tired.  Needing to urinate more often than usual.  Blurry vision. Other symptoms may develop if hyperglycemia gets worse, such as:  Dry mouth.  Loss of appetite.  Fruity-smelling breath.  Weakness.  Unexpected or rapid weight gain or weight loss.  Tingling or numbness in the hands or feet.  Headache.  Skin that does not quickly return to normal after being lightly pinched and released (poor skin turgor).  Abdominal pain.  Cuts or bruises that are slow to heal. How is this diagnosed? Hyperglycemia is diagnosed with a blood test to measure your blood glucose level. This blood test is usually done while you are having symptoms. Your health care provider may also do a physical exam and review your medical history. You may have more tests to determine the cause of your hyperglycemia, such as:  A fasting blood glucose (FBG) test. You will not be allowed to eat (you will fast) for at least 8 hours before a blood sample is taken.  An A1c (hemoglobin A1c) blood test. This provides information about blood glucose control over the previous 2-3 months.  An oral glucose tolerance test (OGTT). This measures your blood glucose at two times: ? After fasting. This is your baseline blood glucose level. ? Two hours after drinking a beverage that contains glucose. How is this treated? Treatment depends on the cause   of your hyperglycemia. Treatment may include:  Taking medicine to regulate your blood glucose levels. If you take insulin or  other diabetes medicines, your medicine or dosage may be adjusted.  Lifestyle changes, such as exercising more, eating healthier foods, or losing weight.  Treating an illness or infection, if this caused your hyperglycemia.  Checking your blood glucose more often.  Stopping or reducing steroid medicines, if these caused your hyperglycemia. If your hyperglycemia becomes severe and it results in hyperglycemic hyperosmolar state, you must be hospitalized and given IV fluids. Follow these instructions at home:  General instructions  Take over-the-counter and prescription medicines only as told by your health care provider.  Do not use any products that contain nicotine or tobacco, such as cigarettes and e-cigarettes. If you need help quitting, ask your health care provider.  Limit alcohol intake to no more than 1 drink per day for nonpregnant women and 2 drinks per day for men. One drink equals 12 oz of beer, 5 oz of wine, or 1 oz of hard liquor.  Learn to manage stress. If you need help with this, ask your health care provider.  Keep all follow-up visits as told by your health care provider. This is important. Eating and drinking   Maintain a healthy weight.  Exercise regularly, as directed by your health care provider.  Stay hydrated, especially when you exercise, get sick, or spend time in hot temperatures.  Eat healthy foods, such as: ? Lean proteins. ? Complex carbohydrates. ? Fresh fruits and vegetables. ? Low-fat dairy products. ? Healthy fats.  Drink enough fluid to keep your urine clear or pale yellow. If you have diabetes:  Make sure you know the symptoms of hyperglycemia.  Follow your diabetes management plan, as told by your health care provider. Make sure you: ? Take your insulin and medicines as directed. ? Follow your exercise plan. ? Follow your meal plan. Eat on time, and do not skip meals. ? Check your blood glucose as often as directed. Make sure to  check your blood glucose before and after exercise. If you exercise longer or in a different way than usual, check your blood glucose more often. ? Follow your sick day plan whenever you cannot eat or drink normally. Make this plan in advance with your health care provider.  Share your diabetes management plan with people in your workplace, school, and household.  Check your urine for ketones when you are ill and as told by your health care provider.  Carry a medical alert card or wear medical alert jewelry. Contact a health care provider if:  Your blood glucose is at or above 240 mg/dL (13.3 mmol/L) for 2 days in a row.  You have problems keeping your blood glucose in your target range.  You have frequent episodes of hyperglycemia. Get help right away if:  You have difficulty breathing.  You have a change in how you think, feel, or act (mental status).  You have nausea or vomiting that does not go away. These symptoms may represent a serious problem that is an emergency. Do not wait to see if the symptoms will go away. Get medical help right away. Call your local emergency services (911 in the [place] Do not drive yourself to the hospital. Summary  Hyperglycemia occurs when the level of sugar (glucose) in the blood is too high.  Hyperglycemia is diagnosed with a blood test to measure your blood glucose level. This blood test is usually done while you are   having symptoms. Your health care provider may also do a physical exam and review your medical history.  If you have diabetes, follow your diabetes management plan as told by your health care provider.  Contact your health care provider if you have problems keeping your blood glucose in your target range. This information is not intended to replace advice given to you by your health care provider. Make sure you discuss any questions you have with your health care provider. Document Revised: 06/10/2016 Document Reviewed:  06/10/2016 Elsevier Patient Education  2020 [employer]  

## 2020-01-19 ENCOUNTER — Encounter: Payer: Self-pay | Admitting: Family Medicine

## 2020-01-19 ENCOUNTER — Ambulatory Visit (INDEPENDENT_AMBULATORY_CARE_PROVIDER_SITE_OTHER): Payer: BC Managed Care – PPO | Admitting: Family Medicine

## 2020-01-19 DIAGNOSIS — M1711 Unilateral primary osteoarthritis, right knee: Secondary | ICD-10-CM | POA: Diagnosis not present

## 2020-01-19 MED ORDER — DUROLANE 60 MG/3ML IX PRSY: 1.0000 | PREFILLED_SYRINGE | Freq: Once | INTRA_ARTICULAR | 0 refills | Status: AC

## 2020-01-19 NOTE — Assessment & Plan Note (Signed)
Chronic problem with exacerbation.  Injection given today, tolerated the procedure well, discussed icing regimen and home exercise, which activities to do which wants to avoid.  Increase activity slowly over the course the next several weeks.  We will try to send patient's viscosupplementation order to the pharmacy and see if they can get approval this way.  Discussed the potential for PRP.  Patient likely will need surgical intervention at some point but she would like to try to avoid it if possible.  Follow-up again 4 to 6 weeks

## 2020-01-19 NOTE — Progress Notes (Signed)
[facility] [address] [place] Phone: [phone] Subjective:   I Tiffany Montes am serving as a Education administrator for Dr. Hulan Saas.  This visit occurred during the SARS-CoV-2 public health emergency.  Safety protocols were in place, including screening questions prior to the visit, additional usage of staff PPE, and extensive cleaning of exam room while observing appropriate contact time as indicated for disinfecting solutions.   I'm seeing this patient by the request  of:  Eulas Post, MD  CC: Right knee pain  [id number]   01/03/2019 Medial meniscus.  Discussed which activities to do which wants to avoid.  Discussed topical anti-inflammatories and prescribed.  Discussed which activities to do which wants to avoid.  Topical anti-inflammatories.  Could be a candidate for viscosupplementation we will try to get approval if necessary.  Patient also secondary to instability and abnormal thigh to calf ratio we will try to get an OA stability brace.  Follow-up again in 4 to 8 weeks  Injection given today.  New problem found on MRI that was previously undiagnosed.  Topical anti-inflammatories given, I do think patient's underlying arthritic changes and meniscal tear likely are contributing to some of the pain as well.  Due to the instability of the knee and the abnormal thigh to calf ratio we will give patient custom OA stability brace.  Follow-up again in 4 to 8 weeks  01/19/2020 Tiffany Montes is a 64 y.o. female coming in with complaint of right knee pain. Patient states she is not doing well. Last injection felt good. This past weekend she was out in her yard and noticed the uneven ground hurt the knee.  Unfortunately patient's insurance did not approve the gel injections at the moment.     Past Medical History:  Diagnosis Date  . Anxiety   . Attention deficit disorder   . Breast cancer ([place]) 2019   Left Breast Cancer  .  Depression   . Gallstones 09/05/2011  . Hyperlipidemia   . Hypertension   . Hypothyroidism   . Migraines   . Personal history of chemotherapy 2019   Left Breast Cancer  . Personal history of radiation therapy 2019   Left Breast Cancer  . PONV (postoperative nausea and vomiting)    diffficulty waking up  . Thyroid disease    hypothyroid   Past Surgical History:  Procedure Laterality Date  . ABDOMINAL HYSTERECTOMY  2003  . BREAST BIOPSY Left 2014   benign  . BREAST LUMPECTOMY Left 09/07/2018  . BREAST LUMPECTOMY WITH RADIOACTIVE SEED AND SENTINEL LYMPH NODE BIOPSY Left 09/07/2018   Procedure: LEFT BREAST LUMPECTOMY WITH RADIOACTIVE SEED AND AXILLARY SENTINEL LYMPH NODE BIOPSY,INJECT BLUE DYE LEFT BREAST;  Surgeon: Fanny Skates, MD;  Location: [facility];  Service: General;  Laterality: Left;  . CHOLECYSTECTOMY  09/26/2011   Procedure: LAPAROSCOPIC CHOLECYSTECTOMY WITH INTRAOPERATIVE CHOLANGIOGRAM;  Surgeon: Haywood Lasso, MD;  Location: [facility];  Service: General;  Laterality: N/A;  . COLONOSCOPY    . CYSTIC HYGROMA EXCISION    . DENTAL SURGERY     skin graft from top of mouth to lower gums  . PORT-A-CATH REMOVAL Right 11/04/2019   Procedure: REMOVAL PORT-A-CATH;  Surgeon: Donnie Mesa, MD;  Location: [facility];  Service: General;  Laterality: Right;  . PORTACATH PLACEMENT N/A 09/07/2018   Procedure: INSERTION PORT-A-CATH WITH [place];  Surgeon: Fanny Skates, MD;  Location: [facility];  Service: General;  Laterality: N/A;   Social  History   Socioeconomic History  . Marital status: Divorced    Spouse name: Not on file  . Number of children: Not on file  . Years of education: Not on file  . Highest education level: Not on file  Occupational History  . Not on file  Tobacco Use  . Smoking status: Never Smoker  . Smokeless tobacco: Never Used  Substance and Sexual Activity  . Alcohol use: Yes    Comment: 1-2 a week and reports not every week  . Drug use: No  .  Sexual activity: Not Currently    Birth control/protection: Surgical  Other Topics Concern  . Not on file  Social History Narrative  . Not on file   Social Determinants of Health   Financial Resource Strain:   . Difficulty of Paying Living Expenses:   Food Insecurity:   . Worried About Charity fundraiser in the Last Year:   . Arboriculturist in the Last Year:   Transportation Needs:   . Film/video editor (Medical):   Marland Kitchen Lack of Transportation (Non-Medical):   Physical Activity:   . Days of Exercise per Week:   . Minutes of Exercise per Session:   Stress:   . Feeling of Stress :   Social Connections:   . Frequency of Communication with Friends and Family:   . Frequency of Social Gatherings with Friends and Family:   . Attends Religious Services:   . Active Member of Clubs or Organizations:   . Attends Archivist Meetings:   Marland Kitchen Marital Status:    Allergies  Allergen Reactions  . Morphine Sulfate Nausea And Vomiting and Rash    GI upset   Family History  Problem Relation Age of Onset  . Diabetes Father   . Heart disease Father   . Lung cancer Father   . Breast cancer Neg Hx   . Ovarian cancer Neg Hx     Current Outpatient Medications (Endocrine & Metabolic):  .  levothyroxine (SYNTHROID) 125 MCG tablet, TAKE 1 TABLET BY MOUTH EVERY DAY  Current Outpatient Medications (Cardiovascular):  .  losartan-hydrochlorothiazide (HYZAAR) 100-12.5 MG tablet, Take 1 tablet by mouth daily. .  rosuvastatin (CRESTOR) 20 MG tablet, TAKE 1 TABLET BY MOUTH EVERY DAY  Current Outpatient Medications (Respiratory):  .  fluticasone (FLONASE) 50 MCG/ACT nasal spray, USE 2 SPRAYS IN EACH NOSTRIL DAILY AS NEEDED FOR ALLERGIES  Current Outpatient Medications (Analgesics):  Marland Kitchen  SUMAtriptan (IMITREX) 100 MG tablet, TAKE 1 TABLET BY AS NEEDED FOR MIGRAINE.MAY REPEAT IN 24 HOURS AS DIRECTED .  traMADol (ULTRAM) 50 MG tablet, Take 1 tablet (50 mg total) by mouth every 8 (eight) hours  as needed for severe pain.   Current Outpatient Medications (Other):  .  amphetamine-dextroamphetamine (ADDERALL XR) 30 MG 24 hr capsule, Take 1 capsule (30 mg total) by mouth every morning. Marland Kitchen  anastrozole (ARIMIDEX) 1 MG tablet, Take 1 tablet (1 mg total) by mouth daily. Marland Kitchen  augmented betamethasone dipropionate (DIPROLENE-AF) 0.05 % cream, Apply 1 application topically 2 (two) times daily as needed (for eczema). Marland Kitchen  BIOTIN PO, Take 1 tablet by mouth daily. Marland Kitchen  buPROPion (WELLBUTRIN XL) 300 MG 24 hr tablet, TAKE 1 TABLET BY MOUTH EVERY DAY .  Cholecalciferol (VITAMIN D3 PO), Take 1 tablet by mouth daily. .  clonazePAM (KLONOPIN) 0.5 MG tablet, TAKE 1 TABLET BY MOUTH EVERY DAY AT BEDTIME AS NEEDED .  Diclofenac Sodium 2 % SOLN, Place 2 g onto the  skin 2 (two) times daily. Marland Kitchen  gabapentin (NEURONTIN) 300 MG capsule, TAKE 1 CAPSULE BY MOUTH EVERYDAY AT BEDTIME .  Multiple Vitamins-Minerals (MULTIVITAMINS THER. W/MINERALS) TABS, Take 1 tablet by mouth daily.   .  sertraline (ZOLOFT) 100 MG tablet, Take 1 tablet (100 mg total) by mouth daily. .  Sodium Hyaluronate (DUROLANE) 60 MG/3ML PRSY, Inject 60 mg into the articular space once for 1 dose.   Reviewed prior external information including notes and imaging from  primary care provider As well as notes that were available from care everywhere and other healthcare systems.  Past medical history, social, surgical and family history all reviewed in electronic medical record.  No pertanent information unless stated regarding to the chief complaint.   Review of Systems:  No headache, visual changes, nausea, vomiting, diarrhea, constipation, dizziness, abdominal pain, skin rash, fevers, chills, night sweats, weight loss, swollen lymph nodes, body aches, joint swelling, chest pain, shortness of breath, mood changes. POSITIVE muscle aches  Objective  Blood pressure 110/80, pulse 75, height 5\' 2"  (1.575 m), weight 216 lb (98 kg), SpO2 95 %.   General: No  apparent distress alert and oriented x3 mood and affect normal, dressed appropriately.  HEENT: Pupils equal, extraocular movements intact  Respiratory: Patient's speak in full sentences and does not appear short of breath  Cardiovascular: No lower extremity edema, non tender, no erythema  Neuro: Cranial nerves II through XII are intact, neurovascularly intact in all extremities with 2+ DTRs and 2+ pulses.  Gait antalgic gait MSK:  Knee: Right valgus deformity noted.  Abnormal thigh to calf ratio.  Tender to palpation over medial and PF joint line.  See if you get less pain over the Pez anserine than previous exam ROM full in flexion and extension and lower leg rotation. instability with valgus force.  painful patellar compression. Patellar glide with moderate crepitus. Patellar and quadriceps tendons unremarkable. Hamstring and quadriceps strength is normal.  After informed written and verbal consent, patient was seated on exam table. Right knee was prepped with alcohol swab and utilizing anterolateral approach, patient's right knee space was injected with 4:1  marcaine 0.5%: Kenalog 40mg /dL. Patient tolerated the procedure well without immediate complications.   Impression and Recommendations:     This case required medical decision making of moderate complexity. The above documentation has been reviewed and is accurate and complete Lyndal Pulley, DO       Note: This dictation was prepared with Dragon dictation along with smaller phrase technology. Any transcriptional errors that result from this process are unintentional.

## 2020-01-19 NOTE — Patient Instructions (Addendum)
Good to see you Injection today See me again 4-6 weeks

## 2020-02-02 ENCOUNTER — Telehealth: Payer: Self-pay | Admitting: Family Medicine

## 2020-02-02 ENCOUNTER — Ambulatory Visit: Payer: BC Managed Care – PPO | Admitting: Family Medicine

## 2020-02-02 ENCOUNTER — Other Ambulatory Visit: Payer: Self-pay | Admitting: Family Medicine

## 2020-02-02 MED ORDER — BENZONATATE 100 MG PO CAPS: 100.0000 mg | ORAL_CAPSULE | Freq: Two times a day (BID) | ORAL | 0 refills | Status: DC | PRN

## 2020-02-02 NOTE — Telephone Encounter (Signed)
Medication sent in. 

## 2020-02-02 NOTE — Telephone Encounter (Signed)
Medication:Benzonatate  Pharmacy: [facility]

## 2020-02-02 NOTE — Telephone Encounter (Signed)
Please advise 

## 2020-02-02 NOTE — Telephone Encounter (Signed)
OK to send in Benzonatate 100 mg 1-2 po q 8 hours prn cough #40.

## 2020-02-02 NOTE — Telephone Encounter (Signed)
Pt called back, the pollen is bothering her and coughing keeps her up at night.

## 2020-02-02 NOTE — Telephone Encounter (Signed)
Patient called requesting a refill on traMADol (ULTRAM) 50 MG tablet. Please advise.

## 2020-02-02 NOTE — Telephone Encounter (Signed)
Lvm to find out why pt needs this refill if having cough or symptoms needs a virtual appt

## 2020-02-03 MED ORDER — TRAMADOL HCL 50 MG PO TABS: 50.0000 mg | ORAL_TABLET | Freq: Three times a day (TID) | ORAL | 0 refills | Status: DC | PRN

## 2020-02-03 NOTE — Telephone Encounter (Signed)
Refilled.  Dr. Tamala Julian should take over this medication as he is managing her knee pain in the future.

## 2020-02-16 ENCOUNTER — Other Ambulatory Visit: Payer: Self-pay

## 2020-02-16 ENCOUNTER — Ambulatory Visit (INDEPENDENT_AMBULATORY_CARE_PROVIDER_SITE_OTHER): Payer: Medicare Other | Admitting: Family Medicine

## 2020-02-16 ENCOUNTER — Encounter: Payer: Self-pay | Admitting: Family Medicine

## 2020-02-16 VITALS — BP 134/84 | HR 85 | Ht 62.0 in | Wt 214.0 lb

## 2020-02-16 DIAGNOSIS — M1711 Unilateral primary osteoarthritis, right knee: Secondary | ICD-10-CM

## 2020-02-16 NOTE — Progress Notes (Signed)
[facility] [address] [place] Phone: [phone] Subjective:   I Tiffany Montes am serving as a Education administrator for Dr. Hulan Saas.  This visit occurred during the SARS-CoV-2 public health emergency.  Safety protocols were in place, including screening questions prior to the visit, additional usage of staff PPE, and extensive cleaning of exam room while observing appropriate contact time as indicated for disinfecting solutions.   I'm seeing this patient by the request  of:  Eulas Post, MD  CC: Right knee pain follow-up  [id number]   01/19/2020 Chronic problem with exacerbation.  Injection given today, tolerated the procedure well, discussed icing regimen and home exercise, which activities to do which wants to avoid.  Increase activity slowly over the course the next several weeks.  We will try to send patient's viscosupplementation order to the pharmacy and see if they can get approval this way.  Discussed the potential for PRP.  Patient likely will need surgical intervention at some point but she would like to try to avoid it if possible.  Follow-up again 4 to 6 weeks  02/16/2020 Tiffany Montes is a 64 y.o. female coming in with complaint of right knee pain. Patient states she is not getting better. Walking causes pain and she has noticed that she is walking differently. Hard for her to enjoy ADLs due to pain. Patient would like to get back active as much as possible.    Patient was sent for an MRI in December 31, 2019.  This was independently visualized by me.  Found to have tricompartmental degenerative changes most severe in the medial compartment with moderate to severe as well as a medial meniscal tear.  Past Medical History:  Diagnosis Date  . Anxiety   . Attention deficit disorder   . Breast cancer ([facility]) 2019   Left Breast Cancer  . Depression   . Gallstones 09/05/2011  . Hyperlipidemia   . Hypertension   .  Hypothyroidism   . Migraines   . Personal history of chemotherapy 2019   Left Breast Cancer  . Personal history of radiation therapy 2019   Left Breast Cancer  . PONV (postoperative nausea and vomiting)    diffficulty waking up  . Thyroid disease    hypothyroid   Past Surgical History:  Procedure Laterality Date  . ABDOMINAL HYSTERECTOMY  2003  . BREAST BIOPSY Left 2014   benign  . BREAST LUMPECTOMY Left 09/07/2018  . BREAST LUMPECTOMY WITH RADIOACTIVE SEED AND SENTINEL LYMPH NODE BIOPSY Left 09/07/2018   Procedure: LEFT BREAST LUMPECTOMY WITH RADIOACTIVE SEED AND AXILLARY SENTINEL LYMPH NODE BIOPSY,INJECT BLUE DYE LEFT BREAST;  Surgeon: Fanny Skates, MD;  Location: [facility];  Service: General;  Laterality: Left;  . CHOLECYSTECTOMY  09/26/2011   Procedure: LAPAROSCOPIC CHOLECYSTECTOMY WITH INTRAOPERATIVE CHOLANGIOGRAM;  Surgeon: Haywood Lasso, MD;  Location: [facility];  Service: General;  Laterality: N/A;  . COLONOSCOPY    . CYSTIC HYGROMA EXCISION    . DENTAL SURGERY     skin graft from top of mouth to lower gums  . PORT-A-CATH REMOVAL Right 11/04/2019   Procedure: REMOVAL PORT-A-CATH;  Surgeon: Donnie Mesa, MD;  Location: [facility];  Service: General;  Laterality: Right;  . PORTACATH PLACEMENT N/A 09/07/2018   Procedure: INSERTION PORT-A-CATH WITH [place];  Surgeon: Fanny Skates, MD;  Location: [facility];  Service: General;  Laterality: N/A;   Social History   Socioeconomic History  . Marital status: Divorced  Spouse name: Not on file  . Number of children: Not on file  . Years of education: Not on file  . Highest education level: Not on file  Occupational History  . Not on file  Tobacco Use  . Smoking status: Never Smoker  . Smokeless tobacco: Never Used  Substance and Sexual Activity  . Alcohol use: Yes    Comment: 1-2 a week and reports not every week  . Drug use: No  . Sexual activity: Not Currently    Birth control/protection: Surgical  Other  Topics Concern  . Not on file  Social History Narrative  . Not on file   Social Determinants of Health   Financial Resource Strain:   . Difficulty of Paying Living Expenses:   Food Insecurity:   . Worried About Charity fundraiser in the Last Year:   . Arboriculturist in the Last Year:   Transportation Needs:   . Film/video editor (Medical):   Marland Kitchen Lack of Transportation (Non-Medical):   Physical Activity:   . Days of Exercise per Week:   . Minutes of Exercise per Session:   Stress:   . Feeling of Stress :   Social Connections:   . Frequency of Communication with Friends and Family:   . Frequency of Social Gatherings with Friends and Family:   . Attends Religious Services:   . Active Member of Clubs or Organizations:   . Attends Archivist Meetings:   Marland Kitchen Marital Status:    Allergies  Allergen Reactions  . Morphine Sulfate Nausea And Vomiting and Rash    GI upset   Family History  Problem Relation Age of Onset  . Diabetes Father   . Heart disease Father   . Lung cancer Father   . Breast cancer Neg Hx   . Ovarian cancer Neg Hx     Current Outpatient Medications (Endocrine & Metabolic):  .  levothyroxine (SYNTHROID) 125 MCG tablet, TAKE 1 TABLET BY MOUTH EVERY DAY  Current Outpatient Medications (Cardiovascular):  .  losartan-hydrochlorothiazide (HYZAAR) 100-12.5 MG tablet, Take 1 tablet by mouth daily. .  rosuvastatin (CRESTOR) 20 MG tablet, TAKE 1 TABLET BY MOUTH EVERY DAY  Current Outpatient Medications (Respiratory):  .  benzonatate (TESSALON) 100 MG capsule, Take 1 capsule (100 mg total) by mouth 2 (two) times daily as needed for cough (1 to 2 every hours as needed). .  fluticasone (FLONASE) 50 MCG/ACT nasal spray, USE 2 SPRAYS IN EACH NOSTRIL DAILY AS NEEDED FOR ALLERGIES  Current Outpatient Medications (Analgesics):  Marland Kitchen  SUMAtriptan (IMITREX) 100 MG tablet, TAKE 1 TABLET BY AS NEEDED FOR MIGRAINE.MAY REPEAT IN 24 HOURS AS DIRECTED .  traMADol  (ULTRAM) 50 MG tablet, Take 1 tablet (50 mg total) by mouth every 8 (eight) hours as needed for severe pain.   Current Outpatient Medications (Other):  .  amphetamine-dextroamphetamine (ADDERALL XR) 30 MG 24 hr capsule, Take 1 capsule (30 mg total) by mouth every morning. Marland Kitchen  anastrozole (ARIMIDEX) 1 MG tablet, Take 1 tablet (1 mg total) by mouth daily. Marland Kitchen  augmented betamethasone dipropionate (DIPROLENE-AF) 0.05 % cream, Apply 1 application topically 2 (two) times daily as needed (for eczema). Marland Kitchen  BIOTIN PO, Take 1 tablet by mouth daily. Marland Kitchen  buPROPion (WELLBUTRIN XL) 300 MG 24 hr tablet, TAKE 1 TABLET BY MOUTH EVERY DAY .  Cholecalciferol (VITAMIN D3 PO), Take 1 tablet by mouth daily. .  clonazePAM (KLONOPIN) 0.5 MG tablet, TAKE 1 TABLET BY MOUTH EVERY  DAY AT BEDTIME AS NEEDED .  Diclofenac Sodium 2 % SOLN, Place 2 g onto the skin 2 (two) times daily. Marland Kitchen  gabapentin (NEURONTIN) 300 MG capsule, TAKE 1 CAPSULE BY MOUTH EVERYDAY AT BEDTIME .  Multiple Vitamins-Minerals (MULTIVITAMINS THER. W/MINERALS) TABS, Take 1 tablet by mouth daily.   .  sertraline (ZOLOFT) 100 MG tablet, Take 1 tablet (100 mg total) by mouth daily.   Reviewed prior external information including notes and imaging from  primary care provider As well as notes that were available from care everywhere and other healthcare systems.  Past medical history, social, surgical and family history all reviewed in electronic medical record.  No pertanent information unless stated regarding to the chief complaint.   Review of Systems:  No headache, visual changes, nausea, vomiting, diarrhea, constipation, dizziness, abdominal pain, skin rash, fevers, chills, night sweats, weight loss, swollen lymph nodes, body aches, joint swelling, chest pain, shortness of breath, mood changes. POSITIVE muscle aches  Objective  Blood pressure 134/84, pulse 85, height 5\' 2"  (1.575 m), weight 214 lb (97.1 kg), SpO2 96 %.   General: No apparent distress  alert and oriented x3 mood and affect normal, dressed appropriately.  HEENT: Pupils equal, extraocular movements intact  Respiratory: Patient's speak in full sentences and does not appear short of breath  Cardiovascular: No lower extremity edema, non tender, no erythema  Neuro: Cranial nerves II through XII are intact, neurovascularly intact in all extremities with 2+ DTRs and 2+ pulses.  Gait antalgic MSK:   Knee: Right valgus deformity noted. Large thigh to calf ratio.  Tender to palpation over medial and PF joint line.  ROM full in flexion and extension and lower leg rotation. instability with valgus force.  painful patellar compression. Patellar glide with moderate crepitus. Patellar and quadriceps tendons unremarkable. Hamstring and quadriceps strength is normal.      Impression and Recommendations:      The above documentation has been reviewed and is accurate and complete Lyndal Pulley, DO       Note: This dictation was prepared with Dragon dictation along with smaller phrase technology. Any transcriptional errors that result from this process are unintentional.

## 2020-02-16 NOTE — Patient Instructions (Signed)
Refer to [employer] Will get gel approval

## 2020-02-16 NOTE — Assessment & Plan Note (Signed)
Discussed with patient at this point.  Patient is having so much pain and instability and affecting daily activities and we decided to hold on the viscosupplementation and likely does need surgical intervention.  Patient is willing to have a potential replacement done.  Patient will be referred today for further evaluation and treatment.

## 2020-02-22 ENCOUNTER — Other Ambulatory Visit: Payer: Self-pay | Admitting: Family Medicine

## 2020-02-22 NOTE — Telephone Encounter (Signed)
Please advise 

## 2020-02-22 NOTE — Telephone Encounter (Signed)
Pt is requesting a refill on Adderall XR 30 mg. Pt uses [facility]. Thanks

## 2020-02-23 MED ORDER — AMPHETAMINE-DEXTROAMPHET ER 30 MG PO CP24
ORAL_CAPSULE | ORAL | 0 refills | Status: DC
Start: 2020-02-23 — End: 2020-05-21

## 2020-02-23 MED ORDER — AMPHETAMINE-DEXTROAMPHET ER 30 MG PO CP24: 30.0000 mg | ORAL_CAPSULE | ORAL | 0 refills | Status: DC

## 2020-02-23 MED ORDER — AMPHETAMINE-DEXTROAMPHET ER 30 MG PO CP24: ORAL_CAPSULE | ORAL | 0 refills | Status: DC

## 2020-03-06 ENCOUNTER — Other Ambulatory Visit: Payer: Self-pay | Admitting: Oncology

## 2020-03-09 ENCOUNTER — Other Ambulatory Visit: Payer: Self-pay

## 2020-03-10 ENCOUNTER — Ambulatory Visit: Payer: Medicare Other | Admitting: Family Medicine

## 2020-03-14 ENCOUNTER — Ambulatory Visit (INDEPENDENT_AMBULATORY_CARE_PROVIDER_SITE_OTHER): Payer: Medicare Other | Admitting: Family Medicine

## 2020-03-14 ENCOUNTER — Telehealth: Payer: Self-pay | Admitting: Family Medicine

## 2020-03-14 ENCOUNTER — Other Ambulatory Visit (INDEPENDENT_AMBULATORY_CARE_PROVIDER_SITE_OTHER): Payer: Medicare Other

## 2020-03-14 ENCOUNTER — Other Ambulatory Visit: Payer: Self-pay

## 2020-03-14 ENCOUNTER — Encounter: Payer: Self-pay | Admitting: Family Medicine

## 2020-03-14 VITALS — BP 134/76 | HR 61 | Temp 97.6°F | Wt 218.4 lb

## 2020-03-14 DIAGNOSIS — E785 Hyperlipidemia, unspecified: Secondary | ICD-10-CM | POA: Diagnosis not present

## 2020-03-14 DIAGNOSIS — Z01818 Encounter for other preprocedural examination: Secondary | ICD-10-CM | POA: Diagnosis not present

## 2020-03-14 DIAGNOSIS — R7303 Prediabetes: Secondary | ICD-10-CM

## 2020-03-14 DIAGNOSIS — I10 Essential (primary) hypertension: Secondary | ICD-10-CM

## 2020-03-14 LAB — CBC WITH DIFFERENTIAL/PLATELET
Basophils Absolute: 0.1 10*3/uL (ref 0.0–0.1)
Basophils Relative: 1.1 % (ref 0.0–3.0)
Eosinophils Absolute: 0.1 10*3/uL (ref 0.0–0.7)
Eosinophils Relative: 2.1 % (ref 0.0–5.0)
HCT: 39.5 % (ref 36.0–46.0)
Hemoglobin: 13.3 g/dL (ref 12.0–15.0)
Lymphocytes Relative: 20.6 % (ref 12.0–46.0)
Lymphs Abs: 1.2 10*3/uL (ref 0.7–4.0)
MCHC: 33.6 g/dL (ref 30.0–36.0)
MCV: 87.6 fl (ref 78.0–100.0)
Monocytes Absolute: 0.4 10*3/uL (ref 0.1–1.0)
Monocytes Relative: 6.8 % (ref 3.0–12.0)
Neutro Abs: 4 10*3/uL (ref 1.4–7.7)
Neutrophils Relative %: 69.4 % (ref 43.0–77.0)
Platelets: 204 10*3/uL (ref 150.0–400.0)
RBC: 4.51 Mil/uL (ref 3.87–5.11)
RDW: 13.8 % (ref 11.5–15.5)
WBC: 5.7 10*3/uL (ref 4.0–10.5)

## 2020-03-14 LAB — BASIC METABOLIC PANEL
BUN: 14 mg/dL (ref 6–23)
CO2: 28 mEq/L (ref 19–32)
Calcium: 9.4 mg/dL (ref 8.4–10.5)
Chloride: 103 mEq/L (ref 96–112)
Creatinine, Ser: 0.72 mg/dL (ref 0.40–1.20)
GFR: 81.27 mL/min (ref >60.00)
Glucose, Bld: 98 mg/dL (ref 70–99)
Potassium: 3.7 mEq/L (ref 3.5–5.1)
Sodium: 139 mEq/L (ref 135–145)

## 2020-03-14 LAB — PROTIME-INR
INR: 1.2 ratio — ABNORMAL HIGH (ref 0.8–1.0)
Prothrombin Time: 13.3 s — ABNORMAL HIGH (ref 9.6–13.1)

## 2020-03-14 NOTE — Telephone Encounter (Signed)
Called [facility] and informed them of blood orders being placed as future

## 2020-03-14 NOTE — Progress Notes (Signed)
Established Patient Office Visit  Subjective:  Patient ID: Tiffany Montes, female    DOB: 1956-02-18  Age: 64 y.o. MRN: [id number]  CC:  Chief Complaint  Patient presents with  . Pre-op Exam    pt is here for surgical clearance she is having a right knee replacement     HPI Tiffany Montes presents for preoperative clearance for right TKR.    Hx of hypertension, hypothyroidism, hyperlipidemia,breast cancer, prediabetes, obesity.  No cardiac hx.  No recent chest pains.  Had several echos during her chemo - most recently 12/20 with normal EF.  Recent A1C in April was 6.1%.   Non-smoker.    Past Medical History:  Diagnosis Date  . Anxiety   . Attention deficit disorder   . Breast cancer ([place]) 2019   Left Breast Cancer  . Depression   . Gallstones 09/05/2011  . Hyperlipidemia   . Hypertension   . Hypothyroidism   . Migraines   . Personal history of chemotherapy 2019   Left Breast Cancer  . Personal history of radiation therapy 2019   Left Breast Cancer  . PONV (postoperative nausea and vomiting)    diffficulty waking up  . Thyroid disease    hypothyroid    Past Surgical History:  Procedure Laterality Date  . ABDOMINAL HYSTERECTOMY  2003  . BREAST BIOPSY Left 2014   benign  . BREAST LUMPECTOMY Left 09/07/2018  . BREAST LUMPECTOMY WITH RADIOACTIVE SEED AND SENTINEL LYMPH NODE BIOPSY Left 09/07/2018   Procedure: LEFT BREAST LUMPECTOMY WITH RADIOACTIVE SEED AND AXILLARY SENTINEL LYMPH NODE BIOPSY,INJECT BLUE DYE LEFT BREAST;  Surgeon: Fanny Skates, MD;  Location: [place];  Service: General;  Laterality: Left;  . CHOLECYSTECTOMY  09/26/2011   Procedure: LAPAROSCOPIC CHOLECYSTECTOMY WITH INTRAOPERATIVE CHOLANGIOGRAM;  Surgeon: Haywood Lasso, MD;  Location: [place];  Service: General;  Laterality: N/A;  . COLONOSCOPY    . CYSTIC HYGROMA EXCISION    . DENTAL SURGERY     skin graft from top of mouth to lower gums  . PORT-A-CATH REMOVAL Right 11/04/2019   Procedure: REMOVAL PORT-A-CATH;  Surgeon: Donnie Mesa, MD;  Location: [place];  Service: General;  Laterality: Right;  . PORTACATH PLACEMENT N/A 09/07/2018   Procedure: INSERTION PORT-A-CATH WITH [place];  Surgeon: Fanny Skates, MD;  Location[facility];  Service: General;  Laterality: N/A;    Family History  Problem Relation Age of Onset  . Diabetes Father   . Heart disease Father   . Lung cancer Father   . Breast cancer Neg Hx   . Ovarian cancer Neg Hx     Social History   Socioeconomic History  . Marital status: Divorced    Spouse name: Not on file  . Number of children: Not on file  . Years of education: Not on file  . Highest education level: Not on file  Occupational History  . Not on file  Tobacco Use  . Smoking status: Never Smoker  . Smokeless tobacco: Never Used  Substance and Sexual Activity  . Alcohol use: Yes    Comment: 1-2 a week and reports not every week  . Drug use: No  . Sexual activity: Not Currently    Birth control/protection: Surgical  Other Topics Concern  . Not on file  Social History Narrative  . Not on file   Social Determinants of Health   Financial Resource Strain:   . Difficulty of Paying Living Expenses:   Food Insecurity:   .  Worried About Charity fundraiser in the Last Year:   . Arboriculturist in the Last Year:   Transportation Needs:   . Film/video editor (Medical):   Marland Kitchen Lack of Transportation (Non-Medical):   Physical Activity:   . Days of Exercise per Week:   . Minutes of Exercise per Session:   Stress:   . Feeling of Stress :   Social Connections:   . Frequency of Communication with Friends and Family:   . Frequency of Social Gatherings with Friends and Family:   . Attends Religious Services:   . Active Member of Clubs or Organizations:   . Attends Archivist Meetings:   Marland Kitchen Marital Status:   Intimate Partner Violence:   . Fear of Current or Ex-Partner:   . Emotionally Abused:   Marland Kitchen  Physically Abused:   . Sexually Abused:     Outpatient Medications Prior to Visit  Medication Sig Dispense Refill  . amphetamine-dextroamphetamine (ADDERALL XR) 30 MG 24 hr capsule Take one capsule by mouth every morning.  May refill in one month. 30 capsule 0  . amphetamine-dextroamphetamine (ADDERALL XR) 30 MG 24 hr capsule Take one capsule by mouth every morning.  May refill in two months. 30 capsule 0  . amphetamine-dextroamphetamine (ADDERALL XR) 30 MG 24 hr capsule Take 1 capsule (30 mg total) by mouth every morning. 30 capsule 0  . anastrozole (ARIMIDEX) 1 MG tablet TAKE 1 TABLET BY MOUTH EVERY DAY 90 tablet 4  . augmented betamethasone dipropionate (DIPROLENE-AF) 0.05 % cream Apply 1 application topically 2 (two) times daily as needed (for eczema). 30 g 2  . benzonatate (TESSALON) 100 MG capsule Take 1 capsule (100 mg total) by mouth 2 (two) times daily as needed for cough (1 to 2 every hours as needed). 40 capsule 0  . BIOTIN PO Take 1 tablet by mouth daily.    Marland Kitchen buPROPion (WELLBUTRIN XL) 300 MG 24 hr tablet TAKE 1 TABLET BY MOUTH EVERY DAY 90 tablet 1  . Cholecalciferol (VITAMIN D3 PO) Take 1 tablet by mouth daily.    . clonazePAM (KLONOPIN) 0.5 MG tablet TAKE 1 TABLET BY MOUTH EVERY DAY AT BEDTIME AS NEEDED 30 tablet 5  . Diclofenac Sodium 2 % SOLN Place 2 g onto the skin 2 (two) times daily. 112 g 3  . fluticasone (FLONASE) 50 MCG/ACT nasal spray USE 2 SPRAYS IN EACH NOSTRIL DAILY AS NEEDED FOR ALLERGIES 48 mL 1  . gabapentin (NEURONTIN) 300 MG capsule TAKE 1 CAPSULE BY MOUTH EVERYDAY AT BEDTIME 90 capsule 4  . levothyroxine (SYNTHROID) 125 MCG tablet TAKE 1 TABLET BY MOUTH EVERY DAY 90 tablet 1  . losartan-hydrochlorothiazide (HYZAAR) 100-12.5 MG tablet Take 1 tablet by mouth daily. 90 tablet 3  . Multiple Vitamins-Minerals (MULTIVITAMINS THER. W/MINERALS) TABS Take 1 tablet by mouth daily.      . rosuvastatin (CRESTOR) 20 MG tablet TAKE 1 TABLET BY MOUTH EVERY DAY 90 tablet 1  .  sertraline (ZOLOFT) 100 MG tablet Take 1 tablet (100 mg total) by mouth daily. 90 tablet 3  . SUMAtriptan (IMITREX) 100 MG tablet TAKE 1 TABLET BY AS NEEDED FOR MIGRAINE.MAY REPEAT IN 24 HOURS AS DIRECTED 9 tablet 1  . traMADol (ULTRAM) 50 MG tablet Take 1 tablet (50 mg total) by mouth every 8 (eight) hours as needed for severe pain. 15 tablet 0   No facility-administered medications prior to visit.    Allergies  Allergen Reactions  . Morphine Sulfate Nausea  And Vomiting and Rash    GI upset    ROS Review of Systems  Constitutional: Negative for fatigue.  Eyes: Negative for visual disturbance.  Respiratory: Negative for cough, chest tightness, shortness of breath and wheezing.   Cardiovascular: Negative for chest pain, palpitations and leg swelling.  Neurological: Negative for dizziness, seizures, syncope, weakness, light-headedness and headaches.      Objective:    Physical Exam  Constitutional: She appears well-developed and well-nourished.  HENT:  Head: Normocephalic and atraumatic.  Eyes: Pupils are equal, round, and reactive to light.  Neck: No JVD present. No thyromegaly present.  Cardiovascular: Normal rate and regular rhythm. Exam reveals no gallop.  Pulmonary/Chest: Effort normal and breath sounds normal. No respiratory distress. She has no wheezes. She has no rales.  Musculoskeletal:        General: No edema.     Cervical back: Neck supple.  Neurological: She is alert.    BP 134/76 (BP Location: Left Arm, Patient Position: Sitting, Cuff Size: Normal)   Pulse 61   Temp 97.6 F (36.4 C) (Temporal)   Wt 218 lb 6.4 oz (99.1 kg)   SpO2 96%   BMI 39.95 kg/m  Wt Readings from Last 3 Encounters:  03/14/20 218 lb 6.4 oz (99.1 kg)  02/16/20 214 lb (97.1 kg)  01/19/20 216 lb (98 kg)     Health Maintenance Due  Topic Date Due  . HIV Screening  Never done  . PNA vac Low Risk Adult (2 of 2 - PPSV23) 02/10/2020    There are no preventive care reminders to  display for this patient.  Lab Results  Component Value Date   TSH 1.71 04/19/2019   Lab Results  Component Value Date   WBC 5.7 03/14/2020   HGB 13.3 03/14/2020   HCT 39.5 03/14/2020   MCV 87.6 03/14/2020   PLT 204.0 03/14/2020   Lab Results  Component Value Date   NA 139 03/14/2020   K 3.7 03/14/2020   CO2 28 03/14/2020   GLUCOSE 98 03/14/2020   BUN 14 03/14/2020   CREATININE 0.72 03/14/2020   BILITOT <0.2 (L) 09/30/2019   ALKPHOS 88 09/30/2019   AST 22 09/30/2019   ALT 21 09/30/2019   PROT 6.3 (L) 09/30/2019   ALBUMIN 3.6 09/30/2019   CALCIUM 9.4 03/14/2020   ANIONGAP 9 09/30/2019   GFR 81.27 03/14/2020   Lab Results  Component Value Date   CHOL 155 01/18/2020   Lab Results  Component Value Date   HDL 58.70 01/18/2020   Lab Results  Component Value Date   LDLCALC 84 01/18/2020   Lab Results  Component Value Date   TRIG 62.0 01/18/2020   Lab Results  Component Value Date   CHOLHDL 3 01/18/2020   Lab Results  Component Value Date   HGBA1C 6.1 01/18/2020      Assessment & Plan:   Problem List Items Addressed This Visit    None    Visit Diagnoses    Preoperative clearance    -  Primary   Relevant Orders   Protime-INR (Completed)   Basic Metabolic Panel (Completed)   CBC with Differential/Platelets (Completed)    no medical contraindications for surgery.  Labs above obtained and pending at this time.  No orders of the defined types were placed in this encounter.   Follow-up: No follow-ups on file.    Carolann Littler, MD

## 2020-03-14 NOTE — Telephone Encounter (Signed)
[facility] stated they are needing the labs ordered today to be future labs in order for them to be able to test pt's blood. They went ahead and drew the pt's blood but needs this fixed in order to proceed.   [phone]

## 2020-03-16 ENCOUNTER — Other Ambulatory Visit: Payer: Self-pay | Admitting: Oncology

## 2020-03-29 ENCOUNTER — Telehealth: Payer: Self-pay | Admitting: Oncology

## 2020-03-29 NOTE — Telephone Encounter (Signed)
Called pt per 6/23 sch msg - no answer - left message for patient to call back .

## 2020-03-30 ENCOUNTER — Inpatient Hospital Stay: Payer: Medicare Other

## 2020-03-30 ENCOUNTER — Inpatient Hospital Stay: Payer: Medicare Other | Admitting: Oncology

## 2020-04-06 ENCOUNTER — Telehealth: Payer: Self-pay | Admitting: Family Medicine

## 2020-04-06 NOTE — Telephone Encounter (Signed)
Medication Refill:  ADDERALL   Pharmacy:  [facility] [place], [place] Phone:  [phone]  Fax:  [phone]

## 2020-04-07 NOTE — Telephone Encounter (Signed)
Called pt and told her she needs to call pharmacy she should still have one more refill

## 2020-04-19 ENCOUNTER — Encounter: Payer: Self-pay | Admitting: Family Medicine

## 2020-04-19 ENCOUNTER — Other Ambulatory Visit: Payer: Self-pay

## 2020-04-19 ENCOUNTER — Ambulatory Visit (INDEPENDENT_AMBULATORY_CARE_PROVIDER_SITE_OTHER): Payer: Medicare Other | Admitting: Family Medicine

## 2020-04-19 VITALS — BP 130/80 | HR 86 | Temp 98.2°F | Ht 62.0 in | Wt 215.2 lb

## 2020-04-19 DIAGNOSIS — E039 Hypothyroidism, unspecified: Secondary | ICD-10-CM

## 2020-04-19 DIAGNOSIS — I10 Essential (primary) hypertension: Secondary | ICD-10-CM | POA: Diagnosis not present

## 2020-04-19 DIAGNOSIS — Z23 Encounter for immunization: Secondary | ICD-10-CM

## 2020-04-19 DIAGNOSIS — R739 Hyperglycemia, unspecified: Secondary | ICD-10-CM

## 2020-04-19 DIAGNOSIS — E785 Hyperlipidemia, unspecified: Secondary | ICD-10-CM

## 2020-04-19 DIAGNOSIS — Z0000 Encounter for general adult medical examination without abnormal findings: Secondary | ICD-10-CM | POA: Diagnosis not present

## 2020-04-19 NOTE — Patient Instructions (Signed)
Preventive Care 38 Years and Older, Female Preventive care refers to lifestyle choices and visits with your health care provider that can promote health and wellness. This includes:  A yearly physical exam. This is also called an annual well check.  Regular dental and eye exams.  Immunizations.  Screening for certain conditions.  Healthy lifestyle choices, such as diet and exercise. What can I expect for my preventive care visit? Physical exam Your health care provider will check:  Height and weight. These may be used to calculate body mass index (BMI), which is a measurement that tells if you are at a healthy weight.  Heart rate and blood pressure.  Your skin for abnormal spots. Counseling Your health care provider may ask you questions about:  Alcohol, tobacco, and drug use.  Emotional well-being.  Home and relationship well-being.  Sexual activity.  Eating habits.  History of falls.  Memory and ability to understand (cognition).  Work and work Statistician.  Pregnancy and menstrual history. What immunizations do I need?  Influenza (flu) vaccine  This is recommended every year. Tetanus, diphtheria, and pertussis (Tdap) vaccine  You may need a Td booster every 10 years. Varicella (chickenpox) vaccine  You may need this vaccine if you have not already been vaccinated. Zoster (shingles) vaccine  You may need this after age 33. Pneumococcal conjugate (PCV13) vaccine  One dose is recommended after age 33. Pneumococcal polysaccharide (PPSV23) vaccine  One dose is recommended after age 72. Measles, mumps, and rubella (MMR) vaccine  You may need at least one dose of MMR if you were born in 1957 or later. You may also need a second dose. Meningococcal conjugate (MenACWY) vaccine  You may need this if you have certain conditions. Hepatitis A vaccine  You may need this if you have certain conditions or if you travel or work in places where you may be exposed  to hepatitis A. Hepatitis B vaccine  You may need this if you have certain conditions or if you travel or work in places where you may be exposed to hepatitis B. Haemophilus influenzae type b (Hib) vaccine  You may need this if you have certain conditions. You may receive vaccines as individual doses or as more than one vaccine together in one shot (combination vaccines). Talk with your health care provider about the risks and benefits of combination vaccines. What tests do I need? Blood tests  Lipid and cholesterol levels. These may be checked every 5 years, or more frequently depending on your overall health.  Hepatitis C test.  Hepatitis B test. Screening  Lung cancer screening. You may have this screening every year starting at age 39 if you have a 30-pack-year history of smoking and currently smoke or have quit within the past 15 years.  Colorectal cancer screening. All adults should have this screening starting at age 36 and continuing until age 15. Your health care provider may recommend screening at age 23 if you are at increased risk. You will have tests every 1-10 years, depending on your results and the type of screening test.  Diabetes screening. This is done by checking your blood sugar (glucose) after you have not eaten for a while (fasting). You may have this done every 1-3 years.  Mammogram. This may be done every 1-2 years. Talk with your health care provider about how often you should have regular mammograms.  BRCA-related cancer screening. This may be done if you have a family history of breast, ovarian, tubal, or peritoneal cancers.  Other tests  Sexually transmitted disease (STD) testing.  Bone density scan. This is done to screen for osteoporosis. You may have this done starting at age 44. Follow these instructions at home: Eating and drinking  Eat a diet that includes fresh fruits and vegetables, whole grains, lean protein, and low-fat dairy products. Limit  your intake of foods with high amounts of sugar, saturated fats, and salt.  Take vitamin and mineral supplements as recommended by your health care provider.  Do not drink alcohol if your health care provider tells you not to drink.  If you drink alcohol: ? Limit how much you have to 0-1 drink a day. ? Be aware of how much alcohol is in your drink. In the [place] one drink equals one 12 oz bottle of beer (355 mL), one 5 oz glass of wine (148 mL), or one 1 oz glass of hard liquor (44 mL). Lifestyle  Take daily care of your teeth and gums.  Stay active. Exercise for at least 30 minutes on 5 or more days each week.  Do not use any products that contain nicotine or tobacco, such as cigarettes, e-cigarettes, and chewing tobacco. If you need help quitting, ask your health care provider.  If you are sexually active, practice safe sex. Use a condom or other form of protection in order to prevent STIs (sexually transmitted infections).  Talk with your health care provider about taking a low-dose aspirin or statin. What's next?  Go to your health care provider once a year for a well check visit.  Ask your health care provider how often you should have your eyes and teeth checked.  Stay up to date on all vaccines. This information is not intended to replace advice given to you by your health care provider. Make sure you discuss any questions you have with your health care provider. Document Revised: 09/17/2018 Document Reviewed: 09/17/2018 Elsevier Patient Education  2020 [employer].

## 2020-04-19 NOTE — Progress Notes (Signed)
Established Patient Office Visit  Subjective:  Patient ID: Tiffany Montes, female    DOB: [birth year]  Age: 64 y.o. MRN: [id number]  CC:  Chief Complaint  Patient presents with   Annual Exam    Pt has no new concerns     HPI Tiffany Montes presents for Welcome to Medicare exam and medical follow-up.Marland Kitchen   Her chronic problems include history of hypertension, obesity, hypothyroidism, degenerative arthritis involving multiple joints, attention deficit disorder, hyperlipidemia, history of breast cancer, history of prediabetes.  She is still followed closely by oncology.  She had recent right total knee replacement that went well.  She has recovered fairly well and still in rehab.  Current medications reviewed.  Compliant with all.  She had multiple labs done prior to her surgery in early June and this included A1c, lipid panel, CBC, basic metabolic panel, INR and these were all stable.  She is due for follow-up TSH.  No history of Pneumovax.  She had Zostavax but no history of Shingrix.  She states she had vision screening within the past year which was unremarkable.  She had previous cataract surgery.  She has had Covid vaccine.  Colonoscopy is up-to-date.  Tetanus was updated last summer.  She is getting yearly mammograms.  Previous hepatitis C screen negative.  She sees GYN for Pap smears.  Recent EKG 11/01/19 reviewed..    1.  Risk factors based on Past Medical , Social, and Family history reviewed and as indicated above with no changes  2.  Limitations in physical activities None.  No recent falls.  [place] once last year.  Slipped on tile.  No falls since.  Recent right TKR and recovering well.  Limited activity since her fall but improving.  3.  Depression/mood No active depression or anxiety issues   She does have chronic depression which is treated currently and stable.  PHQ 2=0  4.  Hearing No deficits  5.  ADLs independent in all.  6.  Cognitive function (orientation to  time and place, language, writing, speech,memory) no short or long term memory issues.  Language and judgement intact.   Oriented to time, place, date.  Normal serial 7 subtractions.  2/3 three word recall.     7.  Home Safety no issues.  Discussed fall prevention in home.  Limit rugs.  Ensure good lighting at night in bedroom and bathroom.      8.  Height, weight, and visual acuity.all stable. Sees optometrist yearly for eye exams. Wt Readings from Last 3 Encounters:  04/19/20 215 lb 3.2 oz (97.6 kg)  03/14/20 218 lb 6.4 oz (99.1 kg)  02/16/20 214 lb (97.1 kg)     9.  Counseling discussed weight loss. Counseled regarding age and gender appropriate preventative screenings and immunizations.   10. Recommendation of preventive services. Pneumovax given..  Consider shingrix.  11. Labs based on risk factors-TSH  12. Care Plan-as below  13. Other Providers-Oncology Therapist, nutritional) Orthopedics Mayer Camel). GYN Milta Deiters).    14. Written schedule of screening/prevention services given to patient.  63 Advanced Directives-she states she has medical power of attorney which is her daughter and also has living will in place.   Past Medical History:  Diagnosis Date   Anxiety    Attention deficit disorder    Breast cancer ([place]) 2019   Left Breast Cancer   Depression    Gallstones 09/05/2011   Hyperlipidemia    Hypertension    Hypothyroidism    Migraines  Personal history of chemotherapy 2019   Left Breast Cancer   Personal history of radiation therapy 2019   Left Breast Cancer   PONV (postoperative nausea and vomiting)    diffficulty waking up   Thyroid disease    hypothyroid    Past Surgical History:  Procedure Laterality Date   ABDOMINAL HYSTERECTOMY  2003   BREAST BIOPSY Left 2014   benign   BREAST LUMPECTOMY Left 09/07/2018   BREAST LUMPECTOMY WITH RADIOACTIVE SEED AND SENTINEL LYMPH NODE BIOPSY Left 09/07/2018   Procedure: LEFT BREAST LUMPECTOMY WITH RADIOACTIVE  SEED AND AXILLARY SENTINEL LYMPH NODE BIOPSY,INJECT BLUE DYE LEFT BREAST;  Surgeon: Fanny Skates, MD;  Location: [facility];  Service: General;  Laterality: Left;   CHOLECYSTECTOMY  09/26/2011   Procedure: LAPAROSCOPIC CHOLECYSTECTOMY WITH INTRAOPERATIVE CHOLANGIOGRAM;  Surgeon: Haywood Lasso, MD;  Location: [facility];  Service: General;  Laterality: N/A;   COLONOSCOPY     CYSTIC HYGROMA EXCISION     DENTAL SURGERY     skin graft from top of mouth to lower gums   PORT-A-CATH REMOVAL Right 11/04/2019   Procedure: REMOVAL PORT-A-CATH;  Surgeon: Donnie Mesa, MD;  Location: [facility];  Service: General;  Laterality: Right;   PORTACATH PLACEMENT N/A 09/07/2018   Procedure: [place] WITH [place];  Surgeon: Fanny Skates, MD;  Location: [facility];  Service: General;  Laterality: N/A;    Family History  Problem Relation Age of Onset   Diabetes Father    Heart disease Father    Lung cancer Father    Breast cancer Neg Hx    Ovarian cancer Neg Hx     Social History   Socioeconomic History   Marital status: Divorced    Spouse name: Not on file   Number of children: Not on file   Years of education: Not on file   Highest education level: Not on file  Occupational History   Not on file  Tobacco Use   Smoking status: Never Smoker   Smokeless tobacco: Never Used  Vaping Use   Vaping Use: Never used  Substance and Sexual Activity   Alcohol use: Yes    Comment: 1-2 a week and reports not every week   Drug use: No   Sexual activity: Not Currently    Birth control/protection: Surgical  Other Topics Concern   Not on file  Social History Narrative   Not on file   Social Determinants of Health   Financial Resource Strain:    Difficulty of Paying Living Expenses:   Food Insecurity:    Worried About Charity fundraiser in the Last Year:    Arboriculturist in the Last Year:   Transportation Needs:    Film/video editor (Medical):      Lack of Transportation (Non-Medical):   Physical Activity:    Days of Exercise per Week:    Minutes of Exercise per Session:   Stress:    Feeling of Stress :   Social Connections:    Frequency of Communication with Friends and Family:    Frequency of Social Gatherings with Friends and Family:    Attends Religious Services:    Active Member of Clubs or Organizations:    Attends Archivist Meetings:    Marital Status:   Intimate Partner Violence:    Fear of Current or Ex-Partner:    Emotionally Abused:    Physically Abused:    Sexually Abused:     Outpatient Medications Prior  to Visit  Medication Sig Dispense Refill   amphetamine-dextroamphetamine (ADDERALL XR) 30 MG 24 hr capsule Take one capsule by mouth every morning.  May refill in one month. 30 capsule 0   amphetamine-dextroamphetamine (ADDERALL XR) 30 MG 24 hr capsule Take one capsule by mouth every morning.  May refill in two months. 30 capsule 0   amphetamine-dextroamphetamine (ADDERALL XR) 30 MG 24 hr capsule Take 1 capsule (30 mg total) by mouth every morning. 30 capsule 0   anastrozole (ARIMIDEX) 1 MG tablet TAKE 1 TABLET BY MOUTH EVERY DAY 90 tablet 4   augmented betamethasone dipropionate (DIPROLENE-AF) 0.05 % cream Apply 1 application topically 2 (two) times daily as needed (for eczema). 30 g 2   benzonatate (TESSALON) 100 MG capsule Take 1 capsule (100 mg total) by mouth 2 (two) times daily as needed for cough (1 to 2 every hours as needed). 40 capsule 0   BIOTIN PO Take 1 tablet by mouth daily.     buPROPion (WELLBUTRIN XL) 300 MG 24 hr tablet TAKE 1 TABLET BY MOUTH EVERY DAY 90 tablet 1   Cholecalciferol (VITAMIN D3 PO) Take 1 tablet by mouth daily.     clonazePAM (KLONOPIN) 0.5 MG tablet TAKE 1 TABLET BY MOUTH EVERY DAY AT BEDTIME AS NEEDED 30 tablet 5   Diclofenac Sodium 2 % SOLN Place 2 g onto the skin 2 (two) times daily. 112 g 3   fluticasone (FLONASE) 50 MCG/ACT nasal spray  USE 2 SPRAYS IN EACH NOSTRIL DAILY AS NEEDED FOR ALLERGIES 48 mL 1   gabapentin (NEURONTIN) 300 MG capsule TAKE 1 CAPSULE BY MOUTH EVERYDAY AT BEDTIME 90 capsule 4   levothyroxine (SYNTHROID) 125 MCG tablet TAKE 1 TABLET BY MOUTH EVERY DAY 90 tablet 1   losartan-hydrochlorothiazide (HYZAAR) 100-12.5 MG tablet Take 1 tablet by mouth daily. 90 tablet 3   Multiple Vitamins-Minerals (MULTIVITAMINS THER. W/MINERALS) TABS Take 1 tablet by mouth daily.       rosuvastatin (CRESTOR) 20 MG tablet TAKE 1 TABLET BY MOUTH EVERY DAY 90 tablet 1   sertraline (ZOLOFT) 100 MG tablet Take 1 tablet (100 mg total) by mouth daily. 90 tablet 3   SUMAtriptan (IMITREX) 100 MG tablet TAKE 1 TABLET BY AS NEEDED FOR MIGRAINE.MAY REPEAT IN 24 HOURS AS DIRECTED 9 tablet 1   traMADol (ULTRAM) 50 MG tablet Take 1 tablet (50 mg total) by mouth every 8 (eight) hours as needed for severe pain. 15 tablet 0   No facility-administered medications prior to visit.    Allergies  Allergen Reactions   Morphine Sulfate Nausea And Vomiting and Rash    GI upset    ROS Review of Systems  Constitutional: Negative for appetite change, chills and fever.  HENT: Negative for trouble swallowing.   Eyes: Negative for visual disturbance.  Respiratory: Negative for cough and shortness of breath.   Cardiovascular: Negative for chest pain.  Gastrointestinal: Negative for abdominal pain.  Genitourinary: Negative for dysuria and hematuria.  Musculoskeletal: Positive for arthralgias.  Neurological: Negative for syncope and headaches.  Hematological: Negative for adenopathy. Does not bruise/bleed easily.  Psychiatric/Behavioral: Negative for confusion and dysphoric mood.      Objective:    Physical Exam Vitals reviewed.  Constitutional:      Appearance: Normal appearance.  Cardiovascular:     Rate and Rhythm: Normal rate and regular rhythm.  Pulmonary:     Effort: Pulmonary effort is normal.     Breath sounds: Normal  breath sounds.  Abdominal:  Palpations: Abdomen is soft. There is no mass.     Tenderness: There is no abdominal tenderness.  Musculoskeletal:     Cervical back: Neck supple.     Comments: Right knee- scar from recent TKR.   Appears to be healing well.  No erythema.    Skin:    Findings: No rash.  Neurological:     General: No focal deficit present.     Mental Status: She is alert.  Psychiatric:        Mood and Affect: Mood normal.        Thought Content: Thought content normal.     BP 130/80 (BP Location: Left Arm, Patient Position: Sitting, Cuff Size: Normal)    Pulse 86    Temp 98.2 F (36.8 C) (Temporal)    Ht 5\' 2"  (1.575 m)    Wt 215 lb 3.2 oz (97.6 kg)    SpO2 95%    BMI 39.36 kg/m  Wt Readings from Last 3 Encounters:  04/19/20 215 lb 3.2 oz (97.6 kg)  03/14/20 218 lb 6.4 oz (99.1 kg)  02/16/20 214 lb (97.1 kg)     Health Maintenance Due  Topic Date Due   HIV Screening  Never done   PNA vac Low Risk Adult (2 of 2 - PPSV23) 02/10/2020    There are no preventive care reminders to display for this patient.  Lab Results  Component Value Date   TSH 1.71 04/19/2019   Lab Results  Component Value Date   WBC 5.7 03/14/2020   HGB 13.3 03/14/2020   HCT 39.5 03/14/2020   MCV 87.6 03/14/2020   PLT 204.0 03/14/2020   Lab Results  Component Value Date   NA 139 03/14/2020   K 3.7 03/14/2020   CO2 28 03/14/2020   GLUCOSE 98 03/14/2020   BUN 14 03/14/2020   CREATININE 0.72 03/14/2020   BILITOT <0.2 (L) 09/30/2019   ALKPHOS 88 09/30/2019   AST 22 09/30/2019   ALT 21 09/30/2019   PROT 6.3 (L) 09/30/2019   ALBUMIN 3.6 09/30/2019   CALCIUM 9.4 03/14/2020   ANIONGAP 9 09/30/2019   GFR 81.27 03/14/2020   Lab Results  Component Value Date   CHOL 155 01/18/2020   Lab Results  Component Value Date   HDL 58.70 01/18/2020   Lab Results  Component Value Date   LDLCALC 84 01/18/2020   Lab Results  Component Value Date   TRIG 62.0 01/18/2020   Lab  Results  Component Value Date   CHOLHDL 3 01/18/2020   Lab Results  Component Value Date   HGBA1C 6.1 01/18/2020      Assessment & Plan:    #1 Welcome to Medicare initial visit -Recommend Pneumovax -Discussed Shingrix vaccine and she will check on insurance coverage and consider getting at pharmacist -Continue annual flu vaccine -Continue annual mammograms -No EKG done since was done recently pre-op.    #2 hypertension stable.  Continue current medications  #3 hypothyroidism.  Needs follow-up TSH  #4 history of prediabetes/hyperglycemia.  We strongly encouraged her to lose some weight this year and hopefully after her knee replacement she can step up her activity and continue losing weight.  Recent A1c 6.1%.  Consider follow-up A1c at some point this year  No orders of the defined types were placed in this encounter.   Follow-up: No follow-ups on file.    Carolann Littler, MD

## 2020-04-20 LAB — TSH: TSH: 5.45 mIU/L — ABNORMAL HIGH (ref 0.40–4.50)

## 2020-04-21 ENCOUNTER — Other Ambulatory Visit: Payer: Self-pay

## 2020-04-21 DIAGNOSIS — E039 Hypothyroidism, unspecified: Secondary | ICD-10-CM

## 2020-04-21 MED ORDER — LEVOTHYROXINE SODIUM 137 MCG PO TABS: 137.0000 ug | ORAL_TABLET | Freq: Every day | ORAL | 1 refills | Status: DC

## 2020-04-22 ENCOUNTER — Other Ambulatory Visit: Payer: Self-pay | Admitting: Family Medicine

## 2020-04-26 ENCOUNTER — Other Ambulatory Visit: Payer: Self-pay

## 2020-04-26 ENCOUNTER — Telehealth: Payer: Self-pay | Admitting: Oncology

## 2020-04-26 ENCOUNTER — Inpatient Hospital Stay: Payer: Medicare Other | Attending: Adult Health

## 2020-04-26 ENCOUNTER — Inpatient Hospital Stay (HOSPITAL_BASED_OUTPATIENT_CLINIC_OR_DEPARTMENT_OTHER): Payer: Medicare Other | Admitting: Oncology

## 2020-04-26 VITALS — BP 143/76 | HR 88 | Temp 98.3°F | Resp 18 | Ht 62.0 in | Wt 213.0 lb

## 2020-04-26 DIAGNOSIS — Z79899 Other long term (current) drug therapy: Secondary | ICD-10-CM | POA: Insufficient documentation

## 2020-04-26 DIAGNOSIS — C50312 Malignant neoplasm of lower-inner quadrant of left female breast: Secondary | ICD-10-CM | POA: Insufficient documentation

## 2020-04-26 DIAGNOSIS — R232 Flushing: Secondary | ICD-10-CM | POA: Insufficient documentation

## 2020-04-26 DIAGNOSIS — Z803 Family history of malignant neoplasm of breast: Secondary | ICD-10-CM | POA: Diagnosis not present

## 2020-04-26 DIAGNOSIS — Z6841 Body Mass Index (BMI) 40.0 and over, adult: Secondary | ICD-10-CM | POA: Diagnosis not present

## 2020-04-26 DIAGNOSIS — Z801 Family history of malignant neoplasm of trachea, bronchus and lung: Secondary | ICD-10-CM | POA: Diagnosis not present

## 2020-04-26 DIAGNOSIS — F418 Other specified anxiety disorders: Secondary | ICD-10-CM | POA: Insufficient documentation

## 2020-04-26 DIAGNOSIS — Z79811 Long term (current) use of aromatase inhibitors: Secondary | ICD-10-CM | POA: Insufficient documentation

## 2020-04-26 DIAGNOSIS — C50212 Malignant neoplasm of upper-inner quadrant of left female breast: Secondary | ICD-10-CM

## 2020-04-26 DIAGNOSIS — E039 Hypothyroidism, unspecified: Secondary | ICD-10-CM | POA: Insufficient documentation

## 2020-04-26 DIAGNOSIS — Z170 Estrogen receptor positive status [ER+]: Secondary | ICD-10-CM

## 2020-04-26 DIAGNOSIS — I10 Essential (primary) hypertension: Secondary | ICD-10-CM | POA: Insufficient documentation

## 2020-04-26 DIAGNOSIS — R509 Fever, unspecified: Secondary | ICD-10-CM | POA: Diagnosis not present

## 2020-04-26 DIAGNOSIS — E785 Hyperlipidemia, unspecified: Secondary | ICD-10-CM | POA: Insufficient documentation

## 2020-04-26 LAB — COMPREHENSIVE METABOLIC PANEL
ALT: 17 U/L (ref 0–44)
AST: 18 U/L (ref 15–41)
Albumin: 4.1 g/dL (ref 3.5–5.0)
Alkaline Phosphatase: 92 U/L (ref 38–126)
Anion gap: 10 (ref 5–15)
BUN: 15 mg/dL (ref 8–23)
CO2: 25 mmol/L (ref 22–32)
Calcium: 10 mg/dL (ref 8.9–10.3)
Chloride: 107 mmol/L (ref 98–111)
Creatinine, Ser: 0.82 mg/dL (ref 0.44–1.00)
GFR calc Af Amer: >60 mL/min (ref >60)
GFR calc non Af Amer: >60 mL/min (ref >60)
Glucose, Bld: 109 mg/dL — ABNORMAL HIGH (ref 70–99)
Potassium: 4 mmol/L (ref 3.5–5.1)
Sodium: 142 mmol/L (ref 135–145)
Total Bilirubin: 0.3 mg/dL (ref 0.3–1.2)
Total Protein: 7.2 g/dL (ref 6.5–8.1)

## 2020-04-26 LAB — CBC WITH DIFFERENTIAL/PLATELET
Abs Immature Granulocytes: 0.01 10*3/uL (ref 0.00–0.07)
Basophils Absolute: 0.1 10*3/uL (ref 0.0–0.1)
Basophils Relative: 1 %
Eosinophils Absolute: 0.4 10*3/uL (ref 0.0–0.5)
Eosinophils Relative: 8 %
HCT: 41.4 % (ref 36.0–46.0)
Hemoglobin: 13.2 g/dL (ref 12.0–15.0)
Immature Granulocytes: 0 %
Lymphocytes Relative: 24 %
Lymphs Abs: 1.3 10*3/uL (ref 0.7–4.0)
MCH: 28.8 pg (ref 26.0–34.0)
MCHC: 31.9 g/dL (ref 30.0–36.0)
MCV: 90.4 fL (ref 80.0–100.0)
Monocytes Absolute: 0.4 10*3/uL (ref 0.1–1.0)
Monocytes Relative: 8 %
Neutro Abs: 3 10*3/uL (ref 1.7–7.7)
Neutrophils Relative %: 59 %
Platelets: 231 10*3/uL (ref 150–400)
RBC: 4.58 MIL/uL (ref 3.87–5.11)
RDW: 13 % (ref 11.5–15.5)
WBC: 5.1 10*3/uL (ref 4.0–10.5)
nRBC: 0 % (ref 0.0–0.2)

## 2020-04-26 NOTE — Telephone Encounter (Signed)
Scheduled appts per 7/21 los. Gave pt a print out of AVS.

## 2020-04-26 NOTE — Progress Notes (Signed)
[facility]  [phone] Fax:([phone]    ID: Tiffany Montes DOB: 04-24-1956  MR#: [id number]  YSA#:[id number]  Patient Care Team: Eulas Post, MD as PCP - General Draken Farrior, Virgie Dad, MD as Consulting Physician (Oncology) Kyung Rudd, MD as Consulting Physician (Radiation Oncology) Maisie Fus, MD as Consulting Physician (Obstetrics and Gynecology) Larey Dresser, MD as Consulting Physician (Cardiology) OTHER MD:    CHIEF COMPLAINT: Estrogen receptor positive breast cancer, HER-2 amplified  CURRENT TREATMENT: Anastrozole   INTERVAL HISTORY: Tiffany Montes returns today for follow-up of her estrogen receptor positive and HER2 amplified breast cancer.    She continues on anastrozole.  She tolerates this well, with minimal hot flashes and mild vaginal dryness issues.  Since her last visit, she had her port removed on 11/04/2019.  She also underwent bone density screening on 11/05/2019 showing a T-score of -0.3, which is considered normal.  She underwent right knee surgery under Dr. Paulo Fruit and is recovering very nicely from that.   REVIEW OF SYSTEMS: Tiffany Montes is exercising as best she can, chiefly doing physical therapy for her knee right now.  She has no unusual headaches visual changes cough phlegm production pleurisy or shortness of breath or change in bowel or bladder habit.  Her hair is coming quite strong and a bit currently.  Detailed review of systems today was otherwise stable   HISTORY OF CURRENT ILLNESS: From the original intake note:  "Tiffany Montes" had routine screening mammography on 07/22/2018 showing a possible abnormality in the left breast. She underwent unilateral diagnostic mammography with tomography and left breast ultrasonography at [facility] on 07/28/2018 showing: Breast density Category C; a 7 x 8 x 8 mm hypoechoic mass in the left breast lower inner quadrant, consistent with a complex cyst.. No abnormal lymph nodes in the  left axilla.  Accordingly on 07/31/2018 she proceeded to biopsy of the left breast area in question. The pathology from this procedure showed ([id number]): Invasive Ductal Carcinoma, Grade 3. Prognostic indicators significant for: estrogen receptor, 95% positive and progesterone receptor, 85% positive, both with strong staining intensity. Proliferation marker Ki67 at 75%. HER2 positive by immunohistochemistry, 3+.   The patient's subsequent history is as detailed below.   PAST MEDICAL HISTORY: Past Medical History:  Diagnosis Date  . Anxiety   . Attention deficit disorder   . Breast cancer ([place]) 2019   Left Breast Cancer  . Depression   . Gallstones 09/05/2011  . Hyperlipidemia   . Hypertension   . Hypothyroidism   . Migraines   . Personal history of chemotherapy 2019   Left Breast Cancer  . Personal history of radiation therapy 2019   Left Breast Cancer  . PONV (postoperative nausea and vomiting)    diffficulty waking up  . Thyroid disease    hypothyroid    PAST SURGICAL HISTORY: Past Surgical History:  Procedure Laterality Date  . ABDOMINAL HYSTERECTOMY  2003  . BREAST BIOPSY Left 2014   benign  . BREAST LUMPECTOMY Left 09/07/2018  . BREAST LUMPECTOMY WITH RADIOACTIVE SEED AND SENTINEL LYMPH NODE BIOPSY Left 09/07/2018   Procedure: LEFT BREAST LUMPECTOMY WITH RADIOACTIVE SEED AND AXILLARY SENTINEL LYMPH NODE BIOPSY,INJECT BLUE DYE LEFT BREAST;  Surgeon: Fanny Skates, MD;  Location: [facility];  Service: General;  Laterality: Left;  . CHOLECYSTECTOMY  09/26/2011   Procedure: LAPAROSCOPIC CHOLECYSTECTOMY WITH INTRAOPERATIVE CHOLANGIOGRAM;  Surgeon: Haywood Lasso, MD;  Location: [facility];  Service: General;  Laterality: N/A;  . COLONOSCOPY    .  CYSTIC HYGROMA EXCISION    . DENTAL SURGERY     skin graft from top of mouth to lower gums  . PORT-A-CATH REMOVAL Right 11/04/2019   Procedure: REMOVAL PORT-A-CATH;  Surgeon: Donnie Mesa, MD;  Location: [facility];   Service: General;  Laterality: Right;  . PORTACATH PLACEMENT N/A 09/07/2018   Procedure: INSERTION PORT-A-CATH WITH [place];  Surgeon: Fanny Skates, MD;  Location: [facility];  Service: General;  Laterality: N/A;    FAMILY HISTORY: Family History  Problem Relation Age of Onset  . Diabetes Father   . Heart disease Father   . Lung cancer Father   . Breast cancer Neg Hx   . Ovarian cancer Neg Hx    She notes that her father died from CHF at age 71.  He was diagnosed with lung cancer at age 59. Patients' mother is 51 years old as of November 2019. The patient has 1 brother and 2 sisters. Patient denies a family history of ovarian or breast cancer.   GYNECOLOGIC HISTORY:  No LMP recorded. Patient has had a hysterectomy. 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 Job is very stressful and schedule is  She is divorced and lives by herself with her 2 dogs. Her daughter [name] is a Dealer in [place], [name].  The patient's son, [name] is a Training and development officer and lives in [place]. The patient has 2 grandchildren and 1 great-grand child.  She is not a church attender   ADVANCED DIRECTIVES: Her Atkins is her daughter, [name], [phone].     HEALTH MAINTENANCE: Social History   Tobacco Use  . Smoking status: Never Smoker  . Smokeless tobacco: Never Used  Vaping Use  . Vaping Use: Never used  Substance Use Topics  . Alcohol use: Yes    Comment: 1-2 a week and reports not every week  . Drug use: No    Colonoscopy: 05/2019, Dr. Earlean Shawl, 1 adenoma  PAP: 1 month ago  Bone density: Yes, 2 years ago   Allergies  Allergen Reactions  . Morphine Sulfate Nausea And Vomiting and Rash    GI upset    Current  Outpatient Medications  Medication Sig Dispense Refill  . amphetamine-dextroamphetamine (ADDERALL XR) 30 MG 24 hr capsule Take one capsule by mouth every morning.  May refill in one month. 30 capsule 0  . amphetamine-dextroamphetamine (ADDERALL XR) 30 MG 24 hr capsule Take one capsule by mouth every morning.  May refill in two months. 30 capsule 0  . amphetamine-dextroamphetamine (ADDERALL XR) 30 MG 24 hr capsule Take 1 capsule (30 mg total) by mouth every morning. 30 capsule 0  . anastrozole (ARIMIDEX) 1 MG tablet TAKE 1 TABLET BY MOUTH EVERY DAY 90 tablet 4  . augmented betamethasone dipropionate (DIPROLENE-AF) 0.05 % cream Apply 1 application topically 2 (two) times daily as needed (for eczema). 30 g 2  . benzonatate (TESSALON) 100 MG capsule Take 1 capsule (100 mg total) by mouth 2 (two) times daily as needed for cough (1 to 2 every hours as needed). 40 capsule 0  . BIOTIN PO Take 1 tablet by mouth daily.    Marland Kitchen buPROPion (WELLBUTRIN XL) 300 MG 24 hr tablet TAKE 1 TABLET BY MOUTH EVERY  DAY 90 tablet 1  . Cholecalciferol (VITAMIN D3 PO) Take 1 tablet by mouth daily.    . clonazePAM (KLONOPIN) 0.5 MG tablet TAKE 1 TABLET BY MOUTH EVERY DAY AT BEDTIME AS NEEDED 30 tablet 5  . Diclofenac Sodium 2 % SOLN Place 2 g onto the skin 2 (two) times daily. 112 g 3  . fluticasone (FLONASE) 50 MCG/ACT nasal spray USE 2 SPRAYS IN EACH NOSTRIL DAILY AS NEEDED FOR ALLERGIES 48 mL 1  . gabapentin (NEURONTIN) 300 MG capsule TAKE 1 CAPSULE BY MOUTH EVERYDAY AT BEDTIME 90 capsule 4  . levothyroxine (SYNTHROID) 137 MCG tablet Take 1 tablet (137 mcg total) by mouth daily before breakfast. 90 tablet 1  . losartan-hydrochlorothiazide (HYZAAR) 100-12.5 MG tablet TAKE 1 TABLET BY MOUTH EVERY DAY 90 tablet 1  . Multiple Vitamins-Minerals (MULTIVITAMINS THER. W/MINERALS) TABS Take 1 tablet by mouth daily.      . rosuvastatin (CRESTOR) 20 MG tablet TAKE 1 TABLET BY MOUTH EVERY DAY 90 tablet 1  . sertraline (ZOLOFT) 100 MG  tablet TAKE 1 TABLET BY MOUTH EVERY DAY 90 tablet 1  . SUMAtriptan (IMITREX) 100 MG tablet TAKE 1 TABLET BY AS NEEDED FOR MIGRAINE.MAY REPEAT IN 24 HOURS AS DIRECTED 9 tablet 1  . traMADol (ULTRAM) 50 MG tablet Take 1 tablet (50 mg total) by mouth every 8 (eight) hours as needed for severe pain. 15 tablet 0   No current facility-administered medications for this visit.    OBJECTIVE: White woman who appears stated age  18:   04/26/20 1358  BP: (!) 143/76  Pulse: 88  Resp: 18  Temp: 98.3 F (36.8 C)  SpO2: 98%     Body mass index is 38.96 kg/m.   Wt Readings from Last 3 Encounters:  04/26/20 213 lb (96.6 kg)  04/19/20 215 lb 3.2 oz (97.6 kg)  03/14/20 218 lb 6.4 oz (99.1 kg)  ECOG FS:1  Sclerae unicteric, EOMs intact Wearing a mask No cervical or supraclavicular adenopathy Lungs no rales or rhonchi Heart regular rate and rhythm Abd soft, nontender, positive bowel sounds MSK no focal spinal tenderness, no upper extremity lymphedema Neuro: nonfocal, well oriented, appropriate affect Breasts: The right breast is unremarkable.  Left breast is status post lumpectomy and radiation.  There is no evidence of local recurrence.  Both axillae are benign.   LAB RESULTS:  CMP     Component Value Date/Time   NA 142 04/26/2020 1323   K 4.0 04/26/2020 1323   CL 107 04/26/2020 1323   CO2 25 04/26/2020 1323   GLUCOSE 109 (H) 04/26/2020 1323   BUN 15 04/26/2020 1323   CREATININE 0.82 04/26/2020 1323   CREATININE 0.86 01/22/2019 0915   CALCIUM 10.0 04/26/2020 1323   PROT 7.2 04/26/2020 1323   ALBUMIN 4.1 04/26/2020 1323   AST 18 04/26/2020 1323   AST 20 01/22/2019 0915   ALT 17 04/26/2020 1323   ALT 22 01/22/2019 0915   ALKPHOS 92 04/26/2020 1323   BILITOT 0.3 04/26/2020 1323   BILITOT 0.3 01/22/2019 0915   GFRNONAA >60 04/26/2020 1323   GFRNONAA >60 01/22/2019 0915   GFRAA >60 04/26/2020 1323   GFRAA >60 01/22/2019 0915    No results found for: TOTALPROTELP,  ALBUMINELP, A1GS, A2GS, BETS, BETA2SER, GAMS, MSPIKE, SPEI  No results found for: KPAFRELGTCHN, LAMBDASER, KAPLAMBRATIO  Lab Results  Component Value Date   WBC 5.1 04/26/2020   NEUTROABS 3.0 04/26/2020   HGB 13.2 04/26/2020   HCT 41.4 04/26/2020  MCV 90.4 04/26/2020   PLT 231 04/26/2020    @LASTCHEMISTRY @  No results found for: LABCA2  No components found for: AESLPN300  No results for input(s): INR in the last 168 hours.  No results found for: LABCA2  No results found for: FRT021  No results found for: RZN356  No results found for: POL410  No results found for: CA2729  No components found for: HGQUANT  No results found for: CEA1 / No results found for: CEA1   No results found for: AFPTUMOR  No results found for: CHROMOGRNA  No results found for: PSA1  Appointment on 04/26/2020  Component Date Value Ref Range Status  . Sodium 04/26/2020 142  135 - 145 mmol/L Final  . Potassium 04/26/2020 4.0  3.5 - 5.1 mmol/L Final  . Chloride 04/26/2020 107  98 - 111 mmol/L Final  . CO2 04/26/2020 25  22 - 32 mmol/L Final  . Glucose, Bld 04/26/2020 109* 70 - 99 mg/dL Final   Glucose reference range applies only to samples taken after fasting for at least 8 hours.  . BUN 04/26/2020 15  8 - 23 mg/dL Final  . Creatinine, Ser 04/26/2020 0.82  0.44 - 1.00 mg/dL Final  . Calcium 04/26/2020 10.0  8.9 - 10.3 mg/dL Final  . Total Protein 04/26/2020 7.2  6.5 - 8.1 g/dL Final  . Albumin 04/26/2020 4.1  3.5 - 5.0 g/dL Final  . AST 04/26/2020 18  15 - 41 U/L Final  . ALT 04/26/2020 17  0 - 44 U/L Final  . Alkaline Phosphatase 04/26/2020 92  38 - 126 U/L Final  . Total Bilirubin 04/26/2020 0.3  0.3 - 1.2 mg/dL Final  . GFR calc non Af Amer 04/26/2020 >60  >60 mL/min Final  . GFR calc Af Amer 04/26/2020 >60  >60 mL/min Final  . Anion gap 04/26/2020 10  5 - 15 Final   Performed at [facility], [place] [address], [place], [place] [address]  . WBC 04/26/2020 5.1   4.0 - 10.5 K/uL Final  . RBC 04/26/2020 4.58  3.87 - 5.11 MIL/uL Final  . Hemoglobin 04/26/2020 13.2  12.0 - 15.0 g/dL Final  . HCT 04/26/2020 41.4  36 - 46 % Final  . MCV 04/26/2020 90.4  80.0 - 100.0 fL Final  . MCH 04/26/2020 28.8  26.0 - 34.0 pg Final  . MCHC 04/26/2020 31.9  30.0 - 36.0 g/dL Final  . RDW 04/26/2020 13.0  11.5 - 15.5 % Final  . Platelets 04/26/2020 231  150 - 400 K/uL Final  . nRBC 04/26/2020 0.0  0.0 - 0.2 % Final  . Neutrophils Relative % 04/26/2020 59  % Final  . Neutro Abs 04/26/2020 3.0  1.7 - 7.7 K/uL Final  . Lymphocytes Relative 04/26/2020 24  % Final  . Lymphs Abs 04/26/2020 1.3  0.7 - 4.0 K/uL Final  . Monocytes Relative 04/26/2020 8  % Final  . Monocytes Absolute 04/26/2020 0.4  0 - 1 K/uL Final  . Eosinophils Relative 04/26/2020 8  % Final  . Eosinophils Absolute 04/26/2020 0.4  0 - 0 K/uL Final  . Basophils Relative 04/26/2020 1  % Final  . Basophils Absolute 04/26/2020 0.1  0 - 0 K/uL Final  . Immature Granulocytes 04/26/2020 0  % Final  . Abs Immature Granulocytes 04/26/2020 0.01  0.00 - 0.07 K/uL Final   Performed at [facility], [place] [address] [place], [place] [address]    (this displays  the last labs from the last 3 days)  No results found for: TOTALPROTELP, ALBUMINELP, A1GS, A2GS, BETS, BETA2SER, GAMS, MSPIKE, SPEI (this displays SPEP labs)  No results found for: KPAFRELGTCHN, LAMBDASER, KAPLAMBRATIO (kappa/lambda light chains)  No results found for: HGBA, HGBA2QUANT, HGBFQUANT, HGBSQUAN (Hemoglobinopathy evaluation)   No results found for: LDH  No results found for: IRON, TIBC, IRONPCTSAT (Iron and TIBC)  No results found for: FERRITIN  Urinalysis    Component Value Date/Time   COLORURINE YELLOW 09/13/2011 [place] 09/13/2011 0952   LABSPEC 1.010 09/13/2011 0952   PHURINE 6.5 09/13/2011 [place] 09/13/2011 [place] 09/13/2011 0952   HGBUR negative  04/03/2009 0949   BILIRUBINUR n 09/15/2012 [place] 09/13/2011 0952   PROTEINUR n 09/15/2012 0832   PROTEINUR NEGATIVE 09/13/2011 0952   UROBILINOGEN 0.2 09/15/2012 0832   UROBILINOGEN 0.2 09/13/2011 0952   NITRITE n 09/15/2012 0832   NITRITE NEGATIVE 09/13/2011 0952   LEUKOCYTESUR Negative 09/15/2012 0832     STUDIES:  No results found.   ELIGIBLE FOR AVAILABLE RESEARCH PROTOCOL: no   ASSESSMENT: 64 y.o. [place] woman status post left breast lower inner quadrant biopsy 07/31/2018 for a clinical T1b N0, stage IA invasive ductal carcinoma, grade 3, triple positive, with an MIB-1 of 75%.  (1) status post left lumpectomy and sentinel lymph node sampling 09/07/2018 for a pT1c pN0, stage IA invasive ductal carcinoma, grade 3, with negative margins.  (a) a total of 5 sentinel lymph nodes were removed  (2) adjuvant chemotherapy consisting of paclitaxel weekly x12 with trastuzumab every 21 days starting 10/02/2018, completed 12/25/2018  (3) trastuzumab continued to complete 1 year (through December 2020).  (a) echocardiogram 08/17/2018 shows an ejection fraction of 60-65%  (b) echocardiogram on 11/17/2018 shows an ejection fraction of 60-65%  (c) echo 02/08/2019 shows an ejection fraction in the 60-65%  (d) Echo in 05/2019 shows EF of 60-65%  (e) Echo on 09/16/2019 shows EF of 60-65%  (4) adjuvant radiation 02/04/2019 - 03/04/2019  (a) Left breast treated to 42.56 Gy in 16 fractions  (b) Seroma boost of 8 Gy in 4 fractions, for a total of 50.56 Gy  (5) started anastrozole 04/11/2019  (a) bone density scan 11/05/2019 showed a T score of -0.3, which is normal   PLAN:  Tiffany Montes is now a year and a half out from definitive surgery for her breast cancer with no evidence of disease recurrence.  This is very febrile.  She is tolerating anastrozole remarkably well and the plan will be to continue that a total of 5 years.  Her bone density January of this year was  normal.  We will repeat that 2 years from that date  She is getting tired of seeing so many doctors she says.  She has not seen Dr. Nori Riis in a while and I suggest that she see him in October.  She can then see me next February.  I encouraged her to exercise as much as she wants and particularly to do some water aerobics  Total encounter time 25 minutes.Wilber Bihari, NP  04/26/20 2:12 PM Medical Oncology and Hematology [facility] [address] [place], [place] [address] Tel. [phone]    Fax. [phone]

## 2020-04-28 ENCOUNTER — Other Ambulatory Visit: Payer: Self-pay | Admitting: *Deleted

## 2020-04-28 ENCOUNTER — Other Ambulatory Visit: Payer: Self-pay | Admitting: Family Medicine

## 2020-04-28 MED ORDER — SUMATRIPTAN SUCCINATE 100 MG PO TABS: ORAL_TABLET | ORAL | 1 refills | Status: DC

## 2020-04-28 NOTE — Telephone Encounter (Signed)
Rx sent in as requested. 

## 2020-04-28 NOTE — Telephone Encounter (Signed)
Pt is calling in for a refill on Rx sumatriptan (IMITREX) 100 MG   [facility] on [address]

## 2020-05-17 ENCOUNTER — Telehealth: Payer: Self-pay | Admitting: Family Medicine

## 2020-05-17 NOTE — Telephone Encounter (Signed)
Pt is calling in stating that she would like to have the generic amphetamine-dextroamphetamine (ADDERALL XR) 30 MG  [facility]arm[facility] [address].   Pt also stated that she will be transferring all of [facility] medication from [facility] [address]

## 2020-05-18 ENCOUNTER — Other Ambulatory Visit: Payer: Self-pay | Admitting: Family Medicine

## 2020-05-19 NOTE — Telephone Encounter (Signed)
Please advise 

## 2020-05-21 MED ORDER — AMPHETAMINE-DEXTROAMPHET ER 30 MG PO CP24: ORAL_CAPSULE | ORAL | 0 refills | Status: DC

## 2020-05-21 MED ORDER — AMPHETAMINE-DEXTROAMPHET ER 30 MG PO CP24: 30.0000 mg | ORAL_CAPSULE | ORAL | 0 refills | Status: DC

## 2020-05-21 NOTE — Addendum Note (Signed)
Addended by: Eulas Post on: 05/21/2020 09:45 AM   Modules accepted: Orders

## 2020-05-26 ENCOUNTER — Other Ambulatory Visit: Payer: Self-pay | Admitting: Family Medicine

## 2020-05-27 ENCOUNTER — Other Ambulatory Visit: Payer: Self-pay | Admitting: Family Medicine

## 2020-07-03 ENCOUNTER — Telehealth: Payer: Self-pay | Admitting: Family Medicine

## 2020-07-03 ENCOUNTER — Other Ambulatory Visit: Payer: Self-pay | Admitting: Oncology

## 2020-07-03 DIAGNOSIS — N63 Unspecified lump in unspecified breast: Secondary | ICD-10-CM

## 2020-07-03 NOTE — Telephone Encounter (Signed)
Pt is requesting a refill for amphetamine-dextroamphetamine (ADDERALL XR) 30 MG 24 hr capsule sent to this pharmacy -[facility] [address]. [place], [place] [address] [phone]

## 2020-07-03 NOTE — Telephone Encounter (Signed)
This was refilled 05-21-20 with 2 refills left.  Has she checked at pharmacy?   Should have refills.

## 2020-07-04 ENCOUNTER — Other Ambulatory Visit: Payer: Self-pay

## 2020-07-04 ENCOUNTER — Other Ambulatory Visit: Payer: Medicare Other

## 2020-07-04 DIAGNOSIS — E039 Hypothyroidism, unspecified: Secondary | ICD-10-CM

## 2020-07-04 LAB — TSH: TSH: 5.01 mIU/L — ABNORMAL HIGH (ref 0.40–4.50)

## 2020-07-04 NOTE — Addendum Note (Signed)
Addended by: Marrion Coy on: 07/04/2020 10:05 AM   Modules accepted: Orders

## 2020-07-05 ENCOUNTER — Other Ambulatory Visit: Payer: Self-pay | Admitting: Family Medicine

## 2020-07-05 DIAGNOSIS — E039 Hypothyroidism, unspecified: Secondary | ICD-10-CM

## 2020-07-05 MED ORDER — LEVOTHYROXINE SODIUM 150 MCG PO TABS: ORAL_TABLET | ORAL | 0 refills | Status: DC

## 2020-07-05 NOTE — Telephone Encounter (Signed)
-----   Message from Eulas Post, MD sent at 07/05/2020  7:26 AM EDT ----- TSH is improved but still not quite to goal.  Increase Levothyroxine to 150 mcg once daily and repeat TSH in 3 months.

## 2020-07-05 NOTE — Telephone Encounter (Signed)
Left message on machine for patient to return our call 

## 2020-07-05 NOTE — Telephone Encounter (Signed)
SA-New dosage entered can you plz send to pharmacy?  LMOVM for pt to please view results via her MyChart and that a new dosage was sent to the pharm and plz call office to sched lab visit in 3 months/created future order for TSH per PCP/thx dmf

## 2020-07-07 ENCOUNTER — Other Ambulatory Visit: Payer: Self-pay | Admitting: Family Medicine

## 2020-07-07 NOTE — Telephone Encounter (Signed)
Returned patient's call and let her know that her Rx was available at the pharmacy. She said she had called pharmacy and confirmed that Rx was available.

## 2020-07-07 NOTE — Telephone Encounter (Signed)
The patient is returning a called

## 2020-07-07 NOTE — Telephone Encounter (Signed)
Patient last filled clonazepam on 04/27/2020.   Last OV with PCP on 04/19/2020. No upcoming appointments with PCP at this time.   Please advise on refill at your convenience.

## 2020-08-01 ENCOUNTER — Ambulatory Visit
Admission: RE | Admit: 2020-08-01 | Discharge: 2020-08-01 | Disposition: A | Discharge status: Home / Self Care | Payer: Medicare Other | Source: Ambulatory Visit | Attending: Oncology | Admitting: Oncology

## 2020-08-01 ENCOUNTER — Other Ambulatory Visit: Payer: Self-pay

## 2020-08-01 DIAGNOSIS — N63 Unspecified lump in unspecified breast: Secondary | ICD-10-CM

## 2020-08-17 ENCOUNTER — Other Ambulatory Visit: Payer: Self-pay | Admitting: Family Medicine

## 2020-08-21 NOTE — Telephone Encounter (Signed)
May refill once. 

## 2020-09-18 ENCOUNTER — Ambulatory Visit (INDEPENDENT_AMBULATORY_CARE_PROVIDER_SITE_OTHER): Payer: Medicare Other | Admitting: Family Medicine

## 2020-09-18 ENCOUNTER — Other Ambulatory Visit: Payer: Self-pay

## 2020-09-18 ENCOUNTER — Encounter: Payer: Self-pay | Admitting: Family Medicine

## 2020-09-18 VITALS — BP 118/80 | HR 83 | Temp 97.7°F | Ht 62.0 in | Wt 212.5 lb

## 2020-09-18 DIAGNOSIS — E039 Hypothyroidism, unspecified: Secondary | ICD-10-CM

## 2020-09-18 DIAGNOSIS — F988 Other specified behavioral and emotional disorders with onset usually occurring in childhood and adolescence: Secondary | ICD-10-CM | POA: Diagnosis not present

## 2020-09-18 DIAGNOSIS — Z6841 Body Mass Index (BMI) 40.0 and over, adult: Secondary | ICD-10-CM

## 2020-09-18 DIAGNOSIS — I10 Essential (primary) hypertension: Secondary | ICD-10-CM

## 2020-09-18 NOTE — Progress Notes (Signed)
Established Patient Office Visit  Subjective:  Patient ID: Tiffany Montes, female    DOB: [birth year]  Age: 64 y.o. MRN: [id number]  CC:  Chief Complaint  Patient presents with  . Medication f/u  . Hypothyroidism    HPI [name] presents for medical follow-up.  She has hypothyroidism and has had persistent elevated TSH and we have been inching up her dosage of levothyroxine and is currently on 150 mcg daily.  She states she takes this first thing in the morning on empty stomach without any other medications.  She appears to be taking this appropriately.  She has had weight gain over the past several years but not getting a lot of activity.  She has hypertension treated with losartan HCTZ.  Compliant with therapy.  Blood pressure stable today.  She has history of ADD on Adderall XR 30 mg daily.  She apparently had difficulties with insurance coverage and cost even with the generic extended release.  She is contemplating going back on the immediate release.  She has been out of work on disability.  She is in process of trying to get disability.  Previously worked at [employer] and did fairly physical work which she feels like would be impossible at this time with all of her joint problems.  She has history of breast cancer and remains on anastrozole.  She feels like she had increased myalgias since starting that medication.  Past Medical History:  Diagnosis Date  . Anxiety   . Attention deficit disorder   . Breast cancer ([place]) 2019   Left Breast Cancer  . Depression   . Gallstones 09/05/2011  . Hyperlipidemia   . Hypertension   . Hypothyroidism   . Migraines   . Personal history of chemotherapy 2019   Left Breast Cancer  . Personal history of radiation therapy 2019   Left Breast Cancer  . PONV (postoperative nausea and vomiting)    diffficulty waking up  . Thyroid disease    hypothyroid    Past Surgical History:  Procedure Laterality Date  . ABDOMINAL  HYSTERECTOMY  2003  . BREAST BIOPSY Left 2014   benign  . BREAST LUMPECTOMY Left 09/07/2018  . BREAST LUMPECTOMY WITH RADIOACTIVE SEED AND SENTINEL LYMPH NODE BIOPSY Left 09/07/2018   Procedure: LEFT BREAST LUMPECTOMY WITH RADIOACTIVE SEED AND AXILLARY SENTINEL LYMPH NODE BIOPSY,INJECT BLUE DYE LEFT BREAST;  Surgeon: Fanny Skates, MD;  Location: [place];  Service: General;  Laterality: Left;  . CHOLECYSTECTOMY  09/26/2011   Procedure: LAPAROSCOPIC CHOLECYSTECTOMY WITH INTRAOPERATIVE CHOLANGIOGRAM;  Surgeon: Haywood Lasso, MD;  Location: [place];  Service: General;  Laterality: N/A;  . COLONOSCOPY    . CYSTIC HYGROMA EXCISION    . DENTAL SURGERY     skin graft from top of mouth to lower gums  . PORT-A-CATH REMOVAL Right 11/04/2019   Procedure: REMOVAL PORT-A-CATH;  Surgeon: Donnie Mesa, MD;  Location: [place];  Service: General;  Laterality: Right;  . PORTACATH PLACEMENT N/A 09/07/2018   Procedure: INSERTION PORT-A-CATH WITH [place];  Surgeon: Fanny Skates, MD;  Location: [facility] OR;  Service: General;  Laterality: N/A;    Family History  Problem Relation Age of Onset  . Diabetes Father   . Heart disease Father   . Lung cancer Father   . Breast cancer Neg Hx   . Ovarian cancer Neg Hx     Social History   Socioeconomic History  . Marital status: Divorced    Spouse name: Not  on file  . Number of children: Not on file  . Years of education: Not on file  . Highest education level: Not on file  Occupational History  . Not on file  Tobacco Use  . Smoking status: Never Smoker  . Smokeless tobacco: Never Used  Vaping Use  . Vaping Use: Never used  Substance and Sexual Activity  . Alcohol use: Yes    Comment: 1-2 a week and reports not every week  . Drug use: No  . Sexual activity: Not Currently    Birth control/protection: Surgical  Other Topics Concern  . Not on file  Social History Narrative  . Not on file   Social Determinants of Health   Financial  Resource Strain: Not on file  Food Insecurity: Not on file  Transportation Needs: Not on file  Physical Activity: Not on file  Stress: Not on file  Social Connections: Not on file  Intimate Partner Violence: Not on file    Outpatient Medications Prior to Visit  Medication Sig Dispense Refill  . amphetamine-dextroamphetamine (ADDERALL XR) 30 MG 24 hr capsule Take one capsule by mouth every morning.  May refill in one month. 30 capsule 0  . amphetamine-dextroamphetamine (ADDERALL XR) 30 MG 24 hr capsule Take one capsule by mouth every morning.  May refill in two months. 30 capsule 0  . amphetamine-dextroamphetamine (ADDERALL XR) 30 MG 24 hr capsule Take 1 capsule (30 mg total) by mouth every morning. 30 capsule 0  . anastrozole (ARIMIDEX) 1 MG tablet TAKE 1 TABLET BY MOUTH EVERY DAY 90 tablet 4  . augmented betamethasone dipropionate (DIPROLENE-AF) 0.05 % cream APPLY TO AFFECTED AREA TWICE A DAY AS NEEDED FOR ECZEMA 30 g 2  . buPROPion (WELLBUTRIN XL) 300 MG 24 hr tablet TAKE 1 TABLET BY MOUTH EVERY DAY 90 tablet 1  . celecoxib (CELEBREX) 200 MG capsule Take by mouth 2 (two) times daily.    . Cholecalciferol (VITAMIN D3 PO) Take 1 tablet by mouth daily.    . clonazePAM (KLONOPIN) 0.5 MG tablet TAKE 1 TABLET BY MOUTH EVERY NIGHT AT BEDTIME AS NEEDED 30 tablet 5  . fluticasone (FLONASE) 50 MCG/ACT nasal spray USE 2 SPRAYS IN EACH NOSTRIL DAILY AS NEEDED FOR ALLERGIES 48 mL 1  . gabapentin (NEURONTIN) 300 MG capsule TAKE 1 CAPSULE BY MOUTH EVERYDAY AT BEDTIME 90 capsule 4  . levothyroxine (SYNTHROID) 150 MCG tablet D/C 17mcg/Start 161mcg 1qd; recheck lab in 3 months 90 tablet 0  . losartan-hydrochlorothiazide (HYZAAR) 100-12.5 MG tablet TAKE 1 TABLET BY MOUTH EVERY DAY 90 tablet 1  . Multiple Vitamins-Minerals (MULTIVITAMINS THER. W/MINERALS) TABS Take 1 tablet by mouth daily.    . rosuvastatin (CRESTOR) 20 MG tablet TAKE 1 TABLET BY MOUTH EVERY DAY 90 tablet 1  . sertraline (ZOLOFT) 100 MG  tablet TAKE 1 TABLET BY MOUTH EVERY DAY 90 tablet 1  . SUMAtriptan (IMITREX) 100 MG tablet TAKE 1 TABLET BY MOUTH AS NEEDED FOR MIGRAINE, MAY REPEAT IN 24 HOURS AS DIRECTED 9 tablet 1  . benzonatate (TESSALON) 100 MG capsule TAKE 1 CAPSULE BY MOUTH 2 TIMES DAILY AS NEEDED FOR COUGH 40 capsule 0  . BIOTIN PO Take 1 tablet by mouth daily.    . Diclofenac Sodium 2 % SOLN Place 2 g onto the skin 2 (two) times daily. 112 g 3  . traMADol (ULTRAM) 50 MG tablet Take 1 tablet (50 mg total) by mouth every 8 (eight) hours as needed for severe pain. 15 tablet 0  No facility-administered medications prior to visit.    Allergies  Allergen Reactions  . Morphine Sulfate Nausea And Vomiting and Rash    GI upset    ROS Review of Systems  Constitutional: Negative for appetite change, chills, fever and unexpected weight change.  Respiratory: Negative for shortness of breath.   Cardiovascular: Negative for chest pain.  Endocrine: Negative for cold intolerance.      Objective:    Physical Exam Vitals reviewed.  Constitutional:      Appearance: Normal appearance.  Cardiovascular:     Rate and Rhythm: Normal rate and regular rhythm.  Pulmonary:     Effort: Pulmonary effort is normal.     Breath sounds: Normal breath sounds.  Musculoskeletal:     Right lower leg: No edema.     Left lower leg: No edema.  Neurological:     Mental Status: She is alert.     BP 118/80 (BP Location: Right Arm, Patient Position: Sitting, Cuff Size: Large)   Pulse 83   Temp 97.7 F (36.5 C) (Temporal)   Ht 5\' 2"  (1.575 m)   Wt 212 lb 8 oz (96.4 kg)   SpO2 97%   BMI 38.87 kg/m  Wt Readings from Last 3 Encounters:  09/18/20 212 lb 8 oz (96.4 kg)  04/26/20 213 lb (96.6 kg)  04/19/20 215 lb 3.2 oz (97.6 kg)     Health Maintenance Due  Topic Date Due  . HIV Screening  Never done    There are no preventive care reminders to display for this patient.  Lab Results  Component Value Date   TSH 5.01 (H)  07/04/2020   Lab Results  Component Value Date   WBC 5.1 04/26/2020   HGB 13.2 04/26/2020   HCT 41.4 04/26/2020   MCV 90.4 04/26/2020   PLT 231 04/26/2020   Lab Results  Component Value Date   NA 142 04/26/2020   K 4.0 04/26/2020   CO2 25 04/26/2020   GLUCOSE 109 (H) 04/26/2020   BUN 15 04/26/2020   CREATININE 0.82 04/26/2020   BILITOT 0.3 04/26/2020   ALKPHOS 92 04/26/2020   AST 18 04/26/2020   ALT 17 04/26/2020   PROT 7.2 04/26/2020   ALBUMIN 4.1 04/26/2020   CALCIUM 10.0 04/26/2020   ANIONGAP 10 04/26/2020   GFR 81.27 03/14/2020   Lab Results  Component Value Date   CHOL 155 01/18/2020   Lab Results  Component Value Date   HDL 58.70 01/18/2020   Lab Results  Component Value Date   LDLCALC 84 01/18/2020   Lab Results  Component Value Date   TRIG 62.0 01/18/2020   Lab Results  Component Value Date   CHOLHDL 3 01/18/2020   Lab Results  Component Value Date   HGBA1C 6.1 01/18/2020      Assessment & Plan:   #1 hypothyroidism.  Recently under replaced -Recheck TSH  #2 hypertension stable -Continue losartan HCTZ 100/12.5 mg once daily  #3 obesity -We strongly challenged her to try to work on calorie reduction and increasing activity levels and losing some weight.  #4 attention deficit disorder currently treated with Adderall XR 30 mg daily.  Patient considering going back to immediate release because of cost issues.  No orders of the defined types were placed in this encounter.   Follow-up: No follow-ups on file.    Carolann Littler, MD

## 2020-09-19 ENCOUNTER — Other Ambulatory Visit: Payer: Self-pay | Admitting: Family Medicine

## 2020-09-19 LAB — TSH: TSH: 1.07 mIU/L (ref 0.40–4.50)

## 2020-09-25 ENCOUNTER — Telehealth: Payer: Self-pay | Admitting: Family Medicine

## 2020-09-26 ENCOUNTER — Other Ambulatory Visit: Payer: Self-pay | Admitting: Family Medicine

## 2020-09-26 MED ORDER — LEVOTHYROXINE SODIUM 150 MCG PO TABS: 150.0000 ug | ORAL_TABLET | Freq: Every day | ORAL | 3 refills | Status: DC

## 2020-09-26 NOTE — Telephone Encounter (Signed)
Left message informing patient that she should be taking levothyroxine 150mcg daily.

## 2020-09-26 NOTE — Telephone Encounter (Signed)
Levothyroxine 145mcg sent to pharmacy.  Patient aware.

## 2020-09-26 NOTE — Telephone Encounter (Signed)
Patient called and wanted to know if she was supposed to take the levothyroxine 150 or the 137, please advise. CB is [phone]

## 2020-09-26 NOTE — Addendum Note (Signed)
Addended by: Amado Coe on: 09/26/2020 03:30 PM   Modules accepted: Orders

## 2020-09-26 NOTE — Addendum Note (Signed)
Addended by: Amado Coe on: 09/26/2020 02:55 PM   Modules accepted: Orders

## 2020-09-26 NOTE — Telephone Encounter (Signed)
Patient is calling and stated that she doesn't have the medication for levothyroxine 150 and needs it to be sent to [facility] [address], please advise. CB is [phone]

## 2020-10-02 ENCOUNTER — Other Ambulatory Visit: Payer: Self-pay | Admitting: Family Medicine

## 2020-10-25 ENCOUNTER — Telehealth: Payer: Self-pay | Admitting: Family Medicine

## 2020-10-25 NOTE — Telephone Encounter (Signed)
Pt is calling in needing a refill on Rx amphetamine-dextroamphetamine (ADDERALL XR) 30 MG  Pharm:  [facility] [address]

## 2020-10-26 MED ORDER — AMPHETAMINE-DEXTROAMPHET ER 30 MG PO CP24: ORAL_CAPSULE | ORAL | 0 refills | Status: DC

## 2020-10-26 MED ORDER — AMPHETAMINE-DEXTROAMPHET ER 30 MG PO CP24: 30.0000 mg | ORAL_CAPSULE | ORAL | 0 refills | Status: DC

## 2020-11-08 ENCOUNTER — Encounter (INDEPENDENT_AMBULATORY_CARE_PROVIDER_SITE_OTHER): Payer: Self-pay

## 2020-11-11 ENCOUNTER — Other Ambulatory Visit: Payer: Self-pay | Admitting: Family Medicine

## 2020-11-13 ENCOUNTER — Ambulatory Visit (INDEPENDENT_AMBULATORY_CARE_PROVIDER_SITE_OTHER): Payer: Self-pay | Admitting: Family Medicine

## 2020-11-22 ENCOUNTER — Telehealth: Payer: Self-pay

## 2020-11-22 ENCOUNTER — Inpatient Hospital Stay: Payer: Medicare Other

## 2020-11-22 ENCOUNTER — Other Ambulatory Visit: Payer: Self-pay

## 2020-11-22 ENCOUNTER — Inpatient Hospital Stay: Payer: Medicare Other | Attending: Oncology | Admitting: Oncology

## 2020-11-22 VITALS — BP 144/93 | HR 102 | Temp 98.1°F | Resp 18 | Ht 62.0 in | Wt 218.2 lb

## 2020-11-22 DIAGNOSIS — Z803 Family history of malignant neoplasm of breast: Secondary | ICD-10-CM | POA: Insufficient documentation

## 2020-11-22 DIAGNOSIS — C50212 Malignant neoplasm of upper-inner quadrant of left female breast: Secondary | ICD-10-CM

## 2020-11-22 DIAGNOSIS — R232 Flushing: Secondary | ICD-10-CM | POA: Insufficient documentation

## 2020-11-22 DIAGNOSIS — Z801 Family history of malignant neoplasm of trachea, bronchus and lung: Secondary | ICD-10-CM | POA: Diagnosis not present

## 2020-11-22 DIAGNOSIS — Z79811 Long term (current) use of aromatase inhibitors: Secondary | ICD-10-CM | POA: Insufficient documentation

## 2020-11-22 DIAGNOSIS — E039 Hypothyroidism, unspecified: Secondary | ICD-10-CM | POA: Diagnosis not present

## 2020-11-22 DIAGNOSIS — E785 Hyperlipidemia, unspecified: Secondary | ICD-10-CM | POA: Insufficient documentation

## 2020-11-22 DIAGNOSIS — C50312 Malignant neoplasm of lower-inner quadrant of left female breast: Secondary | ICD-10-CM | POA: Diagnosis not present

## 2020-11-22 DIAGNOSIS — Z170 Estrogen receptor positive status [ER+]: Secondary | ICD-10-CM

## 2020-11-22 DIAGNOSIS — Z9221 Personal history of antineoplastic chemotherapy: Secondary | ICD-10-CM | POA: Diagnosis not present

## 2020-11-22 DIAGNOSIS — Z79899 Other long term (current) drug therapy: Secondary | ICD-10-CM | POA: Insufficient documentation

## 2020-11-22 DIAGNOSIS — I10 Essential (primary) hypertension: Secondary | ICD-10-CM | POA: Insufficient documentation

## 2020-11-22 LAB — COMPREHENSIVE METABOLIC PANEL
ALT: 21 U/L (ref 0–44)
AST: 16 U/L (ref 15–41)
Albumin: 4.2 g/dL (ref 3.5–5.0)
Alkaline Phosphatase: 85 U/L (ref 38–126)
Anion gap: 11 (ref 5–15)
BUN: 14 mg/dL (ref 8–23)
CO2: 20 mmol/L — ABNORMAL LOW (ref 22–32)
Calcium: 9.1 mg/dL (ref 8.9–10.3)
Chloride: 107 mmol/L (ref 98–111)
Creatinine, Ser: 0.89 mg/dL (ref 0.44–1.00)
GFR, Estimated: >60 mL/min (ref >60)
Glucose, Bld: 139 mg/dL — ABNORMAL HIGH (ref 70–99)
Potassium: 3.3 mmol/L — ABNORMAL LOW (ref 3.5–5.1)
Sodium: 138 mmol/L (ref 135–145)
Total Bilirubin: 0.4 mg/dL (ref 0.3–1.2)
Total Protein: 7 g/dL (ref 6.5–8.1)

## 2020-11-22 LAB — CBC WITH DIFFERENTIAL/PLATELET
Abs Immature Granulocytes: 0.07 10*3/uL (ref 0.00–0.07)
Basophils Absolute: 0 10*3/uL (ref 0.0–0.1)
Basophils Relative: 0 %
Eosinophils Absolute: 0 10*3/uL (ref 0.0–0.5)
Eosinophils Relative: 0 %
HCT: 38.4 % (ref 36.0–46.0)
Hemoglobin: 13 g/dL (ref 12.0–15.0)
Immature Granulocytes: 1 %
Lymphocytes Relative: 5 %
Lymphs Abs: 0.6 10*3/uL — ABNORMAL LOW (ref 0.7–4.0)
MCH: 28.3 pg (ref 26.0–34.0)
MCHC: 33.9 g/dL (ref 30.0–36.0)
MCV: 83.7 fL (ref 80.0–100.0)
Monocytes Absolute: 0.2 10*3/uL (ref 0.1–1.0)
Monocytes Relative: 2 %
Neutro Abs: 10.7 10*3/uL — ABNORMAL HIGH (ref 1.7–7.7)
Neutrophils Relative %: 92 %
Platelets: 241 10*3/uL (ref 150–400)
RBC: 4.59 MIL/uL (ref 3.87–5.11)
RDW: 12.9 % (ref 11.5–15.5)
WBC: 11.6 10*3/uL — ABNORMAL HIGH (ref 4.0–10.5)
nRBC: 0 % (ref 0.0–0.2)

## 2020-11-22 MED ORDER — TAMOXIFEN CITRATE 20 MG PO TABS: 20.0000 mg | ORAL_TABLET | Freq: Every day | ORAL | 4 refills | Status: AC

## 2020-11-22 NOTE — Progress Notes (Signed)
[facility]  [phone] [phone]    ID: Tiffany Montes DOB: April 20, 1956  MR#: [id number]  XNA#:[id number]  Patient Care Team: Eulas Post, MD as PCP - General Lanorris Kalisz, Virgie Dad, MD as Consulting Physician (Oncology) Kyung Rudd, MD as Consulting Physician (Radiation Oncology) Maisie Fus, MD as Consulting Physician (Obstetrics and Gynecology) Larey Dresser, MD as Consulting Physician (Cardiology) OTHER MD:    CHIEF COMPLAINT: Estrogen receptor positive breast cancer, HER-2 amplified  CURRENT TREATMENT: Transitioning from anastrozole to tamoxifen   INTERVAL HISTORY: Tiffany Montes returns today for follow-up of her estrogen receptor positive and HER2 amplified breast cancer.    She continues on anastrozole.  She tolerates this well, with minimal hot flashes and mild vaginal dryness issues.  However she is having some progressive right hand issues.  She does have some carpal tunnel there but it seems to be aggravated by the anastrozole and she is developing a little bit of what might be early neuropathy in the left hand as well.  Her feet are fine.  Her most recent bone density screening from 11/05/2019 showed a T-score of -0.3, which is considered normal.  Since her last visit, she underwent bilateral diagnostic mammography with tomography at [facility] on 08/01/2020 showing: breast density category B; no evidence of malignancy in either breast.    REVIEW OF SYSTEMS: Tiffany Montes has joined a health and wellness program through [facility] and is excited about that.  She had knee replacement last July.  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  She says she has about 5 months to go on that before she finally goes on Medicare.  Detailed review of systems was otherwise stable   COVID 19 VACCINATION STATUS: Status post Pfizer x2 with booster August 28, 2020   HISTORY OF CURRENT ILLNESS: From the original intake  note:  "Tiffany Montes" had routine screening mammography on 07/22/2018 showing a possible abnormality in the left breast. She underwent unilateral diagnostic mammography with tomography and left breast ultrasonography at [facility] on 07/28/2018 showing: Breast density Category C; a 7 x 8 x 8 mm hypoechoic mass in the left breast lower inner quadrant, consistent with a complex cyst.. No abnormal lymph nodes in the left axilla.  Accordingly on 07/31/2018 she proceeded to biopsy of the left breast area in question. The pathology from this procedure showed ([id number]): Invasive Ductal Carcinoma, Grade 3. Prognostic indicators significant for: estrogen receptor, 95% positive and progesterone receptor, 85% positive, both with strong staining intensity. Proliferation marker Ki67 at 75%. HER2 positive by immunohistochemistry, 3+.   The patient's subsequent history is as detailed below.   PAST MEDICAL HISTORY: Past Medical History:  Diagnosis Date  . Anxiety   . Attention deficit disorder   . Breast cancer ([place]) 2019   Left Breast Cancer  . Depression   . Gallstones 09/05/2011  . Hyperlipidemia   . Hypertension   . Hypothyroidism   . Migraines   . Personal history of chemotherapy 2019   Left Breast Cancer  . Personal history of radiation therapy 2019   Left Breast Cancer  . PONV (postoperative nausea and vomiting)    diffficulty waking up  . Thyroid disease    hypothyroid    PAST SURGICAL HISTORY: Past Surgical History:  Procedure Laterality Date  . ABDOMINAL HYSTERECTOMY  2003  . BREAST BIOPSY Left 2014   benign  . BREAST LUMPECTOMY Left 09/07/2018  . BREAST LUMPECTOMY WITH RADIOACTIVE SEED  AND SENTINEL LYMPH NODE BIOPSY Left 09/07/2018   Procedure: LEFT BREAST LUMPECTOMY WITH RADIOACTIVE SEED AND AXILLARY SENTINEL LYMPH NODE BIOPSY,INJECT BLUE DYE LEFT BREAST;  Surgeon: Fanny Skates, MD;  Location: [facility];  Service: General;  Laterality: Left;  . CHOLECYSTECTOMY  09/26/2011    Procedure: LAPAROSCOPIC CHOLECYSTECTOMY WITH INTRAOPERATIVE CHOLANGIOGRAM;  Surgeon: Haywood Lasso, MD;  Location: [facility];  Service: General;  Laterality: N/A;  . COLONOSCOPY    . CYSTIC HYGROMA EXCISION    . DENTAL SURGERY     skin graft from top of mouth to lower gums  . PORT-A-CATH REMOVAL Right 11/04/2019   Procedure: REMOVAL PORT-A-CATH;  Surgeon: Donnie Mesa, MD;  Location: [facility];  Service: General;  Laterality: Right;  . PORTACATH PLACEMENT N/A 09/07/2018   Procedure: INSERTION PORT-A-CATH WITH [place];  Surgeon: Fanny Skates, MD;  Location: [facility];  Service: General;  Laterality: N/A;    FAMILY HISTORY: Family History  Problem Relation Age of Onset  . Diabetes Father   . Heart disease Father   . Lung cancer Father   . Breast cancer Neg Hx   . Ovarian cancer Neg Hx   She notes that her father died from CHF at age 53.  He was diagnosed with lung cancer at age 36. Patients' mother is 35 years old as of November 2019. The patient has 1 brother and 2 sisters. Patient denies a family history of ovarian or breast cancer.   GYNECOLOGIC HISTORY:  No LMP recorded. Patient has had a hysterectomy. 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 She has been unable to work for the last 19 months for a variety of medical issues.  She is divorced and lives by herself with her 2 dogs. Her daughter [name] is a Dealer in [place], [name].  The patient's son, [name] is a Training and development officer and lives in [place]. The patient has 2 grandchildren and 1 great-grand child.  She is not a church attender   ADVANCED DIRECTIVES: Her [place] is her daughter, [name], [phone].     HEALTH  MAINTENANCE: Social History   Tobacco Use  . Smoking status: Never Smoker  . Smokeless tobacco: Never Used  Vaping Use  . Vaping Use: Never used  Substance Use Topics  . Alcohol use: Yes    Comment: 1-2 a week and reports not every week  . Drug use: No    Colonoscopy: 05/2019, Dr. Earlean Shawl, 1 adenoma  PAP: 1 month ago  Bone density: Yes, 2 years ago   Allergies  Allergen Reactions  . Morphine Sulfate Nausea And Vomiting and Rash    GI upset    Current Outpatient Medications  Medication Sig Dispense Refill  . amphetamine-dextroamphetamine (ADDERALL XR) 30 MG 24 hr capsule Take one capsule by mouth every morning.  May refill in one month. 30 capsule 0  . amphetamine-dextroamphetamine (ADDERALL XR) 30 MG 24 hr capsule Take one capsule by mouth every morning.  May refill in two months. 30 capsule 0  . amphetamine-dextroamphetamine (ADDERALL XR) 30 MG 24 hr capsule Take 1 capsule (30 mg total) by mouth every morning. 30 capsule 0  . anastrozole (ARIMIDEX) 1 MG tablet TAKE 1 TABLET BY MOUTH EVERY DAY 90 tablet  4  . augmented betamethasone dipropionate (DIPROLENE-AF) 0.05 % cream APPLY TO AFFECTED AREA TWICE A DAY AS NEEDED FOR ECZEMA 30 g 2  . buPROPion (WELLBUTRIN XL) 300 MG 24 hr tablet TAKE 1 TABLET BY MOUTH EVERY DAY 90 tablet 1  . celecoxib (CELEBREX) 200 MG capsule Take by mouth 2 (two) times daily.    . Cholecalciferol (VITAMIN D3 PO) Take 1 tablet by mouth daily.    . clonazePAM (KLONOPIN) 0.5 MG tablet TAKE 1 TABLET BY MOUTH EVERY NIGHT AT BEDTIME AS NEEDED 30 tablet 5  . fluticasone (FLONASE) 50 MCG/ACT nasal spray USE 2 SPRAYS IN EACH NOSTRIL DAILY AS NEEDED FOR ALLERGIES 48 mL 1  . gabapentin (NEURONTIN) 300 MG capsule TAKE 1 CAPSULE BY MOUTH EVERYDAY AT BEDTIME 90 capsule 4  . levothyroxine (SYNTHROID) 150 MCG tablet TAKE 1 TABLET BY MOUTH EVERY DAY(RECHECK LAB IN 3 MONTHS) 90 tablet 3  . losartan-hydrochlorothiazide (HYZAAR) 100-12.5 MG tablet TAKE 1 TABLET BY MOUTH EVERY  DAY 90 tablet 1  . Multiple Vitamins-Minerals (MULTIVITAMINS THER. W/MINERALS) TABS Take 1 tablet by mouth daily.    . rosuvastatin (CRESTOR) 20 MG tablet TAKE 1 TABLET BY MOUTH EVERY DAY 90 tablet 1  . sertraline (ZOLOFT) 100 MG tablet TAKE 1 TABLET BY MOUTH EVERY DAY 90 tablet 1  . SUMAtriptan (IMITREX) 100 MG tablet TAKE 1 TABLET BY MOUTH AS NEEDED FOR MIGRAINE *MAY REPEAT IN 24 HOURS AS DIRECTED 9 tablet 1   No current facility-administered medications for this visit.    OBJECTIVE: White woman who appears stated age  45:   11/22/20 1436  BP: (!) 144/93  Pulse: (!) 102  Resp: 18  Temp: 98.1 F (36.7 C)  SpO2: 97%     Body mass index is 39.91 kg/m.   Wt Readings from Last 3 Encounters:  11/22/20 218 lb 3.2 oz (99 kg)  09/18/20 212 lb 8 oz (96.4 kg)  04/26/20 213 lb (96.6 kg)  ECOG FS:1  Sclerae unicteric, EOMs intact Wearing a mask No cervical or supraclavicular adenopathy Lungs no rales or rhonchi Heart regular rate and rhythm Abd soft, nontender, positive bowel sounds MSK no focal spinal tenderness, no upper extremity lymphedema Neuro: nonfocal, well oriented, appropriate affect Breasts: The right breast is benign.  The left breast is status post lumpectomy and radiation.  There is no evidence of local recurrence.  Both axillae are benign.   LAB RESULTS:  CMP     Component Value Date/Time   NA 138 11/22/2020 1416   K 3.3 (L) 11/22/2020 1416   CL 107 11/22/2020 1416   CO2 20 (L) 11/22/2020 1416   GLUCOSE 139 (H) 11/22/2020 1416   BUN 14 11/22/2020 1416   CREATININE 0.89 11/22/2020 1416   CREATININE 0.86 01/22/2019 0915   CALCIUM 9.1 11/22/2020 1416   PROT 7.0 11/22/2020 1416   ALBUMIN 4.2 11/22/2020 1416   AST 16 11/22/2020 1416   AST 20 01/22/2019 0915   ALT 21 11/22/2020 1416   ALT 22 01/22/2019 0915   ALKPHOS 85 11/22/2020 1416   BILITOT 0.4 11/22/2020 1416   BILITOT 0.3 01/22/2019 0915   GFRNONAA >60 11/22/2020 1416   GFRNONAA >60 01/22/2019  0915   GFRAA >60 04/26/2020 1323   GFRAA >60 01/22/2019 0915    Lab Results  Component Value Date   WBC 11.6 (H) 11/22/2020   NEUTROABS 10.7 (H) 11/22/2020   HGB 13.0 11/22/2020   HCT 38.4 11/22/2020   MCV 83.7 11/22/2020   PLT 241  11/22/2020   No results found for: LABCA2  No components found for: [id number]  No results for input(s): INR in the last 168 hours.  No results found for: LABCA2  No results found for: GXQ119  No results found for: ERD408  No results found for: XKG818  No results found for: CA2729  No components found for: HGQUANT  No results found for: CEA1 / No results found for: CEA1   No results found for: AFPTUMOR  No results found for: CHROMOGRNA  No results found for: TOTALPROTELP, ALBUMINELP, A1GS, A2GS, BETS, BETA2SER, GAMS, MSPIKE, SPEI (this displays SPEP labs)  No results found for: KPAFRELGTCHN, LAMBDASER, KAPLAMBRATIO (kappa/lambda light chains)  No results found for: HGBA, HGBA2QUANT, HGBFQUANT, HGBSQUAN (Hemoglobinopathy evaluation)   No results found for: LDH  No results found for: IRON, TIBC, IRONPCTSAT (Iron and TIBC)  No results found for: FERRITIN  Urinalysis    Component Value Date/Time   COLORURINE YELLOW 09/13/2011 0952   APPEARANCEUR CLEAR 09/13/2011 0952   LABSPEC 1.010 09/13/2011 0952   PHURINE 6.5 09/13/2011 0952   GLUCOSEU NEGATIVE 09/13/2011 0952   HGBUR NEGATIVE 09/13/2011 0952   HGBUR negative 04/03/2009 0949   BILIRUBINUR n 09/15/2012 0832   KETONESUR NEGATIVE 09/13/2011 0952   PROTEINUR n 09/15/2012 0832   PROTEINUR NEGATIVE 09/13/2011 0952   UROBILINOGEN 0.2 09/15/2012 0832   UROBILINOGEN 0.2 09/13/2011 0952   NITRITE n 09/15/2012 0832   NITRITE NEGATIVE 09/13/2011 0952   LEUKOCYTESUR Negative 09/15/2012 0832    STUDIES:  No results found.   ELIGIBLE FOR AVAILABLE RESEARCH PROTOCOL: no   ASSESSMENT: 65 y.o. Tiffany Montes woman status post left breast lower inner quadrant biopsy 07/31/2018 for a  clinical T1b N0, stage IA invasive ductal carcinoma, grade 3, triple positive, with an MIB-1 of 75%.  (1) status post left lumpectomy and sentinel lymph node sampling 09/07/2018 for a pT1c pN0, stage IA invasive ductal carcinoma, grade 3, with negative margins.  (a) a total of 5 sentinel lymph nodes were removed  (2) adjuvant chemotherapy consisting of paclitaxel weekly x12 with trastuzumab every 21 days starting 10/02/2018, completed 12/25/2018  (3) trastuzumab continued to complete 1 year (through December 2020).  (a) echocardiogram 08/17/2018 shows an ejection fraction of 60-65%  (b) echocardiogram on 11/17/2018 shows an ejection fraction of 60-65%  (c) echo 02/08/2019 shows an ejection fraction in the 60-65%  (d) Echo in 05/2019 shows EF of 60-65%  (e) Echo on 09/16/2019 shows EF of 60-65%  (4) adjuvant radiation 02/04/2019 - 03/04/2019  (a) Left breast treated to 42.56 Gy in 16 fractions  (b) Seroma boost of 8 Gy in 4 fractions, for a total of 50.56 Gy  (5) started anastrozole 04/11/2019, discontinued February 2022 with possible neuropathy  (a) bone density scan 11/05/2019 showed a T score of -0.3, which is normal  (b) tamoxifen started February 2022  PLAN: Clayborne Dana is now a little over 2 years out from definitive surgery for breast cancer with no evidence of disease recurrence.  This is very febrile.  She has generally done well with anastrozole but she is now developing what may well be peripheral neuropathy.  It is hard to tell because she also has some carpal tunnel on the right hand.  The symptoms on the left hand are very minimal.  She has no problems with her feet.  I think she is a good candidate for switching to tamoxifen.  She does not have a uterus.  She took hormone replacement for many years with no clotting  complications.  We discussed the possible toxicities side effects and complications of tamoxifen in detail.  She is interested in changing and I have entered the  appropriate orders.  She is on antidepressants and there is an interaction between them and tamoxifen however clinically that does not affect either the effectiveness of tamoxifen or the effectiveness of the antidepressants.  It is fine for her to continue on both her antidepressants as well as tamoxifen.  I encouraged her to do her best with the [facility] and wellness program.  She will return to see me in November.  She knows to call for any other issue that may develop before then  Total encounter time 25 minutes.Sarajane Jews C. Errika Narvaiz, MD 11/22/20 3:08 PM Medical Oncology and Hematology [facility] [place], [place] [address] Tel. [phone]    Fax. [phone]    I, Wilburn Mylar, am acting as scribe for Dr. Virgie Dad. Caitlynne Harbeck.  I, Lurline Del MD, have reviewed the above documentation for accuracy and completeness, and I agree with the above.    *Total Encounter Time as defined by the Centers for Medicare and Medicaid Services includes, in addition to the face-to-face time of a patient visit (documented in the note above) non-face-to-face time: obtaining and reviewing outside history, ordering and reviewing medications, tests or procedures, care coordination (communications with other health care professionals or caregivers) and documentation in the medical record.

## 2020-11-22 NOTE — Telephone Encounter (Signed)
Left message for pt in reference to new medication Dr Jana Hakim prescribed today (tamoxifen) and no contraindications with that medication and any of her current medications (Zoloft) . Pt to call if she has any questions.

## 2020-11-23 ENCOUNTER — Ambulatory Visit (INDEPENDENT_AMBULATORY_CARE_PROVIDER_SITE_OTHER): Payer: Medicare Other | Admitting: Family Medicine

## 2020-11-23 ENCOUNTER — Other Ambulatory Visit: Payer: Self-pay

## 2020-11-23 ENCOUNTER — Encounter (INDEPENDENT_AMBULATORY_CARE_PROVIDER_SITE_OTHER): Payer: Self-pay | Admitting: Family Medicine

## 2020-11-23 ENCOUNTER — Telehealth: Payer: Self-pay | Admitting: Oncology

## 2020-11-23 VITALS — BP 157/89 | HR 74 | Temp 98.0°F | Ht 63.0 in | Wt 214.0 lb

## 2020-11-23 DIAGNOSIS — Z6838 Body mass index (BMI) 38.0-38.9, adult: Secondary | ICD-10-CM | POA: Diagnosis not present

## 2020-11-23 DIAGNOSIS — G43809 Other migraine, not intractable, without status migrainosus: Secondary | ICD-10-CM

## 2020-11-23 DIAGNOSIS — E876 Hypokalemia: Secondary | ICD-10-CM

## 2020-11-23 DIAGNOSIS — Z1331 Encounter for screening for depression: Secondary | ICD-10-CM

## 2020-11-23 DIAGNOSIS — C50212 Malignant neoplasm of upper-inner quadrant of left female breast: Secondary | ICD-10-CM

## 2020-11-23 DIAGNOSIS — F908 Attention-deficit hyperactivity disorder, other type: Secondary | ICD-10-CM

## 2020-11-23 DIAGNOSIS — R0602 Shortness of breath: Secondary | ICD-10-CM | POA: Diagnosis not present

## 2020-11-23 DIAGNOSIS — R7303 Prediabetes: Secondary | ICD-10-CM

## 2020-11-23 DIAGNOSIS — Z0289 Encounter for other administrative examinations: Secondary | ICD-10-CM

## 2020-11-23 DIAGNOSIS — F3289 Other specified depressive episodes: Secondary | ICD-10-CM

## 2020-11-23 DIAGNOSIS — R5383 Other fatigue: Secondary | ICD-10-CM

## 2020-11-23 DIAGNOSIS — E7849 Other hyperlipidemia: Secondary | ICD-10-CM

## 2020-11-23 DIAGNOSIS — Z79899 Other long term (current) drug therapy: Secondary | ICD-10-CM

## 2020-11-23 DIAGNOSIS — G8929 Other chronic pain: Secondary | ICD-10-CM

## 2020-11-23 DIAGNOSIS — Z170 Estrogen receptor positive status [ER+]: Secondary | ICD-10-CM

## 2020-11-23 DIAGNOSIS — E65 Localized adiposity: Secondary | ICD-10-CM

## 2020-11-23 DIAGNOSIS — E038 Other specified hypothyroidism: Secondary | ICD-10-CM

## 2020-11-23 DIAGNOSIS — E559 Vitamin D deficiency, unspecified: Secondary | ICD-10-CM

## 2020-11-23 DIAGNOSIS — G6289 Other specified polyneuropathies: Secondary | ICD-10-CM

## 2020-11-23 DIAGNOSIS — M549 Dorsalgia, unspecified: Secondary | ICD-10-CM

## 2020-11-23 DIAGNOSIS — I10 Essential (primary) hypertension: Secondary | ICD-10-CM

## 2020-11-23 DIAGNOSIS — F988 Other specified behavioral and emotional disorders with onset usually occurring in childhood and adolescence: Secondary | ICD-10-CM

## 2020-11-23 NOTE — Telephone Encounter (Signed)
Scheduled appts per 2/16 los. Left voicemail with appt date and time.

## 2020-11-24 LAB — HEMOGLOBIN A1C
Est. average glucose Bld gHb Est-mCnc: 126 mg/dL
Hgb A1c MFr Bld: 6 % — ABNORMAL HIGH (ref 4.8–5.6)

## 2020-11-24 LAB — LIPID PANEL
Chol/HDL Ratio: 2.3 ratio (ref 0.0–4.4)
Cholesterol, Total: 158 mg/dL (ref 100–199)
HDL: 69 mg/dL (ref >39)
LDL Chol Calc (NIH): 77 mg/dL (ref 0–99)
Triglycerides: 56 mg/dL (ref 0–149)
VLDL Cholesterol Cal: 12 mg/dL (ref 5–40)

## 2020-11-24 LAB — T4, FREE: Free T4: 1.57 ng/dL (ref 0.82–1.77)

## 2020-11-24 LAB — VITAMIN D 25 HYDROXY (VIT D DEFICIENCY, FRACTURES): Vit D, 25-Hydroxy: 53.1 ng/mL (ref 30.0–100.0)

## 2020-11-24 LAB — FERRITIN: Ferritin: 48 ng/mL (ref 15–150)

## 2020-11-24 LAB — VITAMIN B12: Vitamin B-12: 471 pg/mL (ref 232–1245)

## 2020-11-24 LAB — TSH: TSH: 0.798 u[IU]/mL (ref 0.450–4.500)

## 2020-11-24 LAB — INSULIN, RANDOM: INSULIN: 31.2 u[IU]/mL — ABNORMAL HIGH (ref 2.6–24.9)

## 2020-11-27 ENCOUNTER — Ambulatory Visit (INDEPENDENT_AMBULATORY_CARE_PROVIDER_SITE_OTHER): Payer: Self-pay | Admitting: Family Medicine

## 2020-11-28 NOTE — Progress Notes (Signed)
Chief Complaint:   OBESITY Tiffany Montes (MR# [id number]) is a 65 y.o. female who presents for evaluation and treatment of obesity and related comorbidities. Current BMI is Body mass index is 37.91 kg/m. Devanee has been struggling with her weight for many years and has been unsuccessful in either losing weight, maintaining weight loss, or reaching her healthy weight goal.  Shaundra is currently in the action stage of change and ready to dedicate time achieving and maintaining a healthier weight. Emilee is interested in becoming our patient and working on intensive lifestyle modifications including (but not limited to) diet and exercise for weight loss.  Neema is currently in PT for her back.  She says it is helping with myalgias.   Jaslen's habits were reviewed today and are as follows: her desired weight loss is 68 pounds, she started gaining excessive weight over the last 10 years, her heaviest weight ever was 218 pounds, she craves desserts and sweets, she snacks frequently in the evenings, she wakes up sometimes in the middle of the night to eat, she skips lunch frequently, she is frequently drinking liquids with calories, she frequently makes poor food choices, she has problems with excessive hunger, she frequently eats larger portions than normal and she struggles with emotional eating.  Depression Screen Juli's Food and Mood (modified PHQ-9) score was 18.  Depression screen [facility] 2/9 11/23/2020  Decreased Interest 3  Down, Depressed, Hopeless 3  PHQ - 2 Score 6  Altered sleeping 2  Tired, decreased energy 3  Change in appetite 2  Feeling bad or failure about yourself  1  Trouble concentrating 2  Moving slowly or fidgety/restless 2  Suicidal thoughts 0  PHQ-9 Score 18  Difficult doing work/chores Somewhat difficult  Some recent data might be hidden   Assessment/Plan:   1. Other fatigue Tyriana admits to daytime somnolence and reports waking up still  tired. Patent has a history of symptoms of daytime fatigue, morning fatigue and morning headache. Cresta generally gets 6 or 7 hours of sleep per night, and states that she has poor quality sleep. Snoring is not present. Apneic episodes are not present. Epworth Sleepiness Score is 11.  Brady does feel that her weight is causing her energy to be lower than it should be. Fatigue may be related to obesity, depression or many other causes. Labs will be ordered, and in the meanwhile, Zelia will focus on self care including making healthy food choices, increasing physical activity and focusing on stress reduction.  - EKG 12-Lead - Vitamin B12 - Ferritin  2. SOB (shortness of breath) on exertion Shay notes increasing shortness of breath with exercising and seems to be worsening over time with weight gain. She notes getting out of breath sooner with activity than she used to. This has gotten worse recently. Trameka denies shortness of breath at rest or orthopnea.  Yaqueline does feel that she gets out of breath more easily that she used to when she exercises. Latania's shortness of breath appears to be obesity related and exercise induced. She has agreed to work on weight loss and gradually increase exercise to treat her exercise induced shortness of breath. Will continue to monitor closely.  - Vitamin B12 - Ferritin  3. Visceral obesity Current visceral fat rating: 16. Visceral fat rating should be < 13. Visceral adipose tissue is a hormonally active component of total body fat. This body composition phenotype is associated with medical disorders such as metabolic syndrome, cardiovascular disease and several  malignancies including prostate, breast, and colorectal cancers. Starting goal: Lose 7-10% of starting weight.   4. Prediabetes Not at goal. Goal is HgbA1c < 5.7.  Medication: None.    Plan:  She will continue to focus on protein-rich, low simple carbohydrate foods. We reviewed the  importance of hydration, regular exercise for stress reduction, and restorative sleep.   Lab Results  Component Value Date   HGBA1C 6.0 (H) 11/23/2020   Lab Results  Component Value Date   INSULIN 31.2 (H) 11/23/2020   - Hemoglobin A1c - Insulin, random  5. Essential hypertension Elevated.  Medications: losartan-HCTZ 100-12.5 mg daily.   Plan: Avoid buying foods that are: processed, frozen, or prepackaged to avoid excess salt. We will continue to monitor closely alongside her PCP and/or Specialist.  Regular follow up with PCP and specialists was also encouraged.   BP Readings from Last 3 Encounters:  11/23/20 (!) 157/89  11/22/20 (!) 144/93  09/18/20 118/80   Lab Results  Component Value Date   CREATININE 0.89 11/22/2020   6. Other hyperlipidemia Course: Controlled. Lipid-lowering medications: Crestor 20 mg daily.   Plan: Dietary changes: Increase soluble fiber, decrease simple carbohydrates, decrease saturated fat. Exercise changes: Moderate to vigorous-intensity aerobic activity 150 minutes per week or as tolerated. We will continue to monitor along with PCP/specialists as it pertains to her weight loss journey.  Lab Results  Component Value Date   CHOL 158 11/23/2020   HDL 69 11/23/2020   LDLCALC 77 11/23/2020   LDLDIRECT 136.7 09/15/2012   TRIG 56 11/23/2020   CHOLHDL 2.3 11/23/2020   Lab Results  Component Value Date   ALT 21 11/22/2020   AST 16 11/22/2020   ALKPHOS 85 11/22/2020   BILITOT 0.4 11/22/2020   The 10-year ASCVD risk score Mikey Bussing DC Jr., et al., 2013) is: 8.9%   Values used to calculate the score:     Age: 63 years     Sex: Female     Is Non-Hispanic African American: No     Diabetic: No     Tobacco smoker: No     Systolic Blood Pressure: 409 mmHg     Is BP treated: Yes     HDL Cholesterol: 69 mg/dL     Total Cholesterol: 158 mg/dL  - Lipid panel  7. Other specified hypothyroidism Course: Stable. Medication: Synthroid 150 mcg dialy.    Plan: Patient was instructed not to take MVM or iron within 4 hours of taking thyroid medications. This issue is managed by Dr. Elease Hashimoto. We will continue to monitor alongside Endocrinology/PCP as it relates to her weight loss journey.   Lab Results  Component Value Date   TSH 0.798 11/23/2020   - TSH - T4, free  8. Chronic back pain, unspecified back location, unspecified back pain laterality Shaniece has OA.  She is currently in PT for her back.  9. Other polyneuropathy She has been dropping items. Oncology recently changed to Tamoxifen.  She is taking gabapentin 300 mg nightly at bedtime.  Plan:  Will check vitamin B12 level today.  - Vitamin B12  10. Other migraine without status migrainosus, not intractable Roxsana says she experiences morning headaches.  Epworth score is 11.  11. Malignant neoplasm of upper-inner quadrant of left breast in female, estrogen receptor positive (HCC) Annalise is currently taking tamoxifen 20 mg daily.  12. Hypokalemia Due to diarrhea.  Will supplement.  Lab Results  Component Value Date   K 3.3 (L) 11/22/2020   13. Medication management  Will check labs today, as per below.  - VITAMIN D 25 Hydroxy (Vit-D Deficiency, Fractures) - Vitamin B12 - Ferritin  14. Vitamin D deficiency Optimal goal > 50 ng/dL.   Plan: Continue current OTC vitamin D supplementation.  Will check vitamin D level today, as per below.  - VITAMIN D 25 Hydroxy (Vit-D Deficiency, Fractures)  15. Attention deficit hyperactivity disorder (ADHD), other type Najwa is taking Adderall 10 mg daily for ADHD.  16. Other depression, with emotional eating Khalani eats when stressed, as a reward, and for comfort.  PHQ-9 is 18.  She takes Wellbutrin XL 300 mg daily and Zoloft 100 mg daily. Discussed cues and consequences, how thoughts affect eating, model of thoughts, feelings, and behaviors, and strategies for change by focusing on the cue. Discussed cognitive  distortions, coping thoughts, and how to change your thoughts.  17. Class 2 severe obesity with serious comorbidity and body mass index (BMI) of 38.0 to 38.9 in adult, unspecified obesity type [facility])  Darenda is currently in the action stage of change and her goal is to continue with weight loss efforts. I recommend Mekaela begin the structured treatment plan as follows:  She has agreed to the Category 2 Plan +100-200 calories.  Exercise goals: No exercise has been prescribed at this time.   Behavioral modification strategies: increasing lean protein intake, decreasing simple carbohydrates, increasing vegetables, increasing water intake, decreasing liquid calories, decreasing alcohol intake, decreasing sodium intake and increasing high fiber foods.  She was informed of the importance of frequent follow-up visits to maximize her success with intensive lifestyle modifications for her multiple health conditions. She was informed we would discuss her lab results at her next visit unless there is a critical issue that needs to be addressed sooner. Batool agreed to keep her next visit at the agreed upon time to discuss these results.  Objective:   Blood pressure (!) 157/89, pulse 74, temperature 98 F (36.7 C), temperature source Oral, height 5\' 3"  (1.6 m), weight 214 lb (97.1 kg), SpO2 96 %. Body mass index is 37.91 kg/m.  EKG: Normal sinus rhythm, rate 77 bpm.  Indirect Calorimeter completed today shows a VO2 of 276 and a REE of 1919.  Her calculated basal metabolic rate is 5809 thus her basal metabolic rate is better than expected.  General: Cooperative, alert, well developed, in no acute distress. HEENT: Conjunctivae and lids unremarkable. Cardiovascular: Regular rhythm.  Lungs: Normal work of breathing. Neurologic: No focal deficits.   Lab Results  Component Value Date   CREATININE 0.89 11/22/2020   BUN 14 11/22/2020   NA 138 11/22/2020   K 3.3 (L) 11/22/2020   CL 107 11/22/2020    CO2 20 (L) 11/22/2020   Lab Results  Component Value Date   ALT 21 11/22/2020   AST 16 11/22/2020   ALKPHOS 85 11/22/2020   BILITOT 0.4 11/22/2020   Lab Results  Component Value Date   HGBA1C 6.0 (H) 11/23/2020   HGBA1C 6.1 01/18/2020   HGBA1C 6.2 04/19/2019   HGBA1C 6.0 01/28/2018   Lab Results  Component Value Date   INSULIN 31.2 (H) 11/23/2020   Lab Results  Component Value Date   TSH 0.798 11/23/2020   Lab Results  Component Value Date   CHOL 158 11/23/2020   HDL 69 11/23/2020   LDLCALC 77 11/23/2020   LDLDIRECT 136.7 09/15/2012   TRIG 56 11/23/2020   CHOLHDL 2.3 11/23/2020   Lab Results  Component Value Date   WBC 11.6 (H) 11/22/2020  HGB 13.0 11/22/2020   HCT 38.4 11/22/2020   MCV 83.7 11/22/2020   PLT 241 11/22/2020   Lab Results  Component Value Date   FERRITIN 48 11/23/2020   Obesity Behavioral Intervention:   Approximately 15 minutes were spent on the discussion below.  ASK: We discussed the diagnosis of obesity with [name] today and Jeronica agreed to give [place] permission to discuss obesity behavioral modification therapy today.  ASSESS: Zurri has the diagnosis of obesity and her BMI today is 38.0. Kaylynn is in the action stage of change.   ADVISE: Annet was educated on the multiple health risks of obesity as well as the benefit of weight loss to improve her health. She was advised of the need for long term treatment and the importance of lifestyle modifications to improve her current health and to decrease her risk of future health problems.  AGREE: Multiple dietary modification options and treatment options were discussed and Daniele agreed to follow the recommendations documented in the above note.  ARRANGE: Zada was educated on the importance of frequent visits to treat obesity as outlined per CMS and USPSTF guidelines and agreed to schedule her next follow up appointment today.  Attestation Statements:   This is the  patient's first visit at [facility]. The patient's NEW PATIENT PACKET was reviewed at length. Included in the packet: current and past health history, medications, allergies, ROS, gynecologic history (women only), surgical history, family history, social history, weight history, weight loss surgery history (for those that have had weight loss surgery), nutritional evaluation, mood and food questionnaire, PHQ9, Epworth questionnaire, sleep habits questionnaire, patient life and health improvement goals questionnaire. These will all be scanned into the patient's chart under media.   During the visit, I independently reviewed the patient's EKG, bioimpedance scale results, and indirect calorimeter results. I used this information to tailor a meal plan for the patient that will help her to lose weight and will improve her obesity-related conditions going forward. I performed a medically necessary appropriate examination and/or evaluation. I discussed the assessment and treatment plan with the patient. The patient was provided an opportunity to ask questions and all were answered. The patient agreed with the plan and demonstrated an understanding of the instructions. Labs were ordered at this visit and will be reviewed at the next visit unless more critical results need to be addressed immediately. Clinical information was updated and documented in the EMR.   I, Water quality scientist, CMA, am acting as transcriptionist for Briscoe Deutscher, DO  I have reviewed the above documentation for accuracy and completeness, and I agree with the above. Briscoe Deutscher, DO

## 2020-12-07 ENCOUNTER — Encounter (INDEPENDENT_AMBULATORY_CARE_PROVIDER_SITE_OTHER): Payer: Self-pay | Admitting: Family Medicine

## 2020-12-07 ENCOUNTER — Other Ambulatory Visit: Payer: Self-pay

## 2020-12-07 ENCOUNTER — Ambulatory Visit (INDEPENDENT_AMBULATORY_CARE_PROVIDER_SITE_OTHER): Payer: Medicare Other | Admitting: Family Medicine

## 2020-12-07 VITALS — BP 123/84 | HR 86 | Temp 98.2°F | Ht 63.0 in | Wt 206.0 lb

## 2020-12-07 DIAGNOSIS — I10 Essential (primary) hypertension: Secondary | ICD-10-CM

## 2020-12-07 DIAGNOSIS — R7303 Prediabetes: Secondary | ICD-10-CM

## 2020-12-07 DIAGNOSIS — Z6836 Body mass index (BMI) 36.0-36.9, adult: Secondary | ICD-10-CM

## 2020-12-07 DIAGNOSIS — E782 Mixed hyperlipidemia: Secondary | ICD-10-CM | POA: Diagnosis not present

## 2020-12-07 DIAGNOSIS — E039 Hypothyroidism, unspecified: Secondary | ICD-10-CM | POA: Diagnosis not present

## 2020-12-11 ENCOUNTER — Encounter (INDEPENDENT_AMBULATORY_CARE_PROVIDER_SITE_OTHER): Payer: Self-pay

## 2020-12-11 MED ORDER — ACCU-CHEK AVIVA PLUS VI STRP: ORAL_STRIP | 0 refills | Status: DC

## 2020-12-11 MED ORDER — ACCU-CHEK AVIVA PLUS W/DEVICE KIT: PACK | 0 refills | Status: DC

## 2020-12-11 MED ORDER — ACCU-CHEK FASTCLIX LANCETS MISC: 0 refills | Status: DC

## 2020-12-11 NOTE — Progress Notes (Signed)
Chief Complaint:   OBESITY Tiffany Montes is here to discuss her progress with her obesity treatment plan along with follow-up of her obesity related diagnoses.   Today's visit was #: 2 Starting weight: 214 lbs Starting date: 11/23/2020 Today's weight: 206 lbs Today's date: 12/07/2020 Total lbs lost to date: 8 lbs Body mass index is 36.49 kg/m.  Total weight loss percentage to date: -3.74%  Interim History:  Evianna endorses hunger.  Sometimes skipped lunch, but ate more at dinner. Current Meal Plan: the Category 2 Plan +100-200 calories for 70% of the time.  Current Exercise Plan: Increased walking.  Assessment/Plan:   1. Prediabetes Not at goal. Goal is HgbA1c < 5.7.  Medication: None.    Plan:  She will continue to focus on protein-rich, low simple carbohydrate foods. We reviewed the importance of hydration, regular exercise for stress reduction, and restorative sleep.  Will send in glucometer and supplies to check blood sugar twice daily.   Lab Results  Component Value Date   HGBA1C 6.0 (H) 11/23/2020   Lab Results  Component Value Date   INSULIN 31.2 (H) 11/23/2020   - Blood Glucose Monitoring Suppl (ACCU-CHEK AVIVA PLUS) w/Device KIT; Use to check blood sugar BID, DX:R73.03  Dispense: 1 kit; Refill: 0 - glucose blood (ACCU-CHEK AVIVA PLUS) test strip; Use to check blood sugar BID, DX:R73.03  Dispense: 100 each; Refill: 0 - Accu-Chek FastClix Lancets MISC; Use to check blood sugar BID, DX:R73.03  Dispense: 100 each; Refill: 0  2. Essential hypertension At goal. Medications: losartan-HCTZ 100-12.5 mg daily.   Plan: Avoid buying foods that are: processed, frozen, or prepackaged to avoid excess salt. We will continue to monitor closely alongside her PCP and/or Specialist.  Regular follow up with PCP and specialists was also encouraged.   BP Readings from Last 3 Encounters:  12/07/20 123/84  11/23/20 (!) 157/89  11/22/20 (!) 144/93   Lab Results  Component Value Date    CREATININE 0.89 11/22/2020   3. Mixed hyperlipidemia Course: Controlled. Lipid-lowering medications: Crestor 20 mg daily.   Plan: Dietary changes: Increase soluble fiber, decrease simple carbohydrates, decrease saturated fat. Exercise changes: Moderate to vigorous-intensity aerobic activity 150 minutes per week or as tolerated. We will continue to monitor along with PCP/specialists as it pertains to her weight loss journey.  Lab Results  Component Value Date   CHOL 158 11/23/2020   HDL 69 11/23/2020   LDLCALC 77 11/23/2020   LDLDIRECT 136.7 09/15/2012   TRIG 56 11/23/2020   CHOLHDL 2.3 11/23/2020   Lab Results  Component Value Date   ALT 21 11/22/2020   AST 16 11/22/2020   ALKPHOS 85 11/22/2020   BILITOT 0.4 11/22/2020   The 10-year ASCVD risk score Mikey Bussing DC Jr., et al., 2013) is: 5.5%   Values used to calculate the score:     Age: 65 years     Sex: Female     Is Non-Hispanic African American: No     Diabetic: No     Tobacco smoker: No     Systolic Blood Pressure: 945 mmHg     Is BP treated: Yes     HDL Cholesterol: 69 mg/dL     Total Cholesterol: 158 mg/dL  4. Acquired hypothyroidism Course: Stable. Medication: Synthroid 150 mcg daily.   Plan: Patient was instructed not to take MVM or iron within 4 hours of taking thyroid medications. This issue is managed by Dr. Elease Hashimoto. We will continue to monitor alongside Endocrinology/PCP as it relates  to her weight loss journey.   Lab Results  Component Value Date   TSH 0.798 11/23/2020   5. Class 2 severe obesity with serious comorbidity and body mass index (BMI) of 36.0 to 36.9 in adult, unspecified obesity type [facility])  Course: Earnesteen is currently in the action stage of change. As such, her goal is to continue with weight loss efforts.   Nutrition goals: She has agreed to the Category 2 Plan +100-200 calories.   Exercise goals: As is.  Behavioral modification strategies: increasing lean protein intake, decreasing  simple carbohydrates, increasing vegetables, increasing water intake and decreasing liquid calories.  Tiffany Montes has agreed to follow-up with our clinic in 2 weeks. She was informed of the importance of frequent follow-up visits to maximize her success with intensive lifestyle modifications for her multiple health conditions.   Objective:   Blood pressure 123/84, pulse 86, temperature 98.2 F (36.8 C), temperature source Oral, height _0  (1.6 m), weight 206 lb (93.4 kg), SpO2 94 %. Body mass index is 36.49 kg/m.  General: Cooperative, alert, well developed, in no acute distress. HEENT: Conjunctivae and lids unremarkable. Cardiovascular: Regular rhythm.  Lungs: Normal work of breathing. Neurologic: No focal deficits.   Lab Results  Component Value Date   CREATININE 0.89 11/22/2020   BUN 14 11/22/2020   NA 138 11/22/2020   K 3.3 (L) 11/22/2020   CL 107 11/22/2020   CO2 20 (L) 11/22/2020   Lab Results  Component Value Date   ALT 21 11/22/2020   AST 16 11/22/2020   ALKPHOS 85 11/22/2020   BILITOT 0.4 11/22/2020   Lab Results  Component Value Date   HGBA1C 6.0 (H) 11/23/2020   HGBA1C 6.1 01/18/2020   HGBA1C 6.2 04/19/2019   HGBA1C 6.0 01/28/2018   Lab Results  Component Value Date   INSULIN 31.2 (H) 11/23/2020   Lab Results  Component Value Date   TSH 0.798 11/23/2020   Lab Results  Component Value Date   CHOL 158 11/23/2020   HDL 69 11/23/2020   LDLCALC 77 11/23/2020   LDLDIRECT 136.7 09/15/2012   TRIG 56 11/23/2020   CHOLHDL 2.3 11/23/2020   Lab Results  Component Value Date   WBC 11.6 (H) 11/22/2020   HGB 13.0 11/22/2020   HCT 38.4 11/22/2020   MCV 83.7 11/22/2020   PLT 241 11/22/2020   Lab Results  Component Value Date   FERRITIN 48 11/23/2020   Obesity Behavioral Intervention:   Approximately 15 minutes were spent on the discussion below.  ASK: We discussed the diagnosis of obesity with [name] today and Laynie agreed to give [place] permission  to discuss obesity behavioral modification therapy today.  ASSESS: Lyn has the diagnosis of obesity and her BMI today is 36.6. Jackson is in the action stage of change.   ADVISE: Io was educated on the multiple health risks of obesity as well as the benefit of weight loss to improve her health. She was advised of the need for long term treatment and the importance of lifestyle modifications to improve her current health and to decrease her risk of future health problems.  AGREE: Multiple dietary modification options and treatment options were discussed and Eshika agreed to follow the recommendations documented in the above note.  ARRANGE: Caylyn was educated on the importance of frequent visits to treat obesity as outlined per CMS and USPSTF guidelines and agreed to schedule her next follow up appointment today.  Attestation Statements:   Reviewed by clinician on day of visit:  allergies, medications, problem list, medical history, surgical history, family history, social history, and previous encounter notes.  I, Water quality scientist, CMA, am acting as transcriptionist for Briscoe Deutscher, DO  I have reviewed the above documentation for accuracy and completeness, and I agree with the above. Briscoe Deutscher, DO

## 2020-12-14 ENCOUNTER — Telehealth: Payer: Self-pay

## 2020-12-14 NOTE — Telephone Encounter (Signed)
Contacted pt to verify LTD information needed.

## 2020-12-18 ENCOUNTER — Telehealth: Payer: Self-pay

## 2020-12-18 NOTE — Telephone Encounter (Signed)
err

## 2020-12-19 ENCOUNTER — Other Ambulatory Visit: Payer: Self-pay

## 2020-12-19 ENCOUNTER — Encounter (INDEPENDENT_AMBULATORY_CARE_PROVIDER_SITE_OTHER): Payer: Self-pay | Admitting: Family Medicine

## 2020-12-19 ENCOUNTER — Ambulatory Visit (INDEPENDENT_AMBULATORY_CARE_PROVIDER_SITE_OTHER): Payer: Medicare Other | Admitting: Family Medicine

## 2020-12-19 VITALS — BP 132/83 | HR 87 | Temp 98.0°F | Ht 63.0 in | Wt 204.0 lb

## 2020-12-19 DIAGNOSIS — F3289 Other specified depressive episodes: Secondary | ICD-10-CM

## 2020-12-19 DIAGNOSIS — R5381 Other malaise: Secondary | ICD-10-CM

## 2020-12-19 DIAGNOSIS — Z6836 Body mass index (BMI) 36.0-36.9, adult: Secondary | ICD-10-CM

## 2020-12-19 DIAGNOSIS — R7303 Prediabetes: Secondary | ICD-10-CM | POA: Diagnosis not present

## 2020-12-19 DIAGNOSIS — I10 Essential (primary) hypertension: Secondary | ICD-10-CM | POA: Diagnosis not present

## 2020-12-19 DIAGNOSIS — R5383 Other fatigue: Secondary | ICD-10-CM

## 2020-12-26 NOTE — Progress Notes (Signed)
Chief Complaint:   OBESITY Cortny is here to discuss her progress with her obesity treatment plan along with follow-up of her obesity related diagnoses.   Today's visit was #: 3 Starting weight: 214 lbs Starting date: 11/23/2020 Today's weight: 204 lbs Today's date: 12/19/2020 Total lbs lost to date: 10 lbs Body mass index is 36.14 kg/m.  Total weight loss percentage to date: -4.67%  Interim History:  Videl says that her fatigue is worsening for 1 month.  Neuropathy is improving.  Blood glucose averages 115.  She has had increased urination at about every 2 hours and was diuresed about 10 pounds since her last visit.  Current Meal Plan: the Category 2 Plan +100-200 calories.  Current Exercise Plan: None.  Assessment/Plan:   1. Essential hypertension Not at goal. Medications: losartan-HCTZ 100-12.5 mg daily.   Plan:  Considering taking off HCTZ due to increased urination.  Avoid buying foods that are: processed, frozen, or prepackaged to avoid excess salt. We will continue to monitor closely alongside her PCP and/or Specialist.  Regular follow up with PCP and specialists was also encouraged.   BP Readings from Last 3 Encounters:  12/19/20 132/83  12/07/20 123/84  11/23/20 (!) 157/89   Lab Results  Component Value Date   CREATININE 0.89 11/22/2020   2. Malaise and fatigue Lonya endorses increased malaise and fatigue recently. We will continue to monitor symptoms as they relate to her weight loss journey.  3. Prediabetes Not at goal. Goal is HgbA1c < 5.7.  Medication: None.    Plan:  She will continue to focus on protein-rich, low simple carbohydrate foods. We reviewed the importance of hydration, regular exercise for stress reduction, and restorative sleep.   Lab Results  Component Value Date   HGBA1C 6.0 (H) 11/23/2020   Lab Results  Component Value Date   INSULIN 31.2 (H) 11/23/2020   4. Other depression, with emotional eating Controlled. Medication:  Wellbutrin XL 300 mg daily and Zoloft 100 mg daily.  Plan:  Continue medications.  Behavior modification techniques were discussed today to help deal with emotional/non-hunger eating behaviors.  5. Class 2 severe obesity with serious comorbidity and body mass index (BMI) of 36.0 to 36.9 in adult, unspecified obesity type [facility])  Course: Evanee is currently in the action stage of change. As such, her goal is to continue with weight loss efforts.   Nutrition goals: She has agreed to the Category 2 Plan +200 calories.   Exercise goals: Increase activity.  Behavioral modification strategies: increasing lean protein intake, decreasing simple carbohydrates, increasing vegetables and increasing water intake.  Kelia has agreed to follow-up with our clinic in 3 weeks. She was informed of the importance of frequent follow-up visits to maximize her success with intensive lifestyle modifications for her multiple health conditions.   Objective:   Blood pressure 132/83, pulse 87, temperature 98 F (36.7 C), temperature source Oral, height 5\' 3"  (1.6 m), weight 204 lb (92.5 kg), SpO2 95 %. Body mass index is 36.14 kg/m.  General: Cooperative, alert, well developed, in no acute distress. HEENT: Conjunctivae and lids unremarkable. Cardiovascular: Regular rhythm.  Lungs: Normal work of breathing. Neurologic: No focal deficits.   Lab Results  Component Value Date   CREATININE 0.89 11/22/2020   BUN 14 11/22/2020   NA 138 11/22/2020   K 3.3 (L) 11/22/2020   CL 107 11/22/2020   CO2 20 (L) 11/22/2020   Lab Results  Component Value Date   ALT 21 11/22/2020  AST 16 11/22/2020   ALKPHOS 85 11/22/2020   BILITOT 0.4 11/22/2020   Lab Results  Component Value Date   HGBA1C 6.0 (H) 11/23/2020   HGBA1C 6.1 01/18/2020   HGBA1C 6.2 04/19/2019   HGBA1C 6.0 01/28/2018   Lab Results  Component Value Date   INSULIN 31.2 (H) 11/23/2020   Lab Results  Component Value Date   TSH 0.798  11/23/2020   Lab Results  Component Value Date   CHOL 158 11/23/2020   HDL 69 11/23/2020   LDLCALC 77 11/23/2020   LDLDIRECT 136.7 09/15/2012   TRIG 56 11/23/2020   CHOLHDL 2.3 11/23/2020   Lab Results  Component Value Date   WBC 11.6 (H) 11/22/2020   HGB 13.0 11/22/2020   HCT 38.4 11/22/2020   MCV 83.7 11/22/2020   PLT 241 11/22/2020   Lab Results  Component Value Date   FERRITIN 48 11/23/2020   Obesity Behavioral Intervention:   Approximately 15 minutes were spent on the discussion below.  ASK: We discussed the diagnosis of obesity with [name] today and Tenzin agreed to give [place] permission to discuss obesity behavioral modification therapy today.  ASSESS: Camile has the diagnosis of obesity and her BMI today is 36.2. Atiyana is in the action stage of change.   ADVISE: Cambria was educated on the multiple health risks of obesity as well as the benefit of weight loss to improve her health. She was advised of the need for long term treatment and the importance of lifestyle modifications to improve her current health and to decrease her risk of future health problems.  AGREE: Multiple dietary modification options and treatment options were discussed and Allean agreed to follow the recommendations documented in the above note.  ARRANGE: Johanne was educated on the importance of frequent visits to treat obesity as outlined per CMS and USPSTF guidelines and agreed to schedule her next follow up appointment today.  Attestation Statements:   Reviewed by clinician on day of visit: allergies, medications, problem list, medical history, surgical history, family history, social history, and previous encounter notes.  I, Water quality scientist, CMA, am acting as transcriptionist for Briscoe Deutscher, DO  I have reviewed the above documentation for accuracy and completeness, and I agree with the above. Briscoe Deutscher, DO

## 2020-12-28 ENCOUNTER — Other Ambulatory Visit: Payer: Self-pay | Admitting: Family Medicine

## 2021-01-01 ENCOUNTER — Encounter (INDEPENDENT_AMBULATORY_CARE_PROVIDER_SITE_OTHER): Payer: Self-pay | Admitting: Family Medicine

## 2021-01-01 ENCOUNTER — Ambulatory Visit (INDEPENDENT_AMBULATORY_CARE_PROVIDER_SITE_OTHER): Payer: Medicare Other | Admitting: Family Medicine

## 2021-01-01 ENCOUNTER — Other Ambulatory Visit: Payer: Self-pay

## 2021-01-01 VITALS — BP 108/70 | HR 88 | Temp 97.8°F | Ht 63.0 in | Wt 201.0 lb

## 2021-01-01 DIAGNOSIS — F3289 Other specified depressive episodes: Secondary | ICD-10-CM

## 2021-01-01 DIAGNOSIS — E039 Hypothyroidism, unspecified: Secondary | ICD-10-CM | POA: Diagnosis not present

## 2021-01-01 DIAGNOSIS — E8881 Metabolic syndrome: Secondary | ICD-10-CM | POA: Diagnosis not present

## 2021-01-01 DIAGNOSIS — Z853 Personal history of malignant neoplasm of breast: Secondary | ICD-10-CM | POA: Diagnosis not present

## 2021-01-01 DIAGNOSIS — Z6837 Body mass index (BMI) 37.0-37.9, adult: Secondary | ICD-10-CM | POA: Diagnosis not present

## 2021-01-01 DIAGNOSIS — I10 Essential (primary) hypertension: Secondary | ICD-10-CM

## 2021-01-01 DIAGNOSIS — E66812 Obesity, class 2: Secondary | ICD-10-CM

## 2021-01-02 DIAGNOSIS — F32A Depression, unspecified: Secondary | ICD-10-CM | POA: Insufficient documentation

## 2021-01-02 DIAGNOSIS — E8881 Metabolic syndrome: Secondary | ICD-10-CM | POA: Insufficient documentation

## 2021-01-02 NOTE — Progress Notes (Signed)
Chief Complaint:   OBESITY Tiffany Montes is here to discuss her progress with her obesity treatment plan along with follow-up of her obesity related diagnoses.   Today's visit was #: 4 Starting weight: 214 lbs Starting date: 11/23/2020 Today's weight: 201 lbs Today's date: 01/01/2021 Total lbs lost to date: 13 lbs Body mass index is 35.61 kg/m.  Total weight loss percentage to date: -6.07%  Interim History:  Tiffany Montes says she is tired of chicken.  She has been eating more seafood.  She worked in the yard, picking up sticks for 3 hours last week.  She stopped due to low blood sugar. Current Meal Plan: the Category 2 Plan +200 for 70-75% of the time.  Current Exercise Plan: Increased activity (see HPI).  Assessment/Plan:   1. Essential hypertension Lower today.  Medications: Hyzaar 100-12.5 mg daily.   Plan: Avoid buying foods that are: processed, frozen, or prepackaged to avoid excess salt. We will watch for signs of hypotension as she continues lifestyle modifications. We will continue to monitor closely alongside her PCP and/or Specialist.  Regular follow up with PCP and specialists was also encouraged.  Monitor blood pressure.  Okay to cut medication in half if blood pressure drops too low.  BP Readings from Last 3 Encounters:  01/01/21 108/70  12/19/20 132/83  12/07/20 123/84   Lab Results  Component Value Date   CREATININE 0.89 11/22/2020   2. Metabolic syndrome Starting goal: Lose 7-10% of starting weight. She will continue to focus on protein-rich, low simple carbohydrate foods. We reviewed the importance of hydration, regular exercise for stress reduction, and restorative sleep.  We will continue to check lab work every 3 months, with 10% weight loss, or should any other concerns arise.  Tiffany Montes has prediabetes, hyperlipidemia, and hypertension.  3. History of breast cancer Tiffany Montes is taking tamoxifen 20 mg daily and gabapentin 300 mg at bedtime.  Plan:  Try  decreasing gabapentin to 100-200 mg.  4. Acquired hypothyroidism Course: Stable. Medication: Synthroid 150 mcg daily.   Plan: Patient was instructed not to take MVM or iron within 4 hours of taking thyroid medications. This issue is managed by Dr. Elease Hashimoto. We will continue to monitor alongside Endocrinology/PCP as it relates to her weight loss journey.   Lab Results  Component Value Date   TSH 0.798 11/23/2020   5. Other depression, with emotional eating Controlled. Medication: Wellbutrin XL 300 mg daily and Zoloft 100 mg daily.  Plan:  Continue medications.  Behavior modification techniques were discussed today to help deal with emotional/non-hunger eating behaviors.  6. Obesity, current BMI 35.7  Course: Tiffany Montes is currently in the action stage of change. As such, her goal is to continue with weight loss efforts.   Nutrition goals: She has agreed to the Category 2 Plan +200 calories.   Exercise goals: As is.  Behavioral modification strategies: increasing lean protein intake, decreasing simple carbohydrates, increasing vegetables and increasing water intake.  Tiffany Montes has agreed to follow-up with our clinic in 3 weeks. She was informed of the importance of frequent follow-up visits to maximize her success with intensive lifestyle modifications for her multiple health conditions.   Objective:   Blood pressure 108/70, pulse 88, temperature 97.8 F (36.6 C), temperature source Oral, height 5\' 3"  (1.6 m), weight 201 lb (91.2 kg), SpO2 93 %. Body mass index is 35.61 kg/m.  General: Cooperative, alert, well developed, in no acute distress. HEENT: Conjunctivae and lids unremarkable. Cardiovascular: Regular rhythm.  Lungs: Normal work of  breathing. Neurologic: No focal deficits.   Lab Results  Component Value Date   CREATININE 0.89 11/22/2020   BUN 14 11/22/2020   NA 138 11/22/2020   K 3.3 (L) 11/22/2020   CL 107 11/22/2020   CO2 20 (L) 11/22/2020   Lab Results   Component Value Date   ALT 21 11/22/2020   AST 16 11/22/2020   ALKPHOS 85 11/22/2020   BILITOT 0.4 11/22/2020   Lab Results  Component Value Date   HGBA1C 6.0 (H) 11/23/2020   HGBA1C 6.1 01/18/2020   HGBA1C 6.2 04/19/2019   HGBA1C 6.0 01/28/2018   Lab Results  Component Value Date   INSULIN 31.2 (H) 11/23/2020   Lab Results  Component Value Date   TSH 0.798 11/23/2020   Lab Results  Component Value Date   CHOL 158 11/23/2020   HDL 69 11/23/2020   LDLCALC 77 11/23/2020   LDLDIRECT 136.7 09/15/2012   TRIG 56 11/23/2020   CHOLHDL 2.3 11/23/2020   Lab Results  Component Value Date   WBC 11.6 (H) 11/22/2020   HGB 13.0 11/22/2020   HCT 38.4 11/22/2020   MCV 83.7 11/22/2020   PLT 241 11/22/2020   Lab Results  Component Value Date   FERRITIN 48 11/23/2020   Obesity Behavioral Intervention:   Approximately 15 minutes were spent on the discussion below.  ASK: We discussed the diagnosis of obesity with Tiffany Montes today and Tiffany Montes agreed to give [place] permission to discuss obesity behavioral modification therapy today.  ASSESS: Tiffany Montes has the diagnosis of obesity and her BMI today is 35.7. Tiffany Montes is in the action stage of change.   ADVISE: Tiffany Montes was educated on the multiple health risks of obesity as well as the benefit of weight loss to improve her health. She was advised of the need for long term treatment and the importance of lifestyle modifications to improve her current health and to decrease her risk of future health problems.  AGREE: Multiple dietary modification options and treatment options were discussed and Tiffany Montes agreed to follow the recommendations documented in the above note.  ARRANGE: Tiffany Montes was educated on the importance of frequent visits to treat obesity as outlined per CMS and USPSTF guidelines and agreed to schedule her next follow up appointment today.  Attestation Statements:   Reviewed by clinician on day of visit: allergies,  medications, problem list, medical history, surgical history, family history, social history, and previous encounter notes.  I, Water quality scientist, CMA, am acting as transcriptionist for Briscoe Deutscher, DO  I have reviewed the above documentation for accuracy and completeness, and I agree with the above. Briscoe Deutscher, DO

## 2021-01-10 ENCOUNTER — Other Ambulatory Visit: Payer: Self-pay | Admitting: Family Medicine

## 2021-01-23 ENCOUNTER — Telehealth: Payer: Self-pay | Admitting: Family Medicine

## 2021-01-23 MED ORDER — AMPHETAMINE-DEXTROAMPHET ER 30 MG PO CP24: ORAL_CAPSULE | ORAL | 0 refills | Status: DC

## 2021-01-23 MED ORDER — AMPHETAMINE-DEXTROAMPHET ER 30 MG PO CP24: 30.0000 mg | ORAL_CAPSULE | ORAL | 0 refills | Status: DC

## 2021-01-23 NOTE — Telephone Encounter (Signed)
Last filled 10/26/2020 Last OV 09/18/2020  Ok to fill?

## 2021-01-23 NOTE — Telephone Encounter (Signed)
Patient is calling and requesting a refill for amphetamine-dextroamphetamine (ADDERALL XR) 30 MG 24 hr capsule to be sent to   [id number] [address], [place] [name] [address]  Phone:  [phone] Fax:  [phone]  CB is [phone]

## 2021-01-28 ENCOUNTER — Other Ambulatory Visit (INDEPENDENT_AMBULATORY_CARE_PROVIDER_SITE_OTHER): Payer: Self-pay | Admitting: Family Medicine

## 2021-01-28 DIAGNOSIS — R7303 Prediabetes: Secondary | ICD-10-CM

## 2021-01-29 ENCOUNTER — Ambulatory Visit (INDEPENDENT_AMBULATORY_CARE_PROVIDER_SITE_OTHER): Payer: Medicare Other | Admitting: Family Medicine

## 2021-01-29 ENCOUNTER — Other Ambulatory Visit: Payer: Self-pay

## 2021-01-29 ENCOUNTER — Encounter (INDEPENDENT_AMBULATORY_CARE_PROVIDER_SITE_OTHER): Payer: Self-pay | Admitting: Family Medicine

## 2021-01-29 VITALS — BP 120/81 | HR 75 | Temp 97.3°F | Ht 63.0 in | Wt 200.0 lb

## 2021-01-29 DIAGNOSIS — Z853 Personal history of malignant neoplasm of breast: Secondary | ICD-10-CM | POA: Diagnosis not present

## 2021-01-29 DIAGNOSIS — Z6837 Body mass index (BMI) 37.0-37.9, adult: Secondary | ICD-10-CM | POA: Diagnosis not present

## 2021-01-29 DIAGNOSIS — E038 Other specified hypothyroidism: Secondary | ICD-10-CM | POA: Diagnosis not present

## 2021-01-29 DIAGNOSIS — I10 Essential (primary) hypertension: Secondary | ICD-10-CM

## 2021-01-29 NOTE — Telephone Encounter (Signed)
Dr.Wallace °

## 2021-01-30 NOTE — Progress Notes (Signed)
Chief Complaint:   OBESITY Tiffany Montes is here to discuss her progress with her obesity treatment plan along with follow-up of her obesity related diagnoses.   Today's visit was #: 5 Starting weight: 214 lbs Starting date: 11/23/2020 Today's weight: 200 lbs Today's date: 01/29/2021 Total lbs lost to date: 14 lbs Body mass index is 35.43 kg/m.  Total weight loss percentage to date: -6.54%  Interim History:  Makilah has cut her blood pressure medication in half.  She has history of breast cancer and has decreased her gabapentin.  She says she has been eating a lot of fish and chicken.  She reports that she has been eating jelly beans.  Current Meal Plan: the Category 2 Plan +200 calories.  Current Exercise Plan: Walking for 30 minutes 3-4 times per week.  Assessment/Plan:   1. Essential hypertension At goal. Medications: Hyzaar 100-12.5 mg daily (currently taking 1/2 tablet).   Plan: Avoid buying foods that are: processed, frozen, or prepackaged to avoid excess salt. We will watch for signs of hypotension as she continues lifestyle modifications. We will continue to monitor closely alongside her PCP and/or Specialist.  Regular follow up with PCP and specialists was also encouraged.   BP Readings from Last 3 Encounters:  01/29/21 120/81  01/01/21 108/70  12/19/20 132/83   Lab Results  Component Value Date   CREATININE 0.89 11/22/2020   2. History of breast cancer Chairty has history of breast cancer.  She is taking gabapentin.  She is followed by Oncology.  3. Other specified hypothyroidism Course: Stable. Medication: Synthroid 150 mcg daily.   Plan: Patient was instructed not to take MVM or iron within 4 hours of taking thyroid medications. This issue is managed by Dr. Elease Hashimoto. We will continue to monitor alongside Endocrinology/PCP as it relates to her weight loss journey.   Lab Results  Component Value Date   TSH 0.798 11/23/2020   4. Obesity, current BMI  35.5  Course: Blakelynn is currently in the action stage of change. As such, her goal is to continue with weight loss efforts.   Nutrition goals: She has agreed to the Category 2 Plan +200 calories.   Exercise goals: For substantial health benefits, adults should do at least 150 minutes (2 hours and 30 minutes) a week of moderate-intensity, or 75 minutes (1 hour and 15 minutes) a week of vigorous-intensity aerobic physical activity, or an equivalent combination of moderate- and vigorous-intensity aerobic activity. Aerobic activity should be performed in episodes of at least 10 minutes, and preferably, it should be spread throughout the week.  Behavioral modification strategies: increasing lean protein intake, decreasing simple carbohydrates, increasing vegetables and increasing water intake.  Arnetta has agreed to follow-up with our clinic in 2-3 weeks. She was informed of the importance of frequent follow-up visits to maximize her success with intensive lifestyle modifications for her multiple health conditions.   Objective:   Blood pressure 120/81, pulse 75, temperature (!) 97.3 F (36.3 C), temperature source Oral, height 5\' 3"  (1.6 m), weight 200 lb (90.7 kg), SpO2 95 %. Body mass index is 35.43 kg/m.  General: Cooperative, alert, well developed, in no acute distress. HEENT: Conjunctivae and lids unremarkable. Cardiovascular: Regular rhythm.  Lungs: Normal work of breathing. Neurologic: No focal deficits.   Lab Results  Component Value Date   CREATININE 0.89 11/22/2020   BUN 14 11/22/2020   NA 138 11/22/2020   K 3.3 (L) 11/22/2020   CL 107 11/22/2020   CO2 20 (L) 11/22/2020  Lab Results  Component Value Date   ALT 21 11/22/2020   AST 16 11/22/2020   ALKPHOS 85 11/22/2020   BILITOT 0.4 11/22/2020   Lab Results  Component Value Date   HGBA1C 6.0 (H) 11/23/2020   HGBA1C 6.1 01/18/2020   HGBA1C 6.2 04/19/2019   HGBA1C 6.0 01/28/2018   Lab Results  Component Value  Date   INSULIN 31.2 (H) 11/23/2020   Lab Results  Component Value Date   TSH 0.798 11/23/2020   Lab Results  Component Value Date   CHOL 158 11/23/2020   HDL 69 11/23/2020   LDLCALC 77 11/23/2020   LDLDIRECT 136.7 09/15/2012   TRIG 56 11/23/2020   CHOLHDL 2.3 11/23/2020   Lab Results  Component Value Date   WBC 11.6 (H) 11/22/2020   HGB 13.0 11/22/2020   HCT 38.4 11/22/2020   MCV 83.7 11/22/2020   PLT 241 11/22/2020   Lab Results  Component Value Date   FERRITIN 48 11/23/2020   Obesity Behavioral Intervention:   Approximately 15 minutes were spent on the discussion below.  ASK: We discussed the diagnosis of obesity with [name] today and Marcayla agreed to give [place] permission to discuss obesity behavioral modification therapy today.  ASSESS: Teria has the diagnosis of obesity and her BMI today is 35.5. Jonita is in the action stage of change.   ADVISE: Sharlena was educated on the multiple health risks of obesity as well as the benefit of weight loss to improve her health. She was advised of the need for long term treatment and the importance of lifestyle modifications to improve her current health and to decrease her risk of future health problems.  AGREE: Multiple dietary modification options and treatment options were discussed and Ailani agreed to follow the recommendations documented in the above note.  ARRANGE: Tyniya was educated on the importance of frequent visits to treat obesity as outlined per CMS and USPSTF guidelines and agreed to schedule her next follow up appointment today.  Attestation Statements:   Reviewed by clinician on day of visit: allergies, medications, problem list, medical history, surgical history, family history, social history, and previous encounter notes.  I, Water quality scientist, CMA, am acting as transcriptionist for Briscoe Deutscher, DO  I have reviewed the above documentation for accuracy and completeness, and I agree with the  above. Briscoe Deutscher, DO

## 2021-02-12 ENCOUNTER — Encounter (INDEPENDENT_AMBULATORY_CARE_PROVIDER_SITE_OTHER): Payer: Self-pay | Admitting: Family Medicine

## 2021-02-12 ENCOUNTER — Ambulatory Visit (INDEPENDENT_AMBULATORY_CARE_PROVIDER_SITE_OTHER): Payer: Medicare Other | Admitting: Family Medicine

## 2021-02-12 ENCOUNTER — Other Ambulatory Visit: Payer: Self-pay

## 2021-02-12 VITALS — BP 122/87 | HR 78 | Temp 98.1°F | Ht 63.0 in | Wt 198.0 lb

## 2021-02-12 DIAGNOSIS — I10 Essential (primary) hypertension: Secondary | ICD-10-CM

## 2021-02-12 DIAGNOSIS — E782 Mixed hyperlipidemia: Secondary | ICD-10-CM

## 2021-02-12 DIAGNOSIS — F988 Other specified behavioral and emotional disorders with onset usually occurring in childhood and adolescence: Secondary | ICD-10-CM | POA: Diagnosis not present

## 2021-02-12 DIAGNOSIS — F5104 Psychophysiologic insomnia: Secondary | ICD-10-CM

## 2021-02-12 DIAGNOSIS — Z6837 Body mass index (BMI) 37.0-37.9, adult: Secondary | ICD-10-CM | POA: Diagnosis not present

## 2021-02-19 NOTE — Progress Notes (Signed)
Chief Complaint:   OBESITY Tiffany Montes is here to discuss her progress with her obesity treatment plan along with follow-up of her obesity related diagnoses.   Today's visit was #: 6 Starting weight: 214 lbs Starting date: 11/23/2020 Today's weight: 198 lbs Today's date: 02/12/2021 Weight change since last visit: 2 lbs Total lbs lost to date: 16 lbs Body mass index is 35.07 kg/m.  Total weight loss percentage to date: -7.48%  Interim History:  [name] says she enjoyed Mother's Day and the weekend.  She made good choices on other days.  She has increased her activity.  Current Meal Plan: the Category 2 Plan +200 calories for a small percentage of the time.  Current Exercise Plan: Increased activity.  Assessment/Plan:   1. Mixed hyperlipidemia Course: Controlled. Lipid-lowering medications: Crestor 20 mg daily.   Plan: Dietary changes: Increase soluble fiber, decrease simple carbohydrates, decrease saturated fat. Exercise changes: Moderate to vigorous-intensity aerobic activity 150 minutes per week or as tolerated. We will continue to monitor along with PCP/specialists as it pertains to her weight loss journey.  Lab Results  Component Value Date   CHOL 158 11/23/2020   HDL 69 11/23/2020   LDLCALC 77 11/23/2020   LDLDIRECT 136.7 09/15/2012   TRIG 56 11/23/2020   CHOLHDL 2.3 11/23/2020   Lab Results  Component Value Date   ALT 21 11/22/2020   AST 16 11/22/2020   ALKPHOS 85 11/22/2020   BILITOT 0.4 11/22/2020   The 10-year ASCVD risk score Mikey Bussing DC Jr., et al., 2013) is: 6.2%   Values used to calculate the score:     Age: 45 years     Sex: Female     Is Non-Hispanic African American: No     Diabetic: No     Tobacco smoker: No     Systolic Blood Pressure: 242 mmHg     Is BP treated: Yes     HDL Cholesterol: 69 mg/dL     Total Cholesterol: 158 mg/dL  2. Essential hypertension At goal. Medications: Hyzaar 100-12.5 mg daily.   Plan: Avoid buying foods that are:  processed, frozen, or prepackaged to avoid excess salt. We will watch for signs of hypotension as she continues lifestyle modifications.  BP Readings from Last 3 Encounters:  02/12/21 122/87  01/29/21 120/81  01/01/21 108/70   Lab Results  Component Value Date   CREATININE 0.89 11/22/2020   3. Psychophysiological insomnia This is poorly controlled. Current treatment: None.  Plan: Recommend sleep hygiene measures including regular sleep schedule, optimal sleep environment, and relaxing presleep rituals.   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  Plan:  Continue Adderall.  5. Obesity, current BMI 35.2  Course: Tiffany Montes is currently in the action stage of change. As such, her goal is to continue with weight loss efforts.   Nutrition goals: She has agreed to the Category 2 Plan +200 calories.   Exercise goals: For substantial health benefits, adults should do at least 150 minutes (2 hours and 30 minutes) a week of moderate-intensity, or 75 minutes (1 hour and 15 minutes) a week of vigorous-intensity aerobic physical activity, or an equivalent combination of moderate- and vigorous-intensity aerobic activity. Aerobic activity should be performed in episodes of at least 10 minutes, and preferably, it should be spread throughout the week.  Behavioral modification strategies: increasing lean protein intake, decreasing simple carbohydrates, increasing vegetables and increasing water intake.  Tiffany Montes has agreed to follow-up with our  clinic in 2 weeks. She was informed of the importance of frequent follow-up visits to maximize her success with intensive lifestyle modifications for her multiple health conditions.   Objective:   Blood pressure 122/87, pulse 78, temperature 98.1 F (36.7 C), temperature source Oral, height 5\' 3"  (1.6 m), weight 198 lb (89.8 kg), SpO2 95 %. Body mass index is 35.07 kg/m.  General:  Cooperative, alert, well developed, in no acute distress. HEENT: Conjunctivae and lids unremarkable. Cardiovascular: Regular rhythm.  Lungs: Normal work of breathing. Neurologic: No focal deficits.   Lab Results  Component Value Date   CREATININE 0.89 11/22/2020   BUN 14 11/22/2020   NA 138 11/22/2020   K 3.3 (L) 11/22/2020   CL 107 11/22/2020   CO2 20 (L) 11/22/2020   Lab Results  Component Value Date   ALT 21 11/22/2020   AST 16 11/22/2020   ALKPHOS 85 11/22/2020   BILITOT 0.4 11/22/2020   Lab Results  Component Value Date   HGBA1C 6.0 (H) 11/23/2020   HGBA1C 6.1 01/18/2020   HGBA1C 6.2 04/19/2019   HGBA1C 6.0 01/28/2018   Lab Results  Component Value Date   INSULIN 31.2 (H) 11/23/2020   Lab Results  Component Value Date   TSH 0.798 11/23/2020   Lab Results  Component Value Date   CHOL 158 11/23/2020   HDL 69 11/23/2020   LDLCALC 77 11/23/2020   LDLDIRECT 136.7 09/15/2012   TRIG 56 11/23/2020   CHOLHDL 2.3 11/23/2020   Lab Results  Component Value Date   WBC 11.6 (H) 11/22/2020   HGB 13.0 11/22/2020   HCT 38.4 11/22/2020   MCV 83.7 11/22/2020   PLT 241 11/22/2020   Lab Results  Component Value Date   FERRITIN 48 11/23/2020   Obesity Behavioral Intervention:   Approximately 15 minutes were spent on the discussion below.  ASK: We discussed the diagnosis of obesity with Tiffany Montes today and Tiffany Montes agreed to give [place] permission to discuss obesity behavioral modification therapy today.  ASSESS: Tiffany Montes has the diagnosis of obesity and her BMI today is 35.2. Tiffany Montes is in the action stage of change.   ADVISE: Tiffany Montes was educated on the multiple health risks of obesity as well as the benefit of weight loss to improve her health. She was advised of the need for long term treatment and the importance of lifestyle modifications to improve her current health and to decrease her risk of future health problems.  AGREE: Multiple dietary modification  options and treatment options were discussed and Tiffany Montes agreed to follow the recommendations documented in the above note.  ARRANGE: Tiffany Montes was educated on the importance of frequent visits to treat obesity as outlined per CMS and USPSTF guidelines and agreed to schedule her next follow up appointment today.  Attestation Statements:   Reviewed by clinician on day of visit: allergies, medications, problem list, medical history, surgical history, family history, social history, and previous encounter notes.  I, Water quality scientist, CMA, am acting as transcriptionist for Briscoe Deutscher, DO  I have reviewed the above documentation for accuracy and completeness, and I agree with the above. Briscoe Deutscher, DO

## 2021-02-25 ENCOUNTER — Other Ambulatory Visit: Payer: Self-pay | Admitting: Family Medicine

## 2021-02-28 ENCOUNTER — Other Ambulatory Visit: Payer: Self-pay

## 2021-02-28 ENCOUNTER — Encounter (INDEPENDENT_AMBULATORY_CARE_PROVIDER_SITE_OTHER): Payer: Self-pay | Admitting: Family Medicine

## 2021-02-28 ENCOUNTER — Ambulatory Visit (INDEPENDENT_AMBULATORY_CARE_PROVIDER_SITE_OTHER): Payer: Medicare Other | Admitting: Family Medicine

## 2021-02-28 ENCOUNTER — Encounter: Payer: Self-pay | Admitting: Oncology

## 2021-02-28 VITALS — BP 123/78 | HR 75 | Temp 97.5°F | Ht 63.0 in | Wt 197.0 lb

## 2021-02-28 DIAGNOSIS — E038 Other specified hypothyroidism: Secondary | ICD-10-CM

## 2021-02-28 DIAGNOSIS — R7303 Prediabetes: Secondary | ICD-10-CM | POA: Diagnosis not present

## 2021-02-28 DIAGNOSIS — Z6838 Body mass index (BMI) 38.0-38.9, adult: Secondary | ICD-10-CM

## 2021-03-03 ENCOUNTER — Other Ambulatory Visit (INDEPENDENT_AMBULATORY_CARE_PROVIDER_SITE_OTHER): Payer: Self-pay | Admitting: Family Medicine

## 2021-03-03 DIAGNOSIS — R7303 Prediabetes: Secondary | ICD-10-CM

## 2021-03-06 NOTE — Progress Notes (Signed)
Chief Complaint:   OBESITY Tiffany Montes is here to discuss her progress with her obesity treatment plan along with follow-up of her obesity related diagnoses. Tiffany Montes is on the Category 2 Plan + 200 calories and states she is following her eating plan approximately 20-30% of the time. Tiffany Montes states she is not currently exercising.  Today's visit was #: 7 Starting weight: 214 lbs Starting date: 11/23/2020 Today's weight: 197 lbs Today's date: 02/28/2021 Total lbs lost to date: 17 lbs Total lbs lost since last in-office visit: 1  Interim History: Tiffany Montes went to visit her friend at [facility] and so she ate somewhat indulgently. She is tired and is moving stuff around the house to prep for painting. She wants to get back to category 2. She just has quite a bit of life demands.  Subjective:   1. Pre-diabetes A1c 6.0 and insulin level 31.2. Tiffany Montes is not on medication. She has some propensity for carbs.  2. Other specified hypothyroidism Tiffany Montes is on levothyroxine 150 mcg daily. TSH and T4 WNL.  Assessment/Plan:   1. Pre-diabetes Tiffany Montes will continue to work on weight loss, exercise, and decreasing simple carbohydrates to help decrease the risk of diabetes.  -Repeat labs in July.  2. Other specified hypothyroidism Patient with long-standing hypothyroidism, on levothyroxine therapy. She appears euthyroid. Orders and follow up as documented in patient record. -Continue levothyroxine with no change in dosage.  Counseling Good thyroid control is important for overall health. Supratherapeutic thyroid levels are dangerous and will not improve weight loss results. The correct way to take levothyroxine is fasting, with water, separated by at least 30 minutes from breakfast, and separated by more than 4 hours from calcium, iron, multivitamins, acid reflux medications (PPIs).   3. Class 2 severe obesity with serious comorbidity and body mass index (BMI) of 38.0 to 38.9 in  adult, unspecified obesity type [facility]) Tiffany Montes is currently in the action stage of change. As such, her goal is to continue with weight loss efforts. She has agreed to the Category 2 Plan + 200 calories.   Exercise goals:  As is  Behavioral modification strategies: increasing lean protein intake, meal planning and cooking strategies, keeping healthy foods in the home and planning for success.  Tiffany Montes has agreed to follow-up with our clinic in 15 weeks. She was informed of the importance of frequent follow-up visits to maximize her success with intensive lifestyle modifications for her multiple health conditions.   Objective:   Blood pressure 123/78, pulse 75, temperature (!) 97.5 F (36.4 C), height 5\' 3"  (1.6 m), weight 197 lb (89.4 kg), SpO2 99 %. Body mass index is 34.9 kg/m.  General: Cooperative, alert, well developed, in no acute distress. HEENT: Conjunctivae and lids unremarkable. Cardiovascular: Regular rhythm.  Lungs: Normal work of breathing. Neurologic: No focal deficits.   Lab Results  Component Value Date   CREATININE 0.89 11/22/2020   BUN 14 11/22/2020   NA 138 11/22/2020   K 3.3 (L) 11/22/2020   CL 107 11/22/2020   CO2 20 (L) 11/22/2020   Lab Results  Component Value Date   ALT 21 11/22/2020   AST 16 11/22/2020   ALKPHOS 85 11/22/2020   BILITOT 0.4 11/22/2020   Lab Results  Component Value Date   HGBA1C 6.0 (H) 11/23/2020   HGBA1C 6.1 01/18/2020   HGBA1C 6.2 04/19/2019   HGBA1C 6.0 01/28/2018   Lab Results  Component Value Date   INSULIN 31.2 (H) 11/23/2020   Lab Results  Component Value Date   TSH 0.798 11/23/2020   Lab Results  Component Value Date   CHOL 158 11/23/2020   HDL 69 11/23/2020   LDLCALC 77 11/23/2020   LDLDIRECT 136.7 09/15/2012   TRIG 56 11/23/2020   CHOLHDL 2.3 11/23/2020   Lab Results  Component Value Date   WBC 11.6 (H) 11/22/2020   HGB 13.0 11/22/2020   HCT 38.4 11/22/2020   MCV 83.7 11/22/2020   PLT 241  11/22/2020   Lab Results  Component Value Date   FERRITIN 48 11/23/2020     Attestation Statements:   Reviewed by clinician on day of visit: allergies, medications, problem list, medical history, surgical history, family history, social history, and previous encounter notes.  Coral Ceo, CMA, am acting as transcriptionist for Coralie Common, MD.   I have reviewed the above documentation for accuracy and completeness, and I agree with the above. - Jinny Blossom, MD

## 2021-03-12 ENCOUNTER — Other Ambulatory Visit: Payer: Self-pay | Admitting: Family Medicine

## 2021-03-13 ENCOUNTER — Telehealth: Payer: Self-pay | Admitting: Family Medicine

## 2021-03-13 MED ORDER — CLONAZEPAM 0.5 MG PO TABS: 0.5000 mg | ORAL_TABLET | Freq: Every evening | ORAL | 5 refills | Status: DC | PRN

## 2021-03-13 NOTE — Telephone Encounter (Signed)
Pt is calling in needing a refill on Rx clonazepam (KLONOPIN) 0.5 MG.  Pharm:  [facility] at [address].  Pt stated that she is no longer taking the the gabapentin that the neurologist gave her due to the way it was making her feel.

## 2021-03-13 NOTE — Addendum Note (Signed)
Addended by: Eulas Post on: 03/13/2021 01:37 PM   Modules accepted: Orders

## 2021-03-13 NOTE — Telephone Encounter (Signed)
Last filled 07/09/2020 Last OV 09/18/2020  Ok to fill?

## 2021-03-19 ENCOUNTER — Other Ambulatory Visit: Payer: Self-pay

## 2021-03-19 ENCOUNTER — Ambulatory Visit (INDEPENDENT_AMBULATORY_CARE_PROVIDER_SITE_OTHER): Payer: Medicare Other | Admitting: Family Medicine

## 2021-03-19 ENCOUNTER — Encounter (INDEPENDENT_AMBULATORY_CARE_PROVIDER_SITE_OTHER): Payer: Self-pay | Admitting: Family Medicine

## 2021-03-19 VITALS — BP 116/75 | HR 62 | Temp 97.8°F | Ht 63.0 in | Wt 193.0 lb

## 2021-03-19 DIAGNOSIS — R5381 Other malaise: Secondary | ICD-10-CM | POA: Diagnosis not present

## 2021-03-19 DIAGNOSIS — E8881 Metabolic syndrome: Secondary | ICD-10-CM

## 2021-03-19 DIAGNOSIS — R5383 Other fatigue: Secondary | ICD-10-CM

## 2021-03-19 DIAGNOSIS — E039 Hypothyroidism, unspecified: Secondary | ICD-10-CM | POA: Diagnosis not present

## 2021-03-19 DIAGNOSIS — Z9189 Other specified personal risk factors, not elsewhere classified: Secondary | ICD-10-CM | POA: Diagnosis not present

## 2021-03-19 DIAGNOSIS — Z6837 Body mass index (BMI) 37.0-37.9, adult: Secondary | ICD-10-CM | POA: Diagnosis not present

## 2021-03-19 DIAGNOSIS — I10 Essential (primary) hypertension: Secondary | ICD-10-CM

## 2021-03-21 NOTE — Progress Notes (Signed)
Chief Complaint:   OBESITY Tiffany Montes is here to discuss her progress with her obesity treatment plan along with follow-up of her obesity related diagnoses.   Today's visit was #: 8 Starting weight: 214 lbs Starting date: 11/23/2020 Today's weight: 193 lbs Today's date: 03/19/2021 Weight change since last visit: -4 lbs Total lbs lost to date: -21 lbs Body mass index is 34.19 kg/m.  Total weight loss percentage to date: 9.81%  Interim History: Tiffany Montes has been doing well since her last visit. She is staying well hydrated and she has been increasing her physical activity. She does report still having increased fatigue though at today's visit.  Current Meal Plan: the Category 2 Plan plus 200 calories for 50% of the time.  Current Exercise Plan: walking the dog and doing a lot around the house. Current Anti-Obesity Medications: None.  Assessment/Plan:   1. Acquired hypothyroidism Course: Stable. Medication: Levothyroxine 145mcg, 1 tablet by mouth first thing upon waking up every morning.  Plan: Patient was instructed not to take MVM or iron within 4 hours of taking thyroid medications. This issue is managed by Dr. Elease Hashimoto. We will continue to monitor alongside Endocrinology/PCP as it relates to her weight loss journey.   Lab Results  Component Value Date   TSH 0.798 11/23/2020   2. Metabolic syndrome Starting goal: Lose 7-10% of starting weight. She will continue to focus on protein-rich, low simple carbohydrate foods. We reviewed the importance of hydration, regular exercise for stress reduction, and restorative sleep.  We will continue to check lab work every 3 months, with 10% weight loss, or should any other concerns arise.  3. Malaise and fatigue Tiffany Montes reports to positive symptoms of malaise and fatigue. Malaise and fatigue may be related to obesity or many other causes.Until her next visit, Tiffany Montes will focus on self care including making healthy food choices,  increasing physical activity and focusing on stress reduction.  4. Essential hypertension Controlled. Medications: Hyzaar 100-12.5mg  tablets, and she is taking only 0.5 tablet by mouth daily. Plan: Avoid buying foods that are: processed, frozen, or prepackaged to avoid excess salt. We will continue to monitor closely alongside her PCP and/or Specialist.    BP Readings from Last 3 Encounters:  03/19/21 116/75  02/28/21 123/78  02/12/21 122/87   Lab Results  Component Value Date   CREATININE 0.89 11/22/2020   5. At risk for heart disease Due to Tiffany Montes's current state of health and medical condition(s), she is at a higher risk for heart disease.  This puts the patient at much greater risk to subsequently develop cardiopulmonary conditions that can significantly affect patient's quality of life in a negative manner.    At least 9 minutes were spent on counseling Tiffany Montes about these concerns today. Evidence-based interventions for health behavior change were utilized today including the discussion of self monitoring techniques, problem-solving barriers, and SMART goal setting techniques.  Specifically, regarding patient's less desirable eating habits and patterns, we employed the technique of small changes when Tiffany Montes has not been able to fully commit to her prudent nutritional plan.  6. Obesity with current BMI of 34.2  Course: [name] is currently in the action stage of change. As such, her goal is to continue with weight loss efforts.   Nutrition goals: She has agreed to practicing portion control and making smarter food choices, such as increasing vegetables and decreasing simple carbohydrates.   Exercise goals:  As is  Behavioral modification strategies: increasing lean protein intake, decreasing simple carbohydrates,  increasing vegetables, and increasing water intake.  Tiffany Montes has agreed to follow-up with our clinic in 3 weeks. She was informed of the importance of frequent  follow-up visits to maximize her success with intensive lifestyle modifications for her multiple health conditions.   Objective:   Blood pressure 116/75, pulse 62, temperature 97.8 F (36.6 C), height 5\' 3"  (1.6 m), weight 193 lb (87.5 kg), SpO2 98 %. Body mass index is 34.19 kg/m.  General: Cooperative, alert, well developed, in no acute distress. HEENT: Conjunctivae and lids unremarkable. Cardiovascular: Regular rhythm.  Lungs: Normal work of breathing. Neurologic: No focal deficits.   Lab Results  Component Value Date   CREATININE 0.89 11/22/2020   BUN 14 11/22/2020   NA 138 11/22/2020   K 3.3 (L) 11/22/2020   CL 107 11/22/2020   CO2 20 (L) 11/22/2020   Lab Results  Component Value Date   ALT 21 11/22/2020   AST 16 11/22/2020   ALKPHOS 85 11/22/2020   BILITOT 0.4 11/22/2020   Lab Results  Component Value Date   HGBA1C 6.0 (H) 11/23/2020   HGBA1C 6.1 01/18/2020   HGBA1C 6.2 04/19/2019   HGBA1C 6.0 01/28/2018   Lab Results  Component Value Date   INSULIN 31.2 (H) 11/23/2020   Lab Results  Component Value Date   TSH 0.798 11/23/2020   Lab Results  Component Value Date   CHOL 158 11/23/2020   HDL 69 11/23/2020   LDLCALC 77 11/23/2020   LDLDIRECT 136.7 09/15/2012   TRIG 56 11/23/2020   CHOLHDL 2.3 11/23/2020   Lab Results  Component Value Date   WBC 11.6 (H) 11/22/2020   HGB 13.0 11/22/2020   HCT 38.4 11/22/2020   MCV 83.7 11/22/2020   PLT 241 11/22/2020   Lab Results  Component Value Date   FERRITIN 48 11/23/2020    Obesity Behavioral Intervention:   Approximately 15 minutes were spent on the discussion below.  ASK: We discussed the diagnosis of obesity with [name] today and Zhoey agreed to give [place] permission to discuss obesity behavioral modification therapy today.  ASSESS: Tiffany Montes has the diagnosis of obesity and her BMI today is 34.2. Tiffany Montes is in the action stage of change.   ADVISE: Tiffany Montes was educated on the multiple health  risks of obesity as well as the benefit of weight loss to improve her health. She was advised of the need for long term treatment and the importance of lifestyle modifications to improve her current health and to decrease her risk of future health problems.  AGREE: Multiple dietary modification options and treatment options were discussed and Staysha agreed to follow the recommendations documented in the above note.  ARRANGE: Marwa was educated on the importance of frequent visits to treat obesity as outlined per CMS and USPSTF guidelines and agreed to schedule her next follow up appointment today.  Attestation Statements:   Reviewed by clinician on day of visit: allergies, medications, problem list, medical history, surgical history, family history, social history, and previous encounter notes.  I, Noah Mincy,LPN am acting as Location manager for [employer], DO.  I have reviewed the above documentation for accuracy and completeness, and I agree with the above. Briscoe Deutscher, DO

## 2021-03-28 ENCOUNTER — Other Ambulatory Visit: Payer: Self-pay | Admitting: Family Medicine

## 2021-03-29 ENCOUNTER — Telehealth: Payer: Self-pay | Admitting: *Deleted

## 2021-03-29 ENCOUNTER — Other Ambulatory Visit: Payer: Self-pay | Admitting: *Deleted

## 2021-03-29 DIAGNOSIS — C50212 Malignant neoplasm of upper-inner quadrant of left female breast: Secondary | ICD-10-CM

## 2021-03-29 DIAGNOSIS — Z170 Estrogen receptor positive status [ER+]: Secondary | ICD-10-CM

## 2021-03-29 DIAGNOSIS — M5440 Lumbago with sciatica, unspecified side: Secondary | ICD-10-CM

## 2021-03-29 MED ORDER — GABAPENTIN 300 MG PO CAPS
300.0000 mg | ORAL_CAPSULE | Freq: Every day | ORAL | 3 refills | Status: DC
Start: 2021-03-29 — End: 2022-05-17

## 2021-03-29 NOTE — Telephone Encounter (Signed)
This RN spoke with pt per her call stating issues of ongoing low back pain now seeming to become more interfering in her ADL's.  She states she has had back xrays in the past and has been told she has " arthritis and the disc are flat in a couple of places "  She is inquiring if the increased symptoms are being aggravated by the tamoxifen.  This RN discussed tamoxifen and above is not generally a complaint related to it's use.  Per further inquiry - Tiffany Montes states back pain worsens with activity - she lays down and elevates she legs - pain improves.  Tiffany Montes  stated " you know last year I did not do as much because of Covid but now this summer I want to get and go - I don't like to sit still but now I am having this discomfort"  Per discussion -Tiffany Montes states history of being thrown from a horse ( about 20 years ago) and having pain in same area as injury. She also states history of sciatica and would like to proceed with an evaluation by Dr Charlann Boxer with [place] for possible therapies including cortisone injection. She states she has seen him before for her knee.  She is asking for a referral.  This  RN explained above may need to be done by her primary MD for appropriate coverage- with Tiffany Montes unsure if needed. She is asking for this office to place referral.  This RN informed her referral would be placed and communication of phone discussion will be forwarded to Dr Tiffany Montes.  Pt stated appreciation of discussion and plan.

## 2021-04-02 ENCOUNTER — Ambulatory Visit (INDEPENDENT_AMBULATORY_CARE_PROVIDER_SITE_OTHER): Payer: Medicare Other | Admitting: Family Medicine

## 2021-04-02 ENCOUNTER — Encounter (INDEPENDENT_AMBULATORY_CARE_PROVIDER_SITE_OTHER): Payer: Self-pay | Admitting: Family Medicine

## 2021-04-02 ENCOUNTER — Other Ambulatory Visit: Payer: Self-pay

## 2021-04-02 VITALS — BP 134/80 | HR 77 | Temp 97.9°F | Ht 63.0 in | Wt 193.0 lb

## 2021-04-02 DIAGNOSIS — R632 Polyphagia: Secondary | ICD-10-CM | POA: Diagnosis not present

## 2021-04-02 DIAGNOSIS — Z6837 Body mass index (BMI) 37.0-37.9, adult: Secondary | ICD-10-CM | POA: Diagnosis not present

## 2021-04-02 DIAGNOSIS — M5431 Sciatica, right side: Secondary | ICD-10-CM | POA: Diagnosis not present

## 2021-04-02 DIAGNOSIS — F908 Attention-deficit hyperactivity disorder, other type: Secondary | ICD-10-CM | POA: Diagnosis not present

## 2021-04-10 NOTE — Progress Notes (Signed)
Chief Complaint:   OBESITY Tiffany Montes is here to discuss her progress with her obesity treatment plan along with follow-up of her obesity related diagnoses.   Today's visit was #: 9 Starting weight: 214 lbs Starting date: 11/23/2020 Today's weight: 193 lbs Today's date: 04/02/2021 Weight change since last visit: 0 Total lbs lost to date: 21 lbs Body mass index is 34.19 kg/m.  Total weight loss percentage to date: -9.81%  Interim History:  Tiffany Montes feels that she has been less active and has been eating more due to working on the inside of her house. Current Meal Plan: the Category 2 Plan for 60% of the time.  Current Exercise Plan: Increased activity.  Assessment/Plan:   1. Right sided sciatica Worsening. Discussed symptomatic care. We will continue to monitor symptoms as it relates to her weight loss journey.  2. Polyphagia Controlled. Current treatment: None. Polyphagia refers to excessive feelings of hunger. She will continue to focus on protein-rich, low simple carbohydrate foods. We reviewed the importance of hydration, regular exercise for stress reduction, and restorative sleep.  3. Attention deficit hyperactivity disorder (ADHD) Controlled. Tiffany Montes is taking Adderall XR 30 mg daily for ADHD. Plan:  Continue Adderall.  4. Obesity with current BMI of 34.2  Course: Tiffany Montes is currently in the action stage of change. As such, her goal is to continue with weight loss efforts.   Nutrition goals: She has agreed to practicing portion control and making smarter food choices, such as increasing vegetables and decreasing simple carbohydrates.   Exercise goals:  As is.  Behavioral modification strategies: increasing lean protein intake, decreasing simple carbohydrates, increasing vegetables, and increasing water intake.  Tiffany Montes has agreed to follow-up with our clinic in 4 weeks. She was informed of the importance of frequent follow-up visits to maximize her success with  intensive lifestyle modifications for her multiple health conditions.   Objective:   Blood pressure 134/80, pulse 77, temperature 97.9 F (36.6 C), temperature source Oral, height 5\' 3"  (1.6 m), weight 193 lb (87.5 kg), SpO2 98 %. Body mass index is 34.19 kg/m.  General: Cooperative, alert, well developed, in no acute distress. HEENT: Conjunctivae and lids unremarkable. Cardiovascular: Regular rhythm.  Lungs: Normal work of breathing. Neurologic: No focal deficits.   Lab Results  Component Value Date   CREATININE 0.89 11/22/2020   BUN 14 11/22/2020   NA 138 11/22/2020   K 3.3 (L) 11/22/2020   CL 107 11/22/2020   CO2 20 (L) 11/22/2020   Lab Results  Component Value Date   ALT 21 11/22/2020   AST 16 11/22/2020   ALKPHOS 85 11/22/2020   BILITOT 0.4 11/22/2020   Lab Results  Component Value Date   HGBA1C 6.0 (H) 11/23/2020   HGBA1C 6.1 01/18/2020   HGBA1C 6.2 04/19/2019   HGBA1C 6.0 01/28/2018   Lab Results  Component Value Date   INSULIN 31.2 (H) 11/23/2020   Lab Results  Component Value Date   TSH 0.798 11/23/2020   Lab Results  Component Value Date   CHOL 158 11/23/2020   HDL 69 11/23/2020   LDLCALC 77 11/23/2020   LDLDIRECT 136.7 09/15/2012   TRIG 56 11/23/2020   CHOLHDL 2.3 11/23/2020   Lab Results  Component Value Date   VD25OH 53.1 11/23/2020   Lab Results  Component Value Date   WBC 11.6 (H) 11/22/2020   HGB 13.0 11/22/2020   HCT 38.4 11/22/2020   MCV 83.7 11/22/2020   PLT 241 11/22/2020   Lab Results  Component Value Date   FERRITIN 48 11/23/2020   Obesity Behavioral Intervention:   Approximately 15 minutes were spent on the discussion below.  ASK: We discussed the diagnosis of obesity with Tiffany Montes today and Tiffany Montes agreed to give [place] permission to discuss obesity behavioral modification therapy today.  ASSESS: [name] has the diagnosis of obesity and her BMI today is 34.2. Tiffany Montes is in the action stage of change.    ADVISE: Tiffany Montes was educated on the multiple health risks of obesity as well as the benefit of weight loss to improve her health. She was advised of the need for long term treatment and the importance of lifestyle modifications to improve her current health and to decrease her risk of future health problems.  AGREE: Multiple dietary modification options and treatment options were discussed and Tiffany Montes agreed to follow the recommendations documented in the above note.  ARRANGE: Tiffany Montes was educated on the importance of frequent visits to treat obesity as outlined per CMS and USPSTF guidelines and agreed to schedule her next follow up appointment today.  Attestation Statements:   Reviewed by clinician on day of visit: allergies, medications, problem list, medical history, surgical history, family history, social history, and previous encounter notes.  I, Water quality scientist, CMA, am acting as transcriptionist for Briscoe Deutscher, DO  I have reviewed the above documentation for accuracy and completeness, and I agree with the above. Briscoe Deutscher, DO

## 2021-04-13 ENCOUNTER — Other Ambulatory Visit: Payer: Self-pay | Admitting: Family Medicine

## 2021-04-23 ENCOUNTER — Encounter (INDEPENDENT_AMBULATORY_CARE_PROVIDER_SITE_OTHER): Payer: Self-pay

## 2021-04-23 ENCOUNTER — Encounter: Payer: Self-pay | Admitting: Oncology

## 2021-04-23 ENCOUNTER — Ambulatory Visit (INDEPENDENT_AMBULATORY_CARE_PROVIDER_SITE_OTHER): Payer: Medicare Other | Admitting: Family Medicine

## 2021-04-24 ENCOUNTER — Ambulatory Visit (INDEPENDENT_AMBULATORY_CARE_PROVIDER_SITE_OTHER): Payer: Medicare Other

## 2021-04-24 DIAGNOSIS — Z0000 Encounter for general adult medical examination without abnormal findings: Secondary | ICD-10-CM

## 2021-04-24 NOTE — Patient Instructions (Signed)
Tiffany Montes , Thank you for taking time to come for your Medicare Wellness Visit. I appreciate your ongoing commitment to your health goals. Please review the following plan we discussed and let me know if I can assist you in the future.   Screening recommendations/referrals: Colonoscopy: 05/13/2019 due 2030 Mammogram: 08/01/2020 Bone Density: 11/05/2019 Recommended yearly ophthalmology/optometry visit for glaucoma screening and checkup Recommended yearly dental visit for hygiene and checkup  Vaccinations: Influenza vaccine: due in fall 2022  Pneumococcal vaccine: completed series  Tdap vaccine: 04/19/2019 Shingles vaccine: will obtain local pharmacy     Advanced directives: will provide copies   Conditions/risks identified: none   Next appointment: 06/01/2021 0730am CPE   Dr. Elease Hashimoto    Preventive Care 11 Years and Older, Female Preventive care refers to lifestyle choices and visits with your health care provider that can promote health and wellness. What does preventive care include? A yearly physical exam. This is also called an annual well check. Dental exams once or twice a year. Routine eye exams. Ask your health care provider how often you should have your eyes checked. Personal lifestyle choices, including: Daily care of your teeth and gums. Regular physical activity. Eating a healthy diet. Avoiding tobacco and drug use. Limiting alcohol use. Practicing safe sex. Taking low-dose aspirin every day. Taking vitamin and mineral supplements as recommended by your health care provider. What happens during an annual well check? The services and screenings done by your health care provider during your annual well check will depend on your age, overall health, lifestyle risk factors, and family history of disease. Counseling  Your health care provider may ask you questions about your: Alcohol use. Tobacco use. Drug use. Emotional well-being. Home and relationship  well-being. Sexual activity. Eating habits. History of falls. Memory and ability to understand (cognition). Work and work Statistician. Reproductive health. Screening  You may have the following tests or measurements: Height, weight, and BMI. Blood pressure. Lipid and cholesterol levels. These may be checked every 5 years, or more frequently if you are over 25 years old. Skin check. Lung cancer screening. You may have this screening every year starting at age 3 if you have a 30-pack-year history of smoking and currently smoke or have quit within the past 15 years. Fecal occult blood test (FOBT) of the stool. You may have this test every year starting at age 16. Flexible sigmoidoscopy or colonoscopy. You may have a sigmoidoscopy every 5 years or a colonoscopy every 10 years starting at age 60. Hepatitis C blood test. Hepatitis B blood test. Sexually transmitted disease (STD) testing. Diabetes screening. This is done by checking your blood sugar (glucose) after you have not eaten for a while (fasting). You may have this done every 1-3 years. Bone density scan. This is done to screen for osteoporosis. You may have this done starting at age 10. Mammogram. This may be done every 1-2 years. Talk to your health care provider about how often you should have regular mammograms. Talk with your health care provider about your test results, treatment options, and if necessary, the need for more tests. Vaccines  Your health care provider may recommend certain vaccines, such as: Influenza vaccine. This is recommended every year. Tetanus, diphtheria, and acellular pertussis (Tdap, Td) vaccine. You may need a Td booster every 10 years. Zoster vaccine. You may need this after age 77. Pneumococcal 13-valent conjugate (PCV13) vaccine. One dose is recommended after age 82. Pneumococcal polysaccharide (PPSV23) vaccine. One dose is recommended after age 76.  Talk to your health care provider about which  screenings and vaccines you need and how often you need them. This information is not intended to replace advice given to you by your health care provider. Make sure you discuss any questions you have with your health care provider. Document Released: 10/20/2015 Document Revised: 06/12/2016 Document Reviewed: 07/25/2015 Elsevier Interactive Patient Education  2017 [place] Prevention in the Home Falls can cause injuries. They can happen to people of all ages. There are many things you can do to make your home safe and to help prevent falls. What can I do on the outside of my home? Regularly fix the edges of walkways and driveways and fix any cracks. Remove anything that might make you trip as you walk through a door, such as a raised step or threshold. Trim any bushes or trees on the path to your home. Use bright outdoor lighting. Clear any walking paths of anything that might make someone trip, such as rocks or tools. Regularly check to see if handrails are loose or broken. Make sure that both sides of any steps have handrails. Any raised decks and porches should have guardrails on the edges. Have any leaves, snow, or ice cleared regularly. Use sand or salt on walking paths during winter. Clean up any spills in your garage right away. This includes oil or grease spills. What can I do in the bathroom? Use night lights. Install grab bars by the toilet and in the tub and shower. Do not use towel bars as grab bars. Use non-skid mats or decals in the tub or shower. If you need to sit down in the shower, use a plastic, non-slip stool. Keep the floor dry. Clean up any water that spills on the floor as soon as it happens. Remove soap buildup in the tub or shower regularly. Attach bath mats securely with double-sided non-slip rug tape. Do not have throw rugs and other things on the floor that can make you trip. What can I do in the bedroom? Use night lights. Make sure that you have a  light by your bed that is easy to reach. Do not use any sheets or blankets that are too big for your bed. They should not hang down onto the floor. Have a firm chair that has side arms. You can use this for support while you get dressed. Do not have throw rugs and other things on the floor that can make you trip. What can I do in the kitchen? Clean up any spills right away. Avoid walking on wet floors. Keep items that you use a lot in easy-to-reach places. If you need to reach something above you, use a strong step stool that has a grab bar. Keep electrical cords out of the way. Do not use floor polish or wax that makes floors slippery. If you must use wax, use non-skid floor wax. Do not have throw rugs and other things on the floor that can make you trip. What can I do with my stairs? Do not leave any items on the stairs. Make sure that there are handrails on both sides of the stairs and use them. Fix handrails that are broken or loose. Make sure that handrails are as long as the stairways. Check any carpeting to make sure that it is firmly attached to the stairs. Fix any carpet that is loose or worn. Avoid having throw rugs at the top or bottom of the stairs. If you do have throw  rugs, attach them to the floor with carpet tape. Make sure that you have a light switch at the top of the stairs and the bottom of the stairs. If you do not have them, ask someone to add them for you. What else can I do to help prevent falls? Wear shoes that: Do not have high heels. Have rubber bottoms. Are comfortable and fit you well. Are closed at the toe. Do not wear sandals. If you use a stepladder: Make sure that it is fully opened. Do not climb a closed stepladder. Make sure that both sides of the stepladder are locked into place. Ask someone to hold it for you, if possible. Clearly mark and make sure that you can see: Any grab bars or handrails. First and last steps. Where the edge of each step  is. Use tools that help you move around (mobility aids) if they are needed. These include: Canes. Walkers. Scooters. Crutches. Turn on the lights when you go into a dark area. Replace any light bulbs as soon as they burn out. Set up your furniture so you have a clear path. Avoid moving your furniture around. If any of your floors are uneven, fix them. If there are any pets around you, be aware of where they are. Review your medicines with your doctor. Some medicines can make you feel dizzy. This can increase your chance of falling. Ask your doctor what other things that you can do to help prevent falls. This information is not intended to replace advice given to you by your health care provider. Make sure you discuss any questions you have with your health care provider. Document Released: 07/20/2009 Document Revised: 02/29/2016 Document Reviewed: 10/28/2014 Elsevier Interactive Patient Education  2017 [employer].

## 2021-04-24 NOTE — Progress Notes (Signed)
Subjective:   Tiffany Montes is a 65 y.o. female who presents for an Initial Medicare Annual Wellness Visit.  Patient unable to log into my chart , telephone visit completed.   Virtual Visit via Video Note  I connected with Tiffany Montes by a video enabled telemedicine application and verified that I am speaking with the correct person using two identifiers.  Location: Patient: Home Provider: Office Persons participating in the virtual visit: patient, provider   I discussed the limitations of evaluation and management by telemedicine and the availability of in person appointments. The patient expressed understanding and agreed to proceed.     Randel Pigg ,LPN  Review of Systems    N/a       Objective:    There were no vitals filed for this visit. There is no height or weight on file to calculate BMI.  Advanced Directives 11/04/2019 10/29/2019 01/13/2019 08/31/2018 08/12/2018 09/13/2011  Does Patient Have a Medical Advance Directive? Yes Yes Yes Yes Yes Patient does not have advance directive;Patient would not like information  Type of Advance Directive Living will Living will [place];Living will Healthcare Power of [place];Living will -  Does patient want to make changes to medical advance directive? No - Patient declined No - Patient declined No - Patient declined - No - Patient declined -  Copy of [place] in Chart? No - copy requested No - copy requested - No - copy requested No - copy requested -  Pre-existing out of facility DNR order (yellow form or pink MOST form) - - - - - No    Current Medications (verified) Outpatient Encounter Medications as of 04/24/2021  Medication Sig   Accu-Chek FastClix Lancets MISC Use to check blood sugar BID, DX:R73.03   ACCU-CHEK GUIDE test strip USE TO CHECK BLOOD SUGAR TWICE A DAY , DX:R73.03*NOT COVERED   amphetamine-dextroamphetamine (ADDERALL XR) 30 MG 24  hr capsule Take one capsule by mouth every morning.  May refill in one month.   benzonatate (TESSALON) 100 MG capsule TAKE 1 CAPSULE BY MOUTH 2 TIMES DAILY AS NEEDED FOR COUGH   Blood Glucose Monitoring Suppl (ACCU-CHEK AVIVA PLUS) w/Device KIT Use to check blood sugar BID, DX:R73.03   buPROPion (WELLBUTRIN XL) 300 MG 24 hr tablet TAKE ONE TABLET BY MOUTH ONE TIME DAILY   clonazePAM (KLONOPIN) 0.5 MG tablet Take 1 tablet (0.5 mg total) by mouth at bedtime as needed.   fluticasone (FLONASE) 50 MCG/ACT nasal spray USE 2 SPRAYS IN EACH NOSTRIL DAILY AS NEEDED FOR ALLERGIES   gabapentin (NEURONTIN) 300 MG capsule Take 1 capsule (300 mg total) by mouth at bedtime.   levothyroxine (SYNTHROID) 150 MCG tablet TAKE 1 TABLET BY MOUTH EVERY DAY(RECHECK LAB IN 3 MONTHS)   losartan-hydrochlorothiazide (HYZAAR) 100-12.5 MG tablet TAKE 1 TABLET BY MOUTH EVERY DAY   rosuvastatin (CRESTOR) 20 MG tablet TAKE 1 TABLET BY MOUTH EVERY DAY   sertraline (ZOLOFT) 100 MG tablet TAKE 1 TABLET BY MOUTH EVERY DAY   SUMAtriptan (IMITREX) 100 MG tablet TAKE 1 TABLET BY MOUTH AS NEEDED FOR MIGRAINE *MAY REPEAT IN 24 HOURS AS DIRECTED   TURMERIC-GINGER PO Take by mouth.   Vitamin D-Vitamin K (VITAMIN K2-VITAMIN D3 PO) Take 5,000 Units by mouth.   [DISCONTINUED] prochlorperazine (COMPAZINE) 10 MG tablet Take 1 tablet (10 mg total) by mouth every 6 (six) hours as needed (Nausea or vomiting).   No facility-administered encounter medications on file as of 04/24/2021.  Allergies (verified) Morphine sulfate   History: Past Medical History:  Diagnosis Date   ADHD    Anxiety    Attention deficit disorder    Back pain    Breast cancer ([place]) 2019   Left Breast Cancer   Depression    Gallbladder problem    Gallstones 09/05/2011   Hyperlipidemia    Hypertension    Hypothyroidism    Joint pain    Migraines    Neuropathy    Personal history of chemotherapy 2019   Left Breast Cancer   Personal history of radiation  therapy 2019   Left Breast Cancer   PONV (postoperative nausea and vomiting)    diffficulty waking up   Thyroid disease    hypothyroid   Past Surgical History:  Procedure Laterality Date   ABDOMINAL HYSTERECTOMY  2003   BREAST BIOPSY Left 2014   benign   BREAST LUMPECTOMY Left 09/07/2018   BREAST LUMPECTOMY WITH RADIOACTIVE SEED AND SENTINEL LYMPH NODE BIOPSY Left 09/07/2018   Procedure: LEFT BREAST LUMPECTOMY WITH RADIOACTIVE SEED AND AXILLARY SENTINEL LYMPH NODE BIOPSY,INJECT BLUE DYE LEFT BREAST;  Surgeon: Fanny Skates, MD;  Location: [facility];  Service: General;  Laterality: Left;   CHOLECYSTECTOMY  09/26/2011   Procedure: LAPAROSCOPIC CHOLECYSTECTOMY WITH INTRAOPERATIVE CHOLANGIOGRAM;  Surgeon: Haywood Lasso, MD;  Location: [facility];  Service: General;  Laterality: N/A;   COLONOSCOPY     CYSTIC HYGROMA EXCISION     DENTAL SURGERY     skin graft from top of mouth to lower gums   PORT-A-CATH REMOVAL Right 11/04/2019   Procedure: REMOVAL PORT-A-CATH;  Surgeon: Donnie Mesa, MD;  Location: [facility];  Service: General;  Laterality: Right;   PORTACATH PLACEMENT N/A 09/07/2018   Procedure: [place] WITH [place];  Surgeon: Fanny Skates, MD;  Location: [facility];  Service: General;  Laterality: N/A;   REPLACEMENT TOTAL KNEE Right    Family History  Problem Relation Age of Onset   Diabetes Father    Heart disease Father    Lung cancer Father    Hypertension Father    Hyperlipidemia Father    Thyroid disease Father    Alcoholism Father    Hypertension Mother    Thyroid disease Mother    Breast cancer Neg Hx    Ovarian cancer Neg Hx    Social History   Socioeconomic History   Marital status: Divorced    Spouse name: Not on file   Number of children: Not on file   Years of education: Not on file   Highest education level: Not on file  Occupational History   Occupation: retired  Tobacco Use   Smoking status: Never   Smokeless tobacco: Never   Vaping Use   Vaping Use: Never used  Substance and Sexual Activity   Alcohol use: Yes    Comment: 1-2 a week and reports not every week   Drug use: No   Sexual activity: Not Currently    Birth control/protection: Surgical  Other Topics Concern   Not on file  Social History Narrative   Not on file   Social Determinants of Health   Financial Resource Strain: Not on file  Food Insecurity: Not on file  Transportation Needs: Not on file  Physical Activity: Not on file  Stress: Not on file  Social Connections: Not on file    Tobacco Counseling Counseling given: Not Answered   Clinical Intake:  Diabetic?no         Activities of Daily Living No flowsheet data found.  Patient Care Team: Eulas Post, MD as PCP - General Magrinat, Virgie Dad, MD as Consulting Physician (Oncology) Kyung Rudd, MD as Consulting Physician (Radiation Oncology) Maisie Fus, MD as Consulting Physician (Obstetrics and Gynecology) Larey Dresser, MD as Consulting Physician (Cardiology)  Indicate any recent Medical Services you may have received from other than [facility] providers in the past year (date may be approximate).     Assessment:   This is a routine wellness examination for Tarini.  Hearing/Vision screen No results found.  Dietary issues and exercise activities discussed:     Goals Addressed   None    Depression Screen PHQ 2/9 Scores 11/23/2020  PHQ - 2 Score 6  PHQ- 9 Score 18    Fall Risk No flowsheet data found.  FALL RISK PREVENTION PERTAINING TO THE HOME:  Any stairs in or around the home? No  If so, are there any without handrails? No  Home free of loose throw rugs in walkways, pet beds, electrical cords, etc? Yes  Adequate lighting in your home to reduce risk of falls? Yes   ASSISTIVE DEVICES UTILIZED TO PREVENT FALLS:  Life alert? No  Use of a cane, walker or w/c? No  Grab bars in the bathroom? Yes  Shower chair or bench  in shower? Yes  Elevated toilet seat or a handicapped toilet? Yes    Cognitive Function:Normal cognitive status assessed by direct observation by this Nurse Health Advisor. No abnormalities found.          Immunizations Immunization History  Administered Date(s) Administered   Fluad Quad(high Dose 65+) 08/28/2020   Influenza Split 08/08/2011, 08/24/2012   Influenza, Seasonal, Injecte, Preservative Fre 07/26/2014   Influenza,inj,Quad PF,6+ Mos 06/03/2017, 08/08/2018, 08/02/2019   Influenza-Unspecified 07/07/2016, 08/08/2018   PFIZER Comirnaty(Gray Top)Covid-19 Tri-Sucrose Vaccine 03/20/2021   PFIZER(Purple Top)SARS-COV-2 Vaccination 12/05/2019, 12/29/2019, 08/28/2020   Pneumococcal Conjugate-13 09/28/2018   Pneumococcal Polysaccharide-23 04/19/2020   Td 04/14/2009   Tdap 04/19/2019   Zoster, Live 12/04/2015    TDAP status: Up to date  Flu Vaccine status: Up to date  Pneumococcal vaccine status: Up to date  Covid-19 vaccine status: Completed vaccines  Qualifies for Shingles Vaccine? Yes   Zostavax completed No   Shingrix Completed?: No.    Education has been provided regarding the importance of this vaccine. Patient has been advised to call insurance company to determine out of pocket expense if they have not yet received this vaccine. Advised may also receive vaccine at local pharmacy or Health Dept. Verbalized acceptance and understanding.  Screening Tests Health Maintenance  Topic Date Due   Zoster Vaccines- Shingrix (1 of 2) Never done   INFLUENZA VACCINE  05/07/2021   COVID-19 Vaccine (5 - Booster for Pfizer series) 07/20/2021   MAMMOGRAM  08/01/2022   TETANUS/TDAP  04/18/2029   COLONOSCOPY (Pts 45-38yr Insurance coverage will need to be confirmed)  05/12/2029   DEXA SCAN  Completed   Hepatitis C Screening  Completed   PNA vac Low Risk Adult  Completed   HPV VACCINES  Aged Out    Health Maintenance  Health Maintenance Due  Topic Date Due   Zoster  Vaccines- Shingrix (1 of 2) Never done    Colorectal cancer screening: Type of screening: Colonoscopy. Completed 05/13/2019. Repeat every 10 years  Mammogram status: Completed 08/01/2020. Repeat every year  Bone Density status: Completed 11/05/2019. Results reflect: Bone density  results: OSTEOPENIA. Repeat every 5 years.  Lung Cancer Screening: (Low Dose CT Chest recommended if Age 61-80 years, 30 pack-year currently smoking OR have quit w/in 15years.) does not qualify.   Lung Cancer Screening Referral: n/a  Additional Screening:  Hepatitis C Screening: does not qualify; Completed 01/28/2018  Vision Screening: Recommended annual ophthalmology exams for early detection of glaucoma and other disorders of the eye. Is the patient up to date with their annual eye exam?  Yes  Who is the provider or what is the name of the office in which the patient attends annual eye exams? Dr.wood  If pt is not established with a provider, would they like to be referred to a provider to establish care? No .   Dental Screening: Recommended annual dental exams for proper oral hygiene  Community Resource Referral / Chronic Care Management: CRR required this visit?  No   CCM required this visit?  No      Plan:     I have personally reviewed and noted the following in the patient's chart:   Medical and social history Use of alcohol, tobacco or illicit drugs  Current medications and supplements including opioid prescriptions. Patient is not currently taking opioid prescriptions. Functional ability and status Nutritional status Physical activity Advanced directives List of other physicians Hospitalizations, surgeries, and ER visits in previous 12 months Vitals Screenings to include cognitive, depression, and falls Referrals and appointments  In addition, I have reviewed and discussed with patient certain preventive protocols, quality metrics, and best practice recommendations. A written  personalized care plan for preventive services as well as general preventive health recommendations were provided to patient.     Randel Pigg, LPN   06/08/4096   Nurse Notes: none

## 2021-05-07 ENCOUNTER — Ambulatory Visit (INDEPENDENT_AMBULATORY_CARE_PROVIDER_SITE_OTHER): Payer: Medicare Other | Admitting: Family Medicine

## 2021-05-07 ENCOUNTER — Other Ambulatory Visit: Payer: Self-pay | Admitting: Family Medicine

## 2021-05-31 ENCOUNTER — Other Ambulatory Visit: Payer: Self-pay | Admitting: Family Medicine

## 2021-06-01 ENCOUNTER — Other Ambulatory Visit: Payer: Self-pay

## 2021-06-01 ENCOUNTER — Telehealth (INDEPENDENT_AMBULATORY_CARE_PROVIDER_SITE_OTHER): Payer: Medicare Other | Admitting: Family Medicine

## 2021-06-01 ENCOUNTER — Encounter: Payer: Medicare Other | Admitting: Family Medicine

## 2021-06-01 DIAGNOSIS — R059 Cough, unspecified: Secondary | ICD-10-CM | POA: Diagnosis not present

## 2021-06-01 NOTE — Progress Notes (Signed)
Patient ID: Tiffany Montes, female   DOB: 01-12-1956, 65 y.o.   MRN: [id number]  This visit type was conducted due to national recommendations for restrictions regarding the COVID-19 pandemic in an effort to limit this patient's exposure and mitigate transmission in our community.   Virtual Visit via Telephone Note  I connected with Dashon Muenzer on 06/01/21 at 11:30 AM EDT by telephone and verified that I am speaking with the correct person using two identifiers.   I discussed the limitations, risks, security and privacy concerns of performing an evaluation and management service by telephone and the availability of in person appointments. I also discussed with the patient that there may be a patient responsible charge related to this service. The patient expressed understanding and agreed to proceed.  Location patient: home Location provider: work or home office Participants present for the call: patient, provider Patient did not have a visit in the prior 7 days to address this/these issue(s).  We were unable to connect with virtual visit after patient attempted multiple times.   History of Present Illness: Tiffany Montes relates 2-week history of cough.  She states that she started with some nasal congestion and upper respiratory symptoms.  Her daughter and her partner both tested positive for COVID and she was around them.  Tiffany Montes did several home tests that were negative and then last Thursday she went to [facility] and did PCR test which also came back negative.  She has not experiencing any dyspnea.  No fever.  Using some leftover Tessalon which helps.  No obvious wheezing.  No hemoptysis.  Occasional postnasal drip symptoms.  Past Medical History:  Diagnosis Date   ADHD    Anxiety    Attention deficit disorder    Back pain    Breast cancer ([place]) 2019   Left Breast Cancer   Depression    Gallbladder problem    Gallstones 09/05/2011   Hyperlipidemia    Hypertension     Hypothyroidism    Joint pain    Migraines    Neuropathy    Personal history of chemotherapy 2019   Left Breast Cancer   Personal history of radiation therapy 2019   Left Breast Cancer   PONV (postoperative nausea and vomiting)    diffficulty waking up   Thyroid disease    hypothyroid   Past Surgical History:  Procedure Laterality Date   ABDOMINAL HYSTERECTOMY  2003   BREAST BIOPSY Left 2014   benign   BREAST LUMPECTOMY Left 09/07/2018   BREAST LUMPECTOMY WITH RADIOACTIVE SEED AND SENTINEL LYMPH NODE BIOPSY Left 09/07/2018   Procedure: LEFT BREAST LUMPECTOMY WITH RADIOACTIVE SEED AND AXILLARY SENTINEL LYMPH NODE BIOPSY,INJECT BLUE DYE LEFT BREAST;  Surgeon: Fanny Skates, MD;  Location: [place];  Service: General;  Laterality: Left;   CHOLECYSTECTOMY  09/26/2011   Procedure: LAPAROSCOPIC CHOLECYSTECTOMY WITH INTRAOPERATIVE CHOLANGIOGRAM;  Surgeon: Haywood Lasso, MD;  Location: [place];  Service: General;  Laterality: N/A;   COLONOSCOPY     CYSTIC HYGROMA EXCISION     DENTAL SURGERY     skin graft from top of mouth to lower gums   PORT-A-CATH REMOVAL Right 11/04/2019   Procedure: REMOVAL PORT-A-CATH;  Surgeon: Donnie Mesa, MD;  Location: [place] [place];  Service: General;  Laterality: Right;   PORTACATH PLACEMENT N/A 09/07/2018   Procedure: INSERTION PORT-A-CATH WITH [place];  Surgeon: Fanny Skates, MD;  Location: [place];  Service: General;  Laterality: N/A;   REPLACEMENT TOTAL KNEE Right  reports that she has never smoked. She has never used smokeless tobacco. She reports current alcohol use. She reports that she does not use drugs. family history includes Alcoholism in her father; Diabetes in her father; Heart disease in her father; Hyperlipidemia in her father; Hypertension in her father and mother; Lung cancer in her father; Thyroid disease in her father and mother. Allergies  Allergen Reactions   Morphine Sulfate Nausea And Vomiting and Rash    GI upset       Observations/Objective: Patient sounds cheerful and well on the phone. I do not appreciate any SOB. Speech and thought processing are grossly intact. Patient reported vitals:  Assessment and Plan:  2-week history of cough and upper respiratory symptoms with multiple negative COVID tests.  Sounds most likely viral.  Patient in no respiratory distress.  -Recommend observation for now.  She will continue with Tessalon as needed and consider over-the-counter Mucinex. -Follow-up promptly for any fever or increased shortness of breath -Touch base if cough not resolving in 1 to 2 weeks  Follow Up Instructions:    99441 5-10 [id number] 11-20 99443 21-30 I did not refer this patient for an OV in the next 24 hours for this/these issue(s).  I discussed the assessment and treatment plan with the patient. The patient was provided an opportunity to ask questions and all were answered. The patient agreed with the plan and demonstrated an understanding of the instructions.   The patient was advised to call back or seek an in-person evaluation if the symptoms worsen or if the condition fails to improve as anticipated.  I provided 14 minutes of non-face-to-face time during this encounter.   Carolann Littler, MD

## 2021-06-04 ENCOUNTER — Encounter (INDEPENDENT_AMBULATORY_CARE_PROVIDER_SITE_OTHER): Payer: Self-pay | Admitting: Family Medicine

## 2021-06-04 ENCOUNTER — Ambulatory Visit (INDEPENDENT_AMBULATORY_CARE_PROVIDER_SITE_OTHER): Payer: Medicare Other | Admitting: Family Medicine

## 2021-06-04 ENCOUNTER — Other Ambulatory Visit: Payer: Self-pay

## 2021-06-04 ENCOUNTER — Telehealth: Payer: Self-pay | Admitting: Family Medicine

## 2021-06-04 VITALS — BP 126/58 | HR 74 | Temp 97.9°F | Ht 63.0 in | Wt 190.0 lb

## 2021-06-04 DIAGNOSIS — E65 Localized adiposity: Secondary | ICD-10-CM

## 2021-06-04 DIAGNOSIS — I10 Essential (primary) hypertension: Secondary | ICD-10-CM

## 2021-06-04 DIAGNOSIS — Z6837 Body mass index (BMI) 37.0-37.9, adult: Secondary | ICD-10-CM

## 2021-06-04 DIAGNOSIS — R059 Cough, unspecified: Secondary | ICD-10-CM

## 2021-06-04 MED ORDER — AMPHETAMINE-DEXTROAMPHET ER 30 MG PO CP24: ORAL_CAPSULE | ORAL | 0 refills | Status: DC

## 2021-06-04 MED ORDER — AMPHETAMINE-DEXTROAMPHET ER 30 MG PO CP24: 30.0000 mg | ORAL_CAPSULE | ORAL | 0 refills | Status: DC

## 2021-06-04 MED ORDER — ALBUTEROL SULFATE HFA 108 (90 BASE) MCG/ACT IN AERS: 1.0000 | INHALATION_SPRAY | RESPIRATORY_TRACT | 0 refills | Status: DC | PRN

## 2021-06-04 NOTE — Telephone Encounter (Signed)
Last filled 01/23/2021 Last OV 06/01/2021  Ok to fill?

## 2021-06-04 NOTE — Telephone Encounter (Signed)
Pt call and want a refill on amphetamine-dextroamphetamine (ADDERALL XR) 30 MG 24 hr capsule  sent to  [id number]- [place], [place] - [address]:  [phone] Fax:  [phone]

## 2021-06-05 NOTE — Progress Notes (Signed)
Chief Complaint:   OBESITY Tiffany Montes is here to discuss her progress with her obesity treatment plan along with follow-up of her obesity related diagnoses.   Today's visit was #: 10 Starting weight: 214 lbs Starting date: 11/23/2020 Today's weight: 190 lbs Today's date: 06/04/2021 Weight change since last visit: 3 lbs Total lbs lost to date: 24 lbs Body mass index is 33.66 kg/m.  Total weight loss percentage to date: -11.21%  Current Meal Plan: practicing portion control and making smarter food choices, such as increasing vegetables and decreasing simple carbohydrates for 0% of the time.  Current Exercise Plan: None.  Interim History:  Tiffany Montes had a URI v. allergies last week.  Still with dry cough.  Able to get out in the yard and work.  Assessment/Plan:   1. Cough Will start albuterol 1-2 puffs every 4 hours as needed.  - Start albuterol (VENTOLIN HFA) 108 (90 Base) MCG/ACT inhaler; Inhale 1-2 puffs into the lungs every 4 (four) hours as needed for wheezing or shortness of breath.  Dispense: 8 g; Refill: 0  2. Visceral obesity Current visceral fat rating: 14. Visceral fat rating should be < 13. Visceral adipose tissue is a hormonally active component of total body fat. This body composition phenotype is associated with medical disorders such as metabolic syndrome, cardiovascular disease and several malignancies including prostate, breast, and colorectal cancers. Starting goal: Lose 7-10% of starting weight.   3. Essential hypertension At goal. Medications: Hyzaar 100-12.5 mg daily.   Plan: Avoid buying foods that are: processed, frozen, or prepackaged to avoid excess salt. We will watch for signs of hypotension as she continues lifestyle modifications.  BP Readings from Last 3 Encounters:  06/04/21 (!) 126/58  04/02/21 134/80  03/19/21 116/75   Lab Results  Component Value Date   CREATININE 0.89 11/22/2020   4. Obesity with current BMI of 33.8  Course: Tiffany Montes  is currently in the action stage of change. As such, her goal is to continue with weight loss efforts.   Nutrition goals: She has agreed to practicing portion control and making smarter food choices, such as increasing vegetables and decreasing simple carbohydrates.   Exercise goals: All adults should avoid inactivity. Some physical activity is better than none, and adults who participate in any amount of physical activity gain some health benefits.  Behavioral modification strategies: increasing lean protein intake, decreasing simple carbohydrates, increasing vegetables, and increasing water intake.  Tiffany Montes has agreed to follow-up with our clinic in 4 weeks. She was informed of the importance of frequent follow-up visits to maximize her success with intensive lifestyle modifications for her multiple health conditions.   Objective:   Blood pressure (!) 126/58, pulse 74, temperature 97.9 F (36.6 C), temperature source Oral, height '5\' 3"'$  (1.6 m), weight 190 lb (86.2 kg), SpO2 97 %. Body mass index is 33.66 kg/m.  General: Cooperative, alert, well developed, in no acute distress. HEENT: Conjunctivae and lids unremarkable. Cardiovascular: Regular rhythm.  Lungs: Normal work of breathing. Neurologic: No focal deficits.   Lab Results  Component Value Date   CREATININE 0.89 11/22/2020   BUN 14 11/22/2020   NA 138 11/22/2020   K 3.3 (L) 11/22/2020   CL 107 11/22/2020   CO2 20 (L) 11/22/2020   Lab Results  Component Value Date   ALT 21 11/22/2020   AST 16 11/22/2020   ALKPHOS 85 11/22/2020   BILITOT 0.4 11/22/2020   Lab Results  Component Value Date   HGBA1C 6.0 (H) 11/23/2020  HGBA1C 6.1 01/18/2020   HGBA1C 6.2 04/19/2019   HGBA1C 6.0 01/28/2018   Lab Results  Component Value Date   INSULIN 31.2 (H) 11/23/2020   Lab Results  Component Value Date   TSH 0.798 11/23/2020   Lab Results  Component Value Date   CHOL 158 11/23/2020   HDL 69 11/23/2020   LDLCALC 77  11/23/2020   LDLDIRECT 136.7 09/15/2012   TRIG 56 11/23/2020   CHOLHDL 2.3 11/23/2020   Lab Results  Component Value Date   VD25OH 53.1 11/23/2020   Lab Results  Component Value Date   WBC 11.6 (H) 11/22/2020   HGB 13.0 11/22/2020   HCT 38.4 11/22/2020   MCV 83.7 11/22/2020   PLT 241 11/22/2020   Lab Results  Component Value Date   FERRITIN 48 11/23/2020   Obesity Behavioral Intervention:   Approximately 15 minutes were spent on the discussion below.  ASK: We discussed the diagnosis of obesity with Tiffany Montes today and Tiffany Montes agreed to give [place] permission to discuss obesity behavioral modification therapy today.  ASSESS: Biak has the diagnosis of obesity and her BMI today is 33.8. [place] is in the action stage of change.   ADVISE: Ginelle was educated on the multiple health risks of obesity as well as the benefit of weight loss to improve her health. She was advised of the need for long term treatment and the importance of lifestyle modifications to improve her current health and to decrease her risk of future health problems.  AGREE: Multiple dietary modification options and treatment options were discussed and Tiffany Montes agreed to follow the recommendations documented in the above note.  ARRANGE: Tiffany Montes was educated on the importance of frequent visits to treat obesity as outlined per CMS and USPSTF guidelines and agreed to schedule her next follow up appointment today.  Attestation Statements:   Reviewed by clinician on day of visit: allergies, medications, problem list, medical history, surgical history, family history, social history, and previous encounter notes.  I, Water quality scientist, CMA, am acting as transcriptionist for Briscoe Deutscher, DO  I have reviewed the above documentation for accuracy and completeness, and I agree with the above. Briscoe Deutscher, DO

## 2021-06-18 ENCOUNTER — Other Ambulatory Visit: Payer: Self-pay

## 2021-06-19 ENCOUNTER — Ambulatory Visit (INDEPENDENT_AMBULATORY_CARE_PROVIDER_SITE_OTHER): Payer: Medicare Other | Admitting: Family Medicine

## 2021-06-19 VITALS — BP 132/82 | HR 80 | Temp 97.6°F | Ht 63.0 in | Wt 196.1 lb

## 2021-06-19 DIAGNOSIS — I10 Essential (primary) hypertension: Secondary | ICD-10-CM

## 2021-06-19 DIAGNOSIS — F908 Attention-deficit hyperactivity disorder, other type: Secondary | ICD-10-CM | POA: Diagnosis not present

## 2021-06-19 DIAGNOSIS — E039 Hypothyroidism, unspecified: Secondary | ICD-10-CM

## 2021-06-19 DIAGNOSIS — Z23 Encounter for immunization: Secondary | ICD-10-CM | POA: Diagnosis not present

## 2021-06-19 DIAGNOSIS — E782 Mixed hyperlipidemia: Secondary | ICD-10-CM

## 2021-06-19 NOTE — Progress Notes (Signed)
Established Patient Office Visit  Subjective:  Patient ID: Tiffany Montes, female    DOB: 1956-09-30  Age: 65 y.o. MRN: [id number]  CC:  Chief Complaint  Patient presents with   Annual Exam    HPI Tiffany Montes was initially scheduled for a "complete physical.  However, she has Medicare primary and already had her Medicare wellness visit.  We are seeing her today for medical follow-up.  She has been followed recently by the [facility] medical weight loss clinic.  She has managed to lose about 15 pounds.  Had some recent poor compliance and gained back 3 pounds past couple weeks.  Her chronic problems include history of hypertension, hypothyroidism, metabolic syndrome, prediabetes, adult ADD.  With her recent weight loss she actually had to scale back one of her blood pressure medications to half dose because of some lightheadedness.  Blood pressure stable today.  She had multiple recent labs done through the weight loss clinic and these were reviewed.  She had recent thyroid functions that were normal.  She does not take her Adderall regularly and has expressed that she might like to go back to immediate release given the fact that she does not need this to last 10 to 12 hours as she is no longer working.  She has history of breast cancer and is followed closely by oncology.  She is getting yearly mammograms.  Had previous Zostavax but no history of Shingrix vaccine.  Needs flu vaccine still this season.  Past Medical History:  Diagnosis Date   ADHD    Anxiety    Attention deficit disorder    Back pain    Breast cancer ([place]) 2019   Left Breast Cancer   Depression    Gallbladder problem    Gallstones 09/05/2011   Hyperlipidemia    Hypertension    Hypothyroidism    Joint pain    Migraines    Neuropathy    Personal history of chemotherapy 2019   Left Breast Cancer   Personal history of radiation therapy 2019   Left Breast Cancer   PONV (postoperative nausea and  vomiting)    diffficulty waking up   Thyroid disease    hypothyroid    Past Surgical History:  Procedure Laterality Date   ABDOMINAL HYSTERECTOMY  2003   BREAST BIOPSY Left 2014   benign   BREAST LUMPECTOMY Left 09/07/2018   BREAST LUMPECTOMY WITH RADIOACTIVE SEED AND SENTINEL LYMPH NODE BIOPSY Left 09/07/2018   Procedure: LEFT BREAST LUMPECTOMY WITH RADIOACTIVE SEED AND AXILLARY SENTINEL LYMPH NODE BIOPSY,INJECT BLUE DYE LEFT BREAST;  Surgeon: Fanny Skates, MD;  Location: [place];  Service: General;  Laterality: Left;   CHOLECYSTECTOMY  09/26/2011   Procedure: LAPAROSCOPIC CHOLECYSTECTOMY WITH INTRAOPERATIVE CHOLANGIOGRAM;  Surgeon: Haywood Lasso, MD;  Location: [place];  Service: General;  Laterality: N/A;   COLONOSCOPY     CYSTIC HYGROMA EXCISION     DENTAL SURGERY     skin graft from top of mouth to lower gums   PORT-A-CATH REMOVAL Right 11/04/2019   Procedure: REMOVAL PORT-A-CATH;  Surgeon: Donnie Mesa, MD;  Location: [place];  Service: General;  Laterality: Right;   PORTACATH PLACEMENT N/A 09/07/2018   Procedure: INSERTION PORT-A-CATH WITH [place];  Surgeon: Fanny Skates, MD;  Location: [facility] OR;  Service: General;  Laterality: N/A;   REPLACEMENT TOTAL KNEE Right     Family History  Problem Relation Age of Onset   Diabetes Father    Heart disease Father  Lung cancer Father    Hypertension Father    Hyperlipidemia Father    Thyroid disease Father    Alcoholism Father    Hypertension Mother    Thyroid disease Mother    Breast cancer Neg Hx    Ovarian cancer Neg Hx     Social History   Socioeconomic History   Marital status: Divorced    Spouse name: Not on file   Number of children: Not on file   Years of education: Not on file   Highest education level: Not on file  Occupational History   Occupation: retired  Tobacco Use   Smoking status: Never   Smokeless tobacco: Never  Vaping Use   Vaping Use: Never used  Substance and Sexual Activity    Alcohol use: Yes    Comment: 1-2 a week and reports not every week   Drug use: No   Sexual activity: Not Currently    Birth control/protection: Surgical  Other Topics Concern   Not on file  Social History Narrative   Not on file   Social Determinants of Health   Financial Resource Strain: Low Risk    Difficulty of Paying Living Expenses: Not hard at all  Food Insecurity: No Food Insecurity   Worried About Charity fundraiser in the Last Year: Never true   [place] in the Last Year: Never true  Transportation Needs: No Transportation Needs   Lack of Transportation (Medical): No   Lack of Transportation (Non-Medical): No  Physical Activity: Insufficiently Active   Days of Exercise per Week: 3 days   Minutes of Exercise per Session: 30 min  Stress: No Stress Concern Present   Feeling of Stress : Not at all  Social Connections: Moderately Isolated   Frequency of Communication with Friends and Family: Three times a week   Frequency of Social Gatherings with Friends and Family: Three times a week   Attends Religious Services: More than 4 times per year   Active Member of Clubs or Organizations: No   Attends Archivist Meetings: Never   Marital Status: Divorced  Human resources officer Violence: Not At Risk   Fear of Current or Ex-Partner: No   Emotionally Abused: No   Physically Abused: No   Sexually Abused: No    Outpatient Medications Prior to Visit  Medication Sig Dispense Refill   Accu-Chek FastClix Lancets MISC Use to check blood sugar BID, DX:R73.03 100 each 0   ACCU-CHEK GUIDE test strip USE TO CHECK BLOOD SUGAR TWICE A DAY , DX:R73.03*NOT COVERED 100 strip 0   albuterol (VENTOLIN HFA) 108 (90 Base) MCG/ACT inhaler Inhale 1-2 puffs into the lungs every 4 (four) hours as needed for wheezing or shortness of breath. 8 g 0   amphetamine-dextroamphetamine (ADDERALL XR) 30 MG 24 hr capsule Take one capsule by mouth every morning.  May refill in one month. 30  capsule 0   amphetamine-dextroamphetamine (ADDERALL XR) 30 MG 24 hr capsule Take 1 capsule by mouth every morning.  May refill in 2 months. 30 capsule 0   amphetamine-dextroamphetamine (ADDERALL XR) 30 MG 24 hr capsule Take 1 capsule (30 mg total) by mouth every morning. 30 capsule 0   Blood Glucose Monitoring Suppl (ACCU-CHEK AVIVA PLUS) w/Device KIT Use to check blood sugar BID, DX:R73.03 1 kit 0   buPROPion (WELLBUTRIN XL) 300 MG 24 hr tablet TAKE ONE TABLET BY MOUTH ONE TIME DAILY 90 tablet 0   clonazePAM (KLONOPIN) 0.5 MG tablet Take 1  tablet (0.5 mg total) by mouth at bedtime as needed. 30 tablet 5   fluticasone (FLONASE) 50 MCG/ACT nasal spray USE 2 SPRAYS IN EACH NOSTRIL DAILY AS NEEDED FOR ALLERGIES 48 mL 1   gabapentin (NEURONTIN) 300 MG capsule Take 1 capsule (300 mg total) by mouth at bedtime. 90 capsule 3   levothyroxine (SYNTHROID) 150 MCG tablet TAKE 1 TABLET BY MOUTH EVERY DAY(RECHECK LAB IN 3 MONTHS) 90 tablet 3   losartan-hydrochlorothiazide (HYZAAR) 100-12.5 MG tablet TAKE 1 TABLET BY MOUTH EVERY DAY 90 tablet 1   rosuvastatin (CRESTOR) 20 MG tablet TAKE 1 TABLET BY MOUTH EVERY DAY 90 tablet 0   sertraline (ZOLOFT) 100 MG tablet TAKE 1 TABLET BY MOUTH EVERY DAY 90 tablet 1   SUMAtriptan (IMITREX) 100 MG tablet TAKE 1 TABLET BY MOUTH AS NEEDED FOR MIGRAINE *MAY REPEAT IN 24 HOURS AS DIRECTED 12 tablet 1   TURMERIC-GINGER PO Take by mouth.     Vitamin D-Vitamin K (VITAMIN K2-VITAMIN D3 PO) Take 5,000 Units by mouth.     benzonatate (TESSALON) 100 MG capsule TAKE 1 CAPSULE BY MOUTH 2 TIMES DAILY AS NEEDED FOR COUGH 40 capsule 0   No facility-administered medications prior to visit.    Allergies  Allergen Reactions   Morphine Sulfate Nausea And Vomiting and Rash    GI upset    ROS Review of Systems  Constitutional:  Negative for fatigue and unexpected weight change.  Eyes:  Negative for visual disturbance.  Respiratory:  Negative for cough, chest tightness, shortness of  breath and wheezing.   Cardiovascular:  Negative for chest pain, palpitations and leg swelling.  Endocrine: Negative for polydipsia and polyuria.  Genitourinary:  Negative for dysuria.  Neurological:  Negative for dizziness, seizures, syncope, weakness, light-headedness and headaches.     Objective:    Physical Exam Constitutional:      Appearance: She is well-developed.  Eyes:     Pupils: Pupils are equal, round, and reactive to light.  Neck:     Thyroid: No thyromegaly.     Vascular: No JVD.  Cardiovascular:     Rate and Rhythm: Normal rate and regular rhythm.     Heart sounds:    No gallop.  Pulmonary:     Effort: Pulmonary effort is normal. No respiratory distress.     Breath sounds: Normal breath sounds. No wheezing or rales.  Musculoskeletal:     Cervical back: Neck supple.  Neurological:     Mental Status: She is alert.    BP 132/82 (BP Location: Left Arm, Cuff Size: Normal)   Pulse 80   Temp 97.6 F (36.4 C) (Oral)   Ht 5' 3"  (1.6 m)   Wt 196 lb 1.6 oz (89 kg)   SpO2 98%   BMI 34.74 kg/m  Wt Readings from Last 3 Encounters:  06/19/21 196 lb 1.6 oz (89 kg)  06/04/21 190 lb (86.2 kg)  04/02/21 193 lb (87.5 kg)     Health Maintenance Due  Topic Date Due   Zoster Vaccines- Shingrix (1 of 2) Never done    There are no preventive care reminders to display for this patient.  Lab Results  Component Value Date   TSH 0.798 11/23/2020   Lab Results  Component Value Date   WBC 11.6 (H) 11/22/2020   HGB 13.0 11/22/2020   HCT 38.4 11/22/2020   MCV 83.7 11/22/2020   PLT 241 11/22/2020   Lab Results  Component Value Date   NA 138 11/22/2020  K 3.3 (L) 11/22/2020   CO2 20 (L) 11/22/2020   GLUCOSE 139 (H) 11/22/2020   BUN 14 11/22/2020   CREATININE 0.89 11/22/2020   BILITOT 0.4 11/22/2020   ALKPHOS 85 11/22/2020   AST 16 11/22/2020   ALT 21 11/22/2020   PROT 7.0 11/22/2020   ALBUMIN 4.2 11/22/2020   CALCIUM 9.1 11/22/2020   ANIONGAP 11 11/22/2020    GFR 81.27 03/14/2020   Lab Results  Component Value Date   CHOL 158 11/23/2020   Lab Results  Component Value Date   HDL 69 11/23/2020   Lab Results  Component Value Date   LDLCALC 77 11/23/2020   Lab Results  Component Value Date   TRIG 56 11/23/2020   Lab Results  Component Value Date   CHOLHDL 2.3 11/23/2020   Lab Results  Component Value Date   HGBA1C 6.0 (H) 11/23/2020      Assessment & Plan:   Problem List Items Addressed This Visit       Unprioritized   Hypothyroidism   Mixed hyperlipidemia   Attention deficit disorder   Essential hypertension - Primary   Other Visit Diagnoses     Need for immunization against influenza       Relevant Orders   Flu Vaccine QUAD High Dose(Fluad) (Completed)     -Flu vaccine given -We did discuss Shingrix vaccine and she will check on coverage for that and consider getting at pharmacy -We strongly encouraged her to continue with weight loss efforts.  No orders of the defined types were placed in this encounter.   Follow-up: No follow-ups on file.    Carolann Littler, MD

## 2021-06-19 NOTE — Patient Instructions (Signed)
Consider Shingrix vaccine and check at pharmacy if interested.   

## 2021-07-03 ENCOUNTER — Telehealth: Payer: Self-pay | Admitting: Family Medicine

## 2021-07-03 NOTE — Telephone Encounter (Signed)
PT called to advise that she needs a refill of her amphetamine-dextroamphetamine (ADDERALL XR) 30 MG 24 hr capsule but as discuss with Dr.Burchette last time she wants to get the ADDERALL instead of the ADDERALL XR. Please advise and send to the [facility] on file.

## 2021-07-03 NOTE — Telephone Encounter (Signed)
Dr. Elease Hashimoto patient.  Last office visit- 06/19/21.  Please advise

## 2021-07-04 ENCOUNTER — Ambulatory Visit (INDEPENDENT_AMBULATORY_CARE_PROVIDER_SITE_OTHER): Payer: Medicare Other | Admitting: Adult Health

## 2021-07-04 ENCOUNTER — Encounter (INDEPENDENT_AMBULATORY_CARE_PROVIDER_SITE_OTHER): Payer: Self-pay | Admitting: Adult Health

## 2021-07-04 ENCOUNTER — Other Ambulatory Visit: Payer: Self-pay | Admitting: Adult Health

## 2021-07-04 ENCOUNTER — Other Ambulatory Visit: Payer: Self-pay

## 2021-07-04 VITALS — BP 129/84 | HR 81 | Temp 98.0°F | Ht 63.0 in | Wt 195.0 lb

## 2021-07-04 DIAGNOSIS — R7303 Prediabetes: Secondary | ICD-10-CM | POA: Diagnosis not present

## 2021-07-04 DIAGNOSIS — Z6837 Body mass index (BMI) 37.0-37.9, adult: Secondary | ICD-10-CM

## 2021-07-04 DIAGNOSIS — F908 Attention-deficit hyperactivity disorder, other type: Secondary | ICD-10-CM | POA: Diagnosis not present

## 2021-07-04 MED ORDER — AMPHETAMINE-DEXTROAMPHETAMINE 30 MG PO TABS: 30.0000 mg | ORAL_TABLET | Freq: Every day | ORAL | 0 refills | Status: DC

## 2021-07-04 NOTE — Telephone Encounter (Signed)
Noted, [facility] will notify patient when prescription is ready for pickup.

## 2021-07-04 NOTE — Progress Notes (Signed)
Chief Complaint:   OBESITY Tiffany Montes is here to discuss her progress with her obesity treatment plan along with follow-up of her obesity related diagnoses. Tiffany Montes is on practicing portion control and making smarter food choices, such as increasing vegetables and decreasing simple carbohydrates and states she is following her eating plan approximately 30% of the time. Tiffany Montes states she is doing yard work for 60 minutes 3 times per week.  Today's visit was #: 11 Starting weight: 214 lbs Starting date: 11/23/2020 Today's weight: 195 lbs Today's date: 07/04/2021 Total lbs lost to date: 19 lbs Total lbs lost since last in-office visit: 0  Interim History: Tiffany Montes has had company for 1 week.   She has been consuming more foods off plan - pizza. She feels that she has been very loosely following PC/ eating plan, approximately 30%.  Of note:  She was diagnosed with breast cancer in 2019.  Subjective:   1. Attention deficit hyperactivity disorder (ADHD), other type PCP managing ADHD.  She has been on stimulant therapy for over 30 years. She is on Adderall 30 mg daily.  PCP is planning on titrating her prescription down.  2. Prediabetes On 11/23/2020, A1c is 6.0. She is not on any blood glucose lowering medications.  Assessment/Plan:   1. Attention deficit hyperactivity disorder (ADHD), other type Follow-up with PCP.  2. Prediabetes Decrease protein, remain active, check labs this fall. Tiffany Montes will continue to work on weight loss, exercise, and decreasing simple carbohydrates to help decrease the risk of diabetes.   3. Obesity with current BMI of 34.6  Tiffany Montes is currently in the action stage of change. As such, her goal is to continue with weight loss efforts. She has agreed to practicing portion control and making smarter food choices, such as increasing vegetables and decreasing simple carbohydrates.   Handouts:  Recipe Guide II, Snack Ideas, Eating Out  Guide.  Exercise goals:  As is.  Behavioral modification strategies: increasing lean protein intake, decreasing simple carbohydrates, decreasing eating out, meal planning and cooking strategies, keeping healthy foods in the home, better snacking choices, and planning for success.  Tiffany Montes has agreed to follow-up with our clinic in 3 weeks. She was informed of the importance of frequent follow-up visits to maximize her success with intensive lifestyle modifications for her multiple health conditions.   Objective:   Blood pressure 129/84, pulse 81, temperature 98 F (36.7 C), height 5\' 3"  (1.6 m), weight 195 lb (88.5 kg), SpO2 98 %. Body mass index is 34.54 kg/m.  General: Cooperative, alert, well developed, in no acute distress. HEENT: Conjunctivae and lids unremarkable. Cardiovascular: Regular rhythm.  Lungs: Normal work of breathing. Neurologic: No focal deficits.   Lab Results  Component Value Date   CREATININE 0.89 11/22/2020   BUN 14 11/22/2020   NA 138 11/22/2020   K 3.3 (L) 11/22/2020   CL 107 11/22/2020   CO2 20 (L) 11/22/2020   Lab Results  Component Value Date   ALT 21 11/22/2020   AST 16 11/22/2020   ALKPHOS 85 11/22/2020   BILITOT 0.4 11/22/2020   Lab Results  Component Value Date   HGBA1C 6.0 (H) 11/23/2020   HGBA1C 6.1 01/18/2020   HGBA1C 6.2 04/19/2019   HGBA1C 6.0 01/28/2018   Lab Results  Component Value Date   INSULIN 31.2 (H) 11/23/2020   Lab Results  Component Value Date   TSH 0.798 11/23/2020   Lab Results  Component Value Date   CHOL 158 11/23/2020   HDL  69 11/23/2020   LDLCALC 77 11/23/2020   LDLDIRECT 136.7 09/15/2012   TRIG 56 11/23/2020   CHOLHDL 2.3 11/23/2020   Lab Results  Component Value Date   VD25OH 53.1 11/23/2020   Lab Results  Component Value Date   WBC 11.6 (H) 11/22/2020   HGB 13.0 11/22/2020   HCT 38.4 11/22/2020   MCV 83.7 11/22/2020   PLT 241 11/22/2020   Lab Results  Component Value Date   FERRITIN 48  11/23/2020   Attestation Statements:   Reviewed by clinician on day of visit: allergies, medications, problem list, medical history, surgical history, family history, social history, and previous encounter notes.  Time spent on visit including pre-visit chart review and post-visit care and charting was 30 minutes.   I, Water quality scientist, CMA, am acting as Location manager for Mina Marble, NP.  I have reviewed the above documentation for accuracy and completeness, and I agree with the above. -  Vannary Greening d. Jull Harral, NP-C

## 2021-07-11 ENCOUNTER — Other Ambulatory Visit: Payer: Self-pay | Admitting: Family Medicine

## 2021-07-23 ENCOUNTER — Other Ambulatory Visit: Payer: Self-pay | Admitting: Oncology

## 2021-07-23 DIAGNOSIS — Z9889 Other specified postprocedural states: Secondary | ICD-10-CM

## 2021-07-27 ENCOUNTER — Other Ambulatory Visit: Payer: Self-pay | Admitting: Family Medicine

## 2021-07-30 ENCOUNTER — Ambulatory Visit (INDEPENDENT_AMBULATORY_CARE_PROVIDER_SITE_OTHER): Payer: Medicare Other | Admitting: Family Medicine

## 2021-07-31 ENCOUNTER — Encounter: Payer: Self-pay | Admitting: Oncology

## 2021-07-31 NOTE — Telephone Encounter (Signed)
No entry 

## 2021-08-01 ENCOUNTER — Telehealth: Payer: Self-pay | Admitting: Family Medicine

## 2021-08-01 NOTE — Telephone Encounter (Signed)
Patient is requesting a refill for amphetamine-dextroamphetamine (ADDERALL) 30 MG tablet [[id number]]  to be sent to her pharmacy.  Patient could be contacted at [phone].  Please advise.

## 2021-08-01 NOTE — Telephone Encounter (Signed)
Last filled 07/04/2021 Last OV 07/04/2021  Ok to fill?

## 2021-08-03 MED ORDER — AMPHETAMINE-DEXTROAMPHETAMINE 30 MG PO TABS: 30.0000 mg | ORAL_TABLET | Freq: Every day | ORAL | 0 refills | Status: DC

## 2021-08-03 NOTE — Telephone Encounter (Signed)
Clarify that she is only taking this once daily.  That is how her Rx is listed and that is how I sent in in.

## 2021-08-03 NOTE — Telephone Encounter (Signed)
Spoke with the patient. She is aware her Rx has been sent in.

## 2021-08-06 NOTE — Progress Notes (Signed)
[place]  [phone] [phone]    ID: Tiffany Montes DOB: 07/26/1956  MR#: [id number]  TMH#:[id number]  Patient Care Team: Eulas Post, MD as PCP - General Samaj Wessells, Virgie Dad, MD as Consulting Physician (Oncology) Kyung Rudd, MD as Consulting Physician (Radiation Oncology) Maisie Fus, MD as Consulting Physician (Obstetrics and Gynecology) Larey Dresser, MD as Consulting Physician (Cardiology) OTHER MD:    CHIEF COMPLAINT: Estrogen receptor positive breast cancer, HER-2 amplified  CURRENT TREATMENT: tamoxifen   INTERVAL HISTORY: Tiffany Montes returns today for follow-up of her estrogen receptor positive and HER2 amplified breast cancer.    She was switched to tamoxifen at her last visit on 11/22/2020.  She is having some hot flashes and mild vaginal wetness but no significant side effects from that medication.  She is scheduled for annual mammography on 08/15/2021.   REVIEW OF SYSTEMS: Tiffany Montes is walking a couple times a week she says with one of her neighbors but not otherwise exercising.  She feels tired all the time and wonders if she might have sleep apnea or hypothyroidism.  She tells me she needs to get back with her primary doctor to evaluate that.  A detailed review of systems today was otherwise stable   COVID 19 VACCINATION STATUS: Status post Pfizer x2 with booster August 28, 2020   HISTORY OF CURRENT ILLNESS: From the original intake note:  "Tiffany Montes" had routine screening mammography on 07/22/2018 showing a possible abnormality in the left breast. She underwent unilateral diagnostic mammography with tomography and left breast ultrasonography at [facility] on 07/28/2018 showing: Breast density Category C; a 7 x 8 x 8 mm hypoechoic mass in the left breast lower inner quadrant, consistent with a complex cyst.. No abnormal lymph nodes in the left axilla.  Accordingly on 07/31/2018 she proceeded to biopsy of the left  breast area in question. The pathology from this procedure showed ([id number]): Invasive Ductal Carcinoma, Grade 3. Prognostic indicators significant for: estrogen receptor, 95% positive and progesterone receptor, 85% positive, both with strong staining intensity. Proliferation marker Ki67 at 75%. HER2 positive by immunohistochemistry, 3+.   The patient's subsequent history is as detailed below.   PAST MEDICAL HISTORY: Past Medical History:  Diagnosis Date   ADHD    Anxiety    Attention deficit disorder    Back pain    Breast cancer ([place]) 2019   Left Breast Cancer   Depression    Gallbladder problem    Gallstones 09/05/2011   Hyperlipidemia    Hypertension    Hypothyroidism    Joint pain    Migraines    Neuropathy    Personal history of chemotherapy 2019   Left Breast Cancer   Personal history of radiation therapy 2019   Left Breast Cancer   PONV (postoperative nausea and vomiting)    diffficulty waking up   Thyroid disease    hypothyroid    PAST SURGICAL HISTORY: Past Surgical History:  Procedure Laterality Date   ABDOMINAL HYSTERECTOMY  2003   BREAST BIOPSY Left 2014   benign   BREAST LUMPECTOMY Left 09/07/2018   BREAST LUMPECTOMY WITH RADIOACTIVE SEED AND SENTINEL LYMPH NODE BIOPSY Left 09/07/2018   Procedure: LEFT BREAST LUMPECTOMY WITH RADIOACTIVE SEED AND AXILLARY SENTINEL LYMPH NODE BIOPSY,INJECT BLUE DYE LEFT BREAST;  Surgeon: Fanny Skates, MD;  Location: [facility];  Service: General;  Laterality: Left;   CHOLECYSTECTOMY  09/26/2011   Procedure: LAPAROSCOPIC CHOLECYSTECTOMY WITH INTRAOPERATIVE CHOLANGIOGRAM;  Surgeon: Haywood Lasso, MD;  Location: [facility];  Service: General;  Laterality: N/A;   COLONOSCOPY     CYSTIC HYGROMA EXCISION     DENTAL SURGERY     skin graft from top of mouth to lower gums   PORT-A-CATH REMOVAL Right 11/04/2019   Procedure: REMOVAL PORT-A-CATH;  Surgeon: Donnie Mesa, MD;  Location: [facility];  Service: General;   Laterality: Right;   PORTACATH PLACEMENT N/A 09/07/2018   Procedure: INSERTION PORT-A-CATH WITH [place];  Surgeon: Fanny Skates, MD;  Location: [facility];  Service: General;  Laterality: N/A;   REPLACEMENT TOTAL KNEE Right     FAMILY HISTORY: Family History  Problem Relation Age of Onset   Diabetes Father    Heart disease Father    Lung cancer Father    Hypertension Father    Hyperlipidemia Father    Thyroid disease Father    Alcoholism Father    Hypertension Mother    Thyroid disease Mother    Breast cancer Neg Hx    Ovarian cancer Neg Hx   She notes that her father died from CHF at age 67.  He was diagnosed with lung cancer at age 60. Patients' mother is 60 years old as of November 2019. The patient has 1 brother and 2 sisters. Patient denies a family history of ovarian or breast cancer.   GYNECOLOGIC HISTORY:  No LMP recorded. Patient has had a hysterectomy. 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 She has been unable to work there more recently for a variety of medical issues.  She is divorced and lives by herself with her 2 dogs. Her daughter Tiffany Montes is a Dealer in [place], [name].  The patient's son, Tiffany Montes is a Training and development officer and lives in [place]. The patient has 2 grandchildren and 1 great-grand child.  She is not a church attender   ADVANCED DIRECTIVES: Her [place] is her daughter, Tiffany Montes, [phone].     HEALTH MAINTENANCE: Social History   Tobacco Use   Smoking status: Never   Smokeless tobacco: Never  Vaping Use   Vaping Use: Never used  Substance Use Topics   Alcohol use: Yes    Comment: 1-2 a week and reports not every week   Drug use: No    Colonoscopy: 05/2019, Dr. Earlean Shawl, 1  adenoma  PAP: 1 month ago  Bone density: Yes, 2 years ago   Allergies  Allergen Reactions   Morphine Sulfate Nausea And Vomiting and Rash    GI upset    Current Outpatient Medications  Medication Sig Dispense Refill   Accu-Chek FastClix Lancets MISC Use to check blood sugar BID, DX:R73.03 100 each 0   ACCU-CHEK GUIDE test strip USE TO CHECK BLOOD SUGAR TWICE A DAY , DX:R73.03*NOT COVERED 100 strip 0   albuterol (VENTOLIN HFA) 108 (90 Base) MCG/ACT inhaler Inhale 1-2 puffs into the lungs every 4 (four) hours as needed for wheezing or shortness of breath. 8 g 0   amphetamine-dextroamphetamine (ADDERALL) 30 MG tablet Take 1 tablet by mouth daily. 30 tablet 0   Blood Glucose Monitoring Suppl (ACCU-CHEK AVIVA PLUS) w/Device KIT Use to check blood sugar BID, DX:R73.03 1 kit 0   buPROPion (WELLBUTRIN XL) 300 MG 24 hr tablet TAKE ONE TABLET  BY MOUTH ONE TIME DAILY 90 tablet 0   clonazePAM (KLONOPIN) 0.5 MG tablet Take 1 tablet (0.5 mg total) by mouth at bedtime as needed. 30 tablet 5   fluticasone (FLONASE) 50 MCG/ACT nasal spray USE 2 SPRAYS IN EACH NOSTRIL DAILY AS NEEDED FOR ALLERGIES 48 mL 1   gabapentin (NEURONTIN) 300 MG capsule Take 1 capsule (300 mg total) by mouth at bedtime. 90 capsule 3   levothyroxine (SYNTHROID) 150 MCG tablet TAKE 1 TABLET BY MOUTH EVERY DAY(RECHECK LAB IN 3 MONTHS) 90 tablet 3   losartan-hydrochlorothiazide (HYZAAR) 100-12.5 MG tablet TAKE 1 TABLET BY MOUTH EVERY DAY 90 tablet 1   rosuvastatin (CRESTOR) 20 MG tablet TAKE 1 TABLET BY MOUTH EVERY DAY 90 tablet 0   sertraline (ZOLOFT) 100 MG tablet TAKE 1 TABLET BY MOUTH EVERY DAY 90 tablet 1   SUMAtriptan (IMITREX) 100 MG tablet TAKE 1 TABLET BY MOUTH AS NEEDED FOR MIGRAINE *MAY REPEAT IN 24 HOURS AS DIRECTED 12 tablet 1   TURMERIC-GINGER PO Take by mouth.     Vitamin D-Vitamin K (VITAMIN K2-VITAMIN D3 PO) Take 5,000 Units by mouth.     No current facility-administered medications for this visit.    OBJECTIVE:  White woman who appears stated age  54:   08/07/21 1500  BP: 135/76  Pulse: 90  Resp: 16  Temp: 97.9 F (36.6 C)  SpO2: 98%     Body mass index is 36.21 kg/m.   Wt Readings from Last 3 Encounters:  08/07/21 204 lb 6.4 oz (92.7 kg)  07/04/21 195 lb (88.5 kg)  06/19/21 196 lb 1.6 oz (89 kg)  ECOG FS:1  Sclerae unicteric, EOMs intact Wearing a mask No cervical or supraclavicular adenopathy Lungs no rales or rhonchi Heart regular rate and rhythm Abd soft, obese, nontender, positive bowel sounds MSK no focal spinal tenderness, no upper extremity lymphedema Neuro: nonfocal, well oriented, appropriate affect Breasts: The right breast is unremarkable.  On the left breast is status postlumpectomy and radiation with no evidence of local recurrence.  Both axillae are benign.   LAB RESULTS:  CMP     Component Value Date/Time   NA 138 11/22/2020 1416   K 3.3 (L) 11/22/2020 1416   CL 107 11/22/2020 1416   CO2 20 (L) 11/22/2020 1416   GLUCOSE 139 (H) 11/22/2020 1416   BUN 14 11/22/2020 1416   CREATININE 0.89 11/22/2020 1416   CREATININE 0.86 01/22/2019 0915   CALCIUM 9.1 11/22/2020 1416   PROT 7.0 11/22/2020 1416   ALBUMIN 4.2 11/22/2020 1416   AST 16 11/22/2020 1416   AST 20 01/22/2019 0915   ALT 21 11/22/2020 1416   ALT 22 01/22/2019 0915   ALKPHOS 85 11/22/2020 1416   BILITOT 0.4 11/22/2020 1416   BILITOT 0.3 01/22/2019 0915   GFRNONAA >60 11/22/2020 1416   GFRNONAA >60 01/22/2019 0915   GFRAA >60 04/26/2020 1323   GFRAA >60 01/22/2019 0915    Lab Results  Component Value Date   WBC 4.6 08/07/2021   NEUTROABS 2.8 08/07/2021   HGB 13.4 08/07/2021   HCT 40.0 08/07/2021   MCV 88.5 08/07/2021   PLT 179 08/07/2021   No results found for: LABCA2  No components found for: WIOMBT597  No results for input(s): INR in the last 168 hours.  No results found for: LABCA2  No results found for: CBU384  No results found for: TXM468  No results found for:  EHO122  No results found for: CA2729  No components found  for: HGQUANT  No results found for: CEA1 / No results found for: CEA1   No results found for: AFPTUMOR  No results found for: CHROMOGRNA  No results found for: TOTALPROTELP, ALBUMINELP, A1GS, A2GS, BETS, BETA2SER, GAMS, MSPIKE, SPEI (this displays SPEP labs)  No results found for: KPAFRELGTCHN, LAMBDASER, KAPLAMBRATIO (kappa/lambda light chains)  No results found for: HGBA, HGBA2QUANT, HGBFQUANT, HGBSQUAN (Hemoglobinopathy evaluation)   No results found for: LDH  No results found for: IRON, TIBC, IRONPCTSAT (Iron and TIBC)  Lab Results  Component Value Date   FERRITIN 48 11/23/2020    Urinalysis    Component Value Date/Time   COLORURINE YELLOW 09/13/2011 [place] 09/13/2011 0952   LABSPEC 1.010 09/13/2011 0952   PHURINE 6.5 09/13/2011 0952   GLUCOSEU NEGATIVE 09/13/2011 [place] 09/13/2011 0952   HGBUR negative 04/03/2009 0949   BILIRUBINUR n 09/15/2012 0832   KETONESUR NEGATIVE 09/13/2011 0952   PROTEINUR n 09/15/2012 0832   PROTEINUR NEGATIVE 09/13/2011 0952   UROBILINOGEN 0.2 09/15/2012 0832   UROBILINOGEN 0.2 09/13/2011 0952   NITRITE n 09/15/2012 0832   NITRITE NEGATIVE 09/13/2011 0952   LEUKOCYTESUR Negative 09/15/2012 0832    STUDIES:  No results found.   ELIGIBLE FOR AVAILABLE RESEARCH PROTOCOL: no   ASSESSMENT: 65 y.o. [place] woman status post left breast lower inner quadrant biopsy 07/31/2018 for a clinical T1b N0, stage IA invasive ductal carcinoma, grade 3, triple positive, with an MIB-1 of 75%.  (1) status post left lumpectomy and sentinel lymph node sampling 09/07/2018 for a pT1c pN0, stage IA invasive ductal carcinoma, grade 3, with negative margins.  (a) a total of 5 sentinel lymph nodes were removed  (2) adjuvant chemotherapy consisting of paclitaxel weekly x12 with trastuzumab every 21 days starting 10/02/2018, completed 12/25/2018  (3)  trastuzumab continued to complete 1 year (through December 2020).  (a) echocardiogram 08/17/2018 shows an ejection fraction of 60-65%  (b) echocardiogram on 11/17/2018 shows an ejection fraction of 60-65%  (c) echo 02/08/2019 shows an ejection fraction in the 60-65%  (d) Echo in 05/2019 shows EF of 60-65%  (e) Echo on 09/16/2019 shows EF of 60-65%  (4) adjuvant radiation 02/04/2019 - 03/04/2019  (a) Left breast treated to 42.56 Gy in 16 fractions  (b) Seroma boost of 8 Gy in 4 fractions, for a total of 50.56 Gy  (5) started anastrozole 04/11/2019, discontinued February 2022 with possible neuropathy  (a) bone density scan 11/05/2019 showed a T score of -0.3, which is normal  (b) tamoxifen started February 2022   PLAN: Tiffany Montes is coming up on 3 years out from definitive surgery for her breast cancer with no evidence of disease recurrence.  This is very favorable.  She is tolerating tamoxifen well and the plan is to continue that a minimum of 5 years which will take her to July 2025.  We discussed the difference between diagnostic and screening mammography as she understands that the current standard is to switch to screening after 2 years so that is likely what she is scheduled for this year.  I have encouraged her to pay attention to her diet and to exercise more regularly.  Normally we would see her again late November after her mammogram next year but she tells me she would like to come in 6 months and I am glad to accommodate that for her  Total encounter time 25 minutes.Sarajane Jews C. Mandrell Vangilder, MD 08/07/21 3:15 PM Medical Oncology and Hematology [facility] [place], [place] [address] Tel. [phone]    Fax. [phone]    I, Wilburn Mylar, am acting as scribe for Dr. Virgie Dad. Lorree Millar.  I, Lurline Del MD, have reviewed the above documentation for accuracy and completeness, and I agree with the above.   *Total Encounter Time as defined  by the Centers for Medicare and Medicaid Services includes, in addition to the face-to-face time of a patient visit (documented in the note above) non-face-to-face time: obtaining and reviewing outside history, ordering and reviewing medications, tests or procedures, care coordination (communications with other health care professionals or caregivers) and documentation in the medical record.

## 2021-08-07 ENCOUNTER — Other Ambulatory Visit: Payer: Self-pay

## 2021-08-07 ENCOUNTER — Inpatient Hospital Stay: Payer: Medicare Other

## 2021-08-07 ENCOUNTER — Inpatient Hospital Stay: Payer: Medicare Other | Attending: Oncology | Admitting: Oncology

## 2021-08-07 VITALS — BP 135/76 | HR 90 | Temp 97.9°F | Resp 16 | Ht 63.0 in | Wt 204.4 lb

## 2021-08-07 DIAGNOSIS — C50212 Malignant neoplasm of upper-inner quadrant of left female breast: Secondary | ICD-10-CM

## 2021-08-07 DIAGNOSIS — E785 Hyperlipidemia, unspecified: Secondary | ICD-10-CM | POA: Insufficient documentation

## 2021-08-07 DIAGNOSIS — Z170 Estrogen receptor positive status [ER+]: Secondary | ICD-10-CM

## 2021-08-07 DIAGNOSIS — Z923 Personal history of irradiation: Secondary | ICD-10-CM | POA: Insufficient documentation

## 2021-08-07 DIAGNOSIS — Z79811 Long term (current) use of aromatase inhibitors: Secondary | ICD-10-CM | POA: Diagnosis not present

## 2021-08-07 DIAGNOSIS — Z79899 Other long term (current) drug therapy: Secondary | ICD-10-CM | POA: Diagnosis not present

## 2021-08-07 DIAGNOSIS — Z9221 Personal history of antineoplastic chemotherapy: Secondary | ICD-10-CM | POA: Diagnosis not present

## 2021-08-07 DIAGNOSIS — E039 Hypothyroidism, unspecified: Secondary | ICD-10-CM | POA: Insufficient documentation

## 2021-08-07 DIAGNOSIS — I10 Essential (primary) hypertension: Secondary | ICD-10-CM | POA: Diagnosis not present

## 2021-08-07 DIAGNOSIS — C50312 Malignant neoplasm of lower-inner quadrant of left female breast: Secondary | ICD-10-CM | POA: Diagnosis not present

## 2021-08-07 DIAGNOSIS — Z801 Family history of malignant neoplasm of trachea, bronchus and lung: Secondary | ICD-10-CM | POA: Diagnosis not present

## 2021-08-07 LAB — COMPREHENSIVE METABOLIC PANEL
ALT: 14 U/L (ref 0–44)
AST: 16 U/L (ref 15–41)
Albumin: 3.8 g/dL (ref 3.5–5.0)
Alkaline Phosphatase: 61 U/L (ref 38–126)
Anion gap: 7 (ref 5–15)
BUN: 19 mg/dL (ref 8–23)
CO2: 27 mmol/L (ref 22–32)
Calcium: 8.5 mg/dL — ABNORMAL LOW (ref 8.9–10.3)
Chloride: 106 mmol/L (ref 98–111)
Creatinine, Ser: 0.8 mg/dL (ref 0.44–1.00)
GFR, Estimated: >60 mL/min (ref >60)
Glucose, Bld: 105 mg/dL — ABNORMAL HIGH (ref 70–99)
Potassium: 4.4 mmol/L (ref 3.5–5.1)
Sodium: 140 mmol/L (ref 135–145)
Total Bilirubin: 0.3 mg/dL (ref 0.3–1.2)
Total Protein: 6.4 g/dL — ABNORMAL LOW (ref 6.5–8.1)

## 2021-08-07 LAB — CBC WITH DIFFERENTIAL/PLATELET
Abs Immature Granulocytes: 0.02 10*3/uL (ref 0.00–0.07)
Basophils Absolute: 0 10*3/uL (ref 0.0–0.1)
Basophils Relative: 1 %
Eosinophils Absolute: 0.1 10*3/uL (ref 0.0–0.5)
Eosinophils Relative: 2 %
HCT: 40 % (ref 36.0–46.0)
Hemoglobin: 13.4 g/dL (ref 12.0–15.0)
Immature Granulocytes: 0 %
Lymphocytes Relative: 29 %
Lymphs Abs: 1.3 10*3/uL (ref 0.7–4.0)
MCH: 29.6 pg (ref 26.0–34.0)
MCHC: 33.5 g/dL (ref 30.0–36.0)
MCV: 88.5 fL (ref 80.0–100.0)
Monocytes Absolute: 0.4 10*3/uL (ref 0.1–1.0)
Monocytes Relative: 8 %
Neutro Abs: 2.8 10*3/uL (ref 1.7–7.7)
Neutrophils Relative %: 60 %
Platelets: 179 10*3/uL (ref 150–400)
RBC: 4.52 MIL/uL (ref 3.87–5.11)
RDW: 13 % (ref 11.5–15.5)
WBC: 4.6 10*3/uL (ref 4.0–10.5)
nRBC: 0 % (ref 0.0–0.2)

## 2021-08-14 ENCOUNTER — Other Ambulatory Visit: Payer: Self-pay | Admitting: Family Medicine

## 2021-08-15 ENCOUNTER — Ambulatory Visit
Admission: RE | Admit: 2021-08-15 | Discharge: 2021-08-15 | Disposition: A | Discharge status: Home / Self Care | Payer: Medicare Other | Source: Ambulatory Visit | Attending: Oncology | Admitting: Oncology

## 2021-08-15 ENCOUNTER — Other Ambulatory Visit: Payer: Self-pay | Admitting: Oncology

## 2021-08-15 ENCOUNTER — Other Ambulatory Visit: Payer: Self-pay

## 2021-08-15 DIAGNOSIS — Z9889 Other specified postprocedural states: Secondary | ICD-10-CM

## 2021-08-16 ENCOUNTER — Other Ambulatory Visit: Payer: Self-pay | Admitting: Oncology

## 2021-08-16 DIAGNOSIS — R928 Other abnormal and inconclusive findings on diagnostic imaging of breast: Secondary | ICD-10-CM

## 2021-08-20 ENCOUNTER — Ambulatory Visit (INDEPENDENT_AMBULATORY_CARE_PROVIDER_SITE_OTHER): Payer: Medicare Other | Admitting: Adult Health

## 2021-08-21 ENCOUNTER — Other Ambulatory Visit: Payer: Self-pay

## 2021-08-21 ENCOUNTER — Other Ambulatory Visit: Payer: Self-pay | Admitting: Oncology

## 2021-08-21 ENCOUNTER — Ambulatory Visit
Admission: RE | Admit: 2021-08-21 | Discharge: 2021-08-21 | Disposition: A | Discharge status: Home / Self Care | Payer: Medicare Other | Source: Ambulatory Visit | Attending: Oncology | Admitting: Oncology

## 2021-08-21 DIAGNOSIS — R928 Other abnormal and inconclusive findings on diagnostic imaging of breast: Secondary | ICD-10-CM

## 2021-08-22 ENCOUNTER — Telehealth: Payer: Self-pay | Admitting: Family Medicine

## 2021-08-22 DIAGNOSIS — E039 Hypothyroidism, unspecified: Secondary | ICD-10-CM

## 2021-08-22 NOTE — Telephone Encounter (Signed)
Labs ordered. Spoke with the patient and she is aware of Dr. Erick Blinks message. Lab appointment scheduled.

## 2021-08-22 NOTE — Telephone Encounter (Signed)
Patient called to see if she could get lab orders to have thyroid checked. Patient states she is very tired lately and feels it may be her thyroid levels being off that caused it. Patient refused appointment and wanted message to be sent back first. She is open to OV if that it what Dr.Burchette wants.     Good callback number is [phone]    Please advise

## 2021-08-23 ENCOUNTER — Other Ambulatory Visit (INDEPENDENT_AMBULATORY_CARE_PROVIDER_SITE_OTHER): Payer: Medicare Other

## 2021-08-23 DIAGNOSIS — E039 Hypothyroidism, unspecified: Secondary | ICD-10-CM | POA: Diagnosis not present

## 2021-08-23 LAB — TSH: TSH: 2.86 u[IU]/mL (ref 0.35–5.50)

## 2021-08-29 ENCOUNTER — Telehealth: Payer: Self-pay | Admitting: Family Medicine

## 2021-08-29 MED ORDER — AMPHETAMINE-DEXTROAMPHETAMINE 30 MG PO TABS: 30.0000 mg | ORAL_TABLET | Freq: Every day | ORAL | 0 refills | Status: DC

## 2021-08-29 NOTE — Telephone Encounter (Signed)
Last filled 08/03/2021 Last OV 07/04/2021  Ok to fill?

## 2021-08-29 NOTE — Telephone Encounter (Signed)
Patient called to get refill on amphetamine-dextroamphetamine (ADDERALL) 30 MG tablet  Patient is calling so refill can be sent in before we close for Thanksgiving. Insurance does not cover it so patient will pay for it herself.    Please send to  [id number] [place], [place] - [address] AT [address] Phone:  [phone]  Fax:  [phone]       Good callback number is [phone]    Please advise

## 2021-09-19 ENCOUNTER — Other Ambulatory Visit: Payer: Self-pay | Admitting: Family Medicine

## 2021-09-27 ENCOUNTER — Telehealth: Payer: Self-pay | Admitting: Family Medicine

## 2021-09-27 NOTE — Telephone Encounter (Signed)
Pt is calling and needs a refill on amphetamine-dextroamphetamine (ADDERALL) 30 MG tablet .  Pt also would like to back on alprazolam 0.5 mg. Pt does not like clonazepam  [id number] [place] [place] - [address] AT [address] [place] Phone:  [phone]  Fax:  [phone]

## 2021-09-27 NOTE — Telephone Encounter (Signed)
Last filled 08/29/2021. 3 prescriptions were sent in. Spoke with he patient and she is aware.   Please advise on alprazolam

## 2021-09-28 ENCOUNTER — Other Ambulatory Visit: Payer: Self-pay | Admitting: Family Medicine

## 2021-09-28 NOTE — Telephone Encounter (Signed)
Spoke with the patient. She would like to switch back to alprazolam due to clonazepam makes her feel very groggy.

## 2021-09-28 NOTE — Telephone Encounter (Signed)
Left message for patient to call back  

## 2021-10-05 NOTE — Telephone Encounter (Signed)
ATC,voicemail is full °

## 2021-10-06 ENCOUNTER — Other Ambulatory Visit (INDEPENDENT_AMBULATORY_CARE_PROVIDER_SITE_OTHER): Payer: Self-pay | Admitting: Family Medicine

## 2021-10-06 ENCOUNTER — Other Ambulatory Visit: Payer: Self-pay | Admitting: Family Medicine

## 2021-10-06 DIAGNOSIS — R059 Cough, unspecified: Secondary | ICD-10-CM

## 2021-10-09 NOTE — Telephone Encounter (Signed)
ATC, voicemail is full. Unable to reach the patient. Message will be closed.

## 2021-10-10 ENCOUNTER — Other Ambulatory Visit: Payer: Self-pay | Admitting: Family Medicine

## 2021-10-24 ENCOUNTER — Other Ambulatory Visit: Payer: Self-pay | Admitting: Family Medicine

## 2021-10-24 NOTE — Telephone Encounter (Signed)
Last filled 10/09/2021 Last OV 07/04/2021  Ok to fill?

## 2021-10-25 ENCOUNTER — Other Ambulatory Visit: Payer: Self-pay | Admitting: Family Medicine

## 2021-10-25 NOTE — Telephone Encounter (Signed)
Last filled 10/09/2021 Last OV 07/04/2021  Ok to fill?

## 2021-10-30 ENCOUNTER — Telehealth (INDEPENDENT_AMBULATORY_CARE_PROVIDER_SITE_OTHER): Payer: Medicare Other | Admitting: Family Medicine

## 2021-10-30 DIAGNOSIS — F419 Anxiety disorder, unspecified: Secondary | ICD-10-CM

## 2021-10-30 DIAGNOSIS — F5104 Psychophysiologic insomnia: Secondary | ICD-10-CM | POA: Diagnosis not present

## 2021-10-30 MED ORDER — BUPROPION HCL ER (XL) 150 MG PO TB24: 150.0000 mg | ORAL_TABLET | Freq: Every day | ORAL | 3 refills | Status: DC

## 2021-10-30 MED ORDER — ALPRAZOLAM 0.5 MG PO TABS: 0.5000 mg | ORAL_TABLET | Freq: Every evening | ORAL | 2 refills | Status: DC | PRN

## 2021-10-30 NOTE — Progress Notes (Signed)
Patient ID: Tiffany Montes, female   DOB: [birth year], 66 y.o.   MRN: [id number]   This visit type was conducted due to national recommendations for restrictions regarding the COVID-19 pandemic in an effort to limit this patient's exposure and mitigate transmission in our community.   Virtual Visit via Telephone Note  I connected with Tiffany Montes on 10/30/21 at  3:15 PM EST by telephone and verified that I am speaking with the correct person using two identifiers.   I discussed the limitations, risks, security and privacy concerns of performing an evaluation and management service by telephone and the availability of in person appointments. I also discussed with the patient that there may be a patient responsible charge related to this service. The patient expressed understanding and agreed to proceed.  Location patient: home Location provider: work or home office Participants present for the call: patient, provider Patient did not have a visit in the prior 7 days to address this/these issue(s).   History of Present Illness:  Tiffany Montes calls regarding concerns with her chronic insomnia.  She has been on Klonopin but recently had difficulty getting prescription filled.  Since she is retired she has been taking Klonopin at night but feels like she was too groggy the next morning.  Years ago she had taken low-dose alprazolam at night and felt like that worked better.  She is also on gabapentin at night which does help some with sleep.  With the gabapentin alone though she struggles tremendously with sleep.  She has tried melatonin without relief.  Has previously tried trazodone.  She also frequently feels very anxious at night.  She has chronic anxiety symptoms.  Is already on sertraline.  She has recurrent depression currently stable on sertraline and Wellbutrin.  She is on higher dose Wellbutrin as well as Adderall for ADD and is aware that these could be contributing to insomnia issues as well.   No alcohol.  No late day use of caffeine.  Past Medical History:  Diagnosis Date   ADHD    Anxiety    Attention deficit disorder    Back pain    Breast cancer ([place]) 2019   Left Breast Cancer   Depression    Gallbladder problem    Gallstones 09/05/2011   Hyperlipidemia    Hypertension    Hypothyroidism    Joint pain    Migraines    Neuropathy    Personal history of chemotherapy 2019   Left Breast Cancer   Personal history of radiation therapy 2019   Left Breast Cancer   PONV (postoperative nausea and vomiting)    diffficulty waking up   Thyroid disease    hypothyroid   Past Surgical History:  Procedure Laterality Date   ABDOMINAL HYSTERECTOMY  2003   BREAST BIOPSY Left 2014   benign   BREAST LUMPECTOMY Left 09/07/2018   BREAST LUMPECTOMY WITH RADIOACTIVE SEED AND SENTINEL LYMPH NODE BIOPSY Left 09/07/2018   Procedure: LEFT BREAST LUMPECTOMY WITH RADIOACTIVE SEED AND AXILLARY SENTINEL LYMPH NODE BIOPSY,INJECT BLUE DYE LEFT BREAST;  Surgeon: Tiffany Skates, MD;  Location: [facility];  Service: General;  Laterality: Left;   CHOLECYSTECTOMY  09/26/2011   Procedure: LAPAROSCOPIC CHOLECYSTECTOMY WITH INTRAOPERATIVE CHOLANGIOGRAM;  Surgeon: Tiffany Lasso, MD;  Location: [facility];  Service: General;  Laterality: N/A;   COLONOSCOPY     CYSTIC HYGROMA EXCISION     DENTAL SURGERY     skin graft from top of mouth to lower gums   PORT-A-CATH REMOVAL Right  11/04/2019   Procedure: REMOVAL PORT-A-CATH;  Surgeon: Tiffany Mesa, MD;  Location: [facility];  Service: General;  Laterality: Right;   PORTACATH PLACEMENT N/A 09/07/2018   Procedure: INSERTION PORT-A-CATH WITH [place];  Surgeon: Tiffany Skates, MD;  Location: [facility];  Service: General;  Laterality: N/A;   REPLACEMENT TOTAL KNEE Right     reports that she has never smoked. She has never used smokeless tobacco. She reports current alcohol use. She reports that she does not use drugs. family history includes Alcoholism in  her father; Diabetes in her father; Heart disease in her father; Hyperlipidemia in her father; Hypertension in her father and mother; Lung cancer in her father; Thyroid disease in her father and mother. Allergies  Allergen Reactions   Morphine Sulfate Nausea And Vomiting and Rash    GI upset      Observations/Objective: Patient sounds cheerful and well on the phone. I do not appreciate any SOB. Speech and thought processing are grossly intact. Patient reported vitals:  Assessment and Plan:  Chronic insomnia and chronic anxiety symptoms.  -Discussed sleep hygiene in some detail. -We discussed our preference to not take any benzodiazepine chronically but she has been on this for years.  We did agree to stopping the Klonopin and consider low-dose alprazolam 0.5 mg nightly but avoid any escalation.  We also recommended trying to reduce her Wellbutrin XL to 150 mg once daily  Follow Up Instructions:   99441 5-10 99442 11-20 99443 21-30 I did not refer this patient for an OV in the next 24 hours for this/these issue(s).  I discussed the assessment and treatment plan with the patient. The patient was provided an opportunity to ask questions and all were answered. The patient agreed with the plan and demonstrated an understanding of the instructions.   The patient was advised to call back or seek an in-person evaluation if the symptoms worsen or if the condition fails to improve as anticipated.  I provided 22 minutes of non-face-to-face time during this encounter.   Tiffany Littler, MD

## 2021-11-07 ENCOUNTER — Other Ambulatory Visit (INDEPENDENT_AMBULATORY_CARE_PROVIDER_SITE_OTHER): Payer: Self-pay | Admitting: Family Medicine

## 2021-11-07 ENCOUNTER — Other Ambulatory Visit: Payer: Self-pay | Admitting: Family Medicine

## 2021-11-07 DIAGNOSIS — R059 Cough, unspecified: Secondary | ICD-10-CM

## 2021-11-07 NOTE — Telephone Encounter (Signed)
Please advise. Rx is not on the current med list 

## 2021-11-15 ENCOUNTER — Other Ambulatory Visit: Payer: Self-pay | Admitting: Family Medicine

## 2021-12-10 ENCOUNTER — Other Ambulatory Visit: Payer: Self-pay | Admitting: Family Medicine

## 2021-12-10 MED ORDER — AMPHETAMINE-DEXTROAMPHETAMINE 30 MG PO TABS: 30.0000 mg | ORAL_TABLET | Freq: Every day | ORAL | 0 refills | Status: DC

## 2021-12-10 NOTE — Telephone Encounter (Signed)
Please refill amphetamine-dextroamphetamine (ADDERALL) 30 MG tablet ? ? ?Do not send in timed release as patient does not take that any more  ? ? ?[facility] to  ?[id number] [address] [place] - [address]:  [phone] ?Fax:  [phone] ?  ? ? ? ? ? ?Please advise  ?

## 2021-12-12 ENCOUNTER — Other Ambulatory Visit: Payer: Self-pay

## 2021-12-12 MED ORDER — TAMOXIFEN CITRATE 20 MG PO TABS: 20.0000 mg | ORAL_TABLET | Freq: Every day | ORAL | 3 refills | Status: DC

## 2022-01-24 ENCOUNTER — Other Ambulatory Visit: Payer: Self-pay

## 2022-01-24 ENCOUNTER — Other Ambulatory Visit: Payer: Self-pay | Admitting: Family Medicine

## 2022-01-24 DIAGNOSIS — E782 Mixed hyperlipidemia: Secondary | ICD-10-CM

## 2022-01-24 DIAGNOSIS — I10 Essential (primary) hypertension: Secondary | ICD-10-CM

## 2022-01-24 DIAGNOSIS — E039 Hypothyroidism, unspecified: Secondary | ICD-10-CM

## 2022-01-24 MED ORDER — ROSUVASTATIN CALCIUM 20 MG PO TABS: 20.0000 mg | ORAL_TABLET | Freq: Every day | ORAL | 0 refills | Status: DC

## 2022-01-24 MED ORDER — LOSARTAN POTASSIUM-HCTZ 100-12.5 MG PO TABS: 1.0000 | ORAL_TABLET | Freq: Every day | ORAL | 0 refills | Status: DC

## 2022-01-24 MED ORDER — SERTRALINE HCL 100 MG PO TABS: 100.0000 mg | ORAL_TABLET | Freq: Every day | ORAL | 0 refills | Status: DC

## 2022-02-04 ENCOUNTER — Other Ambulatory Visit: Payer: Self-pay

## 2022-02-04 DIAGNOSIS — Z170 Estrogen receptor positive status [ER+]: Secondary | ICD-10-CM

## 2022-02-05 ENCOUNTER — Inpatient Hospital Stay: Payer: Medicare Other | Admitting: Hematology and Oncology

## 2022-02-05 ENCOUNTER — Inpatient Hospital Stay: Payer: Medicare Other | Attending: Hematology and Oncology

## 2022-02-05 ENCOUNTER — Telehealth: Payer: Self-pay | Admitting: Family Medicine

## 2022-02-05 NOTE — Telephone Encounter (Signed)
Pt calling in regarding buPROPion (WELLBUTRIN XL) 150 MG 24 hr tablet her she would like to resume her prior dosage of '300mg'$ , she feels the new lowered dosage is not working for her.    ?[facility] # [place], [place] Phone:  [phone]  ?Fax:  [phone]  ?  ? ?

## 2022-02-06 MED ORDER — BUPROPION HCL ER (XL) 300 MG PO TB24: 300.0000 mg | ORAL_TABLET | Freq: Every day | ORAL | 1 refills | Status: DC

## 2022-02-06 NOTE — Telephone Encounter (Signed)
Rx sent 

## 2022-02-14 ENCOUNTER — Inpatient Hospital Stay: Payer: Medicare Other | Admitting: Hematology and Oncology

## 2022-02-14 ENCOUNTER — Encounter: Payer: Self-pay | Admitting: Family Medicine

## 2022-02-14 ENCOUNTER — Inpatient Hospital Stay: Payer: Medicare Other

## 2022-02-14 ENCOUNTER — Telehealth (INDEPENDENT_AMBULATORY_CARE_PROVIDER_SITE_OTHER): Payer: Medicare Other | Admitting: Family Medicine

## 2022-02-14 DIAGNOSIS — R059 Cough, unspecified: Secondary | ICD-10-CM | POA: Diagnosis not present

## 2022-02-14 DIAGNOSIS — R0981 Nasal congestion: Secondary | ICD-10-CM

## 2022-02-14 DIAGNOSIS — R519 Headache, unspecified: Secondary | ICD-10-CM

## 2022-02-14 MED ORDER — BENZONATATE 100 MG PO CAPS: ORAL_CAPSULE | ORAL | 0 refills | Status: DC

## 2022-02-14 MED ORDER — DOXYCYCLINE HYCLATE 100 MG PO TABS: 100.0000 mg | ORAL_TABLET | Freq: Two times a day (BID) | ORAL | 0 refills | Status: DC

## 2022-02-14 NOTE — Patient Instructions (Signed)
-  I sent the medication(s) we discussed to your pharmacy: ?Meds ordered this encounter  ?Medications  ? benzonatate (TESSALON PERLES) 100 MG capsule  ?  Sig: 1-2 capsules up to twice daily as needed for cough.  ?  Dispense:  30 capsule  ?  Refill:  0  ? doxycycline (VIBRA-TABS) 100 MG tablet  ?  Sig: Take 1 tablet (100 mg total) by mouth 2 (two) times daily.  ?  Dispense:  14 tablet  ?  Refill:  0  ? ?Nasal saline sinus rinses twice daily for 3-5 days.  ? ?Drink plenty of water and avoid dairy products.  ? ?I hope you are feeling better soon! ? ?Seek in person care promptly if your symptoms worsen, new concerns arise or you are not improving with treatment. ? ?It was nice to meet you today. I help [facility] out with telemedicine visits on Tuesdays and Thursdays and am happy to help if you need a virtual follow up visit on those days. Otherwise, if you have any concerns or questions following this visit please schedule a follow up visit with your Primary Care office or seek care at a local urgent care clinic to avoid delays in care. If you are having severe or life threatening symptoms please call 911 and/or go to the nearest emergency room.  ? ?

## 2022-02-14 NOTE — Progress Notes (Signed)
Virtual Visit via Telephone Note ? ?I connected with Tiffany Montes on 02/14/22 at 10:00 AM EDT by telephone and verified that I am speaking with the correct person using two identifiers. ?  ?I discussed the limitations of performing an evaluation and management service by telephone and requested permission for a phone visit. The patient expressed understanding and agreed to proceed. ? ?Location patient:  [place] ?Location provider: work or home office ?Participants present for the call: patient, provider ?Patient did not have a visit with me in the prior 7 days to address this/these issue(s). ? ? ?History of Present Illness: ? ?Acute telemedicine visit for cough and congestion: ?-Onset:2 weeks ago ?-Symptoms include: nasal congestion, PND, cough - productive, now worsening since yesterday with yellow and green sinus congestion and a terrible cough, some sinus discomfort ?-did 3 covid tests which were negative ?-Denies:fever, CP, SOB, NVD ?-Has tried:OTC cough medications - tylenol cold and musinex ?-Pertinent past medical history: see below ?-Pertinent medication allergies:  ?Allergies  ?Allergen Reactions  ? Morphine Sulfate Nausea And Vomiting and Rash  ?  GI upset  ?-COVID-19 vaccine status: ?Immunization History  ?Administered Date(s) Administered  ? Fluad Quad(high Dose 65+) 08/28/2020, 06/19/2021  ? Influenza Split 08/08/2011, 08/24/2012  ? Influenza, Seasonal, Injecte, Preservative Fre 07/26/2014  ? Influenza,inj,Quad PF,6+ Mos 06/03/2017, 08/08/2018, 08/02/2019  ? Influenza-Unspecified 07/07/2016, 08/08/2018  ? PFIZER Comirnaty(Gray Top)Covid-19 Tri-Sucrose Vaccine 03/20/2021  ? PFIZER(Purple Top)SARS-COV-2 Vaccination 12/05/2019, 12/29/2019, 08/28/2020  ? Pneumococcal Conjugate-13 09/28/2018  ? Pneumococcal Polysaccharide-23 04/19/2020  ? Td 04/14/2009  ? Tdap 04/19/2019  ? Zoster, Live 12/04/2015  ? ? ?  ?Past Medical History:  ?Diagnosis Date  ? ADHD   ? Anxiety   ? Attention deficit disorder   ? Back  pain   ? Breast cancer ([place]) 2019  ? Left Breast Cancer  ? Depression   ? Gallbladder problem   ? Gallstones 09/05/2011  ? Hyperlipidemia   ? Hypertension   ? Hypothyroidism   ? Joint pain   ? Migraines   ? Neuropathy   ? Personal history of chemotherapy 2019  ? Left Breast Cancer  ? Personal history of radiation therapy 2019  ? Left Breast Cancer  ? PONV (postoperative nausea and vomiting)   ? diffficulty waking up  ? Thyroid disease   ? hypothyroid  ? ? ?Current Outpatient Medications on File Prior to Visit  ?Medication Sig Dispense Refill  ? ALPRAZolam (XANAX) 0.5 MG tablet Take 1 tablet (0.5 mg total) by mouth at bedtime as needed for anxiety. 30 tablet 2  ? amphetamine-dextroamphetamine (ADDERALL) 30 MG tablet Take 1 tablet by mouth daily. 30 tablet 0  ? amphetamine-dextroamphetamine (ADDERALL) 30 MG tablet Take 1 tablet by mouth daily. 30 tablet 0  ? amphetamine-dextroamphetamine (ADDERALL) 30 MG tablet Take 1 tablet by mouth daily. 30 tablet 0  ? buPROPion (WELLBUTRIN XL) 300 MG 24 hr tablet Take 1 tablet (300 mg total) by mouth daily. 90 tablet 1  ? fluticasone (FLONASE) 50 MCG/ACT nasal spray USE 2 SPRAYS IN EACH NOSTRIL DAILY AS NEEDED FOR ALLERGIES 48 mL 1  ? gabapentin (NEURONTIN) 300 MG capsule Take 1 capsule (300 mg total) by mouth at bedtime. 90 capsule 3  ? levothyroxine (SYNTHROID) 150 MCG tablet TAKE 1 TABLET BY MOUTH DAILY BEFORE BREAKFAST. 90 tablet 3  ? losartan-hydrochlorothiazide (HYZAAR) 100-12.5 MG tablet Take 1 tablet by mouth daily. 90 tablet 0  ? rosuvastatin (CRESTOR) 20 MG tablet Take 1 tablet (20 mg total) by mouth daily.  90 tablet 0  ? sertraline (ZOLOFT) 100 MG tablet Take 1 tablet (100 mg total) by mouth daily. 90 tablet 0  ? SUMAtriptan (IMITREX) 100 MG tablet TAKE 1 TABLET BY MOUTH AS NEEDED FOR MIGRAINE *MAY REPEAT IN 24 HOURS AS DIRECTED 12 tablet 1  ? tamoxifen (NOLVADEX) 20 MG tablet Take 1 tablet (20 mg total) by mouth daily. 90 tablet 3  ? Vitamin D-Vitamin K (VITAMIN  K2-VITAMIN D3 PO) Take 5,000 Units by mouth.    ? [DISCONTINUED] prochlorperazine (COMPAZINE) 10 MG tablet Take 1 tablet (10 mg total) by mouth every 6 (six) hours as needed (Nausea or vomiting). 30 tablet 1  ? ?No current facility-administered medications on file prior to visit.  ? ? ?Observations/Objective: ?Patient sounds cheerful and well on the phone. ?I do not appreciate any SOB. ?Speech and thought processing are grossly intact. ?Patient reported vitals: T 97.5 ? ?Assessment and Plan: ? ?Nasal congestion ? ?Cough, unspecified type ? ?Facial discomfort ? ?-we discussed possible serious and likely etiologies, options for evaluation and workup, limitations of telemedicine visit vs in person visit, treatment, treatment risks and precautions. Pt prefers to treat via telemedicine empirically rather than in person at this moment. Opted for nasal saline sinus rinses, tessalon for cough and after discussion potential risks/proper use and ult dx she wanted to try doxy '100mg'$  bid x 7 days for a possible 2ndary bacterial resp. ?Advised to seek prompt virtual visit or in person care if worsening, new symptoms arise, or if is not improving with treatment as expected per our conversation of expected course. Discussed options for follow up care. Did let this patient know that I do telemedicine on Tuesdays and Thursdays for [facility] [place] and those are the days I am logged into the system. Advised to schedule follow up visit with PCP, [place] virtual visits or [facility] if any further questions or concerns to avoid delays in care. ?  ?I discussed the assessment and treatment plan with the patient. The patient was provided an opportunity to ask questions and all were answered. The patient agreed with the plan and demonstrated an understanding of the instructions. ?  ? ?Follow Up Instructions: ? ?I did not refer this patient for an OV with me in the next 24 hours for this/these issue(s). ? ?I discussed the assessment and treatment plan  with the patient. The patient was provided an opportunity to ask questions and all were answered. The patient agreed with the plan and demonstrated an understanding of the instructions. ? ? ?I spent 15 minutes on the date of this visit in the care of this patient. See summary of tasks completed to properly care for this patient in the detailed notes above which also included counseling of above, review of PMH, medications, allergies, evaluation of the patient and ordering and/or  instructing patient on testing and care options.   ? ? ?Lucretia Kern, DO  ? ?

## 2022-02-24 ENCOUNTER — Other Ambulatory Visit: Payer: Self-pay | Admitting: Family Medicine

## 2022-02-25 ENCOUNTER — Other Ambulatory Visit: Payer: Self-pay | Admitting: Hematology and Oncology

## 2022-02-25 ENCOUNTER — Ambulatory Visit
Admission: RE | Admit: 2022-02-25 | Discharge: 2022-02-25 | Disposition: A | Discharge status: Home / Self Care | Payer: Medicare Other | Source: Ambulatory Visit | Attending: Oncology | Admitting: Oncology

## 2022-02-25 DIAGNOSIS — R928 Other abnormal and inconclusive findings on diagnostic imaging of breast: Secondary | ICD-10-CM

## 2022-02-25 DIAGNOSIS — R921 Mammographic calcification found on diagnostic imaging of breast: Secondary | ICD-10-CM

## 2022-03-11 ENCOUNTER — Inpatient Hospital Stay: Payer: Medicare Other | Attending: Hematology and Oncology

## 2022-03-11 ENCOUNTER — Inpatient Hospital Stay: Payer: Medicare Other | Admitting: Hematology and Oncology

## 2022-03-11 DIAGNOSIS — Z79810 Long term (current) use of selective estrogen receptor modulators (SERMs): Secondary | ICD-10-CM | POA: Insufficient documentation

## 2022-03-11 DIAGNOSIS — Z170 Estrogen receptor positive status [ER+]: Secondary | ICD-10-CM | POA: Insufficient documentation

## 2022-03-11 DIAGNOSIS — Z79899 Other long term (current) drug therapy: Secondary | ICD-10-CM | POA: Insufficient documentation

## 2022-03-11 DIAGNOSIS — C50312 Malignant neoplasm of lower-inner quadrant of left female breast: Secondary | ICD-10-CM | POA: Insufficient documentation

## 2022-03-11 DIAGNOSIS — Z923 Personal history of irradiation: Secondary | ICD-10-CM | POA: Insufficient documentation

## 2022-03-11 DIAGNOSIS — Z9221 Personal history of antineoplastic chemotherapy: Secondary | ICD-10-CM | POA: Insufficient documentation

## 2022-03-12 ENCOUNTER — Other Ambulatory Visit: Payer: Self-pay | Admitting: Oncology

## 2022-03-18 ENCOUNTER — Other Ambulatory Visit: Payer: Self-pay | Admitting: Family Medicine

## 2022-03-18 ENCOUNTER — Telehealth: Payer: Self-pay | Admitting: Family Medicine

## 2022-03-18 MED ORDER — AMPHETAMINE-DEXTROAMPHETAMINE 30 MG PO TABS: 30.0000 mg | ORAL_TABLET | Freq: Every day | ORAL | 0 refills | Status: DC

## 2022-03-18 NOTE — Telephone Encounter (Signed)
Pt would like to request a refill of the amphetamine-dextroamphetamine (ADDERALL) 30 MG tablet [id number] [place] [place] [address]:  [phone] Fax:  [phone]

## 2022-03-20 ENCOUNTER — Other Ambulatory Visit: Payer: Self-pay

## 2022-03-20 NOTE — Telephone Encounter (Signed)
Pa submitted for Adderall and this has been approved by pt's insurance. Left a detailed message informing the pt of this.

## 2022-03-25 ENCOUNTER — Other Ambulatory Visit: Payer: Self-pay | Admitting: Family Medicine

## 2022-04-03 ENCOUNTER — Inpatient Hospital Stay: Payer: Medicare Other

## 2022-04-03 ENCOUNTER — Inpatient Hospital Stay (HOSPITAL_BASED_OUTPATIENT_CLINIC_OR_DEPARTMENT_OTHER): Payer: Medicare Other | Admitting: Hematology and Oncology

## 2022-04-03 ENCOUNTER — Encounter: Payer: Self-pay | Admitting: Hematology and Oncology

## 2022-04-03 ENCOUNTER — Other Ambulatory Visit: Payer: Self-pay

## 2022-04-03 VITALS — BP 133/79 | HR 81 | Temp 98.6°F | Resp 16 | Ht 63.0 in | Wt 216.7 lb

## 2022-04-03 DIAGNOSIS — Z79810 Long term (current) use of selective estrogen receptor modulators (SERMs): Secondary | ICD-10-CM | POA: Diagnosis not present

## 2022-04-03 DIAGNOSIS — C50312 Malignant neoplasm of lower-inner quadrant of left female breast: Secondary | ICD-10-CM | POA: Diagnosis present

## 2022-04-03 DIAGNOSIS — C50212 Malignant neoplasm of upper-inner quadrant of left female breast: Secondary | ICD-10-CM

## 2022-04-03 DIAGNOSIS — Z79899 Other long term (current) drug therapy: Secondary | ICD-10-CM | POA: Diagnosis not present

## 2022-04-03 DIAGNOSIS — Z923 Personal history of irradiation: Secondary | ICD-10-CM | POA: Diagnosis not present

## 2022-04-03 DIAGNOSIS — Z170 Estrogen receptor positive status [ER+]: Secondary | ICD-10-CM

## 2022-04-03 DIAGNOSIS — Z9221 Personal history of antineoplastic chemotherapy: Secondary | ICD-10-CM | POA: Diagnosis not present

## 2022-04-03 LAB — CBC WITH DIFFERENTIAL (CANCER CENTER ONLY)
Abs Immature Granulocytes: 0.01 10*3/uL (ref 0.00–0.07)
Basophils Absolute: 0.1 10*3/uL (ref 0.0–0.1)
Basophils Relative: 1 %
Eosinophils Absolute: 0.2 10*3/uL (ref 0.0–0.5)
Eosinophils Relative: 4 %
HCT: 39 % (ref 36.0–46.0)
Hemoglobin: 12.9 g/dL (ref 12.0–15.0)
Immature Granulocytes: 0 %
Lymphocytes Relative: 26 %
Lymphs Abs: 1.5 10*3/uL (ref 0.7–4.0)
MCH: 28.9 pg (ref 26.0–34.0)
MCHC: 33.1 g/dL (ref 30.0–36.0)
MCV: 87.4 fL (ref 80.0–100.0)
Monocytes Absolute: 0.4 10*3/uL (ref 0.1–1.0)
Monocytes Relative: 7 %
Neutro Abs: 3.6 10*3/uL (ref 1.7–7.7)
Neutrophils Relative %: 62 %
Platelet Count: 199 10*3/uL (ref 150–400)
RBC: 4.46 MIL/uL (ref 3.87–5.11)
RDW: 12.8 % (ref 11.5–15.5)
WBC Count: 5.8 10*3/uL (ref 4.0–10.5)
nRBC: 0 % (ref 0.0–0.2)

## 2022-04-03 LAB — CMP (CANCER CENTER ONLY)
ALT: 11 U/L (ref 0–44)
AST: 15 U/L (ref 15–41)
Albumin: 4 g/dL (ref 3.5–5.0)
Alkaline Phosphatase: 48 U/L (ref 38–126)
Anion gap: 7 (ref 5–15)
BUN: 13 mg/dL (ref 8–23)
CO2: 26 mmol/L (ref 22–32)
Calcium: 8.7 mg/dL — ABNORMAL LOW (ref 8.9–10.3)
Chloride: 108 mmol/L (ref 98–111)
Creatinine: 0.78 mg/dL (ref 0.44–1.00)
GFR, Estimated: >60 mL/min (ref >60)
Glucose, Bld: 101 mg/dL — ABNORMAL HIGH (ref 70–99)
Potassium: 3.9 mmol/L (ref 3.5–5.1)
Sodium: 141 mmol/L (ref 135–145)
Total Bilirubin: 0.3 mg/dL (ref 0.3–1.2)
Total Protein: 6.4 g/dL — ABNORMAL LOW (ref 6.5–8.1)

## 2022-04-03 NOTE — Progress Notes (Signed)
Tiffany Montes  [phone] [phone]    ID: Tiffany Montes Ring DOB: 10-09-1955  MR#: [id number]  HYI#:[id number]  Patient Care Team: Eulas Post, MD as PCP - General Magrinat, Virgie Dad, MD (Inactive) as Consulting Physician (Oncology) Kyung Rudd, MD as Consulting Physician (Radiation Oncology) Maisie Fus, MD as Consulting Physician (Obstetrics and Gynecology) Larey Dresser, MD as Consulting Physician (Cardiology) OTHER MD:    CHIEF COMPLAINT: Estrogen receptor positive breast cancer, HER-2 amplified  CURRENT TREATMENT: tamoxifen  INTERVAL HISTORY: Tiffany Montes returns today for follow-up of her estrogen receptor positive and HER2 amplified breast cancer.   She was switched to tamoxifen at her last visit on 11/22/2020.   She had a mammogram recently in May which showed some calcifications in the right breast and a 43-monthmammogram was recommended.  She states she is very worried about the interaction between Wellbutrin and tamoxifen which was mentioned to her by her pharmacist. She has not been exercising much, says she is fatigued and can do better.   No other changes in breast reported. No change in breathing, bowel habits or urinary habits reported. Rest of the pertinent 10 point ROS reviewed and negative   COVID 19 VACCINATION STATUS: Status post [place] with booster August 28, 2020   HISTORY OF CURRENT ILLNESS: From the original intake note:  "PPrecious Montes had routine screening mammography on 07/22/2018 showing a possible abnormality in the left breast. She underwent unilateral diagnostic mammography with tomography and [facility] showing: Breast density Category C; a 7 x 8 x 8 mm hypoechoic mass in the left breast lower inner quadrant, consistent with a complex cyst.. No abnormal lymph nodes in the left axilla.  Accordingly on 07/31/2018 she proceeded to biopsy of the left breast area  in question. The pathology from this procedure showed ([id number]): Invasive Ductal Carcinoma, Grade 3. Prognostic indicators significant for: estrogen receptor, 95% positive and progesterone receptor, 85% positive, both with strong staining intensity. Proliferation marker Ki67 at 75%. HER2 positive by immunohistochemistry, 3+.   The patient's subsequent history is as detailed below.   PAST MEDICAL HISTORY: Past Medical History:  Diagnosis Date   ADHD    Anxiety    Attention deficit disorder    Back pain    [place]   Left Breast Cancer   Depression    Gallbladder problem    Gallstones 09/05/2011   Hyperlipidemia    Hypertension    Hypothyroidism    Joint pain    Migraines    Neuropathy    Personal history of chemotherapy 2019   Left Breast Cancer   Personal history of radiation therapy 2019   Left Breast Cancer   PONV (postoperative nausea and vomiting)    diffficulty waking up   Thyroid disease    hypothyroid    PAST SURGICAL HISTORY: Past Surgical History:  Procedure Laterality Date   ABDOMINAL HYSTERECTOMY  2003   BREAST BIOPSY Left 2014   benign   BREAST LUMPECTOMY Left 09/07/2018   BREAST LUMPECTOMY WITH RADIOACTIVE SEED AND SENTINEL LYMPH NODE BIOPSY Left 09/07/2018   Procedure: LEFT BREAST LUMPECTOMY WITH RADIOACTIVE SEED AND AXILLARY SENTINEL LYMPH NODE BIOPSY,INJECT BLUE DYE LEFT BREAST;  Surgeon: IFanny Skates MD;  [facility]  Service: General;  Laterality: Left;   CHOLECYSTECTOMY  09/26/2011   Procedure: LAPAROSCOPIC CHOLECYSTECTOMY WITH INTRAOPERATIVE CHOLANGIOGRAM;  Surgeon: CHaywood Lasso MD;  [facility]  Service: General;  Laterality:  N/A;   COLONOSCOPY     CYSTIC HYGROMA EXCISION     DENTAL SURGERY     skin graft from top of mouth to lower gums   PORT-A-CATH REMOVAL Right 11/04/2019   Procedure: REMOVAL PORT-A-CATH;  Surgeon: Donnie Mesa, MD;  Location: [facility];  Service: General;  Laterality:  Right;   PORTACATH PLACEMENT N/A 09/07/2018   Procedure: INSERTION PORT-A-CATH WITH [place];  Surgeon: Fanny Skates, MD;  Location: [facility];  Service: General;  Laterality: N/A;   REPLACEMENT TOTAL KNEE Right     FAMILY HISTORY: Family History  Problem Relation Age of Onset   Diabetes Father    Heart disease Father    Lung cancer Father    Hypertension Father    Hyperlipidemia Father    Thyroid disease Father    Alcoholism Father    Hypertension Mother    Thyroid disease Mother    Breast cancer Neg Hx    Ovarian cancer Neg Hx   She notes that her father died from CHF at age 79.  He was diagnosed with lung cancer at age 58. Patients' mother is 58 years old as of November 2019. The patient has 1 brother and 2 sisters. Patient denies a family history of ovarian or breast cancer.   GYNECOLOGIC HISTORY:  No LMP recorded. Patient has had a hysterectomy. 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 She has been unable to work there more recently for a variety of medical issues.  She is divorced and lives by herself with her 2 dogs. Her daughter Tiffany Montes is a Dealer in [place], [name].  The patient's son, Tiffany Montes is a Training and development officer and lives in [place]. The patient has 2 grandchildren and 1 great-grand child.  She is not a church attender   ADVANCED DIRECTIVES: Her Whitman is her daughter, [name], [phone].     HEALTH MAINTENANCE: Social History   Tobacco Use   Smoking status: Never   Smokeless tobacco: Never  Vaping Use   Vaping Use: Never used  Substance Use Topics   Alcohol use: Yes    Comment: 1-2 a week and reports not every week   Drug use: No    Colonoscopy: 05/2019, Dr. Earlean Shawl, 1  adenoma  PAP: 1 month ago  Bone density: Yes, 2 years ago   Allergies  Allergen Reactions   Morphine Sulfate Nausea And Vomiting and Rash    GI upset    Current Outpatient Medications  Medication Sig Dispense Refill   ALPRAZolam (XANAX) 0.5 MG tablet TAKE 1 TABLET BY MOUTH AT BEDTIME AS NEEDED FOR ANXIETY. 30 tablet 2   amphetamine-dextroamphetamine (ADDERALL) 30 MG tablet Take 1 tablet by mouth daily. 30 tablet 0   amphetamine-dextroamphetamine (ADDERALL) 30 MG tablet Take 1 tablet by mouth daily. 30 tablet 0   amphetamine-dextroamphetamine (ADDERALL) 30 MG tablet Take 1 tablet by mouth daily. 30 tablet 0   benzonatate (TESSALON) 100 MG capsule TAKE 1 CAPSULE BY MOUTH 2 TIMES DAILY AS NEEDED FOR COUGH 40 capsule 0   buPROPion (WELLBUTRIN XL) 300 MG 24 hr tablet Take 1 tablet (300 mg total) by mouth daily. 90 tablet 1   doxycycline (VIBRA-TABS) 100 MG tablet Take 1 tablet (100 mg  total) by mouth 2 (two) times daily. 14 tablet 0   fluticasone (FLONASE) 50 MCG/ACT nasal spray USE 2 SPRAYS IN EACH NOSTRIL DAILY AS NEEDED FOR ALLERGIES 48 mL 0   gabapentin (NEURONTIN) 300 MG capsule Take 1 capsule (300 mg total) by mouth at bedtime. 90 capsule 3   levothyroxine (SYNTHROID) 150 MCG tablet TAKE 1 TABLET BY MOUTH DAILY BEFORE BREAKFAST. 90 tablet 3   losartan-hydrochlorothiazide (HYZAAR) 100-12.5 MG tablet Take 1 tablet by mouth daily. 90 tablet 0   rosuvastatin (CRESTOR) 20 MG tablet Take 1 tablet (20 mg total) by mouth daily. 90 tablet 0   sertraline (ZOLOFT) 100 MG tablet Take 1 tablet (100 mg total) by mouth daily. 90 tablet 0   SUMAtriptan (IMITREX) 100 MG tablet TAKE 1 TABLET BY MOUTH AS NEEDED FOR MIGRAINE *MAY REPEAT IN 24 HOURS AS DIRECTED 12 tablet 1   tamoxifen (NOLVADEX) 20 MG tablet Take 1 tablet (20 mg total) by mouth daily. 90 tablet 3   Vitamin D-Vitamin K (VITAMIN K2-VITAMIN D3 PO) Take 5,000 Units by mouth.     No current facility-administered medications for this visit.     OBJECTIVE: White woman who appears stated age  2:   04/03/22 1355  BP: 133/79  Pulse: 81  Resp: 16  Temp: 98.6 F (37 C)  SpO2: 96%     Body mass index is 38.39 kg/m.   Wt Readings from Last 3 Encounters:  04/03/22 216 lb 11.2 oz (98.3 kg)  08/07/21 204 lb 6.4 oz (92.7 kg)  07/04/21 195 lb (88.5 kg)  ECOG FS:1  Physical Exam Constitutional:      Appearance: Normal appearance.  Cardiovascular:     Rate and Rhythm: Normal rate and regular rhythm.  Pulmonary:     Effort: Pulmonary effort is normal.     Breath sounds: Normal breath sounds.  Chest:     Comments: Bilateral breasts inspected.  No palpable masses or regional adenopathy Musculoskeletal:        General: No swelling or tenderness. Normal range of motion.     Cervical back: Normal range of motion and neck supple. No rigidity.  Lymphadenopathy:     Cervical: No cervical adenopathy.  Skin:    General: Skin is warm and dry.  Neurological:     Mental Status: She is alert.     LAB RESULTS:  CMP     Component Value Date/Time   NA 140 08/07/2021 1446   K 4.4 08/07/2021 1446   CL 106 08/07/2021 1446   CO2 27 08/07/2021 1446   GLUCOSE 105 (H) 08/07/2021 1446   BUN 19 08/07/2021 1446   CREATININE 0.80 08/07/2021 1446   CREATININE 0.86 01/22/2019 0915   CALCIUM 8.5 (L) 08/07/2021 1446   PROT 6.4 (L) 08/07/2021 1446   ALBUMIN 3.8 08/07/2021 1446   AST 16 08/07/2021 1446   AST 20 01/22/2019 0915   ALT 14 08/07/2021 1446   ALT 22 01/22/2019 0915   ALKPHOS 61 08/07/2021 1446   BILITOT 0.3 08/07/2021 1446   BILITOT 0.3 01/22/2019 0915   GFRNONAA >60 08/07/2021 1446   GFRNONAA >60 01/22/2019 0915   GFRAA >60 04/26/2020 1323   GFRAA >60 01/22/2019 0915    Lab Results  Component Value Date   WBC 4.6 08/07/2021   NEUTROABS 2.8 08/07/2021   HGB 13.4 08/07/2021   HCT 40.0 08/07/2021   MCV 88.5 08/07/2021   PLT 179 08/07/2021   No results found for: "LABCA2"  No components found for:  "  LKJZPH150"  No results for input(s): "INR" in the last 168 hours.  No results found for: "LABCA2"  No results found for: "VWP794"  No results found for: "CAN125"  No results found for: "CAN153"  No results found for: "CA2729"  No components found for: "HGQUANT"  No results found for: "CEA1", "CEA" / No results found for: "CEA1", "CEA"   No results found for: "AFPTUMOR"  No results found for: "CHROMOGRNA"  No results found for: "TOTALPROTELP", "ALBUMINELP", "A1GS", "A2GS", "BETS", "BETA2SER", "GAMS", "MSPIKE", "SPEI" (this displays SPEP labs)  No results found for: "KPAFRELGTCHN", "LAMBDASER", "KAPLAMBRATIO" (kappa/lambda light chains)  No results found for: "HGBA", "HGBA2QUANT", "HGBFQUANT", "HGBSQUAN" (Hemoglobinopathy evaluation)   No results found for: "LDH"  No results found for: "IRON", "TIBC", "IRONPCTSAT" (Iron and TIBC)  Lab Results  Component Value Date   FERRITIN 48 11/23/2020    Urinalysis    Component Value Date/Time   COLORURINE YELLOW 09/13/2011 [place] 09/13/2011 0952   LABSPEC 1.010 09/13/2011 0952   PHURINE 6.5 09/13/2011 0952   GLUCOSEU NEGATIVE 09/13/2011 [place] 09/13/2011 0952   HGBUR negative 04/03/2009 0949   BILIRUBINUR n 09/15/2012 0832   KETONESUR NEGATIVE 09/13/2011 0952   PROTEINUR n 09/15/2012 0832   PROTEINUR NEGATIVE 09/13/2011 0952   UROBILINOGEN 0.2 09/15/2012 0832   UROBILINOGEN 0.2 09/13/2011 0952   NITRITE n 09/15/2012 0832   NITRITE NEGATIVE 09/13/2011 0952   LEUKOCYTESUR Negative 09/15/2012 0832    STUDIES:  No results found.   ELIGIBLE FOR AVAILABLE RESEARCH PROTOCOL: no   ASSESSMENT: 66 y.o. [place] woman status post left breast lower inner quadrant biopsy 07/31/2018 for a clinical T1b N0, stage IA invasive ductal carcinoma, grade 3, triple positive, with an MIB-1 of 75%.  (1) status post left lumpectomy and sentinel lymph node sampling 09/07/2018 for a pT1c pN0, stage IA  invasive ductal carcinoma, grade 3, with negative margins.  (a) a total of 5 sentinel lymph nodes were removed  (2) adjuvant chemotherapy consisting of paclitaxel weekly x12 with trastuzumab every 21 days starting 10/02/2018, completed 12/25/2018  (3) trastuzumab continued to complete 1 year (through December 2020).  (a) echocardiogram 08/17/2018 shows an ejection fraction of 60-65%  (b) echocardiogram on 11/17/2018 shows an ejection fraction of 60-65%  (c) echo 02/08/2019 shows an ejection fraction in the 60-65%  (d) Echo in 05/2019 shows EF of 60-65%  (e) Echo on 09/16/2019 shows EF of 60-65%  (4) adjuvant radiation 02/04/2019 - 03/04/2019  (a) Left breast treated to 42.56 Gy in 16 fractions  (b) Seroma boost of 8 Gy in 4 fractions, for a total of 50.56 Gy  (5) started anastrozole 04/11/2019, discontinued February 2022 with possible neuropathy  (a) bone density scan 11/05/2019 showed a T score of -0.3, which is normal  (b) tamoxifen started February 2022   PLAN:  She is here for follow up on tamoxifen. She is worried that the pharmacist mentioned about interaction with wellbutrin. She was hoping to try another antidepressant which doesn't interfere with tamoxifen.  I encouraged her to talk to her PCP about considering Lexapro.  We have discussed that Wellbutrin in theory can decrease the effect of tamoxifen, but most of the evidence is interact with Wellbutrin.  We have discussed that paroxetine and fluoxetine have a large effect on metabolism off tamoxifen and not recommended to be used concurrently.  She will talk to her PCP about considering other antidepressants, she is already on sertraline.  We have discussed about considering  venlafaxine or Lexapro which may not have significant effect on tamoxifen's metabolism.   Physical examination otherwise without any concerns.  Next mammogram due in October.  She will return to clinic in 6 months. She is tolerating tamoxifen well and the plan  is to continue that a minimum of 5 years which will take her to July 2025. Total time spent: 30 minutes  *Total Encounter Time as defined by the Centers for Medicare and Medicaid Services includes, in addition to the face-to-face time of a patient visit (documented in the note above) non-face-to-face time: obtaining and reviewing outside history, ordering and reviewing medications, tests or procedures, care coordination (communications with other health care professionals or caregivers) and documentation in the medical record.

## 2022-04-17 ENCOUNTER — Other Ambulatory Visit: Payer: Self-pay | Admitting: Family Medicine

## 2022-04-17 DIAGNOSIS — R059 Cough, unspecified: Secondary | ICD-10-CM

## 2022-04-17 NOTE — Telephone Encounter (Signed)
Last OV with Dr. Maudie Mercury- 02/14/22 Last refill-03/18/22  Next OVphysical- 06/24/22

## 2022-04-19 ENCOUNTER — Telehealth: Payer: Self-pay | Admitting: Family Medicine

## 2022-04-19 NOTE — Telephone Encounter (Signed)
Left message for patient to call back and schedule Medicare Annual Wellness Visit (AWV) either virtually or in office. Left  my Herbie Drape number [phone]   Last AWV 04/24/21 ; please schedule at anytime with [facility] Nurse Health Advisor 1 or 2

## 2022-04-25 ENCOUNTER — Telehealth: Payer: Self-pay

## 2022-04-25 ENCOUNTER — Other Ambulatory Visit: Payer: Self-pay | Admitting: Family Medicine

## 2022-04-25 DIAGNOSIS — E782 Mixed hyperlipidemia: Secondary | ICD-10-CM

## 2022-04-25 NOTE — Telephone Encounter (Signed)
Unsuccessful attempt to reach patient on preferred number listed in notes for scheduled AWV. Left message on voicemail okay to reschedule. 

## 2022-04-30 ENCOUNTER — Telehealth: Payer: Self-pay | Admitting: Family Medicine

## 2022-04-30 MED ORDER — AMPHETAMINE-DEXTROAMPHETAMINE 30 MG PO TABS: 30.0000 mg | ORAL_TABLET | Freq: Every day | ORAL | 0 refills | Status: DC

## 2022-04-30 NOTE — Telephone Encounter (Signed)
Pt called to request a refill of:  amphetamine-dextroamphetamine (ADDERALL) 30 MG tablet  Last Seen in Person at this Clinic:  CPE on 06/19/21    Pt is scheduled for a CPE on 06/24/22.  Please advise.   [id number] [place] [place] - [address]

## 2022-05-03 ENCOUNTER — Other Ambulatory Visit: Payer: Self-pay | Admitting: Family Medicine

## 2022-05-07 ENCOUNTER — Other Ambulatory Visit: Payer: Self-pay | Admitting: Family Medicine

## 2022-05-07 DIAGNOSIS — R059 Cough, unspecified: Secondary | ICD-10-CM

## 2022-05-13 ENCOUNTER — Ambulatory Visit (INDEPENDENT_AMBULATORY_CARE_PROVIDER_SITE_OTHER): Payer: Medicare Other

## 2022-05-13 VITALS — Ht 63.0 in | Wt 215.0 lb

## 2022-05-13 DIAGNOSIS — Z0000 Encounter for general adult medical examination without abnormal findings: Secondary | ICD-10-CM | POA: Diagnosis not present

## 2022-05-13 NOTE — Progress Notes (Signed)
I connected with Tiffany Montes today by telephone and verified that I am speaking with the correct person using two identifiers. Location patient: home Location provider: work Persons participating in the virtual visit: Leahmarie, Gasiorowski LPN.   I discussed the limitations, risks, security and privacy concerns of performing an evaluation and management service by telephone and the availability of in person appointments. I also discussed with the patient that there may be a patient responsible charge related to this service. The patient expressed understanding and verbally consented to this telephonic visit.    Interactive audio and video telecommunications were attempted between this provider and patient, however failed, due to patient having technical difficulties OR patient did not have access to video capability.  We continued and completed visit with audio only.     Vital signs may be patient reported or missing.  Subjective:   Tiffany Montes is a 66 y.o. female who presents for Medicare Annual (Subsequent) preventive examination.  Review of Systems     Cardiac Risk Factors include: advanced age (>7mn, >>18women);dyslipidemia;hypertension;obesity (BMI >30kg/m2)     Objective:    Today's Vitals   05/13/22 1041  Weight: 215 lb (97.5 kg)  Height: '5\' 3"'$  (1.6 m)   Body mass index is 38.09 kg/m.     05/13/2022   10:48 AM 04/24/2021    9:56 AM 11/04/2019    1:42 PM 10/29/2019    4:18 PM 01/13/2019   10:06 AM 08/31/2018    2:15 PM 08/12/2018    8:43 PM  Advanced Directives  Does Patient Have a Medical Advance Directive? Yes Yes Yes Yes Yes Yes Yes  Type of AParamedicof AHarpers FerryLiving will HRocky FordLiving will Living will Living will H[place]Living will Healthcare Power of [place]Living will  Does patient want to make changes to medical advance directive?   No -  Patient declined No - Patient declined No - Patient declined  No - Patient declined  Copy of HVerdenin Chart? No - copy requested No - copy requested No - copy requested No - copy requested  No - copy requested No - copy requested    Current Medications (verified) Outpatient Encounter Medications as of 05/13/2022  Medication Sig   ALPRAZolam (XANAX) 0.5 MG tablet TAKE 1 TABLET BY MOUTH AT BEDTIME AS NEEDED FOR ANXIETY.   amphetamine-dextroamphetamine (ADDERALL) 30 MG tablet Take 1 tablet by mouth daily.   benzonatate (TESSALON) 100 MG capsule TAKE 1 CAPSULE BY MOUTH 2 TIMES DAILY AS NEEDED FOR COUGH   buPROPion (WELLBUTRIN XL) 300 MG 24 hr tablet Take 1 tablet (300 mg total) by mouth daily.   fluticasone (FLONASE) 50 MCG/ACT nasal spray USE 2 SPRAYS IN EACH NOSTRIL DAILY AS NEEDED FOR ALLERGIES   gabapentin (NEURONTIN) 300 MG capsule Take 1 capsule (300 mg total) by mouth at bedtime.   levothyroxine (SYNTHROID) 150 MCG tablet TAKE 1 TABLET BY MOUTH DAILY BEFORE BREAKFAST.   losartan-hydrochlorothiazide (HYZAAR) 100-12.5 MG tablet Take 1 tablet by mouth daily.   rosuvastatin (CRESTOR) 20 MG tablet TAKE 1 TABLET BY MOUTH EVERY DAY   sertraline (ZOLOFT) 100 MG tablet Take 1 tablet (100 mg total) by mouth daily.   SUMAtriptan (IMITREX) 100 MG tablet TAKE 1 TABLET BY MOUTH AS NEEDED FOR MIGRAINE *MAY REPEAT IN 24 HOURS AS DIRECTED   tamoxifen (NOLVADEX) 20 MG tablet Take 1 tablet (20 mg total) by mouth daily.   Vitamin  D-Vitamin K (VITAMIN K2-VITAMIN D3 PO) Take 5,000 Units by mouth.   amphetamine-dextroamphetamine (ADDERALL) 30 MG tablet Take 1 tablet by mouth daily.   amphetamine-dextroamphetamine (ADDERALL) 30 MG tablet Take 1 tablet by mouth daily.   doxycycline (VIBRA-TABS) 100 MG tablet Take 1 tablet (100 mg total) by mouth 2 (two) times daily. (Patient not taking: Reported on 05/13/2022)   [DISCONTINUED] prochlorperazine (COMPAZINE) 10 MG tablet Take 1 tablet (10 mg total)  by mouth every 6 (six) hours as needed (Nausea or vomiting).   No facility-administered encounter medications on file as of 05/13/2022.    Allergies (verified) Morphine sulfate   History: Past Medical History:  Diagnosis Date   ADHD    Anxiety    Attention deficit disorder    Back pain    Breast cancer ([place]) 2019   Left Breast Cancer   Depression    Gallbladder problem    Gallstones 09/05/2011   Hyperlipidemia    Hypertension    Hypothyroidism    Joint pain    Migraines    Neuropathy    Personal history of chemotherapy 2019   Left Breast Cancer   Personal history of radiation therapy 2019   Left Breast Cancer   PONV (postoperative nausea and vomiting)    diffficulty waking up   Thyroid disease    hypothyroid   Past Surgical History:  Procedure Laterality Date   ABDOMINAL HYSTERECTOMY  2003   BREAST BIOPSY Left 2014   benign   BREAST LUMPECTOMY Left 09/07/2018   BREAST LUMPECTOMY WITH RADIOACTIVE SEED AND SENTINEL LYMPH NODE BIOPSY Left 09/07/2018   Procedure: LEFT BREAST LUMPECTOMY WITH RADIOACTIVE SEED AND AXILLARY SENTINEL LYMPH NODE BIOPSY,INJECT BLUE DYE LEFT BREAST;  Surgeon: Fanny Skates, MD;  Location: [facility];  Service: General;  Laterality: Left;   CHOLECYSTECTOMY  09/26/2011   Procedure: LAPAROSCOPIC CHOLECYSTECTOMY WITH INTRAOPERATIVE CHOLANGIOGRAM;  Surgeon: Haywood Lasso, MD;  Location: [facility];  Service: General;  Laterality: N/A;   COLONOSCOPY     CYSTIC HYGROMA EXCISION     DENTAL SURGERY     skin graft from top of mouth to lower gums   PORT-A-CATH REMOVAL Right 11/04/2019   Procedure: REMOVAL PORT-A-CATH;  Surgeon: Donnie Mesa, MD;  Location: [facility];  Service: General;  Laterality: Right;   PORTACATH PLACEMENT N/A 09/07/2018   Procedure: INSERTION PORT-A-CATH WITH [place];  Surgeon: Fanny Skates, MD;  Location: [facility];  Service: General;  Laterality: N/A;   REPLACEMENT TOTAL KNEE Right    Family History  Problem Relation Age  of Onset   Diabetes Father    Heart disease Father    Lung cancer Father    Hypertension Father    Hyperlipidemia Father    Thyroid disease Father    Alcoholism Father    Hypertension Mother    Thyroid disease Mother    Breast cancer Neg Hx    Ovarian cancer Neg Hx    Social History   Socioeconomic History   Marital status: Divorced    Spouse name: Not on file   Number of children: Not on file   Years of education: Not on file   Highest education level: Not on file  Occupational History   Occupation: retired  Tobacco Use   Smoking status: Never   Smokeless tobacco: Never  Vaping Use   Vaping Use: Never used  Substance and Sexual Activity   Alcohol use: Not Currently    Comment: 1-2 a week and reports not every week  Drug use: No   Sexual activity: Not Currently    Birth control/protection: Surgical  Other Topics Concern   Not on file  Social History Narrative   Not on file   Social Determinants of Health   Financial Resource Strain: Low Risk  (05/13/2022)   Overall Financial Resource Strain (CARDIA)    Difficulty of Paying Living Expenses: Not hard at all  Food Insecurity: No Food Insecurity (05/13/2022)   Hunger Vital Sign    Worried About Running Out of Food in the Last Year: Never true    Ran Out of Food in the Last Year: Never true  Transportation Needs: No Transportation Needs (05/13/2022)   PRAPARE - Hydrologist (Medical): No    Lack of Transportation (Non-Medical): No  Physical Activity: Inactive (05/13/2022)   Exercise Vital Sign    Days of Exercise per Week: 0 days    Minutes of Exercise per Session: 0 min  Stress: No Stress Concern Present (05/13/2022)   Champion    Feeling of Stress : Only a little  Social Connections: Moderately Isolated (04/24/2021)   Social Connection and Isolation Panel [NHANES]    Frequency of Communication with Friends and Family: Three  times a week    Frequency of Social Gatherings with Friends and Family: Three times a week    Attends Religious Services: More than 4 times per year    Active Member of Clubs or Organizations: No    Attends Archivist Meetings: Never    Marital Status: Divorced    Tobacco Counseling Counseling given: Not Answered   Clinical Intake:  Pre-visit preparation completed: Yes  Pain : No/denies pain     Nutritional Status: BMI > 30  Obese Nutritional Risks: None Diabetes: No  How often do you need to have someone help you when you read instructions, pamphlets, or other written materials from your doctor or pharmacy?: 1 - Never What is the last grade level you completed in school?: 12th grade  Diabetic? no  Interpreter Needed?: No  Information entered by :: NAllen LPN   Activities of Daily Living    05/13/2022   10:56 AM  In your present state of health, do you have any difficulty performing the following activities:  Hearing? 0  Vision? 0  Difficulty concentrating or making decisions? 1  Walking or climbing stairs? 1  Comment due to knee  Dressing or bathing? 0  Doing errands, shopping? 0  Preparing Food and eating ? N  Using the Toilet? N  In the past six months, have you accidently leaked urine? Y  Do you have problems with loss of bowel control? N  Managing your Medications? N  Managing your Finances? N  Housekeeping or managing your Housekeeping? N    Patient Care Team: Eulas Post, MD as PCP - General Magrinat, Virgie Dad, MD (Inactive) as Consulting Physician (Oncology) Kyung Rudd, MD as Consulting Physician (Radiation Oncology) Maisie Fus, MD (Inactive) as Consulting Physician (Obstetrics and Gynecology) Larey Dresser, MD as Consulting Physician (Cardiology)  Indicate any recent Medical Services you may have received from other than [facility] providers in the past year (date may be approximate).     Assessment:   This is a routine  wellness examination for Tiffany Montes.  Hearing/Vision screen Vision Screening - Comments:: Regular eye exams, [facility], Dr. Clydene Laming  Dietary issues and exercise activities discussed: Current Exercise Habits: The patient does  not participate in regular exercise at present   Goals Addressed             This Visit's Progress    Patient Stated       05/13/2022, no goals       Depression Screen    05/13/2022   10:51 AM 04/24/2021   10:00 AM 04/24/2021    9:57 AM 04/24/2021    9:53 AM 11/23/2020   10:17 AM  PHQ 2/9 Scores  PHQ - 2 Score 2 0 0 0 6  PHQ- 9 Score 4    18    Fall Risk    05/13/2022   10:51 AM 04/24/2021    9:57 AM  Fall Risk   Falls in the past year? 0 0  Number falls in past yr: 0 0  Injury with Fall? 0 0  Risk for fall due to : Medication side effect   Follow up Falls evaluation completed;Education provided;Falls prevention discussed Falls evaluation completed    FALL RISK PREVENTION PERTAINING TO THE HOME:  Any stairs in or around the home? No  If so, are there any without handrails? N/a Home free of loose throw rugs in walkways, pet beds, electrical cords, etc? Yes  Adequate lighting in your home to reduce risk of falls? Yes   ASSISTIVE DEVICES UTILIZED TO PREVENT FALLS:  Life alert? No  Use of a cane, walker or w/c? No  Grab bars in the bathroom? Yes  Shower chair or bench in shower? Yes  Elevated toilet seat or a handicapped toilet? Yes   TIMED UP AND GO:  Was the test performed? No .       Cognitive Function:        05/13/2022   10:58 AM  6CIT Screen  What Year? 0 points  What month? 0 points  What time? 0 points  Count back from 20 0 points  Months in reverse 0 points  Repeat phrase 8 points  Total Score 8 points    Immunizations Immunization History  Administered Date(s) Administered   Fluad Quad(high Dose 65+) 08/28/2020, 06/19/2021   Influenza Split 08/08/2011, 08/24/2012   Influenza, Seasonal, Injecte, Preservative  Fre 07/26/2014   Influenza,inj,Quad PF,6+ Mos 06/03/2017, 08/08/2018, 08/02/2019   Influenza-Unspecified 07/07/2016, 08/08/2018   PFIZER Comirnaty(Gray Top)Covid-19 Tri-Sucrose Vaccine 03/20/2021   PFIZER(Purple Top)SARS-COV-2 Vaccination 12/05/2019, 12/29/2019, 08/28/2020   Pfizer Covid-19 Vaccine Bivalent Booster 7yr & up 03/21/2022   Pneumococcal Conjugate-13 09/28/2018   Pneumococcal Polysaccharide-23 04/19/2020   Td 04/14/2009   Tdap 04/19/2019   Zoster, Live 12/04/2015    TDAP status: Up to date  Flu Vaccine status: Due, Education has been provided regarding the importance of this vaccine. Advised may receive this vaccine at local pharmacy or Health Dept. Aware to provide a copy of the vaccination record if obtained from local pharmacy or Health Dept. Verbalized acceptance and understanding.  Pneumococcal vaccine status: Up to date  Covid-19 vaccine status: Completed vaccines  Qualifies for Shingles Vaccine? Yes   Zostavax completed Yes   Shingrix Completed?: No.    Education has been provided regarding the importance of this vaccine. Patient has been advised to call insurance company to determine out of pocket expense if they have not yet received this vaccine. Advised may also receive vaccine at local pharmacy or Health Dept. Verbalized acceptance and understanding.  Screening Tests Health Maintenance  Topic Date Due   INFLUENZA VACCINE  05/07/2022   Zoster Vaccines- Shingrix (1 of 2) 05/17/2022 (Originally 02/09/1975)  MAMMOGRAM  08/21/2022   TETANUS/TDAP  04/18/2029   COLONOSCOPY (Pts 45-55yr Insurance coverage will need to be confirmed)  05/12/2029   Pneumonia Vaccine 67 Years old  Completed   DEXA SCAN  Completed   COVID-19 Vaccine  Completed   Hepatitis C Screening  Completed   HPV VACCINES  Aged Out    Health Maintenance  Health Maintenance Due  Topic Date Due   INFLUENZA VACCINE  05/07/2022    Colorectal cancer screening: Type of screening:  Colonoscopy. Completed 05/13/2019. Repeat every 10 years  Mammogram status: Completed 08/21/2021. Repeat every year  Bone Density status: Completed 11/05/2019.   Lung Cancer Screening: (Low Dose CT Chest recommended if Age 67-80years, 30 pack-year currently smoking OR have quit w/in 15years.) does not qualify.   Lung Cancer Screening Referral: no  Additional Screening:  Hepatitis C Screening: does qualify; Completed 01/28/2018  Vision Screening: Recommended annual ophthalmology exams for early detection of glaucoma and other disorders of the eye. Is the patient up to date with their annual eye exam?  Yes  Who is the provider or what is the name of the office in which the patient attends annual eye exams? Dr. WClydene LamingIf pt is not established with a provider, would they like to be referred to a provider to establish care? No .   Dental Screening: Recommended annual dental exams for proper oral hygiene  Community Resource Referral / Chronic Care Management: CRR required this visit?  No   CCM required this visit?  No      Plan:     I have personally reviewed and noted the following in the patient's chart:   Medical and social history Use of alcohol, tobacco or illicit drugs  Current medications and supplements including opioid prescriptions.  Functional ability and status Nutritional status Physical activity Advanced directives List of other physicians Hospitalizations, surgeries, and ER visits in previous 12 months Vitals Screenings to include cognitive, depression, and falls Referrals and appointments  In addition, I have reviewed and discussed with patient certain preventive protocols, quality metrics, and best practice recommendations. A written personalized care plan for preventive services as well as general preventive health recommendations were provided to patient.     NKellie Simmering LPN   87/12/2200  Nurse Notes: none  Due to this being a virtual visit, the after  visit summary with patients personalized plan was offered to patient via mail or my-chart. Patient would like to access on my-chart

## 2022-05-13 NOTE — Patient Instructions (Signed)
Tiffany Montes , Thank you for taking time to come for your Medicare Wellness Visit. I appreciate your ongoing commitment to your health goals. Please review the following plan we discussed and let me know if I can assist you in the future.   Screening recommendations/referrals: Colonoscopy: completed 05/13/2019, due 05/12/2029 Mammogram: scheduled for 08/02/2022 Bone Density: completed 11/05/2019 Recommended yearly ophthalmology/optometry visit for glaucoma screening and checkup Recommended yearly dental visit for hygiene and checkup  Vaccinations: Influenza vaccine: due Pneumococcal vaccine: completed 04/19/2020 Tdap vaccine: completed 04/19/2019, due 04/18/2029 Shingles vaccine: discussed   Covid-19: 03/21/2022, 03/20/2021, 08/28/2020, 12/29/2019, 12/05/2019  Advanced directives: Please bring a copy of your POA (Power of Attorney) and/or Living Will to your next appointment.   Conditions/risks identified: none  Next appointment: Follow up in one year for your annual wellness visit    Preventive Care 65 Years and Older, Female Preventive care refers to lifestyle choices and visits with your health care provider that can promote health and wellness. What does preventive care include? A yearly physical exam. This is also called an annual well check. Dental exams once or twice a year. Routine eye exams. Ask your health care provider how often you should have your eyes checked. Personal lifestyle choices, including: Daily care of your teeth and gums. Regular physical activity. Eating a healthy diet. Avoiding tobacco and drug use. Limiting alcohol use. Practicing safe sex. Taking low-dose aspirin every day. Taking vitamin and mineral supplements as recommended by your health care provider. What happens during an annual well check? The services and screenings done by your health care provider during your annual well check will depend on your age, overall health, lifestyle risk factors, and family  history of disease. Counseling  Your health care provider may ask you questions about your: Alcohol use. Tobacco use. Drug use. Emotional well-being. Home and relationship well-being. Sexual activity. Eating habits. History of falls. Memory and ability to understand (cognition). Work and work Statistician. Reproductive health. Screening  You may have the following tests or measurements: Height, weight, and BMI. Blood pressure. Lipid and cholesterol levels. These may be checked every 5 years, or more frequently if you are over 57 years old. Skin check. Lung cancer screening. You may have this screening every year starting at age 85 if you have a 30-pack-year history of smoking and currently smoke or have quit within the past 15 years. Fecal occult blood test (FOBT) of the stool. You may have this test every year starting at age 20. Flexible sigmoidoscopy or colonoscopy. You may have a sigmoidoscopy every 5 years or a colonoscopy every 10 years starting at age 9. Hepatitis C blood test. Hepatitis B blood test. Sexually transmitted disease (STD) testing. Diabetes screening. This is done by checking your blood sugar (glucose) after you have not eaten for a while (fasting). You may have this done every 1-3 years. Bone density scan. This is done to screen for osteoporosis. You may have this done starting at age 92. Mammogram. This may be done every 1-2 years. Talk to your health care provider about how often you should have regular mammograms. Talk with your health care provider about your test results, treatment options, and if necessary, the need for more tests. Vaccines  Your health care provider may recommend certain vaccines, such as: Influenza vaccine. This is recommended every year. Tetanus, diphtheria, and acellular pertussis (Tdap, Td) vaccine. You may need a Td booster every 10 years. Zoster vaccine. You may need this after age 20. Pneumococcal 13-valent conjugate (  PCV13)  vaccine. One dose is recommended after age 70. Pneumococcal polysaccharide (PPSV23) vaccine. One dose is recommended after age 45. Talk to your health care provider about which screenings and vaccines you need and how often you need them. This information is not intended to replace advice given to you by your health care provider. Make sure you discuss any questions you have with your health care provider. Document Released: 10/20/2015 Document Revised: 06/12/2016 Document Reviewed: 07/25/2015 Elsevier Interactive Patient Education  2017 [place] Prevention in the Home Falls can cause injuries. They can happen to people of all ages. There are many things you can do to make your home safe and to help prevent falls. What can I do on the outside of my home? Regularly fix the edges of walkways and driveways and fix any cracks. Remove anything that might make you trip as you walk through a door, such as a raised step or threshold. Trim any bushes or trees on the path to your home. Use bright outdoor lighting. Clear any walking paths of anything that might make someone trip, such as rocks or tools. Regularly check to see if handrails are loose or broken. Make sure that both sides of any steps have handrails. Any raised decks and porches should have guardrails on the edges. Have any leaves, snow, or ice cleared regularly. Use sand or salt on walking paths during winter. Clean up any spills in your garage right away. This includes oil or grease spills. What can I do in the bathroom? Use night lights. Install grab bars by the toilet and in the tub and shower. Do not use towel bars as grab bars. Use non-skid mats or decals in the tub or shower. If you need to sit down in the shower, use a plastic, non-slip stool. Keep the floor dry. Clean up any water that spills on the floor as soon as it happens. Remove soap buildup in the tub or shower regularly. Attach bath mats securely with  double-sided non-slip rug tape. Do not have throw rugs and other things on the floor that can make you trip. What can I do in the bedroom? Use night lights. Make sure that you have a light by your bed that is easy to reach. Do not use any sheets or blankets that are too big for your bed. They should not hang down onto the floor. Have a firm chair that has side arms. You can use this for support while you get dressed. Do not have throw rugs and other things on the floor that can make you trip. What can I do in the kitchen? Clean up any spills right away. Avoid walking on wet floors. Keep items that you use a lot in easy-to-reach places. If you need to reach something above you, use a strong step stool that has a grab bar. Keep electrical cords out of the way. Do not use floor polish or wax that makes floors slippery. If you must use wax, use non-skid floor wax. Do not have throw rugs and other things on the floor that can make you trip. What can I do with my stairs? Do not leave any items on the stairs. Make sure that there are handrails on both sides of the stairs and use them. Fix handrails that are broken or loose. Make sure that handrails are as long as the stairways. Check any carpeting to make sure that it is firmly attached to the stairs. Fix any carpet that is  loose or worn. Avoid having throw rugs at the top or bottom of the stairs. If you do have throw rugs, attach them to the floor with carpet tape. Make sure that you have a light switch at the top of the stairs and the bottom of the stairs. If you do not have them, ask someone to add them for you. What else can I do to help prevent falls? Wear shoes that: Do not have high heels. Have rubber bottoms. Are comfortable and fit you well. Are closed at the toe. Do not wear sandals. If you use a stepladder: Make sure that it is fully opened. Do not climb a closed stepladder. Make sure that both sides of the stepladder are locked  into place. Ask someone to hold it for you, if possible. Clearly mark and make sure that you can see: Any grab bars or handrails. First and last steps. Where the edge of each step is. Use tools that help you move around (mobility aids) if they are needed. These include: Canes. Walkers. Scooters. Crutches. Turn on the lights when you go into a dark area. Replace any light bulbs as soon as they burn out. Set up your furniture so you have a clear path. Avoid moving your furniture around. If any of your floors are uneven, fix them. If there are any pets around you, be aware of where they are. Review your medicines with your doctor. Some medicines can make you feel dizzy. This can increase your chance of falling. Ask your doctor what other things that you can do to help prevent falls. This information is not intended to replace advice given to you by your health care provider. Make sure you discuss any questions you have with your health care provider. Document Released: 07/20/2009 Document Revised: 02/29/2016 Document Reviewed: 10/28/2014 Elsevier Interactive Patient Education  2017 [employer].

## 2022-05-15 ENCOUNTER — Encounter (INDEPENDENT_AMBULATORY_CARE_PROVIDER_SITE_OTHER): Payer: Self-pay

## 2022-05-17 ENCOUNTER — Other Ambulatory Visit: Payer: Self-pay | Admitting: *Deleted

## 2022-05-17 MED ORDER — GABAPENTIN 300 MG PO CAPS: 300.0000 mg | ORAL_CAPSULE | Freq: Every day | ORAL | 3 refills | Status: DC

## 2022-06-03 ENCOUNTER — Other Ambulatory Visit: Payer: Self-pay | Admitting: Family Medicine

## 2022-06-03 DIAGNOSIS — R059 Cough, unspecified: Secondary | ICD-10-CM

## 2022-06-05 NOTE — Telephone Encounter (Signed)
  Last VV with Dr. Maudie Mercury on 02/14/22 Last refill- Benzonatate- 04/18/22-40 tabs Imitrex- 05/07/22--12 tabs, 0 refills  Next Ov CPE- 06/24/22

## 2022-06-24 ENCOUNTER — Ambulatory Visit (INDEPENDENT_AMBULATORY_CARE_PROVIDER_SITE_OTHER): Payer: Medicare Other | Admitting: Family Medicine

## 2022-06-24 VITALS — BP 122/76 | HR 84 | Temp 98.5°F | Ht 62.0 in | Wt 217.2 lb

## 2022-06-24 DIAGNOSIS — R7303 Prediabetes: Secondary | ICD-10-CM

## 2022-06-24 DIAGNOSIS — H109 Unspecified conjunctivitis: Secondary | ICD-10-CM

## 2022-06-24 DIAGNOSIS — E039 Hypothyroidism, unspecified: Secondary | ICD-10-CM

## 2022-06-24 DIAGNOSIS — Z23 Encounter for immunization: Secondary | ICD-10-CM | POA: Diagnosis not present

## 2022-06-24 DIAGNOSIS — E782 Mixed hyperlipidemia: Secondary | ICD-10-CM

## 2022-06-24 DIAGNOSIS — I10 Essential (primary) hypertension: Secondary | ICD-10-CM

## 2022-06-24 LAB — LIPID PANEL
Cholesterol: 145 mg/dL (ref 0–200)
HDL: 59.7 mg/dL (ref >39.00)
LDL Cholesterol: 71 mg/dL (ref 0–99)
NonHDL: 85.32
Total CHOL/HDL Ratio: 2
Triglycerides: 71 mg/dL (ref 0.0–149.0)
VLDL: 14.2 mg/dL (ref 0.0–40.0)

## 2022-06-24 LAB — HEMOGLOBIN A1C: Hgb A1c MFr Bld: 6.3 % (ref 4.6–6.5)

## 2022-06-24 LAB — BASIC METABOLIC PANEL
BUN: 16 mg/dL (ref 6–23)
CO2: 27 mEq/L (ref 19–32)
Calcium: 9.1 mg/dL (ref 8.4–10.5)
Chloride: 104 mEq/L (ref 96–112)
Creatinine, Ser: 0.89 mg/dL (ref 0.40–1.20)
GFR: 67.11 mL/min (ref >60.00)
Glucose, Bld: 115 mg/dL — ABNORMAL HIGH (ref 70–99)
Potassium: 4.5 mEq/L (ref 3.5–5.1)
Sodium: 140 mEq/L (ref 135–145)

## 2022-06-24 LAB — CBC WITH DIFFERENTIAL/PLATELET
Basophils Absolute: 0 10*3/uL (ref 0.0–0.1)
Basophils Relative: 0.5 % (ref 0.0–3.0)
Eosinophils Absolute: 0.2 10*3/uL (ref 0.0–0.7)
Eosinophils Relative: 3.4 % (ref 0.0–5.0)
HCT: 41 % (ref 36.0–46.0)
Hemoglobin: 13.5 g/dL (ref 12.0–15.0)
Lymphocytes Relative: 21.8 % (ref 12.0–46.0)
Lymphs Abs: 1.4 10*3/uL (ref 0.7–4.0)
MCHC: 32.8 g/dL (ref 30.0–36.0)
MCV: 87.7 fl (ref 78.0–100.0)
Monocytes Absolute: 0.5 10*3/uL (ref 0.1–1.0)
Monocytes Relative: 8.2 % (ref 3.0–12.0)
Neutro Abs: 4.2 10*3/uL (ref 1.4–7.7)
Neutrophils Relative %: 66.1 % (ref 43.0–77.0)
Platelets: 186 10*3/uL (ref 150.0–400.0)
RBC: 4.67 Mil/uL (ref 3.87–5.11)
RDW: 13.3 % (ref 11.5–15.5)
WBC: 6.3 10*3/uL (ref 4.0–10.5)

## 2022-06-24 LAB — HEPATIC FUNCTION PANEL
ALT: 11 U/L (ref 0–35)
AST: 16 U/L (ref 0–37)
Albumin: 4.1 g/dL (ref 3.5–5.2)
Alkaline Phosphatase: 50 U/L (ref 39–117)
Bilirubin, Direct: 0.1 mg/dL (ref 0.0–0.3)
Total Bilirubin: 0.3 mg/dL (ref 0.2–1.2)
Total Protein: 6.8 g/dL (ref 6.0–8.3)

## 2022-06-24 LAB — TSH: TSH: 1.52 u[IU]/mL (ref 0.35–5.50)

## 2022-06-24 MED ORDER — POLYMYXIN B-TRIMETHOPRIM 10000-0.1 UNIT/ML-% OP SOLN: 2.0000 [drp] | OPHTHALMIC | 0 refills | Status: AC

## 2022-06-24 NOTE — Patient Instructions (Signed)
Remember second Shingrix vaccine.

## 2022-06-24 NOTE — Progress Notes (Signed)
Established Patient Office Visit  Subjective   Patient ID: Tiffany Montes, female    DOB: 02-Jan-1956  Age: 66 y.o. MRN: [id number]  Chief Complaint  Patient presents with   Conjunctivitis    Symptoms began x 1 day ago.   Annual Exam   Urinary Incontinence    Noticed more in last year   Fatigue    HPI   Tiffany Montes was scheduled for "complete physical ".  However, she has Medicare primary and already had Medicare wellness visit.  She is seen today for medical follow-up of multiple medical problems as addressed below  First, she has concern for conjunctivitis right eye.  Her daughter-in-law has the same recently.  She woke up yesterday with some redness and matting and drainage of the right eye.  No contact use.  No left eye symptoms.  No eye pain.  Some itching.  Has used some warm compresses  She has chronic problems including hypothyroidism, hypertension, obesity, breast cancer, hyperlipidemia.  Her blood sugars have been in the prediabetes range for some time.  She is unfortunately not exercising.  Had substantial weight gain in recent years.  Also has family history of type 2 diabetes.  Hypothyroidism treated with levothyroxine 150 mcg daily.  She takes losartan HCTZ for hypertension and Crestor 20 mg daily for hyperlipidemia.  Compliant with regular medications.  Past Medical History:  Diagnosis Date   ADHD    Anxiety    Attention deficit disorder    Back pain    Breast cancer ([place]) 2019   Left Breast Cancer   Depression    Gallbladder problem    Gallstones 09/05/2011   Hyperlipidemia    Hypertension    Hypothyroidism    Joint pain    Migraines    Neuropathy    Personal history of chemotherapy 2019   Left Breast Cancer   Personal history of radiation therapy 2019   Left Breast Cancer   PONV (postoperative nausea and vomiting)    diffficulty waking up   Thyroid disease    hypothyroid   Past Surgical History:  Procedure Laterality Date   ABDOMINAL HYSTERECTOMY   2003   BREAST BIOPSY Left 2014   benign   BREAST LUMPECTOMY Left 09/07/2018   BREAST LUMPECTOMY WITH RADIOACTIVE SEED AND SENTINEL LYMPH NODE BIOPSY Left 09/07/2018   Procedure: LEFT BREAST LUMPECTOMY WITH RADIOACTIVE SEED AND AXILLARY SENTINEL LYMPH NODE BIOPSY,INJECT BLUE DYE LEFT BREAST;  Surgeon: Fanny Skates, MD;  Location: [place];  Service: General;  Laterality: Left;   CHOLECYSTECTOMY  09/26/2011   Procedure: LAPAROSCOPIC CHOLECYSTECTOMY WITH INTRAOPERATIVE CHOLANGIOGRAM;  Surgeon: Haywood Lasso, MD;  Location: [place];  Service: General;  Laterality: N/A;   COLONOSCOPY     CYSTIC HYGROMA EXCISION     DENTAL SURGERY     skin graft from top of mouth to lower gums   PORT-A-CATH REMOVAL Right 11/04/2019   Procedure: REMOVAL PORT-A-CATH;  Surgeon: Donnie Mesa, MD;  Location: [place];  Service: General;  Laterality: Right;   PORTACATH PLACEMENT N/A 09/07/2018   Procedure: INSERTION PORT-A-CATH WITH [place];  Surgeon: Fanny Skates, MD;  Location: [place];  Service: General;  Laterality: N/A;   REPLACEMENT TOTAL KNEE Right     reports that she has never smoked. She has never used smokeless tobacco. She reports that she does not currently use alcohol. She reports that she does not use drugs. family history includes Alcoholism in her father; Diabetes in her father; Heart disease in  her father; Hyperlipidemia in her father; Hypertension in her father and mother; Lung cancer in her father; Thyroid disease in her father and mother. Allergies  Allergen Reactions   Morphine Sulfate Nausea And Vomiting and Rash    GI upset     Review of Systems  Constitutional:  Negative for chills, fever and malaise/fatigue.  Eyes:  Positive for discharge and redness. Negative for blurred vision, double vision and pain.  Respiratory:  Negative for shortness of breath.   Cardiovascular:  Negative for chest pain.  Neurological:  Negative for dizziness, weakness and headaches.       Objective:     BP 122/76 (BP Location: Right Arm, Patient Position: Sitting, Cuff Size: Large)   Pulse 84   Temp 98.5 F (36.9 C) (Oral)   Ht '5\' 2"'$  (1.575 m)   Wt 217 lb 3.2 oz (98.5 kg)   SpO2 96%   BMI 39.73 kg/m    Physical Exam Vitals reviewed.  HENT:     Right Ear: Tympanic membrane normal.     Left Ear: Tympanic membrane normal.  Eyes:     Comments: She has redness involving the right conjunctiva.  .  Cornea appears normal.  Cardiovascular:     Rate and Rhythm: Normal rate and regular rhythm.  Pulmonary:     Effort: Pulmonary effort is normal.     Breath sounds: Normal breath sounds.  Abdominal:     Palpations: Abdomen is soft.     Tenderness: There is no abdominal tenderness.  Musculoskeletal:     Cervical back: Neck supple.     Right lower leg: No edema.     Left lower leg: No edema.  Lymphadenopathy:     Cervical: No cervical adenopathy.  Neurological:     General: No focal deficit present.     Mental Status: She is alert.      No results found for any visits on 06/24/22.    The 10-year ASCVD risk score (Arnett DK, et al., 2019) is: 7%    Assessment & Plan:   #1 hypothyroidism.  Recheck TSH.  #2 hyperlipidemia.  Patient on Crestor.  Recheck lipid and hepatic panel.  #3 hypertension stable and at goal.  Continue current medications.  Check basic metabolic panel  #4 history of prediabetes range blood sugars.  Strongly suggest she scale back sugars and starches and establish more consistent exercise and try to lose some weight.  Recheck A1c today  #5 bacterial conjunctivitis involving right eye.  Continue warm compresses.  Polytrim ophthalmic drops 2 drops right eye every 4 hours while awake.  Follow-up for any persistent or worsening symptoms  -Flu vaccine given   No follow-ups on file.    Carolann Littler, MD

## 2022-06-28 ENCOUNTER — Other Ambulatory Visit: Payer: Self-pay | Admitting: Family Medicine

## 2022-06-28 DIAGNOSIS — R059 Cough, unspecified: Secondary | ICD-10-CM

## 2022-07-12 ENCOUNTER — Other Ambulatory Visit: Payer: Self-pay | Admitting: Family Medicine

## 2022-07-12 DIAGNOSIS — R059 Cough, unspecified: Secondary | ICD-10-CM

## 2022-07-12 NOTE — Telephone Encounter (Signed)
Last refill Tessalon '100mg'$  capsules-06/05/22 Last OV-06/24/22  No future OV scheduled.   Okay for refill?

## 2022-08-02 ENCOUNTER — Ambulatory Visit
Admission: RE | Admit: 2022-08-02 | Discharge: 2022-08-02 | Disposition: A | Discharge status: Home / Self Care | Payer: Medicare Other | Source: Ambulatory Visit | Attending: Hematology and Oncology | Admitting: Hematology and Oncology

## 2022-08-02 DIAGNOSIS — R921 Mammographic calcification found on diagnostic imaging of breast: Secondary | ICD-10-CM

## 2022-08-11 ENCOUNTER — Other Ambulatory Visit: Payer: Self-pay | Admitting: Family Medicine

## 2022-08-11 DIAGNOSIS — R059 Cough, unspecified: Secondary | ICD-10-CM

## 2022-08-12 ENCOUNTER — Other Ambulatory Visit: Payer: Self-pay | Admitting: Family Medicine

## 2022-09-10 ENCOUNTER — Telehealth: Payer: Self-pay | Admitting: Hematology and Oncology

## 2022-09-10 NOTE — Telephone Encounter (Signed)
Called patient to r/s upcoming appointment. Left voicemail with new appointment information.

## 2022-09-12 ENCOUNTER — Ambulatory Visit (INDEPENDENT_AMBULATORY_CARE_PROVIDER_SITE_OTHER): Payer: Medicare Other | Admitting: Family Medicine

## 2022-09-12 ENCOUNTER — Encounter: Payer: Self-pay | Admitting: Family Medicine

## 2022-09-12 ENCOUNTER — Ambulatory Visit (INDEPENDENT_AMBULATORY_CARE_PROVIDER_SITE_OTHER): Payer: Medicare Other

## 2022-09-12 VITALS — BP 138/94 | HR 76 | Temp 97.7°F | Wt 217.0 lb

## 2022-09-12 DIAGNOSIS — R059 Cough, unspecified: Secondary | ICD-10-CM

## 2022-09-12 DIAGNOSIS — R6883 Chills (without fever): Secondary | ICD-10-CM | POA: Diagnosis not present

## 2022-09-12 LAB — POCT INFLUENZA A/B
Influenza A, POC: NEGATIVE
Influenza B, POC: NEGATIVE

## 2022-09-12 LAB — POC COVID19 BINAXNOW: SARS Coronavirus 2 Ag: NEGATIVE

## 2022-09-12 MED ORDER — AMOXICILLIN-POT CLAVULANATE 500-125 MG PO TABS: 1.0000 | ORAL_TABLET | Freq: Two times a day (BID) | ORAL | 0 refills | Status: AC

## 2022-09-12 MED ORDER — AZITHROMYCIN 250 MG PO TABS: ORAL_TABLET | ORAL | 0 refills | Status: AC

## 2022-09-12 NOTE — Patient Instructions (Signed)
We will obtain a chest x-ray to evaluate for pneumonia.  While we are waiting on the results we will start azithromycin and Augmentin.  These are antibiotics that treat community-acquired pneumonia.

## 2022-09-12 NOTE — Progress Notes (Signed)
Subjective:    Patient ID: Tiffany Montes, female    DOB: 11/04/1955, 66 y.o.   MRN: [id number]  Chief Complaint  Patient presents with   Cough     Pt c/o cough going on to 3wks. Coughing green phlegm. Chills comes and goes. Fatigue. Feels chest pressure from cough. Taking mucinex    HPI Patient is a 66 year old female with pmh sig for ADHD, anxiety, history of left breast cancer, depression, HLD, HTN, hypothyroidism, migraines, neuropathy followed by Dr. Elease Hashimoto and seen today for ongoing concern.  Patient endorses cough, rhinorrhea, dizziness, congestion, chills x 3 weeks.  Patient states cough is worse at night.  Endorses wheezing at times.  Denies fever, headache, sore throat, diarrhea.  Taking Mucinex for symptoms and Tessalon without relief.  Patient was around her stepson 3 weeks ago who had RSV.  Patient endorses increased stress as her son has been in the hospital for over a month.  Past Medical History:  Diagnosis Date   ADHD    Anxiety    Attention deficit disorder    Back pain    Breast cancer ([place]) 2019   Left Breast Cancer   Depression    Gallbladder problem    Gallstones 09/05/2011   Hyperlipidemia    Hypertension    Hypothyroidism    Joint pain    Migraines    Neuropathy    Personal history of chemotherapy 2019   Left Breast Cancer   Personal history of radiation therapy 2019   Left Breast Cancer   PONV (postoperative nausea and vomiting)    diffficulty waking up   Thyroid disease    hypothyroid    Allergies  Allergen Reactions   Morphine Sulfate Nausea And Vomiting and Rash    GI upset    ROS General: Denies fever, night sweats, changes in weight, changes in appetite + fatigue, chills HEENT: Denies headaches, ear pain, changes in vision, sore throat + rhinorrhea CV: Denies CP, palpitations, SOB, orthopnea Pulm:+ cough, SOB, wheezing GI: Denies abdominal pain, nausea, vomiting, diarrhea, constipation GU: Denies dysuria, hematuria, frequency,  vaginal discharge Msk: Denies muscle cramps, joint pains Neuro: Denies weakness, numbness, tingling Skin: Denies rashes, bruising Psych: Denies depression, anxiety, hallucinations      Objective:    Blood pressure (!) 138/94, pulse 76, temperature 97.7 F (36.5 C), temperature source Oral, weight 217 lb (98.4 kg), SpO2 95 %.  Gen. Pleasant, well-nourished, in no distress, normal affect   HEENT: [place]/AT, face symmetric, conjunctiva clear, no scleral icterus, PERRLA, EOMI, nares patent without drainage, pharynx without erythema or exudate.  TMs normal bilaterally. Lungs: Cough, no accessory muscle use, mildly decreased breath sounds in bilateral bases.  No wheezes or rales Cardiovascular: RRR, no m/r/g, no peripheral edema Musculoskeletal: No deformities, no cyanosis or clubbing, normal tone Neuro:  A&Ox3, CN II-XII intact, normal gait Skin:  Warm, no lesions/ rash   Wt Readings from Last 3 Encounters:  09/12/22 217 lb (98.4 kg)  06/24/22 217 lb 3.2 oz (98.5 kg)  05/13/22 215 lb (97.5 kg)    Lab Results  Component Value Date   WBC 6.3 06/24/2022   HGB 13.5 06/24/2022   HCT 41.0 06/24/2022   PLT 186.0 06/24/2022   GLUCOSE 115 (H) 06/24/2022   CHOL 145 06/24/2022   TRIG 71.0 06/24/2022   HDL 59.70 06/24/2022   LDLDIRECT 136.7 09/15/2012   LDLCALC 71 06/24/2022   ALT 11 06/24/2022   AST 16 06/24/2022   NA 140 06/24/2022   K  4.5 06/24/2022   CL 104 06/24/2022   CREATININE 0.89 06/24/2022   BUN 16 06/24/2022   CO2 27 06/24/2022   TSH 1.52 06/24/2022   INR 1.2 (H) 03/14/2020   HGBA1C 6.3 06/24/2022    Assessment/Plan:  Cough, unspecified type - Plan: POC COVID-19, POC Influenza A/B, DG Chest 2 View, amoxicillin-clavulanate (AUGMENTIN) 500-125 MG tablet, azithromycin (ZITHROMAX) 250 MG tablet  Chills - Plan: POC COVID-19, POC Influenza A/B, DG Chest 2 View  Productive cough x 3 weeks with chills and dizziness.  COVID and flu testing negative.  Obtain CXR.  While  waiting on results we will start empirical treatment for CAP with Augmentin and azithromycin.  Continue Tessalon and other OTC cough/cold medications.  Given strict precautions.  32 minutes spent reviewing chart, formulating plan, and other care for patient on date of visit.  F/u as needed  Grier Mitts, MD

## 2022-09-15 ENCOUNTER — Other Ambulatory Visit: Payer: Self-pay | Admitting: Family Medicine

## 2022-09-23 ENCOUNTER — Other Ambulatory Visit: Payer: Self-pay | Admitting: Family Medicine

## 2022-09-23 ENCOUNTER — Other Ambulatory Visit: Payer: Self-pay | Admitting: Hematology and Oncology

## 2022-09-23 DIAGNOSIS — R059 Cough, unspecified: Secondary | ICD-10-CM

## 2022-09-23 NOTE — Telephone Encounter (Signed)
Per Dr. Rob Hickman OV note in June 2023 - tolerating tamoxifen well and the plan is to continue that a minimum of 5 years which will take her to July 2025.

## 2022-09-27 ENCOUNTER — Other Ambulatory Visit: Payer: Self-pay | Admitting: Family Medicine

## 2022-10-03 ENCOUNTER — Ambulatory Visit: Payer: Medicare Other | Admitting: Hematology and Oncology

## 2022-10-10 ENCOUNTER — Inpatient Hospital Stay: Payer: Medicare Other | Attending: Hematology and Oncology | Admitting: Hematology and Oncology

## 2022-10-10 DIAGNOSIS — C50312 Malignant neoplasm of lower-inner quadrant of left female breast: Secondary | ICD-10-CM | POA: Insufficient documentation

## 2022-10-10 DIAGNOSIS — Z923 Personal history of irradiation: Secondary | ICD-10-CM | POA: Insufficient documentation

## 2022-10-10 DIAGNOSIS — M7989 Other specified soft tissue disorders: Secondary | ICD-10-CM | POA: Insufficient documentation

## 2022-10-10 DIAGNOSIS — Z170 Estrogen receptor positive status [ER+]: Secondary | ICD-10-CM | POA: Insufficient documentation

## 2022-10-10 DIAGNOSIS — Z79810 Long term (current) use of selective estrogen receptor modulators (SERMs): Secondary | ICD-10-CM | POA: Insufficient documentation

## 2022-10-10 NOTE — Progress Notes (Deleted)
Tiffany Montes  [phone] [phone]    ID: Tiffany Montes DOB: 04/09/1956  MR#: [id number]  QAS#:[id number]  Patient Care Team: Eulas Post, MD as PCP - General Magrinat, Virgie Dad, MD (Inactive) as Consulting Physician (Oncology) Kyung Rudd, MD as Consulting Physician (Radiation Oncology) Maisie Fus, MD (Inactive) as Consulting Physician (Obstetrics and Gynecology) Larey Dresser, MD as Consulting Physician (Cardiology) OTHER MD:    CHIEF COMPLAINT: Estrogen receptor positive breast cancer, HER-2 amplified  CURRENT TREATMENT: tamoxifen  INTERVAL HISTORY: Tiffany Montes returns today for follow-up of her estrogen receptor positive and HER2 amplified breast cancer.   She was switched to tamoxifen at her last visit on 11/22/2020.   She had a mammogram recently in May which showed some calcifications in the right breast and a 42-monthmammogram was recommended.  She states she is very worried about the interaction between Wellbutrin and tamoxifen which was mentioned to her by her pharmacist. She has not been exercising much, says she is fatigued and can do better.   No other changes in breast reported. No change in breathing, bowel habits or urinary habits reported. Rest of the pertinent 10 point ROS reviewed and negative   COVID 19 VACCINATION STATUS: Status post PTopangax2 with booster August 28, 2020   HISTORY OF CURRENT ILLNESS: From the original intake note:  "PPrecious Montes had routine screening mammography on 07/22/2018 showing a possible abnormality in the left breast. She underwent unilateral diagnostic mammography with tomography [facility] showing: Breast density Category C; a 7 x 8 x 8 mm hypoechoic mass in the left breast lower inner quadrant, consistent with a complex cyst.. No abnormal lymph nodes in the left axilla.  Accordingly on 07/31/2018 she proceeded to biopsy of the left  breast area in question. The pathology from this procedure showed ([id number]): Invasive Ductal Carcinoma, Grade 3. Prognostic indicators significant for: estrogen receptor, 95% positive and progesterone receptor, 85% positive, both with strong staining intensity. Proliferation marker Ki67 at 75%. HER2 positive by immunohistochemistry, 3+.   The patient's subsequent history is as detailed below.   PAST MEDICAL HISTORY: Past Medical History:  Diagnosis Date   ADHD    Anxiety    Attention deficit disorder    Back pain    Breast cancer ([facility] 2019   Left Breast Cancer   Depression    Gallbladder problem    Gallstones 09/05/2011   Hyperlipidemia    Hypertension    Hypothyroidism    Joint pain    Migraines    Neuropathy    Personal history of chemotherapy 2019   Left Breast Cancer   Personal history of radiation therapy 2019   Left Breast Cancer   PONV (postoperative nausea and vomiting)    diffficulty waking up   Thyroid disease    hypothyroid    PAST SURGICAL HISTORY: Past Surgical History:  Procedure Laterality Date   ABDOMINAL HYSTERECTOMY  2003   BREAST BIOPSY Left 2014   benign   BREAST LUMPECTOMY Left 09/07/2018   BREAST LUMPECTOMY WITH RADIOACTIVE SEED AND SENTINEL LYMPH NODE BIOPSY Left 09/07/2018   Procedure: LEFT BREAST LUMPECTOMY WITH RADIOACTIVE SEED AND AXILLARY SENTINEL LYMPH NODE BIOPSY,INJECT BLUE DYE LEFT BREAST;  Surgeon: IFanny Skates MD;  [facility]  Service: General;  Laterality: Left;   CHOLECYSTECTOMY  09/26/2011   Procedure: LAPAROSCOPIC CHOLECYSTECTOMY WITH INTRAOPERATIVE CHOLANGIOGRAM;  Surgeon: CHaywood Lasso MD;  [facility]  Service: General;  Laterality: N/A;   COLONOSCOPY     CYSTIC HYGROMA EXCISION     DENTAL SURGERY     skin graft from top of mouth to lower gums   PORT-A-CATH REMOVAL Right 11/04/2019   Procedure: REMOVAL PORT-A-CATH;  Surgeon: Donnie Mesa, MD;  Location: [facility];  Service: General;   Laterality: Right;   PORTACATH PLACEMENT N/A 09/07/2018   Procedure: INSERTION PORT-A-CATH WITH [place];  Surgeon: Fanny Skates, MD;  Location: [facility];  Service: General;  Laterality: N/A;   REPLACEMENT TOTAL KNEE Right     FAMILY HISTORY: Family History  Problem Relation Age of Onset   Diabetes Father    Heart disease Father    Lung cancer Father    Hypertension Father    Hyperlipidemia Father    Thyroid disease Father    Alcoholism Father    Hypertension Mother    Thyroid disease Mother    Breast cancer Neg Hx    Ovarian cancer Neg Hx   She notes that her father died from CHF at age 50.  He was diagnosed with lung cancer at age 25. Patients' mother is 25 years old as of November 2019. The patient has 1 brother and 2 sisters. Patient denies a family history of ovarian or breast cancer.   GYNECOLOGIC HISTORY:  No LMP recorded. Patient has had a hysterectomy. 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 She has been unable to work there more recently for a variety of medical issues.  She is divorced and lives by herself with her 2 dogs. Her daughter [name] is a Dealer in [place], [name].  The patient's son, [name] is a Training and development officer and lives in [place]. The patient has 2 grandchildren and 1 great-grand child.  She is not a church attender   ADVANCED DIRECTIVES: Her [place] is her daughter, [name], [phone].     HEALTH MAINTENANCE: Social History   Tobacco Use   Smoking status: Never   Smokeless tobacco: Never  Vaping Use   Vaping Use: Never used  Substance Use Topics   Alcohol use: Not Currently    Comment: 1-2 a week and reports not every week   Drug use: No    Colonoscopy: 05/2019, Dr.  Earlean Shawl, 1 adenoma  PAP: 1 month ago  Bone density: Yes, 2 years ago   Allergies  Allergen Reactions   Morphine Sulfate Nausea And Vomiting and Rash    GI upset    Current Outpatient Medications  Medication Sig Dispense Refill   ALPRAZolam (XANAX) 0.5 MG tablet TAKE 1 TABLET BY MOUTH AT BEDTIME AS NEEDED FOR ANXIETY 30 tablet 2   amphetamine-dextroamphetamine (ADDERALL) 30 MG tablet Take 1 tablet by mouth daily. 30 tablet 0   amphetamine-dextroamphetamine (ADDERALL) 30 MG tablet Take 1 tablet by mouth daily. 30 tablet 0   amphetamine-dextroamphetamine (ADDERALL) 30 MG tablet Take 1 tablet by mouth daily. 30 tablet 0   benzonatate (TESSALON) 100 MG capsule TAKE 1 CAPSULE BY MOUTH 2 TIMES DAILY AS NEEDED FOR COUGH 40 capsule 0   buPROPion (WELLBUTRIN XL) 300 MG 24 hr tablet TAKE ONE TABLET BY MOUTH ONE TIME DAILY 90 tablet 0   fluticasone (FLONASE) 50 MCG/ACT nasal spray SPRAY 2 SPRAYS  INTO EACH NOSTRIL DAILY AS NEEDED FOR ALLERGIES 48 mL 0   gabapentin (NEURONTIN) 300 MG capsule Take 1 capsule (300 mg total) by mouth at bedtime. 90 capsule 3   levothyroxine (SYNTHROID) 150 MCG tablet TAKE 1 TABLET BY MOUTH EVERY DAY BEFORE BREAKFAST 90 tablet 3   losartan-hydrochlorothiazide (HYZAAR) 100-12.5 MG tablet Take 1 tablet by mouth daily. 90 tablet 0   neomycin-polymyxin b-dexamethasone (MAXITROL) 3.5-10000-0.1 OINT      rosuvastatin (CRESTOR) 20 MG tablet TAKE 1 TABLET BY MOUTH EVERY DAY 90 tablet 0   sertraline (ZOLOFT) 100 MG tablet TAKE 1 TABLET BY MOUTH EVERY DAY 90 tablet 1   SUMAtriptan (IMITREX) 100 MG tablet TAKE 1 TABLET BY MOUTH AS NEEDED FOR MIGRAINE *MAY REPEAT IN 24 HOURS AS DIRECTED 12 tablet 1   tamoxifen (NOLVADEX) 20 MG tablet TAKE 1 TABLET BY MOUTH EVERY DAY 90 tablet 3   trimethoprim-polymyxin b (POLYTRIM) ophthalmic solution Place 2 drops into the right eye every 4 (four) hours. (Patient not taking: Reported on 09/12/2022) 10 mL 0   Vitamin D-Vitamin K (VITAMIN K2-VITAMIN D3  PO) Take 5,000 Units by mouth.     No current facility-administered medications for this visit.    OBJECTIVE: White woman who appears stated age  There were no vitals filed for this visit.    There is no height or weight on file to calculate BMI.   Wt Readings from Last 3 Encounters:  09/12/22 217 lb (98.4 kg)  06/24/22 217 lb 3.2 oz (98.5 kg)  05/13/22 215 lb (97.5 kg)  ECOG FS:1  Physical Exam Constitutional:      Appearance: Normal appearance.  Cardiovascular:     Rate and Rhythm: Normal rate and regular rhythm.  Pulmonary:     Effort: Pulmonary effort is normal.     Breath sounds: Normal breath sounds.  Chest:     Comments: Bilateral breasts inspected.  No palpable masses or regional adenopathy Musculoskeletal:        General: No swelling or tenderness. Normal range of motion.     Cervical back: Normal range of motion and neck supple. No rigidity.  Lymphadenopathy:     Cervical: No cervical adenopathy.  Skin:    General: Skin is warm and dry.  Neurological:     Mental Status: She is alert.     LAB RESULTS:  CMP     Component Value Date/Time   NA 140 06/24/2022 1130   K 4.5 06/24/2022 1130   CL 104 06/24/2022 1130   CO2 27 06/24/2022 1130   GLUCOSE 115 (H) 06/24/2022 1130   BUN 16 06/24/2022 1130   CREATININE 0.89 06/24/2022 1130   CREATININE 0.78 04/03/2022 1346   CALCIUM 9.1 06/24/2022 1130   PROT 6.8 06/24/2022 1130   ALBUMIN 4.1 06/24/2022 1130   AST 16 06/24/2022 1130   AST 15 04/03/2022 1346   ALT 11 06/24/2022 1130   ALT 11 04/03/2022 1346   ALKPHOS 50 06/24/2022 1130   BILITOT 0.3 06/24/2022 1130   BILITOT 0.3 04/03/2022 1346   GFRNONAA >60 04/03/2022 1346   GFRAA >60 04/26/2020 1323   GFRAA >60 01/22/2019 0915    Lab Results  Component Value Date   WBC 6.3 06/24/2022   NEUTROABS 4.2 06/24/2022   HGB 13.5 06/24/2022   HCT 41.0 06/24/2022   MCV 87.7 06/24/2022   PLT 186.0 06/24/2022   No results found for: "LABCA2"  No components  found for: "UYQIHK742"  No results for input(s): "INR" in the  last 168 hours.  No results found for: "LABCA2"  No results found for: "XNT700"  No results found for: "CAN125"  No results found for: "CAN153"  No results found for: "CA2729"  No components found for: "HGQUANT"  No results found for: "CEA1", "CEA" / No results found for: "CEA1", "CEA"   No results found for: "AFPTUMOR"  No results found for: "CHROMOGRNA"  No results found for: "TOTALPROTELP", "ALBUMINELP", "A1GS", "A2GS", "BETS", "BETA2SER", "GAMS", "MSPIKE", "SPEI" (this displays SPEP labs)  No results found for: "KPAFRELGTCHN", "LAMBDASER", "KAPLAMBRATIO" (kappa/lambda light chains)  No results found for: "HGBA", "HGBA2QUANT", "HGBFQUANT", "HGBSQUAN" (Hemoglobinopathy evaluation)   No results found for: "LDH"  No results found for: "IRON", "TIBC", "IRONPCTSAT" (Iron and TIBC)  Lab Results  Component Value Date   FERRITIN 48 11/23/2020    Urinalysis    Component Value Date/Time   COLORURINE YELLOW 09/13/2011 [place] 09/13/2011 0952   LABSPEC 1.010 09/13/2011 0952   PHURINE 6.5 09/13/2011 0952   GLUCOSEU NEGATIVE 09/13/2011 0952   HGBUR NEGATIVE 09/13/2011 0952   HGBUR negative 04/03/2009 0949   BILIRUBINUR n 09/15/2012 0832   KETONESUR NEGATIVE 09/13/2011 0952   PROTEINUR n 09/15/2012 0832   PROTEINUR NEGATIVE 09/13/2011 0952   UROBILINOGEN 0.2 09/15/2012 0832   UROBILINOGEN 0.2 09/13/2011 0952   NITRITE n 09/15/2012 0832   NITRITE NEGATIVE 09/13/2011 0952   LEUKOCYTESUR Negative 09/15/2012 0832    STUDIES:  DG Chest 2 View  Result Date: 09/12/2022 CLINICAL DATA:  Cough for 3 weeks, dizziness, fatigue, chills EXAM: CHEST - 2 VIEW COMPARISON:  09/07/2018 FINDINGS: Borderline enlargement of cardiac silhouette. Tortuous aorta. Mediastinal contours and pulmonary vascularity otherwise normal. Lungs clear. No infiltrate, pleural effusion, or pneumothorax. No acute osseous  findings. IMPRESSION: No acute abnormalities. Electronically Signed   By: Lavonia Dana M.D.   On: 09/12/2022 10:04     ELIGIBLE FOR AVAILABLE RESEARCH PROTOCOL: no   ASSESSMENT: 67 y.o. [place] woman status post left breast lower inner quadrant biopsy 07/31/2018 for a clinical T1b N0, stage IA invasive ductal carcinoma, grade 3, triple positive, with an MIB-1 of 75%.  (1) status post left lumpectomy and sentinel lymph node sampling 09/07/2018 for a pT1c pN0, stage IA invasive ductal carcinoma, grade 3, with negative margins.  (a) a total of 5 sentinel lymph nodes were removed  (2) adjuvant chemotherapy consisting of paclitaxel weekly x12 with trastuzumab every 21 days starting 10/02/2018, completed 12/25/2018  (3) trastuzumab continued to complete 1 year (through December 2020).  (a) echocardiogram 08/17/2018 shows an ejection fraction of 60-65%  (b) echocardiogram on 11/17/2018 shows an ejection fraction of 60-65%  (c) echo 02/08/2019 shows an ejection fraction in the 60-65%  (d) Echo in 05/2019 shows EF of 60-65%  (e) Echo on 09/16/2019 shows EF of 60-65%  (4) adjuvant radiation 02/04/2019 - 03/04/2019  (a) Left breast treated to 42.56 Gy in 16 fractions  (b) Seroma boost of 8 Gy in 4 fractions, for a total of 50.56 Gy  (5) started anastrozole 04/11/2019, discontinued February 2022 with possible neuropathy  (a) bone density scan 11/05/2019 showed a T score of -0.3, which is normal  (b) tamoxifen started February 2022   PLAN:  She is here for follow up on tamoxifen. She is worried that the pharmacist mentioned about interaction with wellbutrin. She was hoping to try another antidepressant which doesn't interfere with tamoxifen.  I encouraged her to talk to her PCP about considering Lexapro.  We have discussed that Wellbutrin in  theory can decrease the effect of tamoxifen, but most of the evidence is interact with Wellbutrin.  We have discussed that paroxetine and fluoxetine have a  large effect on metabolism off tamoxifen and not recommended to be used concurrently.  She will talk to her PCP about considering other antidepressants, she is already on sertraline.  We have discussed about considering venlafaxine or Lexapro which may not have significant effect on tamoxifen's metabolism.   Physical examination otherwise without any concerns.  Next mammogram due in October.  She will return to clinic in 6 months. She is tolerating tamoxifen well and the plan is to continue that a minimum of 5 years which will take her to July 2025. Total time spent: 30 minutes  *Total Encounter Time as defined by the Centers for Medicare and Medicaid Services includes, in addition to the face-to-face time of a patient visit (documented in the note above) non-face-to-face time: obtaining and reviewing outside history, ordering and reviewing medications, tests or procedures, care coordination (communications with other health care professionals or caregivers) and documentation in the medical record.

## 2022-10-14 ENCOUNTER — Other Ambulatory Visit: Payer: Self-pay | Admitting: Family Medicine

## 2022-10-14 DIAGNOSIS — R059 Cough, unspecified: Secondary | ICD-10-CM

## 2022-10-15 ENCOUNTER — Telehealth: Payer: Self-pay | Admitting: Family Medicine

## 2022-10-15 ENCOUNTER — Encounter: Payer: Self-pay | Admitting: Family Medicine

## 2022-10-15 ENCOUNTER — Ambulatory Visit (INDEPENDENT_AMBULATORY_CARE_PROVIDER_SITE_OTHER): Payer: Medicare Other | Admitting: Family Medicine

## 2022-10-15 VITALS — BP 126/80 | HR 79 | Temp 97.9°F | Ht 62.0 in | Wt 215.3 lb

## 2022-10-15 DIAGNOSIS — R051 Acute cough: Secondary | ICD-10-CM

## 2022-10-15 DIAGNOSIS — E782 Mixed hyperlipidemia: Secondary | ICD-10-CM | POA: Diagnosis not present

## 2022-10-15 DIAGNOSIS — R059 Cough, unspecified: Secondary | ICD-10-CM

## 2022-10-15 DIAGNOSIS — S6992XA Unspecified injury of left wrist, hand and finger(s), initial encounter: Secondary | ICD-10-CM

## 2022-10-15 MED ORDER — ROSUVASTATIN CALCIUM 20 MG PO TABS: 20.0000 mg | ORAL_TABLET | Freq: Every day | ORAL | 3 refills | Status: DC

## 2022-10-15 MED ORDER — ALBUTEROL SULFATE HFA 108 (90 BASE) MCG/ACT IN AERS: 2.0000 | INHALATION_SPRAY | Freq: Four times a day (QID) | RESPIRATORY_TRACT | 0 refills | Status: DC | PRN

## 2022-10-15 NOTE — Telephone Encounter (Signed)
  amphetamine-dextroamphetamine (ADDERALL) 30 MG tablet benzonatate (TESSALON) 100 MG capsule   [id number] [place] - [address] [place] [place]: [phone] Fax: [phone]

## 2022-10-15 NOTE — Progress Notes (Signed)
Established Patient Office Visit  Subjective   Patient ID: Tiffany Montes, female    DOB: 01-17-1956  Age: 67 y.o. MRN: [id number]  Chief Complaint  Patient presents with   Cough    Patient complains of cough, x1 week, Nonproductive, Tried Mucinex    Wheezing    Patient complains of wheezing, x1 week   Finger Injury    Patient complains of left pinky finger injury, x2 months, Patient reported she was carrying groceries when injury occurred    HPI   Tiffany Montes is seen for the following items  2-week history of cough.  This is mostly dry.  She feels like she may be having some intermittent wheezing at night.  No dyspnea.  No fever.  No sinusitis symptoms.  Previously prescribed albuterol but she thinks this is out of date.  No hemoptysis.  She relates injury to her left fifth digit about 2 months ago.  She was carrying in several groceries and 1 bag in particular is very heavy and she feels like she strained her left fifth digit DIP joint at that time.  She had little bit of swelling at the DIP joint since that time and inability to fully extend at that joint.  Minimal pain.  Requesting refill of his rosuvastatin.  Tolerating well.  She had cholesterol 145 with LDL of 71 back in September.  She has had recent increase stress issues.  She states her 48 year old son is currently living with her.  He has history of heroin addiction and multiple medical complications.  He has essentially nowhere to live right now and this has been very stressful for her  Past Medical History:  Diagnosis Date   ADHD    Anxiety    Attention deficit disorder    Back pain    Breast cancer ([facility]) 2019   Left Breast Cancer   Depression    Gallbladder problem    Gallstones 09/05/2011   Hyperlipidemia    Hypertension    Hypothyroidism    Joint pain    Migraines    Neuropathy    Personal history of chemotherapy 2019   Left Breast Cancer   Personal history of radiation therapy 2019   Left Breast  Cancer   PONV (postoperative nausea and vomiting)    diffficulty waking up   Thyroid disease    hypothyroid   Past Surgical History:  Procedure Laterality Date   ABDOMINAL HYSTERECTOMY  2003   BREAST BIOPSY Left 2014   benign   BREAST LUMPECTOMY Left 09/07/2018   BREAST LUMPECTOMY WITH RADIOACTIVE SEED AND SENTINEL LYMPH NODE BIOPSY Left 09/07/2018   Procedure: LEFT BREAST LUMPECTOMY WITH RADIOACTIVE SEED AND AXILLARY SENTINEL LYMPH NODE BIOPSY,INJECT BLUE DYE LEFT BREAST;  Surgeon: Tiffany Skates, MD;  Location: [facility];  Service: General;  Laterality: Left;   CHOLECYSTECTOMY  09/26/2011   Procedure: LAPAROSCOPIC CHOLECYSTECTOMY WITH INTRAOPERATIVE CHOLANGIOGRAM;  Surgeon: Tiffany Lasso, MD;  Location: [facility];  Service: General;  Laterality: N/A;   COLONOSCOPY     CYSTIC HYGROMA EXCISION     DENTAL SURGERY     skin graft from top of mouth to lower gums   PORT-A-CATH REMOVAL Right 11/04/2019   Procedure: REMOVAL PORT-A-CATH;  Surgeon: Tiffany Mesa, MD;  Location: [facility];  Service: General;  Laterality: Right;   PORTACATH PLACEMENT N/A 09/07/2018   Procedure: INSERTION PORT-A-CATH WITH [place];  Surgeon: Tiffany Skates, MD;  Location: [facility];  Service: General;  Laterality: N/A;   REPLACEMENT  TOTAL KNEE Right     reports that she has never smoked. She has never used smokeless tobacco. She reports that she does not currently use alcohol. She reports that she does not use drugs. family history includes Alcoholism in her father; Diabetes in her father; Heart disease in her father; Hyperlipidemia in her father; Hypertension in her father and mother; Lung cancer in her father; Thyroid disease in her father and mother. Allergies  Allergen Reactions   Morphine Sulfate Nausea And Vomiting and Rash    GI upset    Review of Systems  Constitutional:  Negative for chills and fever.  HENT:  Negative for sinus pain.   Respiratory:  Positive for cough.   Cardiovascular:   Negative for chest pain.      Objective:     BP 126/80 (BP Location: Left Arm, Patient Position: Sitting, Cuff Size: Large)   Pulse 79   Temp 97.9 F (36.6 C) (Oral)   Ht '5\' 2"'$  (1.575 m)   Wt 215 lb 4.8 oz (97.7 kg)   SpO2 96%   BMI 39.38 kg/m  BP Readings from Last 3 Encounters:  10/15/22 126/80  09/12/22 (!) 138/94  06/24/22 122/76   Wt Readings from Last 3 Encounters:  10/15/22 215 lb 4.8 oz (97.7 kg)  09/12/22 217 lb (98.4 kg)  06/24/22 217 lb 3.2 oz (98.5 kg)      Physical Exam Vitals reviewed.  Constitutional:      Appearance: Normal appearance.  Cardiovascular:     Rate and Rhythm: Normal rate and regular rhythm.  Pulmonary:     Effort: Pulmonary effort is normal.     Breath sounds: Normal breath sounds. No wheezing or rales.  Musculoskeletal:     Comments: Left fifth digit reveals some swelling of the DIP joint.  Minimal tenderness.  She lacks just to few degrees of full extension at the DIP joint.  Neurological:     Mental Status: She is alert.      No results found for any visits on 10/15/22.    The 10-year ASCVD risk score (Arnett DK, et al., 2019) is: 7.6%    Assessment & Plan:   #1 cough.  Nonfocal exam.  No clinical evidence to suggest pneumonia.  Suspect recent viral bronchitis.  Refill albuterol MDI to use 2 puffs every 6 hours as needed for wheeze.  Follow-up immediately for any shortness of breath or fever  #2 hyperlipidemia.  Recent lipids well-controlled.  Refill rosuvastatin for 1 year  #3 left fifth digit injury.  She probably has some mild synovitis of the DIP joint.  We did mention that she could have even avulsed tendon slip at the DIP but this was over 2 months ago.  No further intervention recommended at this time.  Tiffany Littler, MD

## 2022-10-16 MED ORDER — BENZONATATE 100 MG PO CAPS: ORAL_CAPSULE | ORAL | 0 refills | Status: DC

## 2022-10-16 NOTE — Telephone Encounter (Signed)
Rx sent 

## 2022-10-19 ENCOUNTER — Other Ambulatory Visit: Payer: Self-pay | Admitting: Family Medicine

## 2022-10-19 DIAGNOSIS — I10 Essential (primary) hypertension: Secondary | ICD-10-CM

## 2022-10-22 ENCOUNTER — Other Ambulatory Visit: Payer: Self-pay

## 2022-10-22 ENCOUNTER — Encounter: Payer: Self-pay | Admitting: Hematology and Oncology

## 2022-10-22 ENCOUNTER — Inpatient Hospital Stay (HOSPITAL_BASED_OUTPATIENT_CLINIC_OR_DEPARTMENT_OTHER): Payer: Medicare Other | Admitting: Hematology and Oncology

## 2022-10-22 VITALS — BP 132/74 | HR 95 | Temp 97.7°F | Resp 18 | Ht 62.0 in | Wt 217.0 lb

## 2022-10-22 DIAGNOSIS — Z170 Estrogen receptor positive status [ER+]: Secondary | ICD-10-CM | POA: Diagnosis not present

## 2022-10-22 DIAGNOSIS — Z79810 Long term (current) use of selective estrogen receptor modulators (SERMs): Secondary | ICD-10-CM | POA: Diagnosis not present

## 2022-10-22 DIAGNOSIS — C50212 Malignant neoplasm of upper-inner quadrant of left female breast: Secondary | ICD-10-CM

## 2022-10-22 DIAGNOSIS — M7989 Other specified soft tissue disorders: Secondary | ICD-10-CM | POA: Diagnosis not present

## 2022-10-22 DIAGNOSIS — C50312 Malignant neoplasm of lower-inner quadrant of left female breast: Secondary | ICD-10-CM | POA: Diagnosis present

## 2022-10-22 DIAGNOSIS — Z923 Personal history of irradiation: Secondary | ICD-10-CM | POA: Diagnosis not present

## 2022-10-22 NOTE — Progress Notes (Signed)
[place]  Telephone:([phone] Fax:([phone]    ID: Tiffany Montes DOB: 1956-02-11  MR#: [id number]  YME#:[id number]  Patient Care Team: Eulas Post, MD as PCP - General Magrinat, Virgie Dad, MD (Inactive) as Consulting Physician (Oncology) Kyung Rudd, MD as Consulting Physician (Radiation Oncology) Maisie Fus, MD (Inactive) as Consulting Physician (Obstetrics and Gynecology) Larey Dresser, MD as Consulting Physician (Cardiology) OTHER MD:    CHIEF COMPLAINT: Estrogen receptor positive breast cancer, HER-2 amplified  CURRENT TREATMENT: tamoxifen  INTERVAL HISTORY:  Tiffany Montes returns today for follow-up of her estrogen receptor positive and HER2 amplified breast cancer.   She was switched to tamoxifen at her last visit on 11/22/2020.   She is doing well on tamoxifen. She feels a bit fatigued, otherwise doing well. Mammogram Oct 2023, probably benign calcs with in the anterior right breast less conspicuous. Follow up diag mammogram recommended in 12 months. Rest of the pertinent 10 point ROS reviewed and neg.  COVID 19 VACCINATION STATUS: Status post Pfizer x2 with booster August 28, 2020   HISTORY OF CURRENT ILLNESS: From the original intake note:  "Tiffany Montes" had routine screening mammography on 07/22/2018 showing a possible abnormality in the left breast. She underwent unilateral diagnostic mammography with tomography and left breast ultrasonography at [facility] on 07/28/2018 showing: Breast density Category C; a 7 x 8 x 8 mm hypoechoic mass in the left breast lower inner quadrant, consistent with a complex cyst.. No abnormal lymph nodes in the left axilla.  Accordingly on 07/31/2018 she proceeded to biopsy of the left breast area in question. The pathology from this procedure showed ([id number]): Invasive Ductal Carcinoma, Grade 3. Prognostic indicators significant for: estrogen receptor, 95% positive and progesterone receptor, 85%  positive, both with strong staining intensity. Proliferation marker Ki67 at 75%. HER2 positive by immunohistochemistry, 3+.   The patient's subsequent history is as detailed below.   PAST MEDICAL HISTORY: Past Medical History:  Diagnosis Date   ADHD    Anxiety    Attention deficit disorder    Back pain    Breast cancer ([facility]) 2019   Left Breast Cancer   Depression    Gallbladder problem    Gallstones 09/05/2011   Hyperlipidemia    Hypertension    Hypothyroidism    Joint pain    Migraines    Neuropathy    Personal history of chemotherapy 2019   Left Breast Cancer   Personal history of radiation therapy 2019   Left Breast Cancer   PONV (postoperative nausea and vomiting)    diffficulty waking up   Thyroid disease    hypothyroid    PAST SURGICAL HISTORY: Past Surgical History:  Procedure Laterality Date   ABDOMINAL HYSTERECTOMY  2003   BREAST BIOPSY Left 2014   benign   BREAST LUMPECTOMY Left 09/07/2018   BREAST LUMPECTOMY WITH RADIOACTIVE SEED AND SENTINEL LYMPH NODE BIOPSY Left 09/07/2018   Procedure: LEFT BREAST LUMPECTOMY WITH RADIOACTIVE SEED AND AXILLARY SENTINEL LYMPH NODE BIOPSY,INJECT BLUE DYE LEFT BREAST;  Surgeon: Fanny Skates, MD;  Location: [facility];  Service: General;  Laterality: Left;   CHOLECYSTECTOMY  09/26/2011   Procedure: LAPAROSCOPIC CHOLECYSTECTOMY WITH INTRAOPERATIVE CHOLANGIOGRAM;  Surgeon: Haywood Lasso, MD;  Location: [facility];  Service: General;  Laterality: N/A;   COLONOSCOPY     CYSTIC HYGROMA EXCISION     DENTAL SURGERY     skin graft from top of mouth to lower gums   PORT-A-CATH REMOVAL Right 11/04/2019  Procedure: REMOVAL PORT-A-CATH;  Surgeon: Donnie Mesa, MD;  Location: [facility];  Service: General;  Laterality: Right;   PORTACATH PLACEMENT N/A 09/07/2018   Procedure: INSERTION PORT-A-CATH WITH [place];  Surgeon: Fanny Skates, MD;  Location: [facility];  Service: General;  Laterality: N/A;   REPLACEMENT TOTAL KNEE Right      FAMILY HISTORY: Family History  Problem Relation Age of Onset   Diabetes Father    Heart disease Father    Lung cancer Father    Hypertension Father    Hyperlipidemia Father    Thyroid disease Father    Alcoholism Father    Hypertension Mother    Thyroid disease Mother    Breast cancer Neg Hx    Ovarian cancer Neg Hx   She notes that her father died from CHF at age 60.  He was diagnosed with lung cancer at age 66. Patients' mother is 44 years old as of November 2019. The patient has 1 brother and 2 sisters. Patient denies a family history of ovarian or breast cancer.   GYNECOLOGIC HISTORY:  No LMP recorded. Patient has had a hysterectomy. 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 She has been unable to work there more recently for a variety of medical issues.  She is divorced and lives by herself with her 2 dogs. Her daughter [name] is a Dealer in [place], [name].  The patient's son, [name] is a Training and development officer and lives in [place]. The patient has 2 grandchildren and 1 great-grand child.  She is not a church attender   ADVANCED DIRECTIVES: Her [place] is her daughter, [name], [phone].     HEALTH MAINTENANCE: Social History   Tobacco Use   Smoking status: Never   Smokeless tobacco: Never  Vaping Use   Vaping Use: Never used  Substance Use Topics   Alcohol use: Not Currently    Comment: 1-2 a week and reports not every week   Drug use: No    Colonoscopy: 05/2019, Dr. Earlean Shawl, 1 adenoma  PAP: 1 month ago  Bone density: Yes, 2 years ago   Allergies  Allergen Reactions   Morphine Sulfate Nausea And Vomiting and Rash    GI upset    Current Outpatient Medications  Medication  Sig Dispense Refill   albuterol (VENTOLIN HFA) 108 (90 Base) MCG/ACT inhaler Inhale 2 puffs into the lungs every 6 (six) hours as needed for wheezing or shortness of breath. 8 g 0   ALPRAZolam (XANAX) 0.5 MG tablet TAKE 1 TABLET BY MOUTH AT BEDTIME AS NEEDED FOR ANXIETY 30 tablet 2   amphetamine-dextroamphetamine (ADDERALL) 30 MG tablet Take 1 tablet by mouth daily. 30 tablet 0   amphetamine-dextroamphetamine (ADDERALL) 30 MG tablet Take 1 tablet by mouth daily. 30 tablet 0   amphetamine-dextroamphetamine (ADDERALL) 30 MG tablet Take 1 tablet by mouth daily. 30 tablet 0   benzonatate (TESSALON) 100 MG capsule TAKE 1 CAPSULE BY MOUTH 2 TIMES DAILY AS NEEDED FOR COUGH 40 capsule 0   buPROPion (WELLBUTRIN XL) 300 MG 24 hr tablet TAKE ONE TABLET BY MOUTH ONE TIME DAILY 90 tablet 0   fluticasone (FLONASE) 50 MCG/ACT nasal spray SPRAY 2 SPRAYS INTO EACH NOSTRIL DAILY AS NEEDED FOR ALLERGIES  48 mL 0   gabapentin (NEURONTIN) 300 MG capsule Take 1 capsule (300 mg total) by mouth at bedtime. 90 capsule 3   levothyroxine (SYNTHROID) 150 MCG tablet TAKE 1 TABLET BY MOUTH EVERY DAY BEFORE BREAKFAST 90 tablet 3   losartan-hydrochlorothiazide (HYZAAR) 100-12.5 MG tablet TAKE 1 TABLET BY MOUTH EVERY DAY 90 tablet 0   neomycin-polymyxin b-dexamethasone (MAXITROL) 3.5-10000-0.1 OINT      rosuvastatin (CRESTOR) 20 MG tablet Take 1 tablet (20 mg total) by mouth daily. 90 tablet 3   sertraline (ZOLOFT) 100 MG tablet TAKE 1 TABLET BY MOUTH EVERY DAY 90 tablet 1   SUMAtriptan (IMITREX) 100 MG tablet TAKE 1 TABLET BY MOUTH AS NEEDED FOR MIGRAINE *MAY REPEAT IN 24 HOURS AS DIRECTED 12 tablet 1   tamoxifen (NOLVADEX) 20 MG tablet TAKE 1 TABLET BY MOUTH EVERY DAY 90 tablet 3   trimethoprim-polymyxin b (POLYTRIM) ophthalmic solution Place 2 drops into the right eye every 4 (four) hours. 10 mL 0   Vitamin D-Vitamin K (VITAMIN K2-VITAMIN D3 PO) Take 5,000 Units by mouth.     No current facility-administered medications for  this visit.    OBJECTIVE: White woman who appears stated age  87:   10/22/22 1506  BP: 132/74  Pulse: 95  Resp: 18  Temp: 97.7 F (36.5 C)  SpO2: 100%     Body mass index is 39.69 kg/m.   Wt Readings from Last 3 Encounters:  10/22/22 217 lb (98.4 kg)  10/15/22 215 lb 4.8 oz (97.7 kg)  09/12/22 217 lb (98.4 kg)  ECOG FS:1  Physical Exam Constitutional:      Appearance: Normal appearance.  Chest:     Comments: Bilateral breasts inspected.  No palpable masses or regional adenopathy Musculoskeletal:        General: No swelling or tenderness. Normal range of motion.     Cervical back: Normal range of motion and neck supple. No rigidity.  Lymphadenopathy:     Cervical: No cervical adenopathy.  Neurological:     Mental Status: She is alert.     LAB RESULTS:  CMP     Component Value Date/Time   NA 140 06/24/2022 1130   K 4.5 06/24/2022 1130   CL 104 06/24/2022 1130   CO2 27 06/24/2022 1130   GLUCOSE 115 (H) 06/24/2022 1130   BUN 16 06/24/2022 1130   CREATININE 0.89 06/24/2022 1130   CREATININE 0.78 04/03/2022 1346   CALCIUM 9.1 06/24/2022 1130   PROT 6.8 06/24/2022 1130   ALBUMIN 4.1 06/24/2022 1130   AST 16 06/24/2022 1130   AST 15 04/03/2022 1346   ALT 11 06/24/2022 1130   ALT 11 04/03/2022 1346   ALKPHOS 50 06/24/2022 1130   BILITOT 0.3 06/24/2022 1130   BILITOT 0.3 04/03/2022 1346   GFRNONAA >60 04/03/2022 1346   GFRAA >60 04/26/2020 1323   GFRAA >60 01/22/2019 0915    Lab Results  Component Value Date   WBC 6.3 06/24/2022   NEUTROABS 4.2 06/24/2022   HGB 13.5 06/24/2022   HCT 41.0 06/24/2022   MCV 87.7 06/24/2022   PLT 186.0 06/24/2022   No results found for: "LABCA2"  No components found for: "ZESPQZ300"  No results for input(s): "INR" in the last 168 hours.  No results found for: "LABCA2"  No results found for: "TMA263"  No results found for: "CAN125"  No results found for: "CAN153"  No results found for: "CA2729"  No  components found for: "HGQUANT"  No results found for: "CEA1", "  CEA" / No results found for: "CEA1", "CEA"   No results found for: "AFPTUMOR"  No results found for: "CHROMOGRNA"  No results found for: "TOTALPROTELP", "ALBUMINELP", "A1GS", "A2GS", "BETS", "BETA2SER", "GAMS", "MSPIKE", "SPEI" (this displays SPEP labs)  No results found for: "KPAFRELGTCHN", "LAMBDASER", "KAPLAMBRATIO" (kappa/lambda light chains)  No results found for: "HGBA", "HGBA2QUANT", "HGBFQUANT", "HGBSQUAN" (Hemoglobinopathy evaluation)   No results found for: "LDH"  No results found for: "IRON", "TIBC", "IRONPCTSAT" (Iron and TIBC)  Lab Results  Component Value Date   FERRITIN 48 11/23/2020    Urinalysis    Component Value Date/Time   COLORURINE YELLOW 09/13/2011 [place] 09/13/2011 0952   LABSPEC 1.010 09/13/2011 0952   PHURINE 6.5 09/13/2011 0952   GLUCOSEU NEGATIVE 09/13/2011 [place] 09/13/2011 0952   HGBUR negative 04/03/2009 0949   BILIRUBINUR n 09/15/2012 0832   KETONESUR NEGATIVE 09/13/2011 0952   PROTEINUR n 09/15/2012 0832   PROTEINUR NEGATIVE 09/13/2011 0952   UROBILINOGEN 0.2 09/15/2012 0832   UROBILINOGEN 0.2 09/13/2011 0952   NITRITE n 09/15/2012 0832   NITRITE NEGATIVE 09/13/2011 0952   LEUKOCYTESUR Negative 09/15/2012 0832    STUDIES:  No results found.   ELIGIBLE FOR AVAILABLE RESEARCH PROTOCOL: no   ASSESSMENT: 67 y.o. [place] woman status post left breast lower inner quadrant biopsy 07/31/2018 for a clinical T1b N0, stage IA invasive ductal carcinoma, grade 3, triple positive, with an MIB-1 of 75%.  (1) status post left lumpectomy and sentinel lymph node sampling 09/07/2018 for a pT1c pN0, stage IA invasive ductal carcinoma, grade 3, with negative margins.  (a) a total of 5 sentinel lymph nodes were removed  (2) adjuvant chemotherapy consisting of paclitaxel weekly x12 with trastuzumab every 21 days starting 10/02/2018, completed  12/25/2018  (3) trastuzumab continued to complete 1 year (through December 2020).  (a) echocardiogram 08/17/2018 shows an ejection fraction of 60-65%  (b) echocardiogram on 11/17/2018 shows an ejection fraction of 60-65%  (c) echo 02/08/2019 shows an ejection fraction in the 60-65%  (d) Echo in 05/2019 shows EF of 60-65%  (e) Echo on 09/16/2019 shows EF of 60-65%  (4) adjuvant radiation 02/04/2019 - 03/04/2019  (a) Left breast treated to 42.56 Gy in 16 fractions  (b) Seroma boost of 8 Gy in 4 fractions, for a total of 50.56 Gy  (5) started anastrozole 04/11/2019, discontinued February 2022 with possible neuropathy  (a) bone density scan 11/05/2019 showed a T score of -0.3, which is normal  (b) tamoxifen started February 2022   PLAN:  She is here for follow up on tamoxifen. Physical examination today without any concerns. She has baseline RLE swelling since knee replacement. Mammogram Oct 2023 No concerns, repeat mammogram in one yr. She will continue Tamoxifen till July 2025 Mammogram ordered for Oct 2024. Overall she is doing very well.  Total time spent: 30 minutes  *Total Encounter Time as defined by the Centers for Medicare and Medicaid Services includes, in addition to the face-to-face time of a patient visit (documented in the note above) non-face-to-face time: obtaining and reviewing outside history, ordering and reviewing medications, tests or procedures, care coordination (communications with other health care professionals or caregivers) and documentation in the medical record.

## 2022-10-28 ENCOUNTER — Other Ambulatory Visit: Payer: Self-pay | Admitting: Family Medicine

## 2022-10-29 ENCOUNTER — Telehealth: Payer: Self-pay | Admitting: Family Medicine

## 2022-10-29 MED ORDER — AMPHETAMINE-DEXTROAMPHETAMINE 30 MG PO TABS: 30.0000 mg | ORAL_TABLET | Freq: Every day | ORAL | 0 refills | Status: DC

## 2022-10-29 NOTE — Telephone Encounter (Signed)
Pt is calling and would like a refill on amphetamine-dextroamphetamine (ADDERALL) 30 MG tablet  [id number] [place], [place] [facility] [address]: [phone] Fax: [phone]

## 2022-11-10 ENCOUNTER — Other Ambulatory Visit: Payer: Self-pay | Admitting: Family Medicine

## 2022-12-11 ENCOUNTER — Other Ambulatory Visit: Payer: Self-pay | Admitting: Family Medicine

## 2022-12-22 ENCOUNTER — Other Ambulatory Visit: Payer: Self-pay | Admitting: Family Medicine

## 2023-01-30 ENCOUNTER — Other Ambulatory Visit: Payer: Self-pay | Admitting: Family Medicine

## 2023-01-30 DIAGNOSIS — R059 Cough, unspecified: Secondary | ICD-10-CM

## 2023-01-30 DIAGNOSIS — I10 Essential (primary) hypertension: Secondary | ICD-10-CM

## 2023-01-31 ENCOUNTER — Other Ambulatory Visit: Payer: Self-pay | Admitting: Family Medicine

## 2023-01-31 ENCOUNTER — Other Ambulatory Visit: Payer: Self-pay | Admitting: Family

## 2023-01-31 DIAGNOSIS — R059 Cough, unspecified: Secondary | ICD-10-CM

## 2023-01-31 MED ORDER — AMPHETAMINE-DEXTROAMPHETAMINE 30 MG PO TABS: 30.0000 mg | ORAL_TABLET | Freq: Every day | ORAL | 0 refills | Status: DC

## 2023-01-31 NOTE — Telephone Encounter (Signed)
Prescription Request  01/31/2023  LOV: 10/15/2022  What is the name of the medication or equipment? amphetamine-dextroamphetamine (ADDERALL) 30 MG tablet , benzonatate (TESSALON) 100 MG capsule   Have you contacted your pharmacy to request a refill? No   Which pharmacy would you like this sent to?  [id number] - [place], [place] - [address] AT [address] OF [facility] [address] [place] [address] Phone: [phone] Fax: [phone]   Patient notified that their request is being sent to the clinical staff for review and that they should receive a response within 2 business days.   Please advise at Mobile [phone] (mobile)

## 2023-02-19 ENCOUNTER — Other Ambulatory Visit: Payer: Self-pay | Admitting: Hematology and Oncology

## 2023-02-21 NOTE — Telephone Encounter (Signed)
error 

## 2023-03-10 ENCOUNTER — Other Ambulatory Visit: Payer: Self-pay | Admitting: Family Medicine

## 2023-03-10 DIAGNOSIS — R059 Cough, unspecified: Secondary | ICD-10-CM

## 2023-03-10 MED ORDER — BENZONATATE 100 MG PO CAPS
ORAL_CAPSULE | ORAL | 0 refills | Status: DC
Start: 2023-03-10 — End: 2023-09-11

## 2023-03-10 NOTE — Telephone Encounter (Signed)
Prescription Request  03/10/2023  LOV: 10/15/2022  What is the name of the medication or equipment?     amphetamine-dextroamphetamine (ADDERALL) 30 MG tablet ALPRAZolam (XANAX) 0.5 MG tablet enzonatate (TESSALON) 100 MG capsule   Have you contacted your pharmacy to request a refill? Yes   Which pharmacy would you like this sent to?  [id number] - [place], [place] - [address] AT [address] [facility] [address] Phone: [phone]  Fax: [phone]     Patient notified that their request is being sent to the clinical staff for review and that they should receive a response within 2 business days.   Please advise at Mobile [phone] (mobile)

## 2023-03-11 MED ORDER — AMPHETAMINE-DEXTROAMPHETAMINE 30 MG PO TABS: 30.0000 mg | ORAL_TABLET | Freq: Every day | ORAL | 0 refills | Status: DC

## 2023-03-11 MED ORDER — ALPRAZOLAM 0.5 MG PO TABS: ORAL_TABLET | ORAL | 2 refills | Status: DC

## 2023-03-25 MED ORDER — AMPHETAMINE-DEXTROAMPHETAMINE 30 MG PO TABS: 30.0000 mg | ORAL_TABLET | Freq: Every day | ORAL | 0 refills | Status: DC

## 2023-04-04 ENCOUNTER — Other Ambulatory Visit: Payer: Self-pay | Admitting: Family Medicine

## 2023-04-11 ENCOUNTER — Other Ambulatory Visit: Payer: Self-pay | Admitting: Family Medicine

## 2023-04-11 NOTE — Telephone Encounter (Signed)
Prescription Request  04/11/2023  LOV: 10/15/2022  What is the name of the medication or equipment? amphetamine-dextroamphetamine amphetamine-dextroamphetamine (ADDERALL) 30 MG tablet  Have you contacted your pharmacy to request a refill? No   Which pharmacy would you like this sent to?   [id number] - [place], [place] - [address] AT [address] [facility] [address] Phone: [phone]  Fax: [phone]       Patient notified that their request is being sent to the clinical staff for review and that they should receive a response within 2 business days.   Please advise at Mobile [phone] (mobile)

## 2023-04-14 ENCOUNTER — Ambulatory Visit (INDEPENDENT_AMBULATORY_CARE_PROVIDER_SITE_OTHER): Payer: Medicare Other | Admitting: Family Medicine

## 2023-04-14 ENCOUNTER — Encounter: Payer: Self-pay | Admitting: Family Medicine

## 2023-04-14 VITALS — BP 134/80 | HR 99 | Temp 98.1°F | Ht 62.0 in | Wt 203.3 lb

## 2023-04-14 DIAGNOSIS — E039 Hypothyroidism, unspecified: Secondary | ICD-10-CM

## 2023-04-14 DIAGNOSIS — R7303 Prediabetes: Secondary | ICD-10-CM | POA: Diagnosis not present

## 2023-04-14 DIAGNOSIS — F439 Reaction to severe stress, unspecified: Secondary | ICD-10-CM | POA: Diagnosis not present

## 2023-04-14 DIAGNOSIS — F3289 Other specified depressive episodes: Secondary | ICD-10-CM | POA: Diagnosis not present

## 2023-04-14 NOTE — Progress Notes (Signed)
Established Patient Office Visit  Subjective   Patient ID: Tiffany Montes, female    DOB: 1956/07/22  Age: 67 y.o. MRN: [id number]  Chief Complaint  Patient presents with   Medical Management of Chronic Issues    HPI   Patient seen for several issues as follows  Incredible stress this past year.  Her son who is 13 had very complicated case of sepsis related to hip infection and was hospitalized for almost 2 months.  He currently lives with her.  He has longstanding history of drug addiction.  This has been very stressful for her.  She had recurrent depression and currently is treated with Wellbutrin extended release and sertraline.  No suicidal ideation.  She wonders if her thyroid may be "off ".  She takes levothyroxine regularly.  Her last TSH was normal range but this was several months ago.  She would like to consider possible counseling.  She retired couple years ago.  She does go to lunch occasionally with some high school friends and the neighbor but otherwise does not have a lot of interactions  History of prediabetes range blood sugars.  Last A1c 6.3%.  She had difficulty losing weight in the past.  Past Medical History:  Diagnosis Date   ADHD    Anxiety    Attention deficit disorder    Back pain    Breast cancer ([facility]) 2019   Left Breast Cancer   Depression    Gallbladder problem    Gallstones 09/05/2011   Hyperlipidemia    Hypertension    Hypothyroidism    Joint pain    Migraines    Neuropathy    Personal history of chemotherapy 2019   Left Breast Cancer   Personal history of radiation therapy 2019   Left Breast Cancer   PONV (postoperative nausea and vomiting)    diffficulty waking up   Thyroid disease    hypothyroid   Past Surgical History:  Procedure Laterality Date   ABDOMINAL HYSTERECTOMY  2003   BREAST BIOPSY Left 2014   benign   BREAST LUMPECTOMY Left 09/07/2018   BREAST LUMPECTOMY WITH RADIOACTIVE SEED AND SENTINEL LYMPH NODE BIOPSY  Left 09/07/2018   Procedure: LEFT BREAST LUMPECTOMY WITH RADIOACTIVE SEED AND AXILLARY SENTINEL LYMPH NODE BIOPSY,INJECT BLUE DYE LEFT BREAST;  Surgeon: Claud Kelp, MD;  [facility];  Service: General;  Laterality: Left;   CHOLECYSTECTOMY  09/26/2011   Procedure: LAPAROSCOPIC CHOLECYSTECTOMY WITH INTRAOPERATIVE CHOLANGIOGRAM;  Surgeon: Currie Paris, MD;  Location[facility];  Service: General;  Laterality: N/A;   COLONOSCOPY     CYSTIC HYGROMA EXCISION     DENTAL SURGERY     skin graft from top of mouth to lower gums   PORT-A-CATH REMOVAL Right 11/04/2019   Procedure: REMOVAL PORT-A-CATH;  Surgeon: Manus Rudd, MD;  Location: [facility];  Service: General;  Laterality: Right;   PORTACATH PLACEMENT N/A 09/07/2018   Procedure: INSERTION PORT-A-CATH WITH [place];  Surgeon: Claud Kelp, MD;  Location: [facility];  Service: General;  Laterality: N/A;   REPLACEMENT TOTAL KNEE Right     reports that she has never smoked. She has never used smokeless tobacco. She reports that she does not currently use alcohol. She reports that she does not use drugs. family history includes Alcoholism in her father; Diabetes in her father; Heart disease in her father; Hyperlipidemia in her father; Hypertension in her father and mother; Lung cancer in her father; Thyroid disease in her father and mother.  Allergies  Allergen Reactions   Morphine Sulfate Nausea And Vomiting and Rash    GI upset    Review of Systems  Constitutional:  Positive for malaise/fatigue.  Eyes:  Negative for blurred vision.  Respiratory:  Negative for shortness of breath.   Cardiovascular:  Negative for chest pain.  Neurological:  Negative for dizziness, weakness and headaches.  Psychiatric/Behavioral:  Positive for depression. Negative for suicidal ideas. The patient is nervous/anxious and has insomnia.       Objective:     BP 134/80 (BP Location: Left Arm, Patient Position: Sitting, Cuff Size: Large)   Pulse 99    Temp 98.1 F (36.7 C) (Oral)   Ht 5\' 2"  (1.575 m)   Wt 203 lb 4.8 oz (92.2 kg)   SpO2 99%   BMI 37.18 kg/m  BP Readings from Last 3 Encounters:  04/14/23 134/80  10/22/22 132/74  10/15/22 126/80   Wt Readings from Last 3 Encounters:  04/14/23 203 lb 4.8 oz (92.2 kg)  10/22/22 217 lb (98.4 kg)  10/15/22 215 lb 4.8 oz (97.7 kg)      Physical Exam Vitals reviewed.  Constitutional:      Appearance: She is well-developed.  Eyes:     Pupils: Pupils are equal, round, and reactive to light.  Neck:     Thyroid: No thyromegaly.     Vascular: No JVD.  Cardiovascular:     Rate and Rhythm: Normal rate and regular rhythm.     Heart sounds:     No gallop.  Pulmonary:     Effort: Pulmonary effort is normal. No respiratory distress.     Breath sounds: Normal breath sounds. No wheezing or rales.  Musculoskeletal:     Cervical back: Neck supple.  Neurological:     General: No focal deficit present.     Mental Status: She is alert.      No results found for any visits on 04/14/23.  Last CBC Lab Results  Component Value Date   WBC 6.3 06/24/2022   HGB 13.5 06/24/2022   HCT 41.0 06/24/2022   MCV 87.7 06/24/2022   MCH 28.9 04/03/2022   RDW 13.3 06/24/2022   PLT 186.0 06/24/2022   Last metabolic panel Lab Results  Component Value Date   GLUCOSE 115 (H) 06/24/2022   NA 140 06/24/2022   K 4.5 06/24/2022   CL 104 06/24/2022   CO2 27 06/24/2022   BUN 16 06/24/2022   CREATININE 0.89 06/24/2022   GFR 67.11 06/24/2022   CALCIUM 9.1 06/24/2022   PROT 6.8 06/24/2022   ALBUMIN 4.1 06/24/2022   BILITOT 0.3 06/24/2022   ALKPHOS 50 06/24/2022   AST 16 06/24/2022   ALT 11 06/24/2022   ANIONGAP 7 04/03/2022   Last lipids Lab Results  Component Value Date   CHOL 145 06/24/2022   HDL 59.70 06/24/2022   LDLCALC 71 06/24/2022   LDLDIRECT 136.7 09/15/2012   TRIG 71.0 06/24/2022   CHOLHDL 2 06/24/2022   Last hemoglobin A1c Lab Results  Component Value Date   HGBA1C 6.3  06/24/2022   Last thyroid functions Lab Results  Component Value Date   TSH 1.52 06/24/2022      The 10-year ASCVD risk score (Arnett DK, et al., 2019) is: 9.7%    Assessment & Plan:   #1 history of prediabetes.  Last A1c 6.3%.  Recheck A1c today.  Reduce high glycemic foods.  She is encouraged to lose some weight. If A1c climbing consider GLP-1 medication option  #  2 situational stress.  Strongly advocated counseling.  She is dealing with a lot of stress issues regarding her 53 year old son living with her and he has drug abuse history and currently physically disabled and not working.  #3 hypothyroidism-recheck TSH.  Future lab order placed   Return in about 3 months (around 07/15/2023).    [name], MD

## 2023-04-14 NOTE — Patient Instructions (Signed)
Set up counseling as discussed.

## 2023-04-15 LAB — TSH: TSH: 3.83 u[IU]/mL (ref 0.35–5.50)

## 2023-04-15 LAB — HEMOGLOBIN A1C: Hgb A1c MFr Bld: 6 % (ref 4.6–6.5)

## 2023-04-25 NOTE — Telephone Encounter (Signed)
Left message for patient to return my call x 2.

## 2023-04-28 ENCOUNTER — Telehealth: Payer: Self-pay | Admitting: Family Medicine

## 2023-04-28 NOTE — Telephone Encounter (Signed)
Prescription Request  04/28/2023  LOV: 04/14/2023  What is the name of the medication or equipment? amphetamine-dextroamphetamine (ADDERALL) 30 MG tablet  ALPRAZolam (XANAX) 0.5 MG tablet   Have you contacted your pharmacy to request a refill? No   Which pharmacy would you like this sent to?  [id number] [place], [place] - [address] AT [address] OF [facility] [address] [place] [address] Phone: [phone] Fax: [phone]     Patient notified that their request is being sent to the clinical staff for review and that they should receive a response within 2 business days.   Please advise at Mobile [phone] (mobile)

## 2023-04-29 ENCOUNTER — Other Ambulatory Visit: Payer: Self-pay | Admitting: Family Medicine

## 2023-04-29 ENCOUNTER — Other Ambulatory Visit: Payer: Self-pay | Admitting: Hematology and Oncology

## 2023-04-29 DIAGNOSIS — R059 Cough, unspecified: Secondary | ICD-10-CM

## 2023-04-29 DIAGNOSIS — I10 Essential (primary) hypertension: Secondary | ICD-10-CM

## 2023-04-29 NOTE — Telephone Encounter (Signed)
Patient informed that rx for both medications were sent in for 3 months supply on 03/11/2023 and to contact pharmacy regarding refills.

## 2023-05-19 ENCOUNTER — Telehealth: Payer: Self-pay

## 2023-05-19 NOTE — Telephone Encounter (Signed)
Contacted patient on preferred number listed in notes for scheduled AWV. Patient unable to complete visit today will call to reschedule.

## 2023-05-23 ENCOUNTER — Other Ambulatory Visit: Payer: Self-pay | Admitting: Family Medicine

## 2023-05-29 ENCOUNTER — Other Ambulatory Visit: Payer: Self-pay | Admitting: Family Medicine

## 2023-06-05 ENCOUNTER — Other Ambulatory Visit: Payer: Self-pay | Admitting: Family Medicine

## 2023-06-05 DIAGNOSIS — R059 Cough, unspecified: Secondary | ICD-10-CM

## 2023-06-05 NOTE — Telephone Encounter (Signed)
Prescription Request  06/05/2023  LOV: 04/14/2023  What is the name of the medication or equipment? amphetamine-dextroamphetamine amphetamine-dextroamphetamine (ADDERALL) 30 MG tablet  Have you contacted your pharmacy to request a refill?  No  Pt states she only has 2 pills left. Pt informed MD is OOO on Thursdays.    Which pharmacy would you like this sent to?   [id number] - [place], [place] - [address] AT [address] OF [address] [place] [address]  Phone: [phone] Fax: [phone]  Patient notified that their request is being sent to the clinical staff for review and that they should receive a response within 2 business days.   Please advise at Mobile [phone] (mobile)

## 2023-06-08 MED ORDER — AMPHETAMINE-DEXTROAMPHETAMINE 30 MG PO TABS: 30.0000 mg | ORAL_TABLET | Freq: Every day | ORAL | 0 refills | Status: DC

## 2023-06-23 ENCOUNTER — Ambulatory Visit (INDEPENDENT_AMBULATORY_CARE_PROVIDER_SITE_OTHER): Payer: Medicare Other

## 2023-06-23 VITALS — Ht 62.0 in | Wt 203.0 lb

## 2023-06-23 DIAGNOSIS — Z0000 Encounter for general adult medical examination without abnormal findings: Secondary | ICD-10-CM | POA: Diagnosis not present

## 2023-06-23 NOTE — Progress Notes (Signed)
Subjective:   Tiffany Montes is a 67 y.o. female who presents for Medicare Annual (Subsequent) preventive examination.  Visit Complete: Virtual  I connected with  Tiffany Montes on 06/23/23 by a audio enabled telemedicine application and verified that I am speaking with the correct person using two identifiers.  Patient Location: Home  Provider Location: Home Office  I discussed the limitations of evaluation and management by telemedicine. The patient expressed understanding and agreed to proceed.  Cardiac Risk Factors include: advanced age (>57men, >27 women);hypertension     Objective:    Today's Vitals   06/23/23 1037  Weight: 203 lb (92.1 kg)  Height: 5\' 2"  (1.575 m)   Body mass index is 37.13 kg/m.     06/23/2023   10:48 AM 05/13/2022   10:48 AM 04/24/2021    9:56 AM 11/04/2019    1:42 PM 10/29/2019    4:18 PM 01/13/2019   10:06 AM 08/31/2018    2:15 PM  Advanced Directives  Does Patient Have a Medical Advance Directive? Yes Yes Yes Yes Yes Yes Yes  Type of Estate agent of [place];Living will Healthcare Power of [place];Living will Healthcare Power of [place];Living will Living will Living will Healthcare Power of [place];Living will Healthcare Power of Attorney  Does patient want to make changes to medical advance directive?    No - Patient declined No - Patient declined No - Patient declined   Copy of Healthcare Power of Attorney in Chart? No - copy requested No - copy requested No - copy requested No - copy requested No - copy requested  No - copy requested    Current Medications (verified) Outpatient Encounter Medications as of 06/23/2023  Medication Sig   albuterol (VENTOLIN HFA) 108 (90 Base) MCG/ACT inhaler Inhale 2 puffs into the lungs every 6 (six) hours as needed for wheezing or shortness of breath.   ALPRAZolam (XANAX) 0.5 MG tablet TAKE 1 TABLET BY MOUTH AT BEDTIME AS NEEDED FOR ANXIETY   amphetamine-dextroamphetamine  (ADDERALL) 30 MG tablet Take 1 tablet by mouth daily.   amphetamine-dextroamphetamine (ADDERALL) 30 MG tablet Take 1 tablet by mouth daily.   amphetamine-dextroamphetamine (ADDERALL) 30 MG tablet Take 1 tablet by mouth daily.   benzonatate (TESSALON) 100 MG capsule TAKE 1 CAPSULE BY MOUTH 2 TIMES DAILY AS NEEDED FOR COUGH   buPROPion (WELLBUTRIN XL) 300 MG 24 hr tablet TAKE ONE TABLET BY MOUTH ONE TIME DAILY   fluticasone (FLONASE) 50 MCG/ACT nasal spray SPRAY 2 SPRAYS INTO EACH NOSTRIL DAILY AS NEEDED FOR ALLERGIES   gabapentin (NEURONTIN) 300 MG capsule TAKE 1 CAPSULE BY MOUTH EVERYDAY AT BEDTIME   levothyroxine (SYNTHROID) 150 MCG tablet TAKE 1 TABLET BY MOUTH EVERY DAY BEFORE BREAKFAST   losartan-hydrochlorothiazide (HYZAAR) 100-12.5 MG tablet TAKE 1 TABLET BY MOUTH EVERY DAY   neomycin-polymyxin b-dexamethasone (MAXITROL) 3.5-10000-0.1 OINT    rosuvastatin (CRESTOR) 20 MG tablet Take 1 tablet (20 mg total) by mouth daily.   sertraline (ZOLOFT) 100 MG tablet TAKE 1 TABLET BY MOUTH EVERY DAY   SUMAtriptan (IMITREX) 100 MG tablet TAKE 1 TABLET BY MOUTH AS NEEDED FOR MIGRAINE *MAY REPEAT IN 24 HOURS AS DIRECTED   tamoxifen (NOLVADEX) 20 MG tablet TAKE 1 TABLET BY MOUTH EVERY DAY   trimethoprim-polymyxin b (POLYTRIM) ophthalmic solution Place 2 drops into the right eye every 4 (four) hours.   Vitamin D-Vitamin K (VITAMIN K2-VITAMIN D3 PO) Take 5,000 Units by mouth.   [DISCONTINUED] prochlorperazine (COMPAZINE) 10 MG tablet Take 1 tablet (  10 mg total) by mouth every 6 (six) hours as needed (Nausea or vomiting).   No facility-administered encounter medications on file as of 06/23/2023.    Allergies (verified) Morphine sulfate   History: Past Medical History:  Diagnosis Date   ADHD    Anxiety    Attention deficit disorder    Back pain    Breast cancer (HCC) 2019   Left Breast Cancer   Depression    Gallbladder problem    Gallstones 09/05/2011   Hyperlipidemia    Hypertension     Hypothyroidism    Joint pain    Migraines    Neuropathy    Personal history of chemotherapy 2019   Left Breast Cancer   Personal history of radiation therapy 2019   Left Breast Cancer   PONV (postoperative nausea and vomiting)    diffficulty waking up   Thyroid disease    hypothyroid   Past Surgical History:  Procedure Laterality Date   ABDOMINAL HYSTERECTOMY  2003   BREAST BIOPSY Left 2014   benign   BREAST LUMPECTOMY Left 09/07/2018   BREAST LUMPECTOMY WITH RADIOACTIVE SEED AND SENTINEL LYMPH NODE BIOPSY Left 09/07/2018   Procedure: LEFT BREAST LUMPECTOMY WITH RADIOACTIVE SEED AND AXILLARY SENTINEL LYMPH NODE BIOPSY,INJECT BLUE DYE LEFT BREAST;  Surgeon: Claud Kelp, MD;  Location: [facility] OR;  Service: General;  Laterality: Left;   CHOLECYSTECTOMY  09/26/2011   Procedure: LAPAROSCOPIC CHOLECYSTECTOMY WITH INTRAOPERATIVE CHOLANGIOGRAM;  Surgeon: Currie Paris, MD;  Location: [facility] OR;  Service: General;  Laterality: N/A;   COLONOSCOPY     CYSTIC HYGROMA EXCISION     DENTAL SURGERY     skin graft from top of mouth to lower gums   PORT-A-CATH REMOVAL Right 11/04/2019   Procedure: REMOVAL PORT-A-CATH;  Surgeon: Manus Rudd, MD;  Location:  SURGERY CENTER;  Service: General;  Laterality: Right;   PORTACATH PLACEMENT N/A 09/07/2018   Procedure: INSERTION PORT-A-CATH WITH [place];  Surgeon: Claud Kelp, MD;  [facility];  Service: General;  Laterality: N/A;   REPLACEMENT TOTAL KNEE Right    Family History  Problem Relation Age of Onset   Diabetes Father    Heart disease Father    Lung cancer Father    Hypertension Father    Hyperlipidemia Father    Thyroid disease Father    Alcoholism Father    Hypertension Mother    Thyroid disease Mother    Breast cancer Neg Hx    Ovarian cancer Neg Hx    Social History   Socioeconomic History   Marital status: Divorced    Spouse name: Not on file   Number of children: Not on file   Years of education: Not on file    Highest education level: Not on file  Occupational History   Occupation: retired  Tobacco Use   Smoking status: Never   Smokeless tobacco: Never  Vaping Use   Vaping status: Never Used  Substance and Sexual Activity   Alcohol use: Not Currently    Comment: 1-2 a week and reports not every week   Drug use: No   Sexual activity: Not Currently    Birth control/protection: Surgical  Other Topics Concern   Not on file  Social History Narrative   Not on file   Social Determinants of Health   Financial Resource Strain: Low Risk  (06/23/2023)   Overall Financial Resource Strain (CARDIA)    Difficulty of Paying Living Expenses: Not hard at all  Food Insecurity: No Food  Insecurity (06/23/2023)   Hunger Vital Sign    Worried About Running Out of Food in the Last Year: Never true    Ran Out of Food in the Last Year: Never true  Transportation Needs: No Transportation Needs (06/23/2023)   PRAPARE - Administrator, Civil Service (Medical): No    Lack of Transportation (Non-Medical): No  Physical Activity: Inactive (06/23/2023)   Exercise Vital Sign    Days of Exercise per Week: 0 days    Minutes of Exercise per Session: 0 min  Stress: Stress Concern Present (06/23/2023)   [employer] of Occupational Health - Occupational Stress Questionnaire    Feeling of Stress : Rather much  Social Connections: Socially Isolated (06/23/2023)   Social Connection and Isolation Panel [NHANES]    Frequency of Communication with Friends and Family: More than three times a week    Frequency of Social Gatherings with Friends and Family: More than three times a week    Attends Religious Services: Never    Database administrator or Organizations: No    Attends Engineer, structural: Never    Marital Status: Divorced    Tobacco Counseling Counseling given: Not Answered   Clinical Intake:  Pre-visit preparation completed: Yes  Pain : No/denies pain     BMI - recorded:  37.13 Nutritional Status: BMI > 30  Obese Nutritional Risks: None Diabetes: No  How often do you need to have someone help you when you read instructions, pamphlets, or other written materials from your doctor or pharmacy?: 1 - Never  Interpreter Needed?: No  Information entered by :: Theresa Mulligan LPN   Activities of Daily Living    06/23/2023   10:46 AM  In your present state of health, do you have any difficulty performing the following activities:  Hearing? 0  Vision? 0  Difficulty concentrating or making decisions? 0  Walking or climbing stairs? 0  Dressing or bathing? 0  Doing errands, shopping? 0  Preparing Food and eating ? N  Using the Toilet? N  In the past six months, have you accidently leaked urine? Y  Comment Wears pads. Followed by PCP  Do you have problems with loss of bowel control? N  Managing your Medications? N  Managing your Finances? N  Housekeeping or managing your Housekeeping? N    Patient Care Team: Kristian Covey, MD as PCP - General Magrinat, Valentino Hue, MD (Inactive) as Consulting Physician (Oncology) Dorothy Puffer, MD as Consulting Physician (Radiation Oncology) Freddy Finner, MD (Inactive) as Consulting Physician (Obstetrics and Gynecology) Laurey Morale, MD as Consulting Physician (Cardiology)  Indicate any recent Medical Services you may have received from other than [facility] providers in the past year (date may be approximate).     Assessment:   This is a routine wellness examination for Tiffany Montes.  Hearing/Vision screen Hearing Screening - Comments:: Denies hearing difficulties   Vision Screening - Comments:: Wears rx glasses - up to date with routine eye exams with  [facility]   Goals Addressed               This Visit's Progress     Lose weight (pt-stated)         Depression Screen    06/23/2023   10:44 AM 06/23/2023   10:42 AM 10/15/2022    3:19 PM 06/24/2022   10:31 AM 05/13/2022   10:51 AM 04/24/2021   10:00  AM 04/24/2021    9:57 AM  PHQ 2/9 Scores  PHQ - 2 Score 1 0 0 5 2 0 0  PHQ- 9 Score    13 4      Fall Risk    06/23/2023   10:47 AM 10/15/2022    3:19 PM 06/24/2022   10:35 AM 05/13/2022   10:51 AM 04/24/2021    9:57 AM  Fall Risk   Falls in the past year? 1 0 0 0 0  Number falls in past yr: 0 0 0 0 0  Injury with Fall? 0 0 0 0 0  Risk for fall due to : No Fall Risks No Fall Risks No Fall Risks Medication side effect   Follow up Falls prevention discussed Falls evaluation completed Falls evaluation completed Falls evaluation completed;Education provided;Falls prevention discussed Falls evaluation completed    MEDICARE RISK AT HOME: Medicare Risk at Home Any stairs in or around the home?: Yes If so, are there any without handrails?: No Home free of loose throw rugs in walkways, pet beds, electrical cords, etc?: Yes Adequate lighting in your home to reduce risk of falls?: Yes Life alert?: No Use of a cane, walker or w/c?: No Grab bars in the bathroom?: Yes Shower chair or bench in shower?: Yes Elevated toilet seat or a handicapped toilet?: No  TIMED UP AND GO:  Was the test performed?  No    Cognitive Function:        06/23/2023   10:48 AM 05/13/2022   10:58 AM  6CIT Screen  What Year? 0 points 0 points  What month? 0 points 0 points  What time? 0 points 0 points  Count back from 20 0 points 0 points  Months in reverse 0 points 0 points  Repeat phrase 0 points 8 points  Total Score 0 points 8 points    Immunizations Immunization History  Administered Date(s) Administered   Covid-19, Mrna,Vaccine(Spikevax)47yrs and older 11/02/2022   Fluad Quad(high Dose 65+) 08/28/2020, 06/19/2021, 06/24/2022   Influenza Split 08/08/2011, 08/24/2012   Influenza, Seasonal, Injecte, Preservative Fre 07/26/2014   Influenza,inj,Quad PF,6+ Mos 06/03/2017, 08/08/2018, 08/02/2019   Influenza-Unspecified 07/07/2016, 08/08/2018   PFIZER Comirnaty(Gray Top)Covid-19 Tri-Sucrose Vaccine  03/20/2021   PFIZER(Purple Top)SARS-COV-2 Vaccination 12/05/2019, 12/29/2019, 08/28/2020   Pneumococcal Conjugate-13 09/28/2018   Pneumococcal Polysaccharide-23 04/19/2020   Td 04/14/2009   Tdap 04/19/2019   Zoster Recombinant(Shingrix) 11/02/2022   Zoster, Live 12/04/2015    TDAP status: Up to date  Flu Vaccine status: Due, Education has been provided regarding the importance of this vaccine. Advised may receive this vaccine at local pharmacy or Health Dept. Aware to provide a copy of the vaccination record if obtained from local pharmacy or Health Dept. Verbalized acceptance and understanding.  Pneumococcal vaccine status: Up to date  Covid-19 vaccine status: Declined, Education has been provided regarding the importance of this vaccine but patient still declined. Advised may receive this vaccine at local pharmacy or Health Dept.or vaccine clinic. Aware to provide a copy of the vaccination record if obtained from local pharmacy or Health Dept. Verbalized acceptance and understanding.  Qualifies for Shingles Vaccine? Yes   Zostavax completed Yes   Shingrix Completed?: Yes  Screening Tests Health Maintenance  Topic Date Due   Zoster Vaccines- Shingrix (2 of 2) 12/28/2022   INFLUENZA VACCINE  05/08/2023   COVID-19 Vaccine (6 - 2023-24 season) 06/08/2023   MAMMOGRAM  08/03/2023   Medicare Annual Wellness (AWV)  06/22/2024   DTaP/Tdap/Td (3 - Td or Tdap) 04/18/2029   Colonoscopy  05/12/2029  Pneumonia Vaccine 16+ Years old  Completed   DEXA SCAN  Completed   Hepatitis C Screening  Completed   HPV VACCINES  Aged Out    Health Maintenance  Health Maintenance Due  Topic Date Due   Zoster Vaccines- Shingrix (2 of 2) 12/28/2022   INFLUENZA VACCINE  05/08/2023   COVID-19 Vaccine (6 - 2023-24 season) 06/08/2023    Colorectal cancer screening: Type of screening: Colonoscopy. Completed 05/13/19. Repeat every 10 years  Mammogram status: Completed 08/04/23. Repeat every year  Bone  Density status: Completed 11/05/19. Results reflect: Bone density results: OSTEOPOROSIS. Repeat every   years.    Additional Screening:  Hepatitis C Screening: does qualify; Completed 01/28/18  Vision Screening: Recommended annual ophthalmology exams for early detection of glaucoma and other disorders of the eye. Is the patient up to date with their annual eye exam?  Yes  Who is the provider or what is the name of the office in which the patient attends annual eye exams? [facility] If pt is not established with a provider, would they like to be referred to a provider to establish care? No .   Dental Screening: Recommended annual dental exams for proper oral hygiene    Community Resource Referral / Chronic Care Management:  CRR required this visit?  No   CCM required this visit?  No     Plan:     I have personally reviewed and noted the following in the patient's chart:   Medical and social history Use of alcohol, tobacco or illicit drugs  Current medications and supplements including opioid prescriptions. Patient is not currently taking opioid prescriptions. Functional ability and status Nutritional status Physical activity Advanced directives List of other physicians Hospitalizations, surgeries, and ER visits in previous 12 months Vitals Screenings to include cognitive, depression, and falls Referrals and appointments  In addition, I have reviewed and discussed with patient certain preventive protocols, quality metrics, and best practice recommendations. A written personalized care plan for preventive services as well as general preventive health recommendations were provided to patient.     Tillie Rung, LPN   [id number]   After Visit Summary: (MyChart) Due to this being a telephonic visit, the after visit summary with patients personalized plan was offered to patient via MyChart   Nurse Notes: None

## 2023-06-23 NOTE — Patient Instructions (Addendum)
Tiffany Montes , Thank you for taking time to come for your Medicare Wellness Visit. I appreciate your ongoing commitment to your health goals. Please review the following plan we discussed and let me know if I can assist you in the future.   Referrals/Orders/Follow-Ups/Clinician Recommendations:   This is a list of the screening recommended for you and due dates:  Health Maintenance  Topic Date Due   Zoster (Shingles) Vaccine (2 of 2) 12/28/2022   Flu Shot  05/08/2023   COVID-19 Vaccine (6 - 2023-24 season) 06/08/2023   Mammogram  08/03/2023   Medicare Annual Wellness Visit  06/22/2024   DTaP/Tdap/Td vaccine (3 - Td or Tdap) 04/18/2029   Colon Cancer Screening  05/12/2029   Pneumonia Vaccine  Completed   DEXA scan (bone density measurement)  Completed   Hepatitis C Screening  Completed   HPV Vaccine  Aged Out    Advanced directives: (Copy Requested) Please bring a copy of your health care power of attorney and living will to the office to be added to your chart at your convenience.  Next Medicare Annual Wellness Visit scheduled for next year: Yes

## 2023-06-25 NOTE — Progress Notes (Signed)
Vital Signs: Unable to obtain new vitals due to this being a telehealth visit.

## 2023-07-04 ENCOUNTER — Other Ambulatory Visit: Payer: Self-pay | Admitting: Family Medicine

## 2023-07-07 ENCOUNTER — Other Ambulatory Visit: Payer: Self-pay | Admitting: Family Medicine

## 2023-07-07 DIAGNOSIS — F3289 Other specified depressive episodes: Secondary | ICD-10-CM

## 2023-07-07 DIAGNOSIS — R059 Cough, unspecified: Secondary | ICD-10-CM

## 2023-07-15 ENCOUNTER — Ambulatory Visit (INDEPENDENT_AMBULATORY_CARE_PROVIDER_SITE_OTHER): Payer: Medicare Other | Admitting: Family Medicine

## 2023-07-15 VITALS — BP 120/86 | HR 81 | Temp 97.9°F | Ht 62.0 in | Wt 209.5 lb

## 2023-07-15 DIAGNOSIS — F909 Attention-deficit hyperactivity disorder, unspecified type: Secondary | ICD-10-CM

## 2023-07-15 DIAGNOSIS — E782 Mixed hyperlipidemia: Secondary | ICD-10-CM | POA: Diagnosis not present

## 2023-07-15 DIAGNOSIS — R7303 Prediabetes: Secondary | ICD-10-CM | POA: Diagnosis not present

## 2023-07-15 DIAGNOSIS — E039 Hypothyroidism, unspecified: Secondary | ICD-10-CM | POA: Diagnosis not present

## 2023-07-15 DIAGNOSIS — F3289 Other specified depressive episodes: Secondary | ICD-10-CM | POA: Diagnosis not present

## 2023-07-15 DIAGNOSIS — I10 Essential (primary) hypertension: Secondary | ICD-10-CM | POA: Diagnosis not present

## 2023-07-15 DIAGNOSIS — Z6838 Body mass index (BMI) 38.0-38.9, adult: Secondary | ICD-10-CM

## 2023-07-15 DIAGNOSIS — E66812 Obesity, class 2: Secondary | ICD-10-CM

## 2023-07-15 DIAGNOSIS — Z23 Encounter for immunization: Secondary | ICD-10-CM | POA: Diagnosis not present

## 2023-07-15 DIAGNOSIS — R5383 Other fatigue: Secondary | ICD-10-CM

## 2023-07-15 DIAGNOSIS — E7800 Pure hypercholesterolemia, unspecified: Secondary | ICD-10-CM

## 2023-07-15 LAB — BASIC METABOLIC PANEL
BUN: 24 mg/dL — ABNORMAL HIGH (ref 6–23)
CO2: 26 meq/L (ref 19–32)
Calcium: 9.6 mg/dL (ref 8.4–10.5)
Chloride: 104 meq/L (ref 96–112)
Creatinine, Ser: 0.91 mg/dL (ref 0.40–1.20)
GFR: 64.86 mL/min (ref >60.00)
Glucose, Bld: 112 mg/dL — ABNORMAL HIGH (ref 70–99)
Potassium: 3.8 meq/L (ref 3.5–5.1)
Sodium: 138 meq/L (ref 135–145)

## 2023-07-15 LAB — CBC WITH DIFFERENTIAL/PLATELET
Basophils Absolute: 0.1 10*3/uL (ref 0.0–0.1)
Basophils Relative: 0.9 % (ref 0.0–3.0)
Eosinophils Absolute: 0.2 10*3/uL (ref 0.0–0.7)
Eosinophils Relative: 2.8 % (ref 0.0–5.0)
HCT: 42.3 % (ref 36.0–46.0)
Hemoglobin: 13.7 g/dL (ref 12.0–15.0)
Lymphocytes Relative: 26.4 % (ref 12.0–46.0)
Lymphs Abs: 1.6 10*3/uL (ref 0.7–4.0)
MCHC: 32.4 g/dL (ref 30.0–36.0)
MCV: 88.7 fL (ref 78.0–100.0)
Monocytes Absolute: 0.4 10*3/uL (ref 0.1–1.0)
Monocytes Relative: 7 % (ref 3.0–12.0)
Neutro Abs: 3.7 10*3/uL (ref 1.4–7.7)
Neutrophils Relative %: 62.9 % (ref 43.0–77.0)
Platelets: 229 10*3/uL (ref 150.0–400.0)
RBC: 4.76 Mil/uL (ref 3.87–5.11)
RDW: 13.4 % (ref 11.5–15.5)
WBC: 5.9 10*3/uL (ref 4.0–10.5)

## 2023-07-15 LAB — HEPATIC FUNCTION PANEL
ALT: 13 U/L (ref 0–35)
AST: 18 U/L (ref 0–37)
Albumin: 4.2 g/dL (ref 3.5–5.2)
Alkaline Phosphatase: 47 U/L (ref 39–117)
Bilirubin, Direct: 0 mg/dL (ref 0.0–0.3)
Total Bilirubin: 0.3 mg/dL (ref 0.2–1.2)
Total Protein: 6.6 g/dL (ref 6.0–8.3)

## 2023-07-15 LAB — LIPID PANEL
Cholesterol: 241 mg/dL — ABNORMAL HIGH (ref 0–200)
HDL: 59 mg/dL (ref >39.00)
LDL Cholesterol: 159 mg/dL — ABNORMAL HIGH (ref 0–99)
NonHDL: 182.04
Total CHOL/HDL Ratio: 4
Triglycerides: 115 mg/dL (ref 0.0–149.0)
VLDL: 23 mg/dL (ref 0.0–40.0)

## 2023-07-15 LAB — HEMOGLOBIN A1C: Hgb A1c MFr Bld: 5.9 % (ref 4.6–6.5)

## 2023-07-15 NOTE — Progress Notes (Signed)
Established Patient Office Visit  Subjective   Patient ID: Tiffany Montes, female    DOB: 16-Nov-1955  Age: 67 y.o. MRN: [id number]  Chief Complaint  Patient presents with   Medical Management of Chronic Issues    Pt reports she is seeing counselor for depression. Brought up about medication that provider recommended for pt to take. Would like to discuss with Dr. Caryl Never   Fatigue    Pt reports she has been getting tired easily when doing chores.    Hearing Problem    HPI   Tiffany Montes was scheduled for a "complete physical ".  However, she has Medicare primary and just had recent Medicare wellness visit.  We shifted this to discuss multiple chronic issues as below  Past medical history reviewed:   Longstanding history of obesity, hypertension, hypothyroidism, degenerative arthritis, ADD, history of recurrent depression, history of breast cancer, metabolic syndrome, hyperlipidemia, prediabetes.  She has had very stressful year.  Her son who has history of heroin addiction had sepsis of the hip related to his addiction and is still living with her.  She remains on Wellbutrin and sertraline.  No suicidal ideation.  She has gotten some counseling recently.  She feels like losing some weight and getting her life back in order may help benefit her in terms of her depression.  She has had some recent hot flashes but takes tamoxifen and will finish that this year.  She also takes gabapentin.  This helps somewhat.  She has ADD currently treated with Adderall 30 mg.  She frequently takes half tablet per day which seems to help.  She has hypothyroidism treated with levothyroxine and compliant with therapy.  She had recent TSH to goal.  She has hypertension which is treated with losartan HCTZ.  She has hyperlipidemia we treated her previously with rosuvastatin but she had consistent leg cramps.  These went away as soon as she stopped rosuvastatin.  She has been somewhat reluctant to consider other  statins.  She is frustrated with her current weight.  She knows she has history of poor compliance.  She previously lost about 27 pounds at the weight management clinic and would like to consider referral back to them.  She feels like the accountability of that program helped her significantly.  Past Medical History:  Diagnosis Date   ADHD    Anxiety    Attention deficit disorder    Back pain    Breast cancer (HCC) 2019   Left Breast Cancer   Depression    Gallbladder problem    Gallstones 09/05/2011   Hyperlipidemia    Hypertension    Hypothyroidism    Joint pain    Migraines    Neuropathy    Personal history of chemotherapy 2019   Left Breast Cancer   Personal history of radiation therapy 2019   Left Breast Cancer   PONV (postoperative nausea and vomiting)    diffficulty waking up   Thyroid disease    hypothyroid   Past Surgical History:  Procedure Laterality Date   ABDOMINAL HYSTERECTOMY  2003   BREAST BIOPSY Left 2014   benign   BREAST LUMPECTOMY Left 09/07/2018   BREAST LUMPECTOMY WITH RADIOACTIVE SEED AND SENTINEL LYMPH NODE BIOPSY Left 09/07/2018   Procedure: LEFT BREAST LUMPECTOMY WITH RADIOACTIVE SEED AND AXILLARY SENTINEL LYMPH NODE BIOPSY,INJECT BLUE DYE LEFT BREAST;  Surgeon: Claud Kelp, MD;  Location: [facility] OR;  Service: General;  Laterality: Left;   CHOLECYSTECTOMY  09/26/2011   Procedure: LAPAROSCOPIC  CHOLECYSTECTOMY WITH INTRAOPERATIVE CHOLANGIOGRAM;  Surgeon: Currie Paris, MD;  Location[facility];  Service: General;  Laterality: N/A;   COLONOSCOPY     CYSTIC HYGROMA EXCISION     DENTAL SURGERY     skin graft from top of mouth to lower gums   PORT-A-CATH REMOVAL Right 11/04/2019   Procedure: REMOVAL PORT-A-CATH;  Surgeon: Manus Rudd, MD;  Location: [facility];  Service: General;  Laterality: Right;   PORTACATH PLACEMENT N/A 09/07/2018   Procedure: INSERTION PORT-A-CATH WITH [place];  Surgeon: Claud Kelp, MD;  Location: [facility] OR;   Service: General;  Laterality: N/A;   REPLACEMENT TOTAL KNEE Right     reports that she has never smoked. She has never used smokeless tobacco. She reports that she does not currently use alcohol. She reports that she does not use drugs. family history includes Alcoholism in her father; Diabetes in her father; Heart disease in her father; Hyperlipidemia in her father; Hypertension in her father and mother; Lung cancer in her father; Thyroid disease in her father and mother. Allergies  Allergen Reactions   Morphine Sulfate Nausea And Vomiting and Rash    GI upset     Review of Systems  Constitutional:  Positive for malaise/fatigue.  Respiratory:  Negative for shortness of breath.   Cardiovascular:  Negative for chest pain.  Gastrointestinal:  Negative for abdominal pain.  Genitourinary:  Negative for dysuria.  Neurological:  Negative for headaches.  Psychiatric/Behavioral:  Negative for suicidal ideas. The patient is nervous/anxious.       Objective:     BP 120/86 (BP Location: Left Arm, Patient Position: Sitting, Cuff Size: Large)   Pulse 81   Temp 97.9 F (36.6 C) (Oral)   Ht 5\' 2"  (1.575 m)   Wt 209 lb 8 oz (95 kg)   SpO2 97%   BMI 38.32 kg/m  BP Readings from Last 3 Encounters:  07/15/23 120/86  04/14/23 134/80  10/22/22 132/74   Wt Readings from Last 3 Encounters:  07/15/23 209 lb 8 oz (95 kg)  06/23/23 203 lb (92.1 kg)  04/14/23 203 lb 4.8 oz (92.2 kg)      Physical Exam Vitals reviewed.  Constitutional:      General: She is not in acute distress.    Appearance: She is well-developed. She is obese.  Eyes:     Pupils: Pupils are equal, round, and reactive to light.  Neck:     Thyroid: No thyromegaly.     Vascular: No JVD.  Cardiovascular:     Rate and Rhythm: Normal rate and regular rhythm.     Heart sounds:     No gallop.  Pulmonary:     Effort: Pulmonary effort is normal. No respiratory distress.     Breath sounds: Normal breath sounds. No wheezing  or rales.  Abdominal:     Palpations: Abdomen is soft.     Tenderness: There is no abdominal tenderness.  Musculoskeletal:     Cervical back: Neck supple.     Right lower leg: No edema.     Left lower leg: No edema.  Neurological:     Mental Status: She is alert.      No results found for any visits on 07/15/23.  Last CBC Lab Results  Component Value Date   WBC 6.3 06/24/2022   HGB 13.5 06/24/2022   HCT 41.0 06/24/2022   MCV 87.7 06/24/2022   MCH 28.9 04/03/2022   RDW 13.3 06/24/2022   PLT 186.0 06/24/2022  Last metabolic panel Lab Results  Component Value Date   GLUCOSE 115 (H) 06/24/2022   NA 140 06/24/2022   K 4.5 06/24/2022   CL 104 06/24/2022   CO2 27 06/24/2022   BUN 16 06/24/2022   CREATININE 0.89 06/24/2022   GFR 67.11 06/24/2022   CALCIUM 9.1 06/24/2022   PROT 6.8 06/24/2022   ALBUMIN 4.1 06/24/2022   BILITOT 0.3 06/24/2022   ALKPHOS 50 06/24/2022   AST 16 06/24/2022   ALT 11 06/24/2022   ANIONGAP 7 04/03/2022   Last lipids Lab Results  Component Value Date   CHOL 145 06/24/2022   HDL 59.70 06/24/2022   LDLCALC 71 06/24/2022   LDLDIRECT 136.7 09/15/2012   TRIG 71.0 06/24/2022   CHOLHDL 2 06/24/2022   Last hemoglobin A1c Lab Results  Component Value Date   HGBA1C 6.0 04/14/2023   Last thyroid functions Lab Results  Component Value Date   TSH 3.83 04/14/2023      The 10-year ASCVD risk score (Arnett DK, et al., 2019) is: 7.8%    Assessment & Plan:   Problem List Items Addressed This Visit       Unprioritized   Hypothyroidism   Depression - Primary   Mixed hyperlipidemia   Relevant Orders   Hepatic function panel   Lipid panel   Attention deficit disorder   Prediabetes   Relevant Orders   Hemoglobin A1c   Essential hypertension   Relevant Orders   Basic metabolic panel   Other Visit Diagnoses     Fatigue, unspecified type       Relevant Orders   CBC with Differential/Platelet     Patient has multiple chronic  problems as above which were addressed today.  We discussed the following items  -Recommend flu vaccine and she consents -Discussed importance of weight loss.  She has had previous success with bariatric weight loss program and would like to get scheduled to see them again and referral was placed -Discussed lipid issues.  She had reported leg cramps with rosuvastatin.  Will recheck fasting lipids today.  Consider trial of another statin such as low-dose Lipitor -She has hypothyroidism.  She had recent TSH just a few months ago which is at goal.  Recheck yearly. -Continue low glycemic diet.  Recheck A1c today.  No follow-ups on file.    [name], MD

## 2023-07-15 NOTE — Addendum Note (Signed)
Addended by: Vickii Chafe on: 07/15/2023 11:42 AM   Modules accepted: Orders

## 2023-07-21 ENCOUNTER — Telehealth: Payer: Self-pay | Admitting: Family Medicine

## 2023-07-21 MED ORDER — ATORVASTATIN CALCIUM 10 MG PO TABS: 10.0000 mg | ORAL_TABLET | Freq: Every day | ORAL | 0 refills | Status: DC

## 2023-07-21 NOTE — Addendum Note (Signed)
Addended by: Christy Sartorius on: 07/21/2023 03:57 PM   Modules accepted: Orders

## 2023-07-21 NOTE — Telephone Encounter (Signed)
Pt called, returning CMA's call. CMA was with a Pt.  Pt asked that CMA call back at his earliest convenience.

## 2023-07-21 NOTE — Telephone Encounter (Signed)
Please see result note

## 2023-07-22 ENCOUNTER — Other Ambulatory Visit: Payer: Self-pay | Admitting: Family Medicine

## 2023-07-22 DIAGNOSIS — R059 Cough, unspecified: Secondary | ICD-10-CM

## 2023-08-04 ENCOUNTER — Ambulatory Visit
Admission: RE | Admit: 2023-08-04 | Discharge: 2023-08-04 | Disposition: A | Discharge status: Home / Self Care | Payer: Medicare Other | Source: Ambulatory Visit | Attending: Hematology and Oncology

## 2023-08-04 DIAGNOSIS — Z170 Estrogen receptor positive status [ER+]: Secondary | ICD-10-CM

## 2023-08-07 ENCOUNTER — Other Ambulatory Visit: Payer: Self-pay | Admitting: Family Medicine

## 2023-08-07 DIAGNOSIS — R059 Cough, unspecified: Secondary | ICD-10-CM

## 2023-08-07 DIAGNOSIS — I10 Essential (primary) hypertension: Secondary | ICD-10-CM

## 2023-08-07 DIAGNOSIS — F3289 Other specified depressive episodes: Secondary | ICD-10-CM

## 2023-08-07 NOTE — Telephone Encounter (Signed)
Pt called to F/U on these refill requests, and stated she also needs refills of the following:   ALPRAZolam (XANAX) 0.5 MG tablet  amphetamine-dextroamphetamine (ADDERALL) 30 MG tablet  Please send to:  [id number] - [place], [place] - [address] AT [facility] OF [facility] [address] Phone: [phone]  Fax: [phone]

## 2023-08-08 NOTE — Addendum Note (Signed)
Addended by: Laneta Simmers L on: 08/08/2023 11:08 AM   Modules accepted: Orders

## 2023-08-10 MED ORDER — AMPHETAMINE-DEXTROAMPHETAMINE 30 MG PO TABS: 30.0000 mg | ORAL_TABLET | Freq: Every day | ORAL | 0 refills | Status: DC

## 2023-08-10 MED ORDER — ALPRAZOLAM 0.5 MG PO TABS: ORAL_TABLET | ORAL | 0 refills | Status: DC

## 2023-08-10 NOTE — Addendum Note (Signed)
Addended by: Kristian Covey on: 08/10/2023 01:52 PM   Modules accepted: Orders

## 2023-08-23 ENCOUNTER — Other Ambulatory Visit: Payer: Self-pay | Admitting: Family Medicine

## 2023-09-08 ENCOUNTER — Telehealth: Payer: Self-pay | Admitting: Family Medicine

## 2023-09-08 ENCOUNTER — Other Ambulatory Visit: Payer: Self-pay | Admitting: Family Medicine

## 2023-09-08 NOTE — Telephone Encounter (Signed)
Prescription Request  09/08/2023  LOV: 07/15/2023  What is the name of the medication or equipment? amphetamine-dextroamphetamine (ADDERALL) 30 MG tablet , benzonatate (TESSALON) 100 MG capsule , ALPRAZolam (XANAX) 0.5 MG tablet ,    Have you contacted your pharmacy to request a refill? No   Which pharmacy would you like this sent to?   [id number] - [place], [place] - [address] AT [address] [facility] [address] Phone: [phone]  Fax: [phone]      Patient notified that their request is being sent to the clinical staff for review and that they should receive a response within 2 business days.   Please advise at Mobile [phone] (mobile)

## 2023-09-09 ENCOUNTER — Other Ambulatory Visit: Payer: Self-pay | Admitting: Family Medicine

## 2023-09-09 DIAGNOSIS — R059 Cough, unspecified: Secondary | ICD-10-CM

## 2023-09-09 DIAGNOSIS — F3289 Other specified depressive episodes: Secondary | ICD-10-CM

## 2023-09-15 NOTE — Telephone Encounter (Signed)
Left a message for the patient to return my call.  

## 2023-09-15 NOTE — Telephone Encounter (Signed)
Patient informed of the message below and voiced understanding  

## 2023-10-07 ENCOUNTER — Other Ambulatory Visit: Payer: Self-pay | Admitting: Hematology and Oncology

## 2023-10-08 ENCOUNTER — Other Ambulatory Visit: Payer: Self-pay | Admitting: Family Medicine

## 2023-10-14 NOTE — Telephone Encounter (Signed)
 Spoke with Adela Lank, [facility]ist at [facility] and informed her of the approval as below per PCP.

## 2023-10-14 NOTE — Telephone Encounter (Signed)
 Message sent to PCP to review as approval is requested to change to another manufacturer as they can no longer get [employer] brand.

## 2023-10-23 ENCOUNTER — Inpatient Hospital Stay: Payer: Medicare Other | Admitting: Hematology and Oncology

## 2023-10-27 ENCOUNTER — Other Ambulatory Visit: Payer: Self-pay | Admitting: *Deleted

## 2023-10-27 DIAGNOSIS — Z170 Estrogen receptor positive status [ER+]: Secondary | ICD-10-CM

## 2023-10-28 ENCOUNTER — Inpatient Hospital Stay: Payer: Medicare Other

## 2023-10-28 ENCOUNTER — Inpatient Hospital Stay: Payer: Medicare Other | Attending: Hematology and Oncology | Admitting: Hematology and Oncology

## 2023-10-28 ENCOUNTER — Other Ambulatory Visit: Payer: Self-pay | Admitting: Family Medicine

## 2023-10-28 ENCOUNTER — Encounter: Payer: Self-pay | Admitting: Hematology and Oncology

## 2023-10-28 VITALS — BP 140/74 | HR 84 | Temp 98.1°F | Resp 17 | Wt 210.6 lb

## 2023-10-28 DIAGNOSIS — Z1721 Progesterone receptor positive status: Secondary | ICD-10-CM | POA: Diagnosis not present

## 2023-10-28 DIAGNOSIS — Z9221 Personal history of antineoplastic chemotherapy: Secondary | ICD-10-CM | POA: Insufficient documentation

## 2023-10-28 DIAGNOSIS — Z1731 Human epidermal growth factor receptor 2 positive status: Secondary | ICD-10-CM | POA: Insufficient documentation

## 2023-10-28 DIAGNOSIS — R635 Abnormal weight gain: Secondary | ICD-10-CM | POA: Diagnosis not present

## 2023-10-28 DIAGNOSIS — Z79810 Long term (current) use of selective estrogen receptor modulators (SERMs): Secondary | ICD-10-CM | POA: Diagnosis not present

## 2023-10-28 DIAGNOSIS — Z801 Family history of malignant neoplasm of trachea, bronchus and lung: Secondary | ICD-10-CM | POA: Insufficient documentation

## 2023-10-28 DIAGNOSIS — C50212 Malignant neoplasm of upper-inner quadrant of left female breast: Secondary | ICD-10-CM | POA: Diagnosis not present

## 2023-10-28 DIAGNOSIS — E785 Hyperlipidemia, unspecified: Secondary | ICD-10-CM | POA: Insufficient documentation

## 2023-10-28 DIAGNOSIS — F3289 Other specified depressive episodes: Secondary | ICD-10-CM

## 2023-10-28 DIAGNOSIS — Z170 Estrogen receptor positive status [ER+]: Secondary | ICD-10-CM | POA: Insufficient documentation

## 2023-10-28 DIAGNOSIS — C50312 Malignant neoplasm of lower-inner quadrant of left female breast: Secondary | ICD-10-CM | POA: Insufficient documentation

## 2023-10-28 DIAGNOSIS — Z90710 Acquired absence of both cervix and uterus: Secondary | ICD-10-CM | POA: Diagnosis not present

## 2023-10-28 DIAGNOSIS — Z923 Personal history of irradiation: Secondary | ICD-10-CM | POA: Insufficient documentation

## 2023-10-28 DIAGNOSIS — I10 Essential (primary) hypertension: Secondary | ICD-10-CM

## 2023-10-28 LAB — CMP (CANCER CENTER ONLY)
ALT: 13 U/L (ref 0–44)
AST: 18 U/L (ref 15–41)
Albumin: 4.1 g/dL (ref 3.5–5.0)
Alkaline Phosphatase: 45 U/L (ref 38–126)
Anion gap: 7 (ref 5–15)
BUN: 19 mg/dL (ref 8–23)
CO2: 29 mmol/L (ref 22–32)
Calcium: 9.2 mg/dL (ref 8.9–10.3)
Chloride: 103 mmol/L (ref 98–111)
Creatinine: 0.85 mg/dL (ref 0.44–1.00)
GFR, Estimated: >60 mL/min (ref >60)
Glucose, Bld: 115 mg/dL — ABNORMAL HIGH (ref 70–99)
Potassium: 3.8 mmol/L (ref 3.5–5.1)
Sodium: 139 mmol/L (ref 135–145)
Total Bilirubin: 0.4 mg/dL (ref 0.0–1.2)
Total Protein: 6.7 g/dL (ref 6.5–8.1)

## 2023-10-28 LAB — CBC WITH DIFFERENTIAL (CANCER CENTER ONLY)
Abs Immature Granulocytes: 0.02 10*3/uL (ref 0.00–0.07)
Basophils Absolute: 0.1 10*3/uL (ref 0.0–0.1)
Basophils Relative: 1 %
Eosinophils Absolute: 0.2 10*3/uL (ref 0.0–0.5)
Eosinophils Relative: 4 %
HCT: 40.8 % (ref 36.0–46.0)
Hemoglobin: 13.5 g/dL (ref 12.0–15.0)
Immature Granulocytes: 0 %
Lymphocytes Relative: 27 %
Lymphs Abs: 1.6 10*3/uL (ref 0.7–4.0)
MCH: 28.8 pg (ref 26.0–34.0)
MCHC: 33.1 g/dL (ref 30.0–36.0)
MCV: 87.2 fL (ref 80.0–100.0)
Monocytes Absolute: 0.5 10*3/uL (ref 0.1–1.0)
Monocytes Relative: 8 %
Neutro Abs: 3.6 10*3/uL (ref 1.7–7.7)
Neutrophils Relative %: 60 %
Platelet Count: 192 10*3/uL (ref 150–400)
RBC: 4.68 MIL/uL (ref 3.87–5.11)
RDW: 12.9 % (ref 11.5–15.5)
WBC Count: 6.1 10*3/uL (ref 4.0–10.5)
nRBC: 0 % (ref 0.0–0.2)

## 2023-10-28 NOTE — Addendum Note (Signed)
Addended by: Jackquline Denmark on: 10/28/2023 01:05 PM   Modules accepted: Orders

## 2023-10-28 NOTE — Progress Notes (Signed)
[facility]  [phone] [phone]    ID: [name] DOB: 1956/04/27  MR#: [id number]  ZHY#:[id number]  Patient Care Team: Kristian Covey, MD as PCP - General Magrinat, Valentino Hue, MD (Inactive) as Consulting Physician (Oncology) Dorothy Puffer, MD as Consulting Physician (Radiation Oncology) Freddy Finner, MD (Inactive) as Consulting Physician (Obstetrics and Gynecology) Laurey Morale, MD as Consulting Physician (Cardiology) OTHER MD:    CHIEF COMPLAINT: Estrogen receptor positive breast cancer, HER-2 amplified  CURRENT TREATMENT: tamoxifen  INTERVAL HISTORY:  Discussed the use of AI scribe software for clinical note transcription with the patient, who gave verbal consent to proceed.  History of Present Illness          The patient, with a history of breast cancer and hyperlipidemia, presents with concerns about weight gain and high cholesterol. She is currently on tamoxifen since Feb 2022. She reports gaining significant weight, now weighing 210 pounds, which is the heaviest she has ever been. This weight gain has been distressing for her, leading to feelings of depression and a decrease in her usual activity level. She expresses interest in exploring other cholesterol-lowering medications, including injectable options like Repatha, as suggested by a friend.  The patient is also nearing the end of her course of tamoxifen for breast cancer, which she believes may have contributed to her weight gain and slowed metabolism. She expresses concern about potential interactions between her cancer medication and any new weight loss drugs.   COVID 19 VACCINATION STATUS: Status post Pfizer x2 with booster August 28, 2020   HISTORY OF CURRENT ILLNESS: From the original intake note:  "Tiffany Montes" had routine screening mammography on 07/22/2018 showing a possible abnormality in the left breast. She underwent unilateral diagnostic mammography with  tomography and left breast ultrasonography at [facility] on 07/28/2018 showing: Breast density Category C; a 7 x 8 x 8 mm hypoechoic mass in the left breast lower inner quadrant, consistent with a complex cyst.. No abnormal lymph nodes in the left axilla.  Accordingly on 07/31/2018 she proceeded to biopsy of the left breast area in question. The pathology from this procedure showed ([id number]): Invasive Ductal Carcinoma, Grade 3. Prognostic indicators significant for: estrogen receptor, 95% positive and progesterone receptor, 85% positive, both with strong staining intensity. Proliferation marker Ki67 at 75%. HER2 positive by immunohistochemistry, 3+.   The patient's subsequent history is as detailed below.   PAST MEDICAL HISTORY: Past Medical History:  Diagnosis Date   ADHD    Anxiety    Attention deficit disorder    Back pain    Breast cancer (HCC) 2019   Left Breast Cancer   Depression    Gallbladder problem    Gallstones 09/05/2011   Hyperlipidemia    Hypertension    Hypothyroidism    Joint pain    Migraines    Neuropathy    Personal history of chemotherapy 2019   Left Breast Cancer   Personal history of radiation therapy 2019   Left Breast Cancer   PONV (postoperative nausea and vomiting)    diffficulty waking up   Thyroid disease    hypothyroid    PAST SURGICAL HISTORY: Past Surgical History:  Procedure Laterality Date   ABDOMINAL HYSTERECTOMY  2003   BREAST BIOPSY Left 2014   benign   BREAST LUMPECTOMY Left 09/07/2018   BREAST LUMPECTOMY WITH RADIOACTIVE SEED AND SENTINEL LYMPH NODE BIOPSY Left 09/07/2018   Procedure: LEFT BREAST LUMPECTOMY WITH RADIOACTIVE SEED AND AXILLARY SENTINEL LYMPH  NODE BIOPSY,INJECT BLUE DYE LEFT BREAST;  Surgeon: Claud Kelp, MD;  Location: [facility] OR;  Service: General;  Laterality: Left;   CHOLECYSTECTOMY  09/26/2011   Procedure: LAPAROSCOPIC CHOLECYSTECTOMY WITH INTRAOPERATIVE CHOLANGIOGRAM;  Surgeon: Currie Paris, MD;   Location: [facility] OR;  Service: General;  Laterality: N/A;   COLONOSCOPY     CYSTIC HYGROMA EXCISION     DENTAL SURGERY     skin graft from top of mouth to lower gums   PORT-A-CATH REMOVAL Right 11/04/2019   Procedure: REMOVAL PORT-A-CATH;  Surgeon: Manus Rudd, MD;  Location: [facility];  Service: General;  Laterality: Right;   PORTACATH PLACEMENT N/A 09/07/2018   Procedure: INSERTION PORT-A-CATH WITH [place];  Surgeon: Claud Kelp, MD;  Location: [facility] OR;  Service: General;  Laterality: N/A;   REPLACEMENT TOTAL KNEE Right     FAMILY HISTORY: Family History  Problem Relation Age of Onset   Diabetes Father    Heart disease Father    Lung cancer Father    Hypertension Father    Hyperlipidemia Father    Thyroid disease Father    Alcoholism Father    Hypertension Mother    Thyroid disease Mother    Breast cancer Neg Hx    Ovarian cancer Neg Hx   She notes that her father died from CHF at age 49.  He was diagnosed with lung cancer at age 60. Patients' mother is 65 years old as of November 2019. The patient has 1 brother and 2 sisters. Patient denies a family history of ovarian or breast cancer.   GYNECOLOGIC HISTORY:  No LMP recorded. Patient has had a hysterectomy. Menarche: 68 years old Age at first live birth: 68 years old GX P2 LMP: at age 55 Contraceptive: Yes HRT: Yes, continued until diagnosis of breast cancer October 2019 Hysterectomy?: Yes BSO?: Yes   SOCIAL HISTORY: (As of February 2022) She worked at [employer] as a Investment banker, corporate. She has been unable to work there more recently for a variety of medical issues.  She is divorced and lives by herself with her 2 dogs. Her daughter Acie Fredrickson is a Curator in [place], [place].  The patient's son, Melton Krebs is a Financial risk analyst and lives in [place]. The patient has 2 grandchildren and 1 great-grand child.  She is not a church attender   ADVANCED DIRECTIVES: Her Healthcare Power of Gerrit Friends is her  daughter, Shanda Bumps, [phone].     HEALTH MAINTENANCE: Social History   Tobacco Use   Smoking status: Never   Smokeless tobacco: Never  Vaping Use   Vaping status: Never Used  Substance Use Topics   Alcohol use: Not Currently    Comment: 1-2 a week and reports not every week   Drug use: No    Colonoscopy: 05/2019, Dr. Kinnie Scales, 1 adenoma  PAP: 1 month ago  Bone density: Yes, 2 years ago   Allergies  Allergen Reactions   Morphine Sulfate Nausea And Vomiting and Rash    GI upset    Current Outpatient Medications  Medication Sig Dispense Refill   albuterol (VENTOLIN HFA) 108 (90 Base) MCG/ACT inhaler Inhale 2 puffs into the lungs every 6 (six) hours as needed for wheezing or shortness of breath. 8 g 0   ALPRAZolam (XANAX) 0.5 MG tablet TAKE 1 TABLET BY MOUTH AT BEDTIME AS NEEDED FOR ANXIETY 15 tablet 0   amphetamine-dextroamphetamine (ADDERALL) 30 MG tablet Take 1 tablet by mouth daily. 30 tablet 0   amphetamine-dextroamphetamine (ADDERALL) 30 MG tablet  Take 1 tablet by mouth daily. 30 tablet 0   amphetamine-dextroamphetamine (ADDERALL) 30 MG tablet Take 1 tablet by mouth daily. 30 tablet 0   atorvastatin (LIPITOR) 10 MG tablet Take 1 tablet (10 mg total) by mouth daily. 90 tablet 0   benzonatate (TESSALON) 100 MG capsule TAKE 1 CAPSULE BY MOUTH 2 TIMES DAILY AS NEEDED FOR COUGH 40 capsule 0   buPROPion (WELLBUTRIN XL) 300 MG 24 hr tablet TAKE ONE TABLET BY MOUTH ONCE A DAY 90 tablet 0   fluticasone (FLONASE) 50 MCG/ACT nasal spray SPRAY 2 SPRAYS INTO EACH NOSTRIL DAILY AS NEEDED FOR ALLERGIES 48 mL 0   gabapentin (NEURONTIN) 300 MG capsule TAKE 1 CAPSULE BY MOUTH EVERYDAY AT BEDTIME 90 capsule 3   levothyroxine (SYNTHROID) 150 MCG tablet TAKE 1 TABLET BY MOUTH EVERY DAY BEFORE BREAKFAST     losartan-hydrochlorothiazide (HYZAAR) 100-12.5 MG tablet TAKE 1 TABLET BY MOUTH EVERY DAY 90 tablet 0   neomycin-polymyxin b-dexamethasone (MAXITROL) 3.5-10000-0.1 OINT      sertraline  (ZOLOFT) 100 MG tablet TAKE 1 TABLET BY MOUTH EVERY DAY 90 tablet 0   SUMAtriptan (IMITREX) 100 MG tablet TAKE 1 TABLET BY MOUTH AS NEEDED FOR MIGRAINE *MAY REPEAT IN 24 HOURS AS DIRECTED 12 tablet 1   tamoxifen (NOLVADEX) 20 MG tablet TAKE 1 TABLET BY MOUTH EVERY DAY 90 tablet 3   trimethoprim-polymyxin b (POLYTRIM) ophthalmic solution Place 2 drops into the right eye every 4 (four) hours. 10 mL 0   Vitamin D-Vitamin K (VITAMIN K2-VITAMIN D3 PO) Take 5,000 Units by mouth.     No current facility-administered medications for this visit.    OBJECTIVE: White woman who appears stated age  There were no vitals filed for this visit.    There is no height or weight on file to calculate BMI.   Wt Readings from Last 3 Encounters:  07/15/23 209 lb 8 oz (95 kg)  06/23/23 203 lb (92.1 kg)  04/14/23 203 lb 4.8 oz (92.2 kg)  ECOG FS:1  Physical Exam Constitutional:      Appearance: Normal appearance.  Chest:     Comments: Bilateral breasts inspected.  No palpable masses or regional adenopathy Musculoskeletal:        General: No swelling or tenderness. Normal range of motion.     Cervical back: Normal range of motion and neck supple. No rigidity.  Lymphadenopathy:     Cervical: No cervical adenopathy.  Neurological:     Mental Status: She is alert.    LAB RESULTS:  CMP     Component Value Date/Time   NA 138 07/15/2023 1122   K 3.8 07/15/2023 1122   CL 104 07/15/2023 1122   CO2 26 07/15/2023 1122   GLUCOSE 112 (H) 07/15/2023 1122   BUN 24 (H) 07/15/2023 1122   CREATININE 0.91 07/15/2023 1122   CREATININE 0.78 04/03/2022 1346   CALCIUM 9.6 07/15/2023 1122   PROT 6.6 07/15/2023 1122   ALBUMIN 4.2 07/15/2023 1122   AST 18 07/15/2023 1122   AST 15 04/03/2022 1346   ALT 13 07/15/2023 1122   ALT 11 04/03/2022 1346   ALKPHOS 47 07/15/2023 1122   BILITOT 0.3 07/15/2023 1122   BILITOT 0.3 04/03/2022 1346   GFRNONAA >60 04/03/2022 1346   GFRAA >60 04/26/2020 1323   GFRAA >60  01/22/2019 0915    Lab Results  Component Value Date   WBC 5.9 07/15/2023   NEUTROABS 3.7 07/15/2023   HGB 13.7 07/15/2023   HCT 42.3 07/15/2023  MCV 88.7 07/15/2023   PLT 229.0 07/15/2023   No results found for: "LABCA2"  No components found for: "MVHQIO962"  No results for input(s): "INR" in the last 168 hours.  No results found for: "LABCA2"  No results found for: "XBM841"  No results found for: "CAN125"  No results found for: "CAN153"  No results found for: "CA2729"  No components found for: "HGQUANT"  No results found for: "CEA1", "CEA" / No results found for: "CEA1", "CEA"   No results found for: "AFPTUMOR"  No results found for: "CHROMOGRNA"  No results found for: "TOTALPROTELP", "ALBUMINELP", "A1GS", "A2GS", "BETS", "BETA2SER", "GAMS", "MSPIKE", "SPEI" (this displays SPEP labs)  No results found for: "KPAFRELGTCHN", "LAMBDASER", "KAPLAMBRATIO" (kappa/lambda light chains)  No results found for: "HGBA", "HGBA2QUANT", "HGBFQUANT", "HGBSQUAN" (Hemoglobinopathy evaluation)   No results found for: "LDH"  No results found for: "IRON", "TIBC", "IRONPCTSAT" (Iron and TIBC)  Lab Results  Component Value Date   FERRITIN 48 11/23/2020    Urinalysis    Component Value Date/Time   COLORURINE YELLOW 09/13/2011 0952   APPEARANCEUR CLEAR 09/13/2011 0952   LABSPEC 1.010 09/13/2011 0952   PHURINE 6.5 09/13/2011 0952   GLUCOSEU NEGATIVE 09/13/2011 0952   HGBUR NEGATIVE 09/13/2011 0952   HGBUR negative 04/03/2009 0949   BILIRUBINUR n 09/15/2012 0832   KETONESUR NEGATIVE 09/13/2011 0952   PROTEINUR n 09/15/2012 0832   PROTEINUR NEGATIVE 09/13/2011 0952   UROBILINOGEN 0.2 09/15/2012 0832   UROBILINOGEN 0.2 09/13/2011 0952   NITRITE n 09/15/2012 0832   NITRITE NEGATIVE 09/13/2011 0952   LEUKOCYTESUR Negative 09/15/2012 0832    STUDIES:  No results found.   ELIGIBLE FOR AVAILABLE RESEARCH PROTOCOL: no   ASSESSMENT: 68 y.o. [place] woman status  post left breast lower inner quadrant biopsy 07/31/2018 for a clinical T1b N0, stage IA invasive ductal carcinoma, grade 3, triple positive, with an MIB-1 of 75%.  (1) status post left lumpectomy and sentinel lymph node sampling 09/07/2018 for a pT1c pN0, stage IA invasive ductal carcinoma, grade 3, with negative margins.  (a) a total of 5 sentinel lymph nodes were removed  (2) adjuvant chemotherapy consisting of paclitaxel weekly x12 with trastuzumab every 21 days starting 10/02/2018, completed 12/25/2018  (3) trastuzumab continued to complete 1 year (through December 2020).  (a) echocardiogram 08/17/2018 shows an ejection fraction of 60-65%  (b) echocardiogram on 11/17/2018 shows an ejection fraction of 60-65%  (c) echo 02/08/2019 shows an ejection fraction in the 60-65%  (d) Echo in 05/2019 shows EF of 60-65%  (e) Echo on 09/16/2019 shows EF of 60-65%  (4) adjuvant radiation 02/04/2019 - 03/04/2019  (a) Left breast treated to 42.56 Gy in 16 fractions  (b) Seroma boost of 8 Gy in 4 fractions, for a total of 50.56 Gy  (5) started anastrozole 04/11/2019, discontinued February 2022 with possible neuropathy  (a) bone density scan 11/05/2019 showed a T score of -0.3, which is normal  (b) tamoxifen started February 2022   PLAN:  Hyperlipidemia - Patient experiencing side effects from Lipitor 10mg . -Consult with primary care physician regarding potential change in lipid-lowering therapy.  Breast Cancer Patient on Tamoxifen with concerns about potential side effects including weight gain. Discussed potential metabolic effects of Tamoxifen and the importance of dietary modifications. -Continue Tamoxifen until end of June 2025.  Weight Gain Patient reports significant weight gain and associated distress. Discussed potential strategies including intermittent fasting and potential weight loss medications. -Referral to weight loss clinic for further management.  Breast Health Normal  mammogram results  with calcifications in right breast improving. No palpable abnormalities on breast exam. -Return in one year for follow-up. -Consider diagnostic mammogram pending insurance coverage. Patient to discuss with insurance provider.  Total time spent: 30 minutes  *Total Encounter Time as defined by the Centers for Medicare and Medicaid Services includes, in addition to the face-to-face time of a patient visit (documented in the note above) non-face-to-face time: obtaining and reviewing outside history, ordering and reviewing medications, tests or procedures, care coordination (communications with other health care professionals or caregivers) and documentation in the medical record.

## 2023-11-18 ENCOUNTER — Other Ambulatory Visit: Payer: Self-pay | Admitting: Family Medicine

## 2023-11-18 DIAGNOSIS — R059 Cough, unspecified: Secondary | ICD-10-CM

## 2023-11-28 ENCOUNTER — Other Ambulatory Visit: Payer: Self-pay | Admitting: Family Medicine

## 2023-11-28 DIAGNOSIS — F3289 Other specified depressive episodes: Secondary | ICD-10-CM

## 2023-11-28 NOTE — Telephone Encounter (Signed)
Copied from CRM [id number]. Topic: Clinical - Medication Refill >> Nov 28, 2023  4:12 PM Irine Seal wrote: Most Recent Primary Care Visit:  Provider: Kristian Covey  Department: [facility]  Visit Type: PHYSICAL  Date: 07/15/2023  Medication: ALPRAZolam (XANAX) 0.5 MG tablet   amphetamine-dextroamphetamine (ADDERALL) 30 MG tablet  Has the patient contacted their pharmacy? No Stated she does not want to call the pharmacy because they never fill it on time when she does   Is this the correct pharmacy for this prescription? Yes If no, delete pharmacy and type the correct one.  This is the patient's preferred pharmacy:  [id number] - [place], [place] - [address] AT [address] OF [facility] [address] [place] [address] Phone: [phone] Fax: [phone]    Has the prescription been filled recently? No  Is the patient out of the medication? Yes  Has the patient been seen for an appointment in the last year OR does the patient have an upcoming appointment? Yes  Can we respond through MyChart? Yes  Agent: Please be advised that Rx refills may take up to 3 business days. We ask that you follow-up with your pharmacy.

## 2023-12-01 MED ORDER — AMPHETAMINE-DEXTROAMPHETAMINE 30 MG PO TABS: 30.0000 mg | ORAL_TABLET | Freq: Every day | ORAL | 0 refills | Status: DC

## 2023-12-01 MED ORDER — ALPRAZOLAM 0.5 MG PO TABS: ORAL_TABLET | ORAL | 5 refills | Status: DC

## 2023-12-02 ENCOUNTER — Encounter (INDEPENDENT_AMBULATORY_CARE_PROVIDER_SITE_OTHER): Payer: Self-pay

## 2023-12-05 ENCOUNTER — Ambulatory Visit (INDEPENDENT_AMBULATORY_CARE_PROVIDER_SITE_OTHER): Payer: Medicare Other | Admitting: Family Medicine

## 2023-12-05 ENCOUNTER — Encounter: Payer: Self-pay | Admitting: Family Medicine

## 2023-12-05 VITALS — BP 142/90 | HR 90 | Temp 97.8°F | Wt 211.9 lb

## 2023-12-05 DIAGNOSIS — M48062 Spinal stenosis, lumbar region with neurogenic claudication: Secondary | ICD-10-CM | POA: Diagnosis not present

## 2023-12-05 DIAGNOSIS — M7061 Trochanteric bursitis, right hip: Secondary | ICD-10-CM

## 2023-12-05 DIAGNOSIS — I10 Essential (primary) hypertension: Secondary | ICD-10-CM

## 2023-12-05 MED ORDER — PREDNISONE 10 MG PO TABS: ORAL_TABLET | ORAL | 0 refills | Status: DC
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
## 2023-12-09 DIAGNOSIS — Z0289 Encounter for other administrative examinations: Secondary | ICD-10-CM

## 2023-12-10 ENCOUNTER — Encounter (INDEPENDENT_AMBULATORY_CARE_PROVIDER_SITE_OTHER): Payer: Self-pay

## 2023-12-19 ENCOUNTER — Encounter: Payer: Self-pay | Admitting: Family Medicine

## 2023-12-19 ENCOUNTER — Ambulatory Visit (INDEPENDENT_AMBULATORY_CARE_PROVIDER_SITE_OTHER): Admitting: Family Medicine

## 2023-12-19 VITALS — BP 132/80 | HR 101 | Temp 97.3°F | Wt 207.5 lb

## 2023-12-19 DIAGNOSIS — M5440 Lumbago with sciatica, unspecified side: Secondary | ICD-10-CM

## 2023-12-19 DIAGNOSIS — I10 Essential (primary) hypertension: Secondary | ICD-10-CM | POA: Diagnosis not present

## 2023-12-19 DIAGNOSIS — M7062 Trochanteric bursitis, left hip: Secondary | ICD-10-CM | POA: Diagnosis not present

## 2023-12-19 MED ORDER — METHYLPREDNISOLONE ACETATE 40 MG/ML IJ SUSP
40.0000 mg | Freq: Once | INTRAMUSCULAR | Status: AC
  Administered 2023-12-19: 40 mg via INTRA_ARTICULAR

## 2023-12-19 NOTE — Progress Notes (Signed)
 Established Patient Office Visit  Subjective   Patient ID: Tiffany Montes, female    DOB: [birth year]  Age: 68 y.o. MRN: [id number]  Chief Complaint  Patient presents with   Leg Pain   Back Pain    HPI   Tiffany Montes is here to discuss low back pain from last visit and bilateral hip pain.  She has history of known bursitis in her hips and recently has had more difficulty with her left hip.  She has soreness to touch laterally.  Has not gotten relief from icing.  Has benefited from steroid injections in the past.  She has suspected lumbar stenosis.  Pain worse with back extension.  She has had some recent radiation to both buttocks and intermittent right-sided sciatica symptoms.  She has increased walking since last visit and has been walking 3 times per day and trying to build up her duration.  She has some right foot numbness intermittently.  No urine or stool incontinence.  Inquires regarding whether physical therapy may be beneficial.  She has obesity and we discussed referral to local weight loss clinic and she has been scheduled to be seen there April 1.  She is already lost a few pounds on her own by scaling back portions and increased walking.  Blood pressure was up slightly last visit but down today.  She does take losartan HCTZ consistently.  Past Medical History:  Diagnosis Date   ADHD    Anxiety    Attention deficit disorder    Back pain    Breast cancer (HCC) 2019   Left Breast Cancer   Depression    Gallbladder problem    Gallstones 09/05/2011   Hyperlipidemia    Hypertension    Hypothyroidism    Joint pain    Migraines    Neuropathy    Personal history of chemotherapy 2019   Left Breast Cancer   Personal history of radiation therapy 2019   Left Breast Cancer   PONV (postoperative nausea and vomiting)    diffficulty waking up   Thyroid disease    hypothyroid   Past Surgical History:  Procedure Laterality Date   ABDOMINAL HYSTERECTOMY  2003   BREAST  BIOPSY Left 2014   benign   BREAST LUMPECTOMY Left 09/07/2018   BREAST LUMPECTOMY WITH RADIOACTIVE SEED AND SENTINEL LYMPH NODE BIOPSY Left 09/07/2018   Procedure: LEFT BREAST LUMPECTOMY WITH RADIOACTIVE SEED AND AXILLARY SENTINEL LYMPH NODE BIOPSY,INJECT BLUE DYE LEFT BREAST;  Surgeon: Claud Kelp, MD;  Location: [facility] OR;  Service: General;  Laterality: Left;   CHOLECYSTECTOMY  09/26/2011   Procedure: LAPAROSCOPIC CHOLECYSTECTOMY WITH INTRAOPERATIVE CHOLANGIOGRAM;  Surgeon: Currie Paris, MD;  Location: [facility] OR;  Service: General;  Laterality: N/A;   COLONOSCOPY     CYSTIC HYGROMA EXCISION     DENTAL SURGERY     skin graft from top of mouth to lower gums   PORT-A-CATH REMOVAL Right 11/04/2019   Procedure: REMOVAL PORT-A-CATH;  Surgeon: Manus Rudd, MD;  Location: [facility];  Service: General;  Laterality: Right;   PORTACATH PLACEMENT N/A 09/07/2018   Procedure: INSERTION PORT-A-CATH WITH [place];  Surgeon: Claud Kelp, MD;  Location: [facility] OR;  Service: General;  Laterality: N/A;   REPLACEMENT TOTAL KNEE Right     reports that she has never smoked. She has never used smokeless tobacco. She reports that she does not currently use alcohol. She reports that she does not use drugs. family history includes Alcoholism in her father; Diabetes in  her father; Heart disease in her father; Hyperlipidemia in her father; Hypertension in her father and mother; Lung cancer in her father; Thyroid disease in her father and mother. Allergies  Allergen Reactions   Morphine Sulfate Nausea And Vomiting and Rash    GI upset    Review of Systems  Constitutional:  Negative for chills, fever and malaise/fatigue.  Eyes:  Negative for blurred vision.  Respiratory:  Negative for shortness of breath.   Cardiovascular:  Negative for chest pain.  Musculoskeletal:  Positive for back pain.  Neurological:  Negative for dizziness, weakness and headaches.      Objective:     BP 132/80 (BP  Location: Right Arm, Patient Position: Sitting, Cuff Size: Normal)   Pulse (!) 101   Temp (!) 97.3 F (36.3 C) (Oral)   Wt 207 lb 8 oz (94.1 kg)   SpO2 95%   BMI 37.95 kg/m  BP Readings from Last 3 Encounters:  12/19/23 132/80  12/05/23 (!) 142/90  10/28/23 (!) 140/74   Wt Readings from Last 3 Encounters:  12/19/23 207 lb 8 oz (94.1 kg)  12/05/23 211 lb 14.4 oz (96.1 kg)  10/28/23 210 lb 9.6 oz (95.5 kg)      Physical Exam Vitals reviewed.  Constitutional:      Appearance: She is well-developed.  Eyes:     Pupils: Pupils are equal, round, and reactive to light.  Neck:     Thyroid: No thyromegaly.     Vascular: No JVD.  Cardiovascular:     Rate and Rhythm: Normal rate and regular rhythm.     Heart sounds:     No gallop.  Pulmonary:     Effort: Pulmonary effort is normal. No respiratory distress.     Breath sounds: Normal breath sounds. No wheezing or rales.  Musculoskeletal:     Cervical back: Neck supple.     Comments: Good range of motion left hip.  She does have some tenderness laterally over the greater trochanteric bursa.  Straight leg raises remain negative bilaterally.  Neurological:     Mental Status: She is alert.      No results found for any visits on 12/19/23.    The 10-year ASCVD risk score (Arnett DK, et al., 2019) is: 11.4%    Assessment & Plan:   #1 bilateral lumbar pain.  Intermittent sciatica type symptoms.  Patient recently placed on prednisone and did see some benefit.  She would like to consider trial of physical therapy.  She has been able to tolerate walking.  Suspected lumbar stenosis.  Physical therapy referral will be placed  #2 left lateral hip pain.  Very tender over her greater trochanteric bursa.  We discussed risk of steroid injection including risk of bleeding, bruising, low risk of infection.  Patient consented.  We prepped skin with Betadine and alcohol swab and using 25-gauge 1 and half inch needle injected 40 mg of Depo-Medrol  and 2 cc of plain Xylocaine.  Patient tolerated well.  Be in touch if pain persist  #3 hypertension.  Slightly elevated last visit and improved today.  Continue weight loss efforts.  Continue losartan HCTZ.  Blood pressure should improve with continued weight loss.   No follow-ups on file.    [name], MD

## 2023-12-19 NOTE — Addendum Note (Signed)
 Addended by: Christy Sartorius on: 12/19/2023 02:24 PM   Modules accepted: Orders

## 2023-12-22 ENCOUNTER — Other Ambulatory Visit: Payer: Self-pay | Admitting: Family Medicine

## 2023-12-24 NOTE — Therapy (Signed)
 OUTPATIENT PHYSICAL THERAPY THORACOLUMBAR EVALUATION   Patient Name: Tiffany Montes MRN: [id number] DOB:August 24, 1956, 68 y.o., female Today's Date: 12/25/2023  END OF SESSION:  PT End of Session - 12/25/23 1255     Visit Number 1    Number of Visits 17    Date for PT Re-Evaluation 02/19/24    Authorization Type MCR    PT Start Time 0800    PT Stop Time 0842    PT Time Calculation (min) 42 min    Activity Tolerance Patient tolerated treatment well    Behavior During Therapy [facility] for tasks assessed/performed             Past Medical History:  Diagnosis Date   ADHD    Anxiety    Attention deficit disorder    Back pain    Breast cancer (HCC) 2019   Left Breast Cancer   Depression    Gallbladder problem    Gallstones 09/05/2011   Hyperlipidemia    Hypertension    Hypothyroidism    Joint pain    Migraines    Neuropathy    Personal history of chemotherapy 2019   Left Breast Cancer   Personal history of radiation therapy 2019   Left Breast Cancer   PONV (postoperative nausea and vomiting)    diffficulty waking up   Thyroid disease    hypothyroid   Past Surgical History:  Procedure Laterality Date   ABDOMINAL HYSTERECTOMY  2003   BREAST BIOPSY Left 2014   benign   BREAST LUMPECTOMY Left 09/07/2018   BREAST LUMPECTOMY WITH RADIOACTIVE SEED AND SENTINEL LYMPH NODE BIOPSY Left 09/07/2018   Procedure: LEFT BREAST LUMPECTOMY WITH RADIOACTIVE SEED AND AXILLARY SENTINEL LYMPH NODE BIOPSY,INJECT BLUE DYE LEFT BREAST;  Surgeon: Claud Kelp, MD;  Location: [facility] OR;  Service: General;  Laterality: Left;   CHOLECYSTECTOMY  09/26/2011   Procedure: LAPAROSCOPIC CHOLECYSTECTOMY WITH INTRAOPERATIVE CHOLANGIOGRAM;  Surgeon: Currie Paris, MD;  Location: [facility] OR;  Service: General;  Laterality: N/A;   COLONOSCOPY     CYSTIC HYGROMA EXCISION     DENTAL SURGERY     skin graft from top of mouth to lower gums   PORT-A-CATH REMOVAL Right 11/04/2019   Procedure: REMOVAL  PORT-A-CATH;  Surgeon: Manus Rudd, MD;  Location: [facility];  Service: General;  Laterality: Right;   PORTACATH PLACEMENT N/A 09/07/2018   Procedure: INSERTION PORT-A-CATH WITH [place];  Surgeon: Claud Kelp, MD;  Location: [facility] OR;  Service: General;  Laterality: N/A;   REPLACEMENT TOTAL KNEE Right    Patient Active Problem List   Diagnosis Date Noted   Depression 01/02/2021   Metabolic syndrome 01/02/2021   Pes anserine bursitis 01/03/2020   Degenerative joint disease of knee, right 01/03/2020   Morbid obesity with BMI of 40.0-44.9, adult (HCC) 02/11/2019   Port-A-Cath in place 10/30/2018   Malignant neoplasm of upper-inner quadrant of left breast in female, estrogen receptor positive (HCC) 08/11/2018   Prediabetes 02/24/2018   Varicose veins of legs 06/03/2017   Mixed hyperlipidemia 01/08/2010   Attention deficit disorder 04/14/2009   Hypothyroidism 04/03/2009   Major depressive disorder, recurrent episode (HCC) 04/03/2009   Essential hypertension 04/03/2009    PCP: Kristian Covey, MD   REFERRING PROVIDER: Kristian Covey, MD   REFERRING DIAG: M54.40 (ICD-10-CM) - Bilateral low back pain with sciatica, sciatica laterality unspecified, unspecified chronicity   Rationale for Evaluation and Treatment: Rehabilitation  THERAPY DIAG:  Other low back pain  Muscle weakness (generalized)  Other  abnormalities of gait and mobility  ONSET DATE: Chronic  SUBJECTIVE:                                                                                                                                                                                           SUBJECTIVE STATEMENT: Pt presents to PT with reports of chronic LBP that occasionally refers into bilateral hip. Denies N/T into LE except occasional foot numbness bilaterally. No trauma or MOI, notes forward flexion increases pain and lying supine helps. Also denies bowel/bladder changes or saddle anesthesia.    PERTINENT HISTORY:  HTN, R TKA  PAIN:  Are you having pain?  Yes: NPRS scale: 0/10 Worst: 8/10 Pain location: lower back, bilateral hip Pain description: sharp, tight Aggravating factors: morning, bending forward, sweeping Relieving factors: medication  PRECAUTIONS: None  RED FLAGS: None   WEIGHT BEARING RESTRICTIONS: No  FALLS:  Has patient fallen in last 6 months? Yes. Number of falls - mechanical fall while in the yard  LIVING ENVIRONMENT: Lives with: lives alone Lives in: House/apartment  OCCUPATION: Retired  PLOF: Independent  PATIENT GOALS: improve comfort with ADLs, decrease back pain  OBJECTIVE:  Note: Objective measures were completed at Evaluation unless otherwise noted.  DIAGNOSTIC FINDINGS:  N/A  PATIENT SURVEYS:  Modified Oswestry: 27/50 - 54% disability  COGNITION: Overall cognitive status: Within functional limits for tasks assessed     SENSATION: WFL  POSTURE: rounded shoulders, forward head, and increased lumbar lordosis  PALPATION: Slight TTP to bilateral lumbar paraspinals  LUMBAR ROM:   AROM eval  Flexion 75%  Extension 50%  Right lateral flexion   Left lateral flexion   Right rotation   Left rotation    (Blank rows = not tested)  LOWER EXTREMITY MMT:    MMT Right eval Left eval  Hip flexion 3+/5 3+/5  Hip extension    Hip abduction 3+/5 3+/5  Hip adduction    Hip internal rotation    Hip external rotation    Knee flexion 5/5 5/5  Knee extension 5/5 5/5  Ankle dorsiflexion    Ankle plantarflexion    Ankle inversion    Ankle eversion     (Blank rows = not tested)  LUMBAR SPECIAL TESTS:  Straight leg raise test: Negative and Slump test: Positive  FUNCTIONAL TESTS:  Five Time Sit to Stand: 36 seconds with UE  GAIT: Distance walked: 82ft Assistive device utilized: None Level of assistance: Complete Independence Comments: trunk flexed, decreased gait speed  TREATMENT: [facility] Adult PT Treatment:  DATE: 12/25/2023 Therapeutic Exercise: Seated sciatic nerve glide x 10 each Supine PPT x 5 - 5" hold Bridge x 5  Supine clam x 10 blue band Standing lumbar ext x 5  PATIENT EDUCATION:  Education details: eval findings, ODI, HEP, POC Person educated: Patient Education method: Explanation, Demonstration, and Handouts Education comprehension: verbalized understanding and returned demonstration  HOME EXERCISE PROGRAM: Access Code: [email or web address] URL: [email or web address] Date: 12/25/2023 Prepared by: Edwinna Areola  Exercises - Seated Sciatic Tensioner  - 1 x daily - 7 x weekly - 2 sets - 15 reps - Supine Posterior Pelvic Tilt  - 1 x daily - 7 x weekly - 2 sets - 10 reps - 5 sec hold - Supine Bridge  - 1 x daily - 7 x weekly - 2 sets - 10 reps - Hooklying Clamshell with Resistance  - 1 x daily - 7 x weekly - 2-3 sets - 15 reps - blue band hold - Standing Lumbar Extension with Counter  - 1 x daily - 7 x weekly - 2 sets - 10 reps  ASSESSMENT:  CLINICAL IMPRESSION: Patient is a 68 y.o. F who was seen today for physical therapy evaluation and treatment for chronic LBP. Physical findings are consistent with MD impression as pt demonstrates core and proximal hip weakness.  ODI demonstrates severe disability in performance of home ADLs and community activities. Pt would benefit from skilled PT services working on improving core and hip strength in order to decrease pain and improve comfort.   OBJECTIVE IMPAIRMENTS: decreased activity tolerance, decreased balance, decreased mobility, difficulty walking, decreased ROM, decreased strength, postural dysfunction, and pain   ACTIVITY LIMITATIONS: carrying, lifting, sitting, standing, squatting, stairs, transfers, and locomotion level  PARTICIPATION LIMITATIONS: driving, shopping, community activity, occupation, and yard work  PERSONAL FACTORS: Time since onset of injury/illness/exacerbation and 1-2  comorbidities: HTN, R TKA  are also affecting patient's functional outcome.   REHAB POTENTIAL: Good  CLINICAL DECISION MAKING: Stable/uncomplicated  EVALUATION COMPLEXITY: Low   GOALS: Goals reviewed with patient? No  SHORT TERM GOALS: Target date: 01/15/2024   Pt will be compliant and knowledgeable with initial HEP for improved comfort and carryover Baseline: initial HEP given  Goal status: INITIAL  2.  Pt will self report low back pain no greater than 5/10 for improved comfort and functional ability Baseline: 8/10 at worst Goal status: INITIAL   LONG TERM GOALS: Target date: 02/19/2024   Pt will be decrease ODI disability score to no greater than 40% (20/50) as proxy for functional improvement Baseline: 54% disability (27/50) Goal status: INITIAL  2.  Pt will self report low back pain no greater than 1-2/10 for improved comfort and functional ability Baseline: 8/10 at worst Goal status: INITIAL   3.  Pt will decrease Five Time Sit to Stand time to no less than 22 seconds for improved balance, strength, and functional mobility Baseline: 36 seconds with UE Goal status: INITIAL    4.  Pt will improve bilateral LE MMT to no less than 4/5 for improved functional mobility and decreased back pain Baseline:  Goal status: INITIAL  PLAN:  PT FREQUENCY: 2x/week  PT DURATION: 8 weeks  PLANNED INTERVENTIONS: [id number]- PT Re-evaluation, 97110-Therapeutic exercises, 97530- Therapeutic activity, [id number]- Neuromuscular re-education, 97535- Self Care, [id number]- Manual therapy, [id number]- Gait training, [id number]- Electrical stimulation (unattended), [id number]- Electrical stimulation (manual), Dry Needling, Cryotherapy, and Moist heat.  PLAN FOR NEXT SESSION: assess HEP response, neutral spine core and hip strengthening   Eloy End, PT 12/25/2023, 12:59  PM

## 2023-12-25 ENCOUNTER — Ambulatory Visit: Attending: Family Medicine

## 2023-12-25 ENCOUNTER — Other Ambulatory Visit: Payer: Self-pay

## 2023-12-25 DIAGNOSIS — R2689 Other abnormalities of gait and mobility: Secondary | ICD-10-CM | POA: Insufficient documentation

## 2023-12-25 DIAGNOSIS — M6281 Muscle weakness (generalized): Secondary | ICD-10-CM | POA: Diagnosis present

## 2023-12-25 DIAGNOSIS — M5459 Other low back pain: Secondary | ICD-10-CM | POA: Diagnosis present

## 2023-12-25 DIAGNOSIS — M5440 Lumbago with sciatica, unspecified side: Secondary | ICD-10-CM | POA: Insufficient documentation

## 2024-01-05 ENCOUNTER — Encounter: Payer: Self-pay | Admitting: Physical Therapy

## 2024-01-05 ENCOUNTER — Ambulatory Visit: Admitting: Physical Therapy

## 2024-01-05 ENCOUNTER — Encounter (INDEPENDENT_AMBULATORY_CARE_PROVIDER_SITE_OTHER): Payer: Self-pay | Admitting: *Deleted

## 2024-01-05 DIAGNOSIS — M6281 Muscle weakness (generalized): Secondary | ICD-10-CM

## 2024-01-05 DIAGNOSIS — M5459 Other low back pain: Secondary | ICD-10-CM

## 2024-01-05 DIAGNOSIS — R2689 Other abnormalities of gait and mobility: Secondary | ICD-10-CM

## 2024-01-05 NOTE — Therapy (Signed)
 OUTPATIENT PHYSICAL THERAPY THORACOLUMBAR TREATMENT   Patient Name: Tiffany Montes MRN: [id number] DOB:1956/04/30, 68 y.o., female Today's Date: 01/05/2024  END OF SESSION:  PT End of Session - 01/05/24 1315     Visit Number 2    Number of Visits 17    Date for PT Re-Evaluation 02/19/24    Authorization Type MCR    PT Start Time 1315    PT Stop Time 1355    PT Time Calculation (min) 40 min             Past Medical History:  Diagnosis Date   ADHD    Anxiety    Attention deficit disorder    Back pain    Breast cancer (HCC) 2019   Left Breast Cancer   Depression    Gallbladder problem    Gallstones 09/05/2011   Hyperlipidemia    Hypertension    Hypothyroidism    Joint pain    Migraines    Neuropathy    Personal history of chemotherapy 2019   Left Breast Cancer   Personal history of radiation therapy 2019   Left Breast Cancer   PONV (postoperative nausea and vomiting)    diffficulty waking up   Thyroid disease    hypothyroid   Past Surgical History:  Procedure Laterality Date   ABDOMINAL HYSTERECTOMY  2003   BREAST BIOPSY Left 2014   benign   BREAST LUMPECTOMY Left 09/07/2018   BREAST LUMPECTOMY WITH RADIOACTIVE SEED AND SENTINEL LYMPH NODE BIOPSY Left 09/07/2018   Procedure: LEFT BREAST LUMPECTOMY WITH RADIOACTIVE SEED AND AXILLARY SENTINEL LYMPH NODE BIOPSY,INJECT BLUE DYE LEFT BREAST;  Surgeon: Claud Kelp, MD;  Location: [facility] OR;  Service: General;  Laterality: Left;   CHOLECYSTECTOMY  09/26/2011   Procedure: LAPAROSCOPIC CHOLECYSTECTOMY WITH INTRAOPERATIVE CHOLANGIOGRAM;  Surgeon: Currie Paris, MD;  Location: [facility] OR;  Service: General;  Laterality: N/A;   COLONOSCOPY     CYSTIC HYGROMA EXCISION     DENTAL SURGERY     skin graft from top of mouth to lower gums   PORT-A-CATH REMOVAL Right 11/04/2019   Procedure: REMOVAL PORT-A-CATH;  Surgeon: Manus Rudd, MD;  Location: [facility];  Service: General;  Laterality: Right;    PORTACATH PLACEMENT N/A 09/07/2018   Procedure: INSERTION PORT-A-CATH WITH [place];  Surgeon: Claud Kelp, MD;  Location: [facility] OR;  Service: General;  Laterality: N/A;   REPLACEMENT TOTAL KNEE Right    Patient Active Problem List   Diagnosis Date Noted   Depression 01/02/2021   Metabolic syndrome 01/02/2021   Pes anserine bursitis 01/03/2020   Degenerative joint disease of knee, right 01/03/2020   Morbid obesity with BMI of 40.0-44.9, adult (HCC) 02/11/2019   Port-A-Cath in place 10/30/2018   Malignant neoplasm of upper-inner quadrant of left breast in female, estrogen receptor positive (HCC) 08/11/2018   Prediabetes 02/24/2018   Varicose veins of legs 06/03/2017   Mixed hyperlipidemia 01/08/2010   Attention deficit disorder 04/14/2009   Hypothyroidism 04/03/2009   Major depressive disorder, recurrent episode (HCC) 04/03/2009   Essential hypertension 04/03/2009    PCP: Kristian Covey, MD   REFERRING PROVIDER: Kristian Covey, MD   REFERRING DIAG: M54.40 (ICD-10-CM) - Bilateral low back pain with sciatica, sciatica laterality unspecified, unspecified chronicity   Rationale for Evaluation and Treatment: Rehabilitation  THERAPY DIAG:  Other low back pain  Muscle weakness (generalized)  Other abnormalities of gait and mobility  ONSET DATE: Chronic  SUBJECTIVE:  SUBJECTIVE STATEMENT: I take Aleve every 12 hours but sometimes it does not help.   Pt presents to PT with reports of chronic LBP that occasionally refers into bilateral hip. Denies N/T into LE except occasional foot numbness bilaterally. No trauma or MOI, notes forward flexion increases pain and lying supine helps. Also denies bowel/bladder changes or saddle anesthesia.   PERTINENT HISTORY:  HTN, R TKA  PAIN:  Are you having  pain?  Yes: NPRS scale: 0/10 In sitting Worst: 8/10 Pain location: lower back, bilateral hip Pain description: sharp, tight Aggravating factors: morning, bending forward, sweeping Relieving factors: medication  PRECAUTIONS: None  RED FLAGS: None   WEIGHT BEARING RESTRICTIONS: No  FALLS:  Has patient fallen in last 6 months? Yes. Number of falls - mechanical fall while in the yard  LIVING ENVIRONMENT: Lives with: lives alone Lives in: House/apartment  OCCUPATION: Retired  PLOF: Independent  PATIENT GOALS: improve comfort with ADLs, decrease back pain  OBJECTIVE:  Note: Objective measures were completed at Evaluation unless otherwise noted.  DIAGNOSTIC FINDINGS:  N/A  PATIENT SURVEYS:  Modified Oswestry: 27/50 - 54% disability  COGNITION: Overall cognitive status: Within functional limits for tasks assessed     SENSATION: WFL  POSTURE: rounded shoulders, forward head, and increased lumbar lordosis  PALPATION: Slight TTP to bilateral lumbar paraspinals  LUMBAR ROM:   AROM eval  Flexion 75%  Extension 50%  Right lateral flexion   Left lateral flexion   Right rotation   Left rotation    (Blank rows = not tested)  LOWER EXTREMITY MMT:    MMT Right eval Left eval  Hip flexion 3+/5 3+/5  Hip extension    Hip abduction 3+/5 3+/5  Hip adduction    Hip internal rotation    Hip external rotation    Knee flexion 5/5 5/5  Knee extension 5/5 5/5  Ankle dorsiflexion    Ankle plantarflexion    Ankle inversion    Ankle eversion     (Blank rows = not tested)  LUMBAR SPECIAL TESTS:  Straight leg raise test: Negative and Slump test: Positive  FUNCTIONAL TESTS:  Five Time Sit to Stand: 36 seconds with UE  GAIT: Distance walked: 78ft Assistive device utilized: None Level of assistance: Complete Independence Comments: trunk flexed, decreased gait speed  TREATMENT: [facility] Adult PT Treatment:                                                DATE:  01/05/24 Therapeutic Exercise: Nustep L5 UE/LE x 5 minutes Supine  PPT  Supine PPT to Bridge SKTC  LTR Sciatic Nerve tensioner bilat  Updated HEP    [facility] Adult PT Treatment:                                                DATE: 12/25/2023 Therapeutic Exercise: Seated sciatic nerve glide x 10 each Supine PPT x 5 - 5" hold Bridge x 5  Supine clam x 10 blue band Standing lumbar ext x 5  PATIENT EDUCATION:  Education details: eval findings, ODI, HEP, POC Person educated: Patient Education method: Explanation, Demonstration, and Handouts Education comprehension: verbalized understanding and returned demonstration  HOME EXERCISE PROGRAM: Access Code: [email or web address] URL: [email or web address] Date: 12/25/2023  Prepared by: Edwinna Areola  Exercises - Seated Sciatic Tensioner  - 1 x daily - 7 x weekly - 2 sets - 15 reps - Supine Posterior Pelvic Tilt  - 1 x daily - 7 x weekly - 2 sets - 10 reps - 5 sec hold - Supine Bridge  - 1 x daily - 7 x weekly - 2 sets - 10 reps - Hooklying Clamshell with Resistance  - 1 x daily - 7 x weekly - 2-3 sets - 15 reps - blue band hold - Standing Lumbar Extension with Counter  - 1 x daily - 7 x weekly - 2 sets - 10 reps Added Single knee to chest    ASSESSMENT:  CLINICAL IMPRESSION: Pt reports increased pain after doing yard work over the weekend. Reviewed HEP and progressed with lumbar mobility. Pt did fatigue with prescribed reps and felt a little sore at end of session.    EVAL: Patient is a 68 y.o. F who was seen today for physical therapy evaluation and treatment for chronic LBP. Physical findings are consistent with MD impression as pt demonstrates core and proximal hip weakness.  ODI demonstrates severe disability in performance of home ADLs and community activities. Pt would benefit from skilled PT services working on improving core and hip strength in order to decrease pain and improve comfort.   OBJECTIVE IMPAIRMENTS: decreased  activity tolerance, decreased balance, decreased mobility, difficulty walking, decreased ROM, decreased strength, postural dysfunction, and pain   ACTIVITY LIMITATIONS: carrying, lifting, sitting, standing, squatting, stairs, transfers, and locomotion level  PARTICIPATION LIMITATIONS: driving, shopping, community activity, occupation, and yard work  PERSONAL FACTORS: Time since onset of injury/illness/exacerbation and 1-2 comorbidities: HTN, R TKA  are also affecting patient's functional outcome.   REHAB POTENTIAL: Good  CLINICAL DECISION MAKING: Stable/uncomplicated  EVALUATION COMPLEXITY: Low   GOALS: Goals reviewed with patient? No  SHORT TERM GOALS: Target date: 01/15/2024   Pt will be compliant and knowledgeable with initial HEP for improved comfort and carryover Baseline: initial HEP given  Goal status: INITIAL  2.  Pt will self report low back pain no greater than 5/10 for improved comfort and functional ability Baseline: 8/10 at worst Goal status: INITIAL   LONG TERM GOALS: Target date: 02/19/2024   Pt will be decrease ODI disability score to no greater than 40% (20/50) as proxy for functional improvement Baseline: 54% disability (27/50) Goal status: INITIAL  2.  Pt will self report low back pain no greater than 1-2/10 for improved comfort and functional ability Baseline: 8/10 at worst Goal status: INITIAL   3.  Pt will decrease Five Time Sit to Stand time to no less than 22 seconds for improved balance, strength, and functional mobility Baseline: 36 seconds with UE Goal status: INITIAL    4.  Pt will improve bilateral LE MMT to no less than 4/5 for improved functional mobility and decreased back pain Baseline:  Goal status: INITIAL  PLAN:  PT FREQUENCY: 2x/week  PT DURATION: 8 weeks  PLANNED INTERVENTIONS: 97164- PT Re-evaluation, 97110-Therapeutic exercises, 97530- Therapeutic activity, [id number]- Neuromuscular re-education, 97535- Self Care, 11914- Manual  therapy, [id number]- Gait training, [id number]- Electrical stimulation (unattended), [id number]- Electrical stimulation (manual), Dry Needling, Cryotherapy, and Moist heat.  PLAN FOR NEXT SESSION: assess HEP response, neutral spine core and hip strengthening   Jannette Spanner, PTA 01/05/24 2:28 PM Phone: [phone] Fax: [phone]

## 2024-01-06 ENCOUNTER — Ambulatory Visit (INDEPENDENT_AMBULATORY_CARE_PROVIDER_SITE_OTHER): Admitting: Family Medicine

## 2024-01-06 ENCOUNTER — Encounter (INDEPENDENT_AMBULATORY_CARE_PROVIDER_SITE_OTHER): Payer: Self-pay | Admitting: Family Medicine

## 2024-01-06 VITALS — BP 139/74 | HR 68 | Temp 97.6°F | Ht 62.0 in | Wt 203.0 lb

## 2024-01-06 DIAGNOSIS — R0602 Shortness of breath: Secondary | ICD-10-CM | POA: Diagnosis not present

## 2024-01-06 DIAGNOSIS — Z6837 Body mass index (BMI) 37.0-37.9, adult: Secondary | ICD-10-CM

## 2024-01-06 DIAGNOSIS — E559 Vitamin D deficiency, unspecified: Secondary | ICD-10-CM | POA: Diagnosis not present

## 2024-01-06 DIAGNOSIS — Z1331 Encounter for screening for depression: Secondary | ICD-10-CM

## 2024-01-06 DIAGNOSIS — R5383 Other fatigue: Secondary | ICD-10-CM

## 2024-01-06 DIAGNOSIS — I10 Essential (primary) hypertension: Secondary | ICD-10-CM

## 2024-01-06 DIAGNOSIS — E782 Mixed hyperlipidemia: Secondary | ICD-10-CM | POA: Diagnosis not present

## 2024-01-06 DIAGNOSIS — E039 Hypothyroidism, unspecified: Secondary | ICD-10-CM | POA: Diagnosis not present

## 2024-01-06 DIAGNOSIS — R7303 Prediabetes: Secondary | ICD-10-CM

## 2024-01-06 DIAGNOSIS — E538 Deficiency of other specified B group vitamins: Secondary | ICD-10-CM

## 2024-01-06 DIAGNOSIS — E669 Obesity, unspecified: Secondary | ICD-10-CM

## 2024-01-07 NOTE — Progress Notes (Signed)
 Office: [phone]  /  Fax: [phone]  WEIGHT SUMMARY AND BIOMETRICS  Anthropometric Measurements Height: 5\' 2"  (1.575 m) Weight: 203 lb (92.1 kg) BMI (Calculated): 37.12 Weight at Last Visit: N/A Weight Lost Since Last Visit: N/A Weight Gained Since Last Visit: N/A Starting Weight: 203 lb Peak Weight: 215 lb Waist Measurement : 47 inches   Body Composition  Body Fat %: 50.4 % Fat Mass (lbs): 102.8 lbs Muscle Mass (lbs): 95.8 lbs Total Body Water (lbs): 70.4 lbs Visceral Fat Rating : 16   Other Clinical Data RMR: 1382 Fasting: yes Labs: yes Today's Visit #: 1 Starting Date: 01/06/24    Chief Complaint: OBESITY    History of Present Illness Tiffany Montes is a 68 year old female who presents for evaluation of weight management and associated symptoms. She was a patient of ours previously and is ready to start back on her weight loss efforts.  She experiences weight gain attributed to stress and depression, leading to a lack of exercise. She aims to lose about sixty pounds within a year to improve her health. Her eating habits include dining out one or two times a week and consuming fast food or takeout once a week. Living alone and experiencing back pain present obstacles to cooking. She craves sweets, especially after dinner, and frequently snacks in the evening, often skipping one meal a day. She does not drink regular sodas but enjoys coffee with cream and sugar. She struggles with poor food choices, excessive hunger, portion control, and emotional eating, particularly when stressed or bored. She sometimes feels out of control with her eating and experiences guilt after making poor food choices. Two or three times a week, she eats larger portions than average. She has not been diagnosed with an eating disorder in the past.  She experiences fatigue and shortness of breath with exercise, which have worsened with weight gain. She is currently not engaging in  any exercise.      PHYSICAL EXAM:  Blood pressure 139/74, pulse 68, temperature 97.6 F (36.4 C), height 5\' 2"  (1.575 m), weight 203 lb (92.1 kg), SpO2 95%. Body mass index is 37.13 kg/m.  DIAGNOSTIC DATA REVIEWED:  BMET    Component Value Date/Time   NA 139 01/06/2024 1008   K 3.9 01/06/2024 1008   CL 101 01/06/2024 1008   CO2 19 (L) 01/06/2024 1008   GLUCOSE 106 (H) 01/06/2024 1008   GLUCOSE 115 (H) 10/28/2023 1048   BUN 26 01/06/2024 1008   CREATININE 0.80 01/06/2024 1008   CREATININE 0.85 10/28/2023 1048   CALCIUM 9.1 01/06/2024 1008   GFRNONAA >60 10/28/2023 1048   GFRAA >60 04/26/2020 1323   GFRAA >60 01/22/2019 0915   Lab Results  Component Value Date   HGBA1C 5.8 (H) 01/06/2024   HGBA1C 6.0 01/28/2018   Lab Results  Component Value Date   INSULIN WILL FOLLOW 01/06/2024   INSULIN 31.2 (H) 11/23/2020   Lab Results  Component Value Date   TSH 4.260 01/06/2024   CBC    Component Value Date/Time   WBC 5.5 01/06/2024 1008   WBC 6.1 10/28/2023 1048   WBC 5.9 07/15/2023 1122   RBC 5.03 01/06/2024 1008   RBC 4.68 10/28/2023 1048   HGB 14.4 01/06/2024 1008   HCT 43.6 01/06/2024 1008   PLT 109 (L) 01/06/2024 1008   MCV 87 01/06/2024 1008   MCH 28.6 01/06/2024 1008   MCH 28.8 10/28/2023 1048   MCHC 33.0 01/06/2024 1008   MCHC  33.1 10/28/2023 1048   RDW 12.8 01/06/2024 1008   Iron Studies    Component Value Date/Time   FERRITIN 48 11/23/2020 1122   Lipid Panel     Component Value Date/Time   CHOL 238 (H) 01/06/2024 1008   TRIG 103 01/06/2024 1008   HDL 65 01/06/2024 1008   CHOLHDL 4 07/15/2023 1122   VLDL 23.0 07/15/2023 1122   LDLCALC 155 (H) 01/06/2024 1008   LDLDIRECT 136.7 09/15/2012 0801   Hepatic Function Panel     Component Value Date/Time   PROT 7.1 01/06/2024 1008   ALBUMIN 4.6 01/06/2024 1008   AST 23 01/06/2024 1008   AST 18 10/28/2023 1048   ALT 12 01/06/2024 1008   ALT 13 10/28/2023 1048   ALKPHOS 55 01/06/2024 1008    BILITOT <0.2 01/06/2024 1008   BILITOT 0.4 10/28/2023 1048   BILIDIR 0.0 07/15/2023 1122      Component Value Date/Time   TSH 4.260 01/06/2024 1008   Nutritional Lab Results  Component Value Date   VD25OH 79.3 01/06/2024   VD25OH 53.1 11/23/2020     Assessment and Plan Assessment & Plan Obesity Experiencing obesity-related fatigue and dyspnea on exertion, exacerbated by weight gain. Contributing factors include stress, depression, poor dietary choices, portion control issues, emotional eating, and postprandial cravings for sweets. Resting energy expenditure is lower than expected at 1382 kcal/day, and basal metabolic rate is 5784 kcal/day. Positive mood and food score of 13 indicates some depression with emotional eating behaviors, and an Epworth sleepiness score of 7 suggests mild sleepiness. Initiated on category one eating plan to address these issues. - Adhere to category one eating plan without focusing on meal timing. - Avoid weighing herself at home between visits and focus on following the eating plan. - Introduce exercise after completing the evaluation.  Mixed Hyperlipidemia Mixed hyperlipidemia requires monitoring and management to reduce cardiovascular risk. - Check labs to monitor lipid levels. - Adhere to category one eating plan without focusing on meal timing.  Essential Hypertension Essential hypertension requires ongoing monitoring and management to prevent complications. - Check labs to monitor blood pressure-related parameters. - Adhere to category one eating plan without focusing on meal timing.  Prediabetes Prediabetes indicates an increased risk for developing type 2 diabetes. Lifestyle modifications are essential to prevent progression. - Check labs to monitor glucose levels. - Adhere to category one eating plan without focusing on meal timing.  Acquired Hypothyroidism Acquired hypothyroidism requires regular monitoring and management to ensure adequate  thyroid hormone levels. - Check labs to assess thyroid function.  Vitamin D Deficiency Vitamin D deficiency may contribute to fatigue and requires supplementation and monitoring. - Check labs to assess vitamin D levels.  Vitamin B12 Deficiency Vitamin B12 deficiency may contribute to fatigue and requires supplementation and monitoring. - Check labs to assess vitamin B12 levels.  Fatigue and shortness of breath with exercise -worsening with weight gain -Check labs to look for other causes of fatigue -Work on diet, exercise and weight loss to improve symptoms    I have personally spent 45 minutes total time today in preparation, patient care, and documentation for this visit, including the following: review of clinical lab tests; review of medical tests/procedures/services.    She was informed of the importance of frequent follow up visits to maximize her success with intensive lifestyle modifications for her multiple health conditions.    Quillian Quince, MD

## 2024-01-08 LAB — CMP14+EGFR
ALT: 12 IU/L (ref 0–32)
AST: 23 IU/L (ref 0–40)
Albumin: 4.6 g/dL (ref 3.9–4.9)
Alkaline Phosphatase: 55 IU/L (ref 44–121)
BUN/Creatinine Ratio: 33 — ABNORMAL HIGH (ref 12–28)
BUN: 26 mg/dL (ref 8–27)
Bilirubin Total: <0.2 mg/dL (ref 0.0–1.2)
CO2: 19 mmol/L — ABNORMAL LOW (ref 20–29)
Calcium: 9.1 mg/dL (ref 8.7–10.3)
Chloride: 101 mmol/L (ref 96–106)
Creatinine, Ser: 0.8 mg/dL (ref 0.57–1.00)
Globulin, Total: 2.5 g/dL (ref 1.5–4.5)
Glucose: 106 mg/dL — ABNORMAL HIGH (ref 70–99)
Potassium: 3.9 mmol/L (ref 3.5–5.2)
Sodium: 139 mmol/L (ref 134–144)
Total Protein: 7.1 g/dL (ref 6.0–8.5)
eGFR: 80 mL/min/{1.73_m2} (ref >59)

## 2024-01-08 LAB — VITAMIN B12: Vitamin B-12: 522 pg/mL (ref 232–1245)

## 2024-01-08 LAB — CBC WITH DIFFERENTIAL/PLATELET
Basophils Absolute: 0.1 10*3/uL (ref 0.0–0.2)
Basos: 1 %
EOS (ABSOLUTE): 0.3 10*3/uL (ref 0.0–0.4)
Eos: 5 %
Hematocrit: 43.6 % (ref 34.0–46.6)
Hemoglobin: 14.4 g/dL (ref 11.1–15.9)
Immature Grans (Abs): 0 10*3/uL (ref 0.0–0.1)
Immature Granulocytes: 0 %
Lymphocytes Absolute: 1.4 10*3/uL (ref 0.7–3.1)
Lymphs: 25 %
MCH: 28.6 pg (ref 26.6–33.0)
MCHC: 33 g/dL (ref 31.5–35.7)
MCV: 87 fL (ref 79–97)
Monocytes Absolute: 0.4 10*3/uL (ref 0.1–0.9)
Monocytes: 7 %
Neutrophils Absolute: 3.4 10*3/uL (ref 1.4–7.0)
Neutrophils: 62 %
Platelets: 109 10*3/uL — ABNORMAL LOW (ref 150–450)
RBC: 5.03 x10E6/uL (ref 3.77–5.28)
RDW: 12.8 % (ref 11.7–15.4)
WBC: 5.5 10*3/uL (ref 3.4–10.8)

## 2024-01-08 LAB — HEMOGLOBIN A1C
Est. average glucose Bld gHb Est-mCnc: 120 mg/dL
Hgb A1c MFr Bld: 5.8 % — ABNORMAL HIGH (ref 4.8–5.6)

## 2024-01-08 LAB — FOLATE: Folate: 18.3 ng/mL (ref >3.0)

## 2024-01-08 LAB — TSH: TSH: 4.26 u[IU]/mL (ref 0.450–4.500)

## 2024-01-08 LAB — LIPID PANEL WITH LDL/HDL RATIO
Cholesterol, Total: 238 mg/dL — ABNORMAL HIGH (ref 100–199)
HDL: 65 mg/dL (ref >39)
LDL Chol Calc (NIH): 155 mg/dL — ABNORMAL HIGH (ref 0–99)
LDL/HDL Ratio: 2.4 ratio (ref 0.0–3.2)
Triglycerides: 103 mg/dL (ref 0–149)
VLDL Cholesterol Cal: 18 mg/dL (ref 5–40)

## 2024-01-08 LAB — INSULIN, RANDOM: INSULIN: 8.9 u[IU]/mL (ref 2.6–24.9)

## 2024-01-08 LAB — VITAMIN D 25 HYDROXY (VIT D DEFICIENCY, FRACTURES): Vit D, 25-Hydroxy: 79.3 ng/mL (ref 30.0–100.0)

## 2024-01-09 ENCOUNTER — Ambulatory Visit: Admitting: Physical Therapy

## 2024-01-13 ENCOUNTER — Ambulatory Visit: Attending: Family Medicine

## 2024-01-13 DIAGNOSIS — R2689 Other abnormalities of gait and mobility: Secondary | ICD-10-CM | POA: Diagnosis present

## 2024-01-13 DIAGNOSIS — M5459 Other low back pain: Secondary | ICD-10-CM | POA: Diagnosis present

## 2024-01-13 DIAGNOSIS — M6281 Muscle weakness (generalized): Secondary | ICD-10-CM | POA: Insufficient documentation

## 2024-01-13 NOTE — Therapy (Signed)
 OUTPATIENT PHYSICAL THERAPY TREATMENT   Patient Name: Tiffany Montes MRN: [id number] DOB:1956-08-09, 68 y.o., female Today's Date: 01/13/2024  END OF SESSION:  PT End of Session - 01/13/24 0852     Visit Number 3    Number of Visits 17    Date for PT Re-Evaluation 02/19/24    Authorization Type MCR    PT Start Time 0849    PT Stop Time 0927    PT Time Calculation (min) 38 min    Activity Tolerance Patient tolerated treatment well    Behavior During Therapy [facility] for tasks assessed/performed              Past Medical History:  Diagnosis Date   ADHD    Anxiety    Attention deficit disorder    Back pain    Breast cancer (HCC) 2019   Left Breast Cancer   Depression    Gallbladder problem    Gallstones 09/05/2011   Hyperlipidemia    Hypertension    Hypothyroidism    Joint pain    Migraines    Neuropathy    Personal history of chemotherapy 2019   Left Breast Cancer   Personal history of radiation therapy 2019   Left Breast Cancer   PONV (postoperative nausea and vomiting)    diffficulty waking up   Spinal stenosis    Thyroid disease    hypothyroid   Past Surgical History:  Procedure Laterality Date   ABDOMINAL HYSTERECTOMY  2003   BREAST BIOPSY Left 2014   benign   BREAST LUMPECTOMY Left 09/07/2018   BREAST LUMPECTOMY WITH RADIOACTIVE SEED AND SENTINEL LYMPH NODE BIOPSY Left 09/07/2018   Procedure: LEFT BREAST LUMPECTOMY WITH RADIOACTIVE SEED AND AXILLARY SENTINEL LYMPH NODE BIOPSY,INJECT BLUE DYE LEFT BREAST;  Surgeon: Claud Kelp, MD;  Location: [facility] OR;  Service: General;  Laterality: Left;   CATARACT EXTRACTION Bilateral 02/2005   CHOLECYSTECTOMY  09/26/2011   Procedure: LAPAROSCOPIC CHOLECYSTECTOMY WITH INTRAOPERATIVE CHOLANGIOGRAM;  Surgeon: Currie Paris, MD;  Location: [facility] OR;  Service: General;  Laterality: N/A;   COLONOSCOPY     CYSTIC HYGROMA EXCISION     DENTAL SURGERY     skin graft from top of mouth to lower gums   GALLBLADDER  SURGERY  11/2016   HYSTEROTOMY  05/2012   PORT-A-CATH REMOVAL Right 11/04/2019   Procedure: REMOVAL PORT-A-CATH;  Surgeon: Manus Rudd, MD;  Location: [facility];  Service: General;  Laterality: Right;   PORTACATH PLACEMENT N/A 09/07/2018   Procedure: INSERTION PORT-A-CATH WITH [place];  Surgeon: Claud Kelp, MD;  Location: [facility] OR;  Service: General;  Laterality: N/A;   REPLACEMENT TOTAL KNEE Right    Patient Active Problem List   Diagnosis Date Noted   Depression 01/02/2021   Metabolic syndrome 01/02/2021   Pes anserine bursitis 01/03/2020   Degenerative joint disease of knee, right 01/03/2020   Morbid obesity with BMI of 40.0-44.9, adult (HCC) 02/11/2019   Port-A-Cath in place 10/30/2018   Malignant neoplasm of upper-inner quadrant of left breast in female, estrogen receptor positive (HCC) 08/11/2018   Prediabetes 02/24/2018   Varicose veins of legs 06/03/2017   Mixed hyperlipidemia 01/08/2010   Attention deficit disorder 04/14/2009   Hypothyroidism 04/03/2009   Major depressive disorder, recurrent episode (HCC) 04/03/2009   Essential hypertension 04/03/2009    PCP: Kristian Covey, MD   REFERRING PROVIDER: Kristian Covey, MD   REFERRING DIAG: M54.40 (ICD-10-CM) - Bilateral low back pain with sciatica, sciatica laterality unspecified, unspecified chronicity  Rationale for Evaluation and Treatment: Rehabilitation  THERAPY DIAG:  Other low back pain  Muscle weakness (generalized)  Other abnormalities of gait and mobility  ONSET DATE: Chronic  SUBJECTIVE:                                                                                                                                                                                           SUBJECTIVE STATEMENT: Pt presents to PT with reports of increased LBP, which she notes is normal for the mornings. Has been compliant with HEP.   Pt presents to PT with reports of chronic LBP that occasionally  refers into bilateral hip. Denies N/T into LE except occasional foot numbness bilaterally. No trauma or MOI, notes forward flexion increases pain and lying supine helps. Also denies bowel/bladder changes or saddle anesthesia.   PERTINENT HISTORY:  HTN, R TKA  PAIN:  Are you having pain?  Yes: NPRS scale: 0/10 In sitting Worst: 8/10 Pain location: lower back, bilateral hip Pain description: sharp, tight Aggravating factors: morning, bending forward, sweeping Relieving factors: medication  PRECAUTIONS: None  RED FLAGS: None   WEIGHT BEARING RESTRICTIONS: No  FALLS:  Has patient fallen in last 6 months? Yes. Number of falls - mechanical fall while in the yard  LIVING ENVIRONMENT: Lives with: lives alone Lives in: House/apartment  OCCUPATION: Retired  PLOF: Independent  PATIENT GOALS: improve comfort with ADLs, decrease back pain  OBJECTIVE:  Note: Objective measures were completed at Evaluation unless otherwise noted.  DIAGNOSTIC FINDINGS:  N/A  PATIENT SURVEYS:  Modified Oswestry: 27/50 - 54% disability  COGNITION: Overall cognitive status: Within functional limits for tasks assessed     SENSATION: WFL  POSTURE: rounded shoulders, forward head, and increased lumbar lordosis  PALPATION: Slight TTP to bilateral lumbar paraspinals  LUMBAR ROM:   AROM eval  Flexion 75%  Extension 50%  Right lateral flexion   Left lateral flexion   Right rotation   Left rotation    (Blank rows = not tested)  LOWER EXTREMITY MMT:    MMT Right eval Left eval  Hip flexion 3+/5 3+/5  Hip extension    Hip abduction 3+/5 3+/5  Hip adduction    Hip internal rotation    Hip external rotation    Knee flexion 5/5 5/5  Knee extension 5/5 5/5  Ankle dorsiflexion    Ankle plantarflexion    Ankle inversion    Ankle eversion     (Blank rows = not tested)  LUMBAR SPECIAL TESTS:  Straight leg raise test: Negative and Slump test: Positive  FUNCTIONAL TESTS:  Five Time  Sit to Stand: 36 seconds with  UE  GAIT: Distance walked: 26ft Assistive device utilized: None Level of assistance: Complete Independence Comments: trunk flexed, decreased gait speed  TREATMENT: [facility] Adult PT Treatment:                                                DATE: 01/13/24 Therapeutic Activity: Nustep L5 UE/LE x 5 minutes for functional activity tolerance STS 2x10 - low table with UE LAQ 2x10 2.5# Therapeutic Exercise: LTR x 10 Supine PPT x 10 - 5" hold Supine PPT with ball 2x10  Supine pilates SLR 2x10 each Seated repeated flexion with physioball 2x10 Seated hamstring stretch 2x30" each Modalites: MHP to lumbar spine during supine exercises  [facility] Adult PT Treatment:                                                DATE: 01/05/24 Therapeutic Exercise: Nustep L5 UE/LE x 5 minutes Supine  PPT  Supine PPT to Bridge SKTC  LTR Sciatic Nerve tensioner bilat  Updated HEP    [facility] Adult PT Treatment:                                                DATE: 12/25/2023 Therapeutic Exercise: Seated sciatic nerve glide x 10 each Supine PPT x 5 - 5" hold Bridge x 5  Supine clam x 10 blue band Standing lumbar ext x 5  PATIENT EDUCATION:  Education details: eval findings, ODI, HEP, POC Person educated: Patient Education method: Explanation, Demonstration, and Handouts Education comprehension: verbalized understanding and returned demonstration  HOME EXERCISE PROGRAM: Access Code: [email or web address] URL: [email or web address] Date: 12/25/2023 Prepared by: Edwinna Areola  Exercises - Seated Sciatic Tensioner  - 1 x daily - 7 x weekly - 2 sets - 15 reps - Supine Posterior Pelvic Tilt  - 1 x daily - 7 x weekly - 2 sets - 10 reps - 5 sec hold - Supine Bridge  - 1 x daily - 7 x weekly - 2 sets - 10 reps - Hooklying Clamshell with Resistance  - 1 x daily - 7 x weekly - 2-3 sets - 15 reps - blue band hold - Standing Lumbar Extension with Counter  - 1 x daily - 7 x weekly - 2 sets  - 10 reps Added Single knee to chest    ASSESSMENT:  CLINICAL IMPRESSION: Pt was able to complete all prescribed exercises with no adverse effect. Exercises today focused on continued core and proximal hip strengthening as well as exercises with purpose on improving functional mobility and activity tolerance. Continues to have some difficulty with motor control with neutral spine strengthening but is improving. Will continue to progress as able per POC.   EVAL: Patient is a 68 y.o. F who was seen today for physical therapy evaluation and treatment for chronic LBP. Physical findings are consistent with MD impression as pt demonstrates core and proximal hip weakness.  ODI demonstrates severe disability in performance of home ADLs and community activities. Pt would benefit from skilled PT services working on improving core and hip strength in order to decrease pain and improve comfort.  OBJECTIVE IMPAIRMENTS: decreased activity tolerance, decreased balance, decreased mobility, difficulty walking, decreased ROM, decreased strength, postural dysfunction, and pain   ACTIVITY LIMITATIONS: carrying, lifting, sitting, standing, squatting, stairs, transfers, and locomotion level  PARTICIPATION LIMITATIONS: driving, shopping, community activity, occupation, and yard work  PERSONAL FACTORS: Time since onset of injury/illness/exacerbation and 1-2 comorbidities: HTN, R TKA  are also affecting patient's functional outcome.   REHAB POTENTIAL: Good  CLINICAL DECISION MAKING: Stable/uncomplicated  EVALUATION COMPLEXITY: Low   GOALS: Goals reviewed with patient? No  SHORT TERM GOALS: Target date: 01/15/2024   Pt will be compliant and knowledgeable with initial HEP for improved comfort and carryover Baseline: initial HEP given  Goal status: INITIAL  2.  Pt will self report low back pain no greater than 5/10 for improved comfort and functional ability Baseline: 8/10 at worst Goal status: INITIAL    LONG TERM GOALS: Target date: 02/19/2024   Pt will be decrease ODI disability score to no greater than 40% (20/50) as proxy for functional improvement Baseline: 54% disability (27/50) Goal status: INITIAL  2.  Pt will self report low back pain no greater than 1-2/10 for improved comfort and functional ability Baseline: 8/10 at worst Goal status: INITIAL   3.  Pt will decrease Five Time Sit to Stand time to no less than 22 seconds for improved balance, strength, and functional mobility Baseline: 36 seconds with UE Goal status: INITIAL    4.  Pt will improve bilateral LE MMT to no less than 4/5 for improved functional mobility and decreased back pain Baseline:  Goal status: INITIAL  PLAN:  PT FREQUENCY: 2x/week  PT DURATION: 8 weeks  PLANNED INTERVENTIONS: 97164- PT Re-evaluation, 97110-Therapeutic exercises, 97530- Therapeutic activity, [id number]- Neuromuscular re-education, 97535- Self Care, [id number]- Manual therapy, [id number]- Gait training, U0454- Electrical stimulation (unattended), [id number]- Electrical stimulation (manual), Dry Needling, Cryotherapy, and Moist heat.  PLAN FOR NEXT SESSION: assess HEP response, neutral spine core and hip strengthening   Eloy End PT  01/13/24 9:28 AM

## 2024-01-15 NOTE — Therapy (Signed)
 OUTPATIENT PHYSICAL THERAPY TREATMENT   Patient Name: Tiffany Montes MRN: [id number] DOB:[birth year], 68 y.o., female Today's Date: 01/16/2024  END OF SESSION:  PT End of Session - 01/16/24 0748     Visit Number 4    Number of Visits 17    Date for PT Re-Evaluation 02/19/24    Authorization Type MCR    PT Start Time 0800    PT Stop Time 0840    PT Time Calculation (min) 40 min    Activity Tolerance Patient tolerated treatment well    Behavior During Therapy [facility] for tasks assessed/performed               Past Medical History:  Diagnosis Date   ADHD    Anxiety    Attention deficit disorder    Back pain    Breast cancer (HCC) 2019   Left Breast Cancer   Depression    Gallbladder problem    Gallstones 09/05/2011   Hyperlipidemia    Hypertension    Hypothyroidism    Joint pain    Migraines    Neuropathy    Personal history of chemotherapy 2019   Left Breast Cancer   Personal history of radiation therapy 2019   Left Breast Cancer   PONV (postoperative nausea and vomiting)    diffficulty waking up   Spinal stenosis    Thyroid disease    hypothyroid   Past Surgical History:  Procedure Laterality Date   ABDOMINAL HYSTERECTOMY  2003   BREAST BIOPSY Left 2014   benign   BREAST LUMPECTOMY Left 09/07/2018   BREAST LUMPECTOMY WITH RADIOACTIVE SEED AND SENTINEL LYMPH NODE BIOPSY Left 09/07/2018   Procedure: LEFT BREAST LUMPECTOMY WITH RADIOACTIVE SEED AND AXILLARY SENTINEL LYMPH NODE BIOPSY,INJECT BLUE DYE LEFT BREAST;  Surgeon: Claud Kelp, MD;  Location: [facility] OR;  Service: General;  Laterality: Left;   CATARACT EXTRACTION Bilateral 02/2005   CHOLECYSTECTOMY  09/26/2011   Procedure: LAPAROSCOPIC CHOLECYSTECTOMY WITH INTRAOPERATIVE CHOLANGIOGRAM;  Surgeon: Currie Paris, MD;  Location: [facility] OR;  Service: General;  Laterality: N/A;   COLONOSCOPY     CYSTIC HYGROMA EXCISION     DENTAL SURGERY     skin graft from top of mouth to lower gums   GALLBLADDER  SURGERY  11/2016   HYSTEROTOMY  05/2012   PORT-A-CATH REMOVAL Right 11/04/2019   Procedure: REMOVAL PORT-A-CATH;  Surgeon: Manus Rudd, MD;  Location: [facility];  Service: General;  Laterality: Right;   PORTACATH PLACEMENT N/A 09/07/2018   Procedure: INSERTION PORT-A-CATH WITH [place];  Surgeon: Claud Kelp, MD;  Location: [facility] OR;  Service: General;  Laterality: N/A;   REPLACEMENT TOTAL KNEE Right    Patient Active Problem List   Diagnosis Date Noted   Depression 01/02/2021   Metabolic syndrome 01/02/2021   Pes anserine bursitis 01/03/2020   Degenerative joint disease of knee, right 01/03/2020   Morbid obesity with BMI of 40.0-44.9, adult (HCC) 02/11/2019   Port-A-Cath in place 10/30/2018   Malignant neoplasm of upper-inner quadrant of left breast in female, estrogen receptor positive (HCC) 08/11/2018   Prediabetes 02/24/2018   Varicose veins of legs 06/03/2017   Mixed hyperlipidemia 01/08/2010   Attention deficit disorder 04/14/2009   Hypothyroidism 04/03/2009   Major depressive disorder, recurrent episode (HCC) 04/03/2009   Essential hypertension 04/03/2009    PCP: Kristian Covey, MD   REFERRING PROVIDER: Kristian Covey, MD   REFERRING DIAG: M54.40 (ICD-10-CM) - Bilateral low back pain with sciatica, sciatica laterality unspecified, unspecified  chronicity   Rationale for Evaluation and Treatment: Rehabilitation  THERAPY DIAG:  Other low back pain  Muscle weakness (generalized)  ONSET DATE: Chronic  SUBJECTIVE:                                                                                                                                                                                           SUBJECTIVE STATEMENT: Pt presents to PT with reports of continued lower back and R LE pain. Has numbness down to R foot. Has been fairly compliant with HEP.   Pt presents to PT with reports of chronic LBP that occasionally refers into bilateral hip. Denies  N/T into LE except occasional foot numbness bilaterally. No trauma or MOI, notes forward flexion increases pain and lying supine helps. Also denies bowel/bladder changes or saddle anesthesia.   PERTINENT HISTORY:  HTN, R TKA  PAIN:  Are you having pain?  Yes: NPRS scale: 0/10 In sitting Worst: 8/10 Pain location: lower back, bilateral hip Pain description: sharp, tight Aggravating factors: morning, bending forward, sweeping Relieving factors: medication  PRECAUTIONS: None  RED FLAGS: None   WEIGHT BEARING RESTRICTIONS: No  FALLS:  Has patient fallen in last 6 months? Yes. Number of falls - mechanical fall while in the yard  LIVING ENVIRONMENT: Lives with: lives alone Lives in: House/apartment  OCCUPATION: Retired  PLOF: Independent  PATIENT GOALS: improve comfort with ADLs, decrease back pain  OBJECTIVE:  Note: Objective measures were completed at Evaluation unless otherwise noted.  DIAGNOSTIC FINDINGS:  N/A  PATIENT SURVEYS:  Modified Oswestry: 27/50 - 54% disability  COGNITION: Overall cognitive status: Within functional limits for tasks assessed     SENSATION: WFL  POSTURE: rounded shoulders, forward head, and increased lumbar lordosis  PALPATION: Slight TTP to bilateral lumbar paraspinals  LUMBAR ROM:   AROM eval  Flexion 75%  Extension 50%  Right lateral flexion   Left lateral flexion   Right rotation   Left rotation    (Blank rows = not tested)  LOWER EXTREMITY MMT:    MMT Right eval Left eval  Hip flexion 3+/5 3+/5  Hip extension    Hip abduction 3+/5 3+/5  Hip adduction    Hip internal rotation    Hip external rotation    Knee flexion 5/5 5/5  Knee extension 5/5 5/5  Ankle dorsiflexion    Ankle plantarflexion    Ankle inversion    Ankle eversion     (Blank rows = not tested)  LUMBAR SPECIAL TESTS:  Straight leg raise test: Negative and Slump test: Positive  FUNCTIONAL TESTS:  Five Time Sit to Stand: 36 seconds with  UE  GAIT: Distance walked: 35ft Assistive device utilized: None Level of assistance: Complete Independence Comments: trunk flexed, decreased gait speed  TREATMENT: [facility] Adult PT Treatment:                                                DATE: 01/16/24 Therapeutic Activity: Nustep L5 UE/LE x 5 minutes for functional activity tolerance STS 2x10 - low table with UE Seated repeated flexion with physioball 2x10 Therapeutic Exercise: Seated sciatic nerve glide x 15 LTR x 10 Supine PPT x 10 - 5" hold Supine PPT with ball 2x10 Supine pilates SLR 2x10 each Hooklying clamshell 2x15 blue band Seated repeated flexion with physioball 2x10 Modalites: MHP to lumbar spine during supine exercises  PATIENT EDUCATION:  Education details: continue HEP Person educated: Patient Education method: Explanation, Demonstration, and Handouts Education comprehension: verbalized understanding and returned demonstration  HOME EXERCISE PROGRAM: Access Code: [email or web address] URL: [email or web address] Date: 12/25/2023 Prepared by: Edwinna Areola  Exercises - Seated Sciatic Tensioner  - 1 x daily - 7 x weekly - 2 sets - 15 reps - Supine Posterior Pelvic Tilt  - 1 x daily - 7 x weekly - 2 sets - 10 reps - 5 sec hold - Supine Bridge  - 1 x daily - 7 x weekly - 2 sets - 10 reps - Hooklying Clamshell with Resistance  - 1 x daily - 7 x weekly - 2-3 sets - 15 reps - blue band hold - Standing Lumbar Extension with Counter  - 1 x daily - 7 x weekly - 2 sets - 10 reps Added Single knee to chest    ASSESSMENT:  CLINICAL IMPRESSION: Pt was able to complete all prescribed exercises with no adverse effect. Exercises today focused on continued core and proximal hip strengthening as well as exercises with purpose on improving functional mobility and activity tolerance. Continues to have weakness and limited mobility, but does benefit from therapy. Will continue to progress as able per POC.   EVAL: Patient is a  68 y.o. F who was seen today for physical therapy evaluation and treatment for chronic LBP. Physical findings are consistent with MD impression as pt demonstrates core and proximal hip weakness.  ODI demonstrates severe disability in performance of home ADLs and community activities. Pt would benefit from skilled PT services working on improving core and hip strength in order to decrease pain and improve comfort.   OBJECTIVE IMPAIRMENTS: decreased activity tolerance, decreased balance, decreased mobility, difficulty walking, decreased ROM, decreased strength, postural dysfunction, and pain   ACTIVITY LIMITATIONS: carrying, lifting, sitting, standing, squatting, stairs, transfers, and locomotion level  PARTICIPATION LIMITATIONS: driving, shopping, community activity, occupation, and yard work  PERSONAL FACTORS: Time since onset of injury/illness/exacerbation and 1-2 comorbidities: HTN, R TKA  are also affecting patient's functional outcome.   REHAB POTENTIAL: Good  CLINICAL DECISION MAKING: Stable/uncomplicated  EVALUATION COMPLEXITY: Low   GOALS: Goals reviewed with patient? No  SHORT TERM GOALS: Target date: 01/15/2024   Pt will be compliant and knowledgeable with initial HEP for improved comfort and carryover Baseline: initial HEP given  Goal status: INITIAL  2.  Pt will self report low back pain no greater than 5/10 for improved comfort and functional ability Baseline: 8/10 at worst Goal status: INITIAL   LONG TERM GOALS: Target date: 02/19/2024   Pt will be decrease ODI disability score to  no greater than 40% (20/50) as proxy for functional improvement Baseline: 54% disability (27/50) Goal status: INITIAL  2.  Pt will self report low back pain no greater than 1-2/10 for improved comfort and functional ability Baseline: 8/10 at worst Goal status: INITIAL   3.  Pt will decrease Five Time Sit to Stand time to no less than 22 seconds for improved balance, strength, and  functional mobility Baseline: 36 seconds with UE Goal status: INITIAL    4.  Pt will improve bilateral LE MMT to no less than 4/5 for improved functional mobility and decreased back pain Baseline:  Goal status: INITIAL  PLAN:  PT FREQUENCY: 2x/week  PT DURATION: 8 weeks  PLANNED INTERVENTIONS: 97164- PT Re-evaluation, 97110-Therapeutic exercises, 97530- Therapeutic activity, [id number]- Neuromuscular re-education, 97535- Self Care, 09811- Manual therapy, [id number]- Gait training, [id number]- Electrical stimulation (unattended), [id number]- Electrical stimulation (manual), Dry Needling, Cryotherapy, and Moist heat.  PLAN FOR NEXT SESSION: assess HEP response, neutral spine core and hip strengthening   Eloy End PT  01/16/24 8:43 AM

## 2024-01-16 ENCOUNTER — Ambulatory Visit

## 2024-01-16 ENCOUNTER — Other Ambulatory Visit: Payer: Self-pay

## 2024-01-16 DIAGNOSIS — M5459 Other low back pain: Secondary | ICD-10-CM

## 2024-01-16 DIAGNOSIS — M6281 Muscle weakness (generalized): Secondary | ICD-10-CM

## 2024-01-19 ENCOUNTER — Ambulatory Visit (INDEPENDENT_AMBULATORY_CARE_PROVIDER_SITE_OTHER): Admitting: Family Medicine

## 2024-01-19 ENCOUNTER — Ambulatory Visit: Payer: Self-pay

## 2024-01-19 NOTE — Telephone Encounter (Signed)
  Chief Complaint: shortness of breath Symptoms: sob, weakness in legs, dizziness, frequent urination Frequency: worsening over the last 2 weeks Pertinent Negatives: Patient denies fever, chest pain, numbness, LOC Disposition: [] ED /[] Urgent Care (no appt availability in office) / [x] Appointment(In office/virtual)/ []  [facility]/ [] Home Care/ [] Refused Recommended Disposition /[]  Mobile Bus/ []  Follow-up with PCP Additional Notes: Patient reports she was seen at the beginning of the month for ongoing back pain, ongoing shortness of breath, and ongoing fatigue. Patient reports she is still experiencing these symptoms and is now also having intermittent mild dizziness and frequent urination. Patient reports she feels like this is all related to her issues with her back and would like further evaluation. Per protocol, attempted to schedule pt appt with PCP. Patient reports the soonest she is able to come in to be seen is this Thursday, so appt scheduled 01/22/24. Patient advised to call back with worsening or new symptoms. Patient verbalized understanding.    Copied from CRM [id number]. Topic: Clinical - Red Word Triage >> Jan 19, 2024  3:42 PM Pierre Bali B wrote: [facility] that prompted transfer to Nurse Triage: shortness of breath Reason for Disposition  [1] MODERATE longstanding difficulty breathing (e.g., speaks in phrases, SOB even at rest, pulse 100-120) AND [2] SAME as normal  Answer Assessment - Initial Assessment Questions 1. RESPIRATORY STATUS: "Describe your breathing?" (e.g., wheezing, shortness of breath, unable to speak, severe coughing)      Shortness of breath 2. ONSET: "When did this breathing problem begin?"      This week, worsening 3. PATTERN "Does the difficult breathing come and go, or has it been constant since it started?"      constant 4. SEVERITY: "How bad is your breathing?" (e.g., mild, moderate, severe)    - MILD: No SOB at rest, mild SOB with  walking, speaks normally in sentences, can lie down, no retractions, pulse < 100.    - MODERATE: SOB at rest, SOB with minimal exertion and prefers to sit, cannot lie down flat, speaks in phrases, mild retractions, audible wheezing, pulse 100-120.    - SEVERE: Very SOB at rest, speaks in single words, struggling to breathe, sitting hunched forward, retractions, pulse > 120      moderate 5. RECURRENT SYMPTOM: "Have you had difficulty breathing before?" If Yes, ask: "When was the last time?" and "What happened that time?"      none 6. CARDIAC HISTORY: "Do you have any history of heart disease?" (e.g., heart attack, angina, bypass surgery, angioplasty)      HTN 7. LUNG HISTORY: "Do you have any history of lung disease?"  (e.g., pulmonary embolus, asthma, emphysema)     denies 8. CAUSE: "What do you think is causing the breathing problem?"      Unsure, 9. OTHER SYMPTOMS: "Do you have any other symptoms? (e.g., dizziness, runny nose, cough, chest pain, fever)     Weakness in legs, fatigue, dizziness 10. O2 SATURATION MONITOR:  "Do you use an oxygen saturation monitor (pulse oximeter) at home?" If Yes, ask: "What is your reading (oxygen level) today?" "What is your usual oxygen saturation reading?" (e.g., 95%)       unsure  Protocols used: Breathing Difficulty-A-AH

## 2024-01-20 ENCOUNTER — Ambulatory Visit

## 2024-01-20 MED ORDER — BETAMETHASONE DIPROPIONATE 0.05 % EX CREA: TOPICAL_CREAM | Freq: Two times a day (BID) | CUTANEOUS | 0 refills | Status: DC

## 2024-01-22 ENCOUNTER — Ambulatory Visit

## 2024-01-22 ENCOUNTER — Ambulatory Visit (INDEPENDENT_AMBULATORY_CARE_PROVIDER_SITE_OTHER): Admitting: Family Medicine

## 2024-01-22 ENCOUNTER — Telehealth: Payer: Self-pay | Admitting: Family Medicine

## 2024-01-22 ENCOUNTER — Encounter: Payer: Self-pay | Admitting: Family Medicine

## 2024-01-22 VITALS — BP 120/76 | HR 90 | Temp 97.5°F | Wt 200.4 lb

## 2024-01-22 DIAGNOSIS — R0609 Other forms of dyspnea: Secondary | ICD-10-CM

## 2024-01-22 DIAGNOSIS — Z8781 Personal history of (healed) traumatic fracture: Secondary | ICD-10-CM | POA: Diagnosis not present

## 2024-01-22 DIAGNOSIS — G8929 Other chronic pain: Secondary | ICD-10-CM | POA: Diagnosis not present

## 2024-01-22 DIAGNOSIS — M545 Low back pain, unspecified: Secondary | ICD-10-CM | POA: Diagnosis not present

## 2024-01-22 DIAGNOSIS — M5416 Radiculopathy, lumbar region: Secondary | ICD-10-CM

## 2024-01-22 NOTE — Patient Instructions (Signed)
 An order was placed for chest x-ray and x-ray of your lower spine.  You will suggest further recommendations based on the results of the imaging.

## 2024-01-22 NOTE — Telephone Encounter (Signed)
 Handicap Placard form to be filled out--placed in dr's folder.  Please mail to pt once form is complete.

## 2024-01-22 NOTE — Progress Notes (Signed)
 Established Patient Office Visit   Subjective  Patient ID: Tiffany Montes, female    DOB: [birth year]  Age: 68 y.o. MRN: [id number]  Chief Complaint  Patient presents with   Back Pain   Shortness of Breath    Since early Monday morning    [name] followed by Dr. Darren Em and seen for ongoing concerns.  Pt with SOB with exertion x 4 days.  Has a dry intermittent cough.  Also with chronic low back pain, better today. Tried heat, ice.  In PT. Endorses weakness in legs at times.    Patient Active Problem List   Diagnosis Date Noted   Depression 01/02/2021   Metabolic syndrome 01/02/2021   Pes anserine bursitis 01/03/2020   Degenerative joint disease of knee, right 01/03/2020   Morbid obesity with BMI of 40.0-44.9, adult (HCC) 02/11/2019   Port-A-Cath in place 10/30/2018   Malignant neoplasm of upper-inner quadrant of left breast in female, estrogen receptor positive (HCC) 08/11/2018   Prediabetes 02/24/2018   Varicose veins of legs 06/03/2017   Mixed hyperlipidemia 01/08/2010   Attention deficit disorder 04/14/2009   Hypothyroidism 04/03/2009   Major depressive disorder, recurrent episode (HCC) 04/03/2009   Essential hypertension 04/03/2009   Past Medical History:  Diagnosis Date   ADHD    Anxiety    Attention deficit disorder    Back pain    Breast cancer (HCC) 2019   Left Breast Cancer   Depression    Gallbladder problem    Gallstones 09/05/2011   Hyperlipidemia    Hypertension    Hypothyroidism    Joint pain    Migraines    Neuropathy    Personal history of chemotherapy 2019   Left Breast Cancer   Personal history of radiation therapy 2019   Left Breast Cancer   PONV (postoperative nausea and vomiting)    diffficulty waking up   Spinal stenosis    Thyroid disease    hypothyroid   Past Surgical History:  Procedure Laterality Date   ABDOMINAL HYSTERECTOMY  2003   BREAST BIOPSY Left 2014   benign   BREAST LUMPECTOMY Left 09/07/2018    BREAST LUMPECTOMY WITH RADIOACTIVE SEED AND SENTINEL LYMPH NODE BIOPSY Left 09/07/2018   Procedure: LEFT BREAST LUMPECTOMY WITH RADIOACTIVE SEED AND AXILLARY SENTINEL LYMPH NODE BIOPSY,INJECT BLUE DYE LEFT BREAST;  Surgeon: Boyce Byes, MD;  Location: [facility] OR;  Service: General;  Laterality: Left;   CATARACT EXTRACTION Bilateral 02/2005   CHOLECYSTECTOMY  09/26/2011   Procedure: LAPAROSCOPIC CHOLECYSTECTOMY WITH INTRAOPERATIVE CHOLANGIOGRAM;  Surgeon: Darcella Earnest, MD;  Location: [facility] OR;  Service: General;  Laterality: N/A;   COLONOSCOPY     CYSTIC HYGROMA EXCISION     DENTAL SURGERY     skin graft from top of mouth to lower gums   GALLBLADDER SURGERY  11/2016   HYSTEROTOMY  05/2012   PORT-A-CATH REMOVAL Right 11/04/2019   Procedure: REMOVAL PORT-A-CATH;  Surgeon: Dareen Ebbing, MD;  Location: [facility];  Service: General;  Laterality: Right;   PORTACATH PLACEMENT N/A 09/07/2018   Procedure: INSERTION PORT-A-CATH WITH US ;  Surgeon: Boyce Byes, MD;  Location: [facility] OR;  Service: General;  Laterality: N/A;   REPLACEMENT TOTAL KNEE Right    Social History   Tobacco Use   Smoking status: Never   Smokeless tobacco: Never  Vaping Use   Vaping status: Never Used  Substance Use Topics   Alcohol use: Not Currently    Comment: 1-2  a week and reports not every week   Drug use: No   Family History  Problem Relation Age of Onset   Diabetes Father    Heart disease Father    Lung cancer Father    Hypertension Father    Hyperlipidemia Father    Thyroid disease Father    Alcoholism Father    Hypertension Mother    Thyroid disease Mother    Breast cancer Neg Hx    Ovarian cancer Neg Hx    Allergies  Allergen Reactions   Morphine Sulfate Nausea And Vomiting and Rash    GI upset      ROS Negative unless stated above    Objective:     BP 120/76 (BP Location: Right Arm, Patient Position: Sitting, Cuff Size: Large)   Pulse 90   Temp (!) 97.5 F (36.4 C)  (Temporal)   Wt 200 lb 6.4 oz (90.9 kg)   SpO2 90%   BMI 36.65 kg/m   BP Readings from Last 3 Encounters:  01/22/24 120/76  01/06/24 139/74  12/19/23 132/80   Wt Readings from Last 3 Encounters:  01/22/24 200 lb 6.4 oz (90.9 kg)  01/06/24 203 lb (92.1 kg)  12/19/23 207 lb 8 oz (94.1 kg)    Physical Exam Constitutional:      General: She is not in acute distress.    Appearance: Normal appearance.  HENT:     Head: Normocephalic and atraumatic.     Nose: Nose normal.     Mouth/Throat:     Mouth: Mucous membranes are moist.  Cardiovascular:     Rate and Rhythm: Normal rate and regular rhythm.     Heart sounds: Normal heart sounds. No murmur heard.    No gallop.  Pulmonary:     Effort: Pulmonary effort is normal. No respiratory distress.     Breath sounds: Normal breath sounds. No wheezing, rhonchi or rales.     Comments: Occasional dry cough. Musculoskeletal:     Cervical back: Normal.     Thoracic back: Normal.     Lumbar back: Bony tenderness present.       Back:     Right lower leg: No edema.     Left lower leg: No edema.  Skin:    General: Skin is warm and dry.  Neurological:     Mental Status: She is alert and oriented to person, place, and time.      No results found for any visits on 01/22/24.    Assessment & Plan:  DOE (dyspnea on exertion) -     DG Chest 2 View  History of compression fracture of spine -     DG Lumbar Spine Complete; Future  Chronic bilateral low back pain, unspecified whether sciatica present -     DG Lumbar Spine Complete; Future  Lumbar radiculopathy -     DG Lumbar Spine Complete; Future   Acute DOE.  PO2 initially 90% on room air.  Concern for infection, CHF, also consider PE.Obtain 6-minute walk test, CXR.  Further recommendations based on results.  Given strict precautions.  X-ray lumbar spine: No acute osseous abnormality of lumbar spine.  With redemonstration of moderate anterior wedging deformity of L1 vertebral body,  grossly similar to prior study.  CXR: No active cardiopulmonary disease.  Return if symptoms worsen or fail to improve.   Deeann Saint, MD

## 2024-01-23 ENCOUNTER — Encounter: Payer: Self-pay | Admitting: Family Medicine

## 2024-01-26 ENCOUNTER — Ambulatory Visit

## 2024-01-26 DIAGNOSIS — R2689 Other abnormalities of gait and mobility: Secondary | ICD-10-CM

## 2024-01-26 DIAGNOSIS — M5459 Other low back pain: Secondary | ICD-10-CM | POA: Diagnosis not present

## 2024-01-26 DIAGNOSIS — M6281 Muscle weakness (generalized): Secondary | ICD-10-CM

## 2024-01-26 NOTE — Therapy (Signed)
 OUTPATIENT PHYSICAL THERAPY TREATMENT   Patient Name: Tiffany Montes MRN: [id number] DOB:Jan 02, 1956, 68 y.o., female Today's Date: 01/26/2024  END OF SESSION:  PT End of Session - 01/26/24 0837     Visit Number 5    Number of Visits 17    Date for PT Re-Evaluation 02/19/24    Authorization Type MCR    PT Start Time 0845    PT Stop Time 0927    PT Time Calculation (min) 42 min    Activity Tolerance Patient tolerated treatment well    Behavior During Therapy [facility] for tasks assessed/performed                Past Medical History:  Diagnosis Date   ADHD    Anxiety    Attention deficit disorder    Back pain    Breast cancer (HCC) 2019   Left Breast Cancer   Depression    Gallbladder problem    Gallstones 09/05/2011   Hyperlipidemia    Hypertension    Hypothyroidism    Joint pain    Migraines    Neuropathy    Personal history of chemotherapy 2019   Left Breast Cancer   Personal history of radiation therapy 2019   Left Breast Cancer   PONV (postoperative nausea and vomiting)    diffficulty waking up   Spinal stenosis    Thyroid  disease    hypothyroid   Past Surgical History:  Procedure Laterality Date   ABDOMINAL HYSTERECTOMY  2003   BREAST BIOPSY Left 2014   benign   BREAST LUMPECTOMY Left 09/07/2018   BREAST LUMPECTOMY WITH RADIOACTIVE SEED AND SENTINEL LYMPH NODE BIOPSY Left 09/07/2018   Procedure: LEFT BREAST LUMPECTOMY WITH RADIOACTIVE SEED AND AXILLARY SENTINEL LYMPH NODE BIOPSY,INJECT BLUE DYE LEFT BREAST;  Surgeon: Boyce Byes, MD;  Location: [facility] OR;  Service: General;  Laterality: Left;   CATARACT EXTRACTION Bilateral 02/2005   CHOLECYSTECTOMY  09/26/2011   Procedure: LAPAROSCOPIC CHOLECYSTECTOMY WITH INTRAOPERATIVE CHOLANGIOGRAM;  Surgeon: Darcella Earnest, MD;  Location: [facility] OR;  Service: General;  Laterality: N/A;   COLONOSCOPY     CYSTIC HYGROMA EXCISION     DENTAL SURGERY     skin graft from top of mouth to lower gums   GALLBLADDER  SURGERY  11/2016   HYSTEROTOMY  05/2012   PORT-A-CATH REMOVAL Right 11/04/2019   Procedure: REMOVAL PORT-A-CATH;  Surgeon: Dareen Ebbing, MD;  Location: [facility];  Service: General;  Laterality: Right;   PORTACATH PLACEMENT N/A 09/07/2018   Procedure: INSERTION PORT-A-CATH WITH US ;  Surgeon: Boyce Byes, MD;  Location: [facility] OR;  Service: General;  Laterality: N/A;   REPLACEMENT TOTAL KNEE Right    Patient Active Problem List   Diagnosis Date Noted   Depression 01/02/2021   Metabolic syndrome 01/02/2021   Pes anserine bursitis 01/03/2020   Degenerative joint disease of knee, right 01/03/2020   Morbid obesity with BMI of 40.0-44.9, adult (HCC) 02/11/2019   Port-A-Cath in place 10/30/2018   Malignant neoplasm of upper-inner quadrant of left breast in female, estrogen receptor positive (HCC) 08/11/2018   Prediabetes 02/24/2018   Varicose veins of legs 06/03/2017   Mixed hyperlipidemia 01/08/2010   Attention deficit disorder 04/14/2009   Hypothyroidism 04/03/2009   Major depressive disorder, recurrent episode (HCC) 04/03/2009   Essential hypertension 04/03/2009    PCP: Marquetta Sit, MD   REFERRING PROVIDER: Marquetta Sit, MD   REFERRING DIAG: M54.40 (ICD-10-CM) - Bilateral low back pain with sciatica, sciatica laterality unspecified,  unspecified chronicity   Rationale for Evaluation and Treatment: Rehabilitation  THERAPY DIAG:  Other low back pain  Muscle weakness (generalized)  Other abnormalities of gait and mobility  ONSET DATE: Chronic  SUBJECTIVE:                                                                                                                                                                                           SUBJECTIVE STATEMENT: Pt presents to PT with reports of decreased back pain. Had difficulty breathing last week but had clear chest x-ray.  EVAL: Pt presents to PT with reports of chronic LBP that occasionally  refers into bilateral hip. Denies N/T into LE except occasional foot numbness bilaterally. No trauma or MOI, notes forward flexion increases pain and lying supine helps. Also denies bowel/bladder changes or saddle anesthesia.   PERTINENT HISTORY:  HTN, R TKA  PAIN:  Are you having pain?  Yes: NPRS scale: 0/10 In sitting Worst: 8/10 Pain location: lower back, bilateral hip Pain description: sharp, tight Aggravating factors: morning, bending forward, sweeping Relieving factors: medication  PRECAUTIONS: None  RED FLAGS: None   WEIGHT BEARING RESTRICTIONS: No  FALLS:  Has patient fallen in last 6 months? Yes. Number of falls - mechanical fall while in the yard  LIVING ENVIRONMENT: Lives with: lives alone Lives in: House/apartment  OCCUPATION: Retired  PLOF: Independent  PATIENT GOALS: improve comfort with ADLs, decrease back pain  OBJECTIVE:  Note: Objective measures were completed at Evaluation unless otherwise noted.  DIAGNOSTIC FINDINGS:  N/A  PATIENT SURVEYS:  Modified Oswestry: 27/50 - 54% disability  COGNITION: Overall cognitive status: Within functional limits for tasks assessed     SENSATION: WFL  POSTURE: rounded shoulders, forward head, and increased lumbar lordosis  PALPATION: Slight TTP to bilateral lumbar paraspinals  LUMBAR ROM:   AROM eval  Flexion 75%  Extension 50%  Right lateral flexion   Left lateral flexion   Right rotation   Left rotation    (Blank rows = not tested)  LOWER EXTREMITY MMT:    MMT Right eval Left eval  Hip flexion 3+/5 3+/5  Hip extension    Hip abduction 3+/5 3+/5  Hip adduction    Hip internal rotation    Hip external rotation    Knee flexion 5/5 5/5  Knee extension 5/5 5/5  Ankle dorsiflexion    Ankle plantarflexion    Ankle inversion    Ankle eversion     (Blank rows = not tested)  LUMBAR SPECIAL TESTS:  Straight leg raise test: Negative and Slump test: Positive  FUNCTIONAL TESTS:  Five Time  Sit to Stand: 36  seconds with UE  GAIT: Distance walked: 102ft Assistive device utilized: None Level of assistance: Complete Independence Comments: trunk flexed, decreased gait speed  TREATMENT: [facility] Adult PT Treatment:                                                DATE: 01/26/24 Therapeutic Activity: Nustep L5 UE/LE x 5 minutes for functional activity tolerance STS 2x10 - low table with UE Seated repeated flexion with physioball 2x15 Therapeutic Exercise: Supine pilates SLR 2x10 each Pilates bridge 2x10 Hooklying clamshell 2x15 blue band Hooklying ball squeeze 2x10  Row 2x10 blue band Pallof press x 10 3# Vitals: 96-98% during treatment Modalities: Ice to lumbar spine during supine exercises  PATIENT EDUCATION:  Education details: continue HEP Person educated: Patient Education method: Explanation, Demonstration, and Handouts Education comprehension: verbalized understanding and returned demonstration  HOME EXERCISE PROGRAM: Access Code: [email or web address] URL: [email or web address] Date: 12/25/2023 Prepared by: Loral Roch  Exercises - Seated Sciatic Tensioner  - 1 x daily - 7 x weekly - 2 sets - 15 reps - Supine Posterior Pelvic Tilt  - 1 x daily - 7 x weekly - 2 sets - 10 reps - 5 sec hold - Supine Bridge  - 1 x daily - 7 x weekly - 2 sets - 10 reps - Hooklying Clamshell with Resistance  - 1 x daily - 7 x weekly - 2-3 sets - 15 reps - blue band hold - Standing Lumbar Extension with Counter  - 1 x daily - 7 x weekly - 2 sets - 10 reps Added Single knee to chest    ASSESSMENT:  CLINICAL IMPRESSION: Pt was able to complete all prescribed exercises with no adverse effect. Exercises today focused on continued core and proximal hip strengthening as well as exercises with purpose on improving functional mobility and activity tolerance. Monitored O2 saturation today with values all within normal range. Continues to have weakness and limited mobility, but does benefit  from therapy. Will continue to progress as able per POC.   EVAL: Patient is a 68 y.o. F who was seen today for physical therapy evaluation and treatment for chronic LBP. Physical findings are consistent with MD impression as pt demonstrates core and proximal hip weakness.  ODI demonstrates severe disability in performance of home ADLs and community activities. Pt would benefit from skilled PT services working on improving core and hip strength in order to decrease pain and improve comfort.   OBJECTIVE IMPAIRMENTS: decreased activity tolerance, decreased balance, decreased mobility, difficulty walking, decreased ROM, decreased strength, postural dysfunction, and pain   ACTIVITY LIMITATIONS: carrying, lifting, sitting, standing, squatting, stairs, transfers, and locomotion level  PARTICIPATION LIMITATIONS: driving, shopping, community activity, occupation, and yard work  PERSONAL FACTORS: Time since onset of injury/illness/exacerbation and 1-2 comorbidities: HTN, R TKA  are also affecting patient's functional outcome.   REHAB POTENTIAL: Good  CLINICAL DECISION MAKING: Stable/uncomplicated  EVALUATION COMPLEXITY: Low   GOALS: Goals reviewed with patient? No  SHORT TERM GOALS: Target date: 01/15/2024   Pt will be compliant and knowledgeable with initial HEP for improved comfort and carryover Baseline: initial HEP given  Goal status: INITIAL  2.  Pt will self report low back pain no greater than 5/10 for improved comfort and functional ability Baseline: 8/10 at worst Goal status: INITIAL   LONG TERM GOALS: Target date: 02/19/2024   Pt will  be decrease ODI disability score to no greater than 40% (20/50) as proxy for functional improvement Baseline: 54% disability (27/50) Goal status: INITIAL  2.  Pt will self report low back pain no greater than 1-2/10 for improved comfort and functional ability Baseline: 8/10 at worst Goal status: INITIAL   3.  Pt will decrease Five Time Sit to  Stand time to no less than 22 seconds for improved balance, strength, and functional mobility Baseline: 36 seconds with UE Goal status: INITIAL    4.  Pt will improve bilateral LE MMT to no less than 4/5 for improved functional mobility and decreased back pain Baseline:  Goal status: INITIAL  PLAN:  PT FREQUENCY: 2x/week  PT DURATION: 8 weeks  PLANNED INTERVENTIONS: [id number]- PT Re-evaluation, 97110-Therapeutic exercises, 97530- Therapeutic activity, [id number]- Neuromuscular re-education, 97535- Self Care, [id number]- Manual therapy, [id number]- Gait training, [id number]- Electrical stimulation (unattended), [id number]- Electrical stimulation (manual), Dry Needling, Cryotherapy, and Moist heat.  PLAN FOR NEXT SESSION: assess HEP response, neutral spine core and hip strengthening   Ivor Mars PT  01/26/24 9:48 AM

## 2024-01-26 NOTE — Telephone Encounter (Signed)
 I spoke with the patient and she reported she is having no Dyspnea at this time. Patient reported she is feeling better today. Patient reported she was seen by Dr. Arliss Lam on 04/17 and was having dyspnea with exertion at that time

## 2024-01-26 NOTE — Telephone Encounter (Signed)
 Noted.  Tiffany Covey MD [facility]

## 2024-01-28 ENCOUNTER — Ambulatory Visit

## 2024-01-28 DIAGNOSIS — M5459 Other low back pain: Secondary | ICD-10-CM

## 2024-01-28 DIAGNOSIS — R2689 Other abnormalities of gait and mobility: Secondary | ICD-10-CM

## 2024-01-28 DIAGNOSIS — M6281 Muscle weakness (generalized): Secondary | ICD-10-CM

## 2024-01-28 NOTE — Therapy (Signed)
 OUTPATIENT PHYSICAL THERAPY TREATMENT   Patient Name: Tiffany Montes MRN: [id number] DOB:1956/07/08, 68 y.o., female Today's Date: 01/28/2024  END OF SESSION:  PT End of Session - 01/28/24 0759     Visit Number 6    Number of Visits 17    Date for PT Re-Evaluation 02/19/24    Authorization Type MCR    PT Start Time 0800    PT Stop Time 0840    PT Time Calculation (min) 40 min    Activity Tolerance Patient tolerated treatment well    Behavior During Therapy [facility] for tasks assessed/performed                 Past Medical History:  Diagnosis Date   ADHD    Anxiety    Attention deficit disorder    Back pain    Breast cancer (HCC) 2019   Left Breast Cancer   Depression    Gallbladder problem    Gallstones 09/05/2011   Hyperlipidemia    Hypertension    Hypothyroidism    Joint pain    Migraines    Neuropathy    Personal history of chemotherapy 2019   Left Breast Cancer   Personal history of radiation therapy 2019   Left Breast Cancer   PONV (postoperative nausea and vomiting)    diffficulty waking up   Spinal stenosis    Thyroid  disease    hypothyroid   Past Surgical History:  Procedure Laterality Date   ABDOMINAL HYSTERECTOMY  2003   BREAST BIOPSY Left 2014   benign   BREAST LUMPECTOMY Left 09/07/2018   BREAST LUMPECTOMY WITH RADIOACTIVE SEED AND SENTINEL LYMPH NODE BIOPSY Left 09/07/2018   Procedure: LEFT BREAST LUMPECTOMY WITH RADIOACTIVE SEED AND AXILLARY SENTINEL LYMPH NODE BIOPSY,INJECT BLUE DYE LEFT BREAST;  Surgeon: Boyce Byes, MD;  Location: [facility] OR;  Service: General;  Laterality: Left;   CATARACT EXTRACTION Bilateral 02/2005   CHOLECYSTECTOMY  09/26/2011   Procedure: LAPAROSCOPIC CHOLECYSTECTOMY WITH INTRAOPERATIVE CHOLANGIOGRAM;  Surgeon: Darcella Earnest, MD;  Location: [facility] OR;  Service: General;  Laterality: N/A;   COLONOSCOPY     CYSTIC HYGROMA EXCISION     DENTAL SURGERY     skin graft from top of mouth to lower gums    GALLBLADDER SURGERY  11/2016   HYSTEROTOMY  05/2012   PORT-A-CATH REMOVAL Right 11/04/2019   Procedure: REMOVAL PORT-A-CATH;  Surgeon: Dareen Ebbing, MD;  Location: [facility];  Service: General;  Laterality: Right;   PORTACATH PLACEMENT N/A 09/07/2018   Procedure: INSERTION PORT-A-CATH WITH US ;  Surgeon: Boyce Byes, MD;  Location: [facility] OR;  Service: General;  Laterality: N/A;   REPLACEMENT TOTAL KNEE Right    Patient Active Problem List   Diagnosis Date Noted   Depression 01/02/2021   Metabolic syndrome 01/02/2021   Pes anserine bursitis 01/03/2020   Degenerative joint disease of knee, right 01/03/2020   Morbid obesity with BMI of 40.0-44.9, adult (HCC) 02/11/2019   Port-A-Cath in place 10/30/2018   Malignant neoplasm of upper-inner quadrant of left breast in female, estrogen receptor positive (HCC) 08/11/2018   Prediabetes 02/24/2018   Varicose veins of legs 06/03/2017   Mixed hyperlipidemia 01/08/2010   Attention deficit disorder 04/14/2009   Hypothyroidism 04/03/2009   Major depressive disorder, recurrent episode (HCC) 04/03/2009   Essential hypertension 04/03/2009    PCP: Marquetta Sit, MD   REFERRING PROVIDER: Marquetta Sit, MD   REFERRING DIAG: M54.40 (ICD-10-CM) - Bilateral low back pain with sciatica, sciatica laterality  unspecified, unspecified chronicity   Rationale for Evaluation and Treatment: Rehabilitation  THERAPY DIAG:  Other low back pain  Muscle weakness (generalized)  Other abnormalities of gait and mobility  ONSET DATE: Chronic  SUBJECTIVE:                                                                                                                                                                                           SUBJECTIVE STATEMENT: Pt presents to PT today with reports of R hip discomfort and lower back stiffness. Has been compliant with HEP.   EVAL: Pt presents to PT with reports of chronic LBP that  occasionally refers into bilateral hip. Denies N/T into LE except occasional foot numbness bilaterally. No trauma or MOI, notes forward flexion increases pain and lying supine helps. Also denies bowel/bladder changes or saddle anesthesia.   PERTINENT HISTORY:  HTN, R TKA  PAIN:  Are you having pain?  Yes: NPRS scale: 5/10 Worst: 8/10 Pain location: lower back, bilateral hip Pain description: sharp, tight Aggravating factors: morning, bending forward, sweeping Relieving factors: medication  PRECAUTIONS: None  RED FLAGS: None   WEIGHT BEARING RESTRICTIONS: No  FALLS:  Has patient fallen in last 6 months? Yes. Number of falls - mechanical fall while in the yard  LIVING ENVIRONMENT: Lives with: lives alone Lives in: House/apartment  OCCUPATION: Retired  PLOF: Independent  PATIENT GOALS: improve comfort with ADLs, decrease back pain  OBJECTIVE:  Note: Objective measures were completed at Evaluation unless otherwise noted.  DIAGNOSTIC FINDINGS:  N/A  PATIENT SURVEYS:  Modified Oswestry: 27/50 - 54% disability  COGNITION: Overall cognitive status: Within functional limits for tasks assessed     SENSATION: WFL  POSTURE: rounded shoulders, forward head, and increased lumbar lordosis  PALPATION: Slight TTP to bilateral lumbar paraspinals  LUMBAR ROM:   AROM eval  Flexion 75%  Extension 50%  Right lateral flexion   Left lateral flexion   Right rotation   Left rotation    (Blank rows = not tested)  LOWER EXTREMITY MMT:    MMT Right eval Left eval  Hip flexion 3+/5 3+/5  Hip extension    Hip abduction 3+/5 3+/5  Hip adduction    Hip internal rotation    Hip external rotation    Knee flexion 5/5 5/5  Knee extension 5/5 5/5  Ankle dorsiflexion    Ankle plantarflexion    Ankle inversion    Ankle eversion     (Blank rows = not tested)  LUMBAR SPECIAL TESTS:  Straight leg raise test: Negative and Slump test: Positive  FUNCTIONAL TESTS:  Five Time  Sit to Stand: 36  seconds with UE  GAIT: Distance walked: 65ft Assistive device utilized: None Level of assistance: Complete Independence Comments: trunk flexed, decreased gait speed  TREATMENT: [facility] Adult PT Treatment:                                                DATE: 01/28/24 Therapeutic Activity: Nustep L5 UE/LE x 5 minutes for functional activity tolerance STS 2x10 - low table with UE Seated repeated flexion with physioball 2x15 Therapeutic Exercise: Supine pilates SLR 2x10 each Pilates bridge 2x10 Hooklying clamshell 3x15 black band Supine PPT x 10 - 5" hold Supine PPT with ball 2x10  LAQ 2x10 3#  Modalities: Ice to lumbar spine during supine exercises  PATIENT EDUCATION:  Education details: continue HEP Person educated: Patient Education method: Explanation, Demonstration, and Handouts Education comprehension: verbalized understanding and returned demonstration  HOME EXERCISE PROGRAM: Access Code: [email or web address] URL: [email or web address] Date: 01/28/2024 Prepared by: Loral Roch  Exercises - Seated Sciatic Tensioner  - 1 x daily - 7 x weekly - 2 sets - 15 reps - Supine Posterior Pelvic Tilt  - 1 x daily - 7 x weekly - 2 sets - 10 reps - 5 sec hold - Supine Bridge  - 1 x daily - 7 x weekly - 2 sets - 10 reps - Hooklying Clamshell with Resistance  - 1 x daily - 7 x weekly - 2-3 sets - 15 reps - blue band hold - Standing Lumbar Extension with Counter  - 1 x daily - 7 x weekly - 2 sets - 10 reps - Hooklying Single Knee to Chest  - 1 x daily - 7 x weekly - 1 sets - 3 reps - 20 hold - Sit to Stand  - 1 x daily - 7 x weekly - 2 sets - 10 reps   ASSESSMENT:  CLINICAL IMPRESSION: Pt was able to complete all prescribed exercises with no adverse effect. Exercises today focused on continued core and proximal hip strengthening as well as exercises with purpose on improving functional mobility and activity tolerance. Continues to have weakness and limited mobility,  but does benefit from therapy. HEP updated for transfer work and LE strength. Will continue to progress as able per POC.   EVAL: Patient is a 68 y.o. F who was seen today for physical therapy evaluation and treatment for chronic LBP. Physical findings are consistent with MD impression as pt demonstrates core and proximal hip weakness.  ODI demonstrates severe disability in performance of home ADLs and community activities. Pt would benefit from skilled PT services working on improving core and hip strength in order to decrease pain and improve comfort.   OBJECTIVE IMPAIRMENTS: decreased activity tolerance, decreased balance, decreased mobility, difficulty walking, decreased ROM, decreased strength, postural dysfunction, and pain   ACTIVITY LIMITATIONS: carrying, lifting, sitting, standing, squatting, stairs, transfers, and locomotion level  PARTICIPATION LIMITATIONS: driving, shopping, community activity, occupation, and yard work  PERSONAL FACTORS: Time since onset of injury/illness/exacerbation and 1-2 comorbidities: HTN, R TKA  are also affecting patient's functional outcome.   REHAB POTENTIAL: Good  CLINICAL DECISION MAKING: Stable/uncomplicated  EVALUATION COMPLEXITY: Low   GOALS: Goals reviewed with patient? No  SHORT TERM GOALS: Target date: 01/15/2024   Pt will be compliant and knowledgeable with initial HEP for improved comfort and carryover Baseline: initial HEP given  Goal status: INITIAL  2.  Pt will  self report low back pain no greater than 5/10 for improved comfort and functional ability Baseline: 8/10 at worst Goal status: INITIAL   LONG TERM GOALS: Target date: 02/19/2024   Pt will be decrease ODI disability score to no greater than 40% (20/50) as proxy for functional improvement Baseline: 54% disability (27/50) Goal status: INITIAL  2.  Pt will self report low back pain no greater than 1-2/10 for improved comfort and functional ability Baseline: 8/10 at  worst Goal status: INITIAL   3.  Pt will decrease Five Time Sit to Stand time to no less than 22 seconds for improved balance, strength, and functional mobility Baseline: 36 seconds with UE Goal status: INITIAL    4.  Pt will improve bilateral LE MMT to no less than 4/5 for improved functional mobility and decreased back pain Baseline:  Goal status: INITIAL  PLAN:  PT FREQUENCY: 2x/week  PT DURATION: 8 weeks  PLANNED INTERVENTIONS: 97164- PT Re-evaluation, 97110-Therapeutic exercises, 97530- Therapeutic activity, [id number]- Neuromuscular re-education, 97535- Self Care, [id number]- Manual therapy, [id number]- Gait training, [id number]- Electrical stimulation (unattended), [id number]- Electrical stimulation (manual), Dry Needling, Cryotherapy, and Moist heat.  PLAN FOR NEXT SESSION: assess HEP response, neutral spine core and hip strengthening   Ivor Mars PT  01/28/24 8:43 AM

## 2024-02-05 ENCOUNTER — Encounter: Payer: Self-pay | Admitting: Physical Therapy

## 2024-02-05 ENCOUNTER — Ambulatory Visit: Attending: Family Medicine | Admitting: Physical Therapy

## 2024-02-05 DIAGNOSIS — M6281 Muscle weakness (generalized): Secondary | ICD-10-CM | POA: Insufficient documentation

## 2024-02-05 DIAGNOSIS — M5459 Other low back pain: Secondary | ICD-10-CM | POA: Diagnosis present

## 2024-02-05 DIAGNOSIS — R2689 Other abnormalities of gait and mobility: Secondary | ICD-10-CM | POA: Diagnosis present

## 2024-02-05 NOTE — Therapy (Signed)
 OUTPATIENT PHYSICAL THERAPY TREATMENT   Patient Name: Tiffany Montes MRN: [id number] DOB:1956-02-18, 68 y.o., female Today's Date: 02/05/2024  END OF SESSION:  PT End of Session - 02/05/24 0851     Visit Number 7    Number of Visits 17    Date for PT Re-Evaluation 02/19/24    Authorization Type MCR    PT Start Time 0850    PT Stop Time 0930    PT Time Calculation (min) 40 min                 Past Medical History:  Diagnosis Date   ADHD    Anxiety    Attention deficit disorder    Back pain    Breast cancer (HCC) 2019   Left Breast Cancer   Depression    Gallbladder problem    Gallstones 09/05/2011   Hyperlipidemia    Hypertension    Hypothyroidism    Joint pain    Migraines    Neuropathy    Personal history of chemotherapy 2019   Left Breast Cancer   Personal history of radiation therapy 2019   Left Breast Cancer   PONV (postoperative nausea and vomiting)    diffficulty waking up   Spinal stenosis    Thyroid  disease    hypothyroid   Past Surgical History:  Procedure Laterality Date   ABDOMINAL HYSTERECTOMY  2003   BREAST BIOPSY Left 2014   benign   BREAST LUMPECTOMY Left 09/07/2018   BREAST LUMPECTOMY WITH RADIOACTIVE SEED AND SENTINEL LYMPH NODE BIOPSY Left 09/07/2018   Procedure: LEFT BREAST LUMPECTOMY WITH RADIOACTIVE SEED AND AXILLARY SENTINEL LYMPH NODE BIOPSY,INJECT BLUE DYE LEFT BREAST;  Surgeon: Boyce Byes, MD;  Location: [facility] OR;  Service: General;  Laterality: Left;   CATARACT EXTRACTION Bilateral 02/2005   CHOLECYSTECTOMY  09/26/2011   Procedure: LAPAROSCOPIC CHOLECYSTECTOMY WITH INTRAOPERATIVE CHOLANGIOGRAM;  Surgeon: Darcella Earnest, MD;  Location: [facility] OR;  Service: General;  Laterality: N/A;   COLONOSCOPY     CYSTIC HYGROMA EXCISION     DENTAL SURGERY     skin graft from top of mouth to lower gums   GALLBLADDER SURGERY  11/2016   HYSTEROTOMY  05/2012   PORT-A-CATH REMOVAL Right 11/04/2019   Procedure: REMOVAL PORT-A-CATH;   Surgeon: Dareen Ebbing, MD;  Location: [facility];  Service: General;  Laterality: Right;   PORTACATH PLACEMENT N/A 09/07/2018   Procedure: INSERTION PORT-A-CATH WITH US ;  Surgeon: Boyce Byes, MD;  Location: [facility] OR;  Service: General;  Laterality: N/A;   REPLACEMENT TOTAL KNEE Right    Patient Active Problem List   Diagnosis Date Noted   Depression 01/02/2021   Metabolic syndrome 01/02/2021   Pes anserine bursitis 01/03/2020   Degenerative joint disease of knee, right 01/03/2020   Morbid obesity with BMI of 40.0-44.9, adult (HCC) 02/11/2019   Port-A-Cath in place 10/30/2018   Malignant neoplasm of upper-inner quadrant of left breast in female, estrogen receptor positive (HCC) 08/11/2018   Prediabetes 02/24/2018   Varicose veins of legs 06/03/2017   Mixed hyperlipidemia 01/08/2010   Attention deficit disorder 04/14/2009   Hypothyroidism 04/03/2009   Major depressive disorder, recurrent episode (HCC) 04/03/2009   Essential hypertension 04/03/2009    PCP: Marquetta Sit, MD   REFERRING PROVIDER: Marquetta Sit, MD   REFERRING DIAG: M54.40 (ICD-10-CM) - Bilateral low back pain with sciatica, sciatica laterality unspecified, unspecified chronicity   Rationale for Evaluation and Treatment: Rehabilitation  THERAPY DIAG:  Other low back pain  Muscle weakness (generalized)  Other abnormalities of gait and mobility  ONSET DATE: Chronic  SUBJECTIVE:                                                                                                                                                                                           SUBJECTIVE STATEMENT: Pt reports overall her pain has improved. She has some days with less pain. Mornings are hard and she did not get her her hot shower this morning which usually helps.     EVAL: Pt presents to PT with reports of chronic LBP that occasionally refers into bilateral hip. Denies N/T into LE except occasional  foot numbness bilaterally. No trauma or MOI, notes forward flexion increases pain and lying supine helps. Also denies bowel/bladder changes or saddle anesthesia.   PERTINENT HISTORY:  HTN, R TKA  PAIN:  Are you having pain?  Yes: NPRS scale: 7/10 Worst: 8/10 Pain location: lower back, bilateral hip Pain description: sharp, tight Aggravating factors: morning, bending forward, sweeping Relieving factors: medication  PRECAUTIONS: None  RED FLAGS: None   WEIGHT BEARING RESTRICTIONS: No  FALLS:  Has patient fallen in last 6 months? Yes. Number of falls - mechanical fall while in the yard  LIVING ENVIRONMENT: Lives with: lives alone Lives in: House/apartment  OCCUPATION: Retired  PLOF: Independent  PATIENT GOALS: improve comfort with ADLs, decrease back pain  OBJECTIVE:  Note: Objective measures were completed at Evaluation unless otherwise noted.  DIAGNOSTIC FINDINGS:  N/A  PATIENT SURVEYS:  Modified Oswestry: 27/50 - 54% disability  COGNITION: Overall cognitive status: Within functional limits for tasks assessed     SENSATION: WFL  POSTURE: rounded shoulders, forward head, and increased lumbar lordosis  PALPATION: Slight TTP to bilateral lumbar paraspinals  LUMBAR ROM:   AROM eval  Flexion 75%  Extension 50%  Right lateral flexion   Left lateral flexion   Right rotation   Left rotation    (Blank rows = not tested)  LOWER EXTREMITY MMT:    MMT Right eval Left eval  Hip flexion 3+/5 3+/5  Hip extension    Hip abduction 3+/5 3+/5  Hip adduction    Hip internal rotation    Hip external rotation    Knee flexion 5/5 5/5  Knee extension 5/5 5/5  Ankle dorsiflexion    Ankle plantarflexion    Ankle inversion    Ankle eversion     (Blank rows = not tested)  LUMBAR SPECIAL TESTS:  Straight leg raise test: Negative and Slump test: Positive  FUNCTIONAL TESTS:  Five Time Sit to Stand: 36 seconds with UE  GAIT: Distance walked: 58ft  Assistive  device utilized: None Level of assistance: Complete Independence Comments: trunk flexed, decreased gait speed  TREATMENT: [facility] Adult PT Treatment:                                                DATE: 02/05/24 Therapeutic Activity: Nustep L5 UE/LE x 5 minutes for functional activity tolerance STS 2x10 - low table with out UE Seated repeated flexion with physioball 1 x 15  SKTC  Therapeutic Exercise: Supine pilates SLR 2x10 each PPT to Bridge 10 x 2  Hooklying clamshell 3x15 black band Supine PPT x 10 - 5" hold Supine PPT with ball 2x10  LAQ 2x10 3#  Modalities: Ice to lumbar spine during supine exercises   [facility] Adult PT Treatment:                                                DATE: 01/28/24 Therapeutic Activity: Nustep L5 UE/LE x 5 minutes for functional activity tolerance STS 2x10 - low table with UE Seated repeated flexion with physioball 2x15 Therapeutic Exercise: Supine pilates SLR 2x10 each Pilates bridge 2x10 Hooklying clamshell 3x15 black band Supine PPT x 10 - 5" hold Supine PPT with ball 2x10  Standing hip abduction 10 x 2  Standing heel raises x 15 Gastroc stretch  Modalities: Ice to lumbar spine during supine exercises  PATIENT EDUCATION:  Education details: continue HEP Person educated: Patient Education method: Explanation, Demonstration, and Handouts Education comprehension: verbalized understanding and returned demonstration  HOME EXERCISE PROGRAM: Access Code: [email or web address] URL: [email or web address] Date: 01/28/2024 Prepared by: Loral Roch  Exercises - Seated Sciatic Tensioner  - 1 x daily - 7 x weekly - 2 sets - 15 reps - Supine Posterior Pelvic Tilt  - 1 x daily - 7 x weekly - 2 sets - 10 reps - 5 sec hold - Supine Bridge  - 1 x daily - 7 x weekly - 2 sets - 10 reps - Hooklying Clamshell with Resistance  - 1 x daily - 7 x weekly - 2-3 sets - 15 reps - blue band hold - Standing Lumbar Extension with Counter  - 1 x daily - 7 x weekly  - 2 sets - 10 reps - Hooklying Single Knee to Chest  - 1 x daily - 7 x weekly - 1 sets - 3 reps - 20 hold - Sit to Stand  - 1 x daily - 7 x weekly - 2 sets - 10 reps - Standing Hip Abduction with Counter Support  - 1 x daily - 7 x weekly - 2 sets - 10 reps - Standing Heel Raise with Support  - 1 x daily - 7 x weekly - 2 sets - 10 reps - Gastroc Stretch on Wall  - 1 x daily - 7 x weekly - 1 sets - 3 reps - 20 hold   ASSESSMENT:  CLINICAL IMPRESSION: Pt reports continued pain. She did respond well to closed chain hip and calf strength and requested these be added to her HEP. Reviewed lumbar extension in standing which she also reported a good relief of pain. Able to complete STS without UE today. Discussed benefit of long term HEP to improve weakness and mobility. Will continue to progress as  able per POC.   EVAL: Patient is a 68 y.o. F who was seen today for physical therapy evaluation and treatment for chronic LBP. Physical findings are consistent with MD impression as pt demonstrates core and proximal hip weakness.  ODI demonstrates severe disability in performance of home ADLs and community activities. Pt would benefit from skilled PT services working on improving core and hip strength in order to decrease pain and improve comfort.   OBJECTIVE IMPAIRMENTS: decreased activity tolerance, decreased balance, decreased mobility, difficulty walking, decreased ROM, decreased strength, postural dysfunction, and pain   ACTIVITY LIMITATIONS: carrying, lifting, sitting, standing, squatting, stairs, transfers, and locomotion level  PARTICIPATION LIMITATIONS: driving, shopping, community activity, occupation, and yard work  PERSONAL FACTORS: Time since onset of injury/illness/exacerbation and 1-2 comorbidities: HTN, R TKA  are also affecting patient's functional outcome.   REHAB POTENTIAL: Good  CLINICAL DECISION MAKING: Stable/uncomplicated  EVALUATION COMPLEXITY: Low   GOALS: Goals reviewed  with patient? No  SHORT TERM GOALS: Target date: 01/15/2024   Pt will be compliant and knowledgeable with initial HEP for improved comfort and carryover Baseline: initial HEP given  Goal status: INITIAL  2.  Pt will self report low back pain no greater than 5/10 for improved comfort and functional ability Baseline: 8/10 at worst Goal status: INITIAL   LONG TERM GOALS: Target date: 02/19/2024   Pt will be decrease ODI disability score to no greater than 40% (20/50) as proxy for functional improvement Baseline: 54% disability (27/50) Goal status: INITIAL  2.  Pt will self report low back pain no greater than 1-2/10 for improved comfort and functional ability Baseline: 8/10 at worst Goal status: INITIAL   3.  Pt will decrease Five Time Sit to Stand time to no less than 22 seconds for improved balance, strength, and functional mobility Baseline: 36 seconds with UE Goal status: INITIAL    4.  Pt will improve bilateral LE MMT to no less than 4/5 for improved functional mobility and decreased back pain Baseline:  Goal status: INITIAL  PLAN:  PT FREQUENCY: 2x/week  PT DURATION: 8 weeks  PLANNED INTERVENTIONS: 97164- PT Re-evaluation, 97110-Therapeutic exercises, 97530- Therapeutic activity, [id number]- Neuromuscular re-education, 97535- Self Care, 16109- Manual therapy, [id number]- Gait training, U0454- Electrical stimulation (unattended), [id number]- Electrical stimulation (manual), Dry Needling, Cryotherapy, and Moist heat.  PLAN FOR NEXT SESSION: assess HEP response, neutral spine core and hip strengthening   Gasper Karst, PTA 02/05/24 12:58 PM Phone: [phone] Fax: [phone]

## 2024-02-08 DIAGNOSIS — Z0279 Encounter for issue of other medical certificate: Secondary | ICD-10-CM

## 2024-02-10 ENCOUNTER — Ambulatory Visit

## 2024-02-13 ENCOUNTER — Encounter: Payer: Self-pay | Admitting: Physical Therapy

## 2024-02-13 ENCOUNTER — Ambulatory Visit: Admitting: Physical Therapy

## 2024-02-13 DIAGNOSIS — M6281 Muscle weakness (generalized): Secondary | ICD-10-CM

## 2024-02-13 DIAGNOSIS — R2689 Other abnormalities of gait and mobility: Secondary | ICD-10-CM

## 2024-02-13 DIAGNOSIS — M5459 Other low back pain: Secondary | ICD-10-CM | POA: Diagnosis not present

## 2024-02-13 NOTE — Therapy (Signed)
 OUTPATIENT PHYSICAL THERAPY TREATMENT   Patient Name: Tiffany Montes MRN: [id number] DOB:05-01-1956, 68 y.o., female Today's Date: 02/13/2024  END OF SESSION:  PT End of Session - 02/13/24 0852     Visit Number 8    Number of Visits 17    Date for PT Re-Evaluation 02/19/24    Authorization Type MCR    PT Start Time 0850    PT Stop Time 0930    PT Time Calculation (min) 40 min                 Past Medical History:  Diagnosis Date   ADHD    Anxiety    Attention deficit disorder    Back pain    Breast cancer (HCC) 2019   Left Breast Cancer   Depression    Gallbladder problem    Gallstones 09/05/2011   Hyperlipidemia    Hypertension    Hypothyroidism    Joint pain    Migraines    Neuropathy    Personal history of chemotherapy 2019   Left Breast Cancer   Personal history of radiation therapy 2019   Left Breast Cancer   PONV (postoperative nausea and vomiting)    diffficulty waking up   Spinal stenosis    Thyroid  disease    hypothyroid   Past Surgical History:  Procedure Laterality Date   ABDOMINAL HYSTERECTOMY  2003   BREAST BIOPSY Left 2014   benign   BREAST LUMPECTOMY Left 09/07/2018   BREAST LUMPECTOMY WITH RADIOACTIVE SEED AND SENTINEL LYMPH NODE BIOPSY Left 09/07/2018   Procedure: LEFT BREAST LUMPECTOMY WITH RADIOACTIVE SEED AND AXILLARY SENTINEL LYMPH NODE BIOPSY,INJECT BLUE DYE LEFT BREAST;  Surgeon: Boyce Byes, MD;  Location: [facility] OR;  Service: General;  Laterality: Left;   CATARACT EXTRACTION Bilateral 02/2005   CHOLECYSTECTOMY  09/26/2011   Procedure: LAPAROSCOPIC CHOLECYSTECTOMY WITH INTRAOPERATIVE CHOLANGIOGRAM;  Surgeon: Darcella Earnest, MD;  Location: [facility] OR;  Service: General;  Laterality: N/A;   COLONOSCOPY     CYSTIC HYGROMA EXCISION     DENTAL SURGERY     skin graft from top of mouth to lower gums   GALLBLADDER SURGERY  11/2016   HYSTEROTOMY  05/2012   PORT-A-CATH REMOVAL Right 11/04/2019   Procedure: REMOVAL PORT-A-CATH;   Surgeon: Dareen Ebbing, MD;  Location: [facility];  Service: General;  Laterality: Right;   PORTACATH PLACEMENT N/A 09/07/2018   Procedure: INSERTION PORT-A-CATH WITH US ;  Surgeon: Boyce Byes, MD;  Location: [facility] OR;  Service: General;  Laterality: N/A;   REPLACEMENT TOTAL KNEE Right    Patient Active Problem List   Diagnosis Date Noted   Depression 01/02/2021   Metabolic syndrome 01/02/2021   Pes anserine bursitis 01/03/2020   Degenerative joint disease of knee, right 01/03/2020   Morbid obesity with BMI of 40.0-44.9, adult (HCC) 02/11/2019   Port-A-Cath in place 10/30/2018   Malignant neoplasm of upper-inner quadrant of left breast in female, estrogen receptor positive (HCC) 08/11/2018   Prediabetes 02/24/2018   Varicose veins of legs 06/03/2017   Mixed hyperlipidemia 01/08/2010   Attention deficit disorder 04/14/2009   Hypothyroidism 04/03/2009   Major depressive disorder, recurrent episode (HCC) 04/03/2009   Essential hypertension 04/03/2009    PCP: Marquetta Sit, MD   REFERRING PROVIDER: Marquetta Sit, MD   REFERRING DIAG: M54.40 (ICD-10-CM) - Bilateral low back pain with sciatica, sciatica laterality unspecified, unspecified chronicity   Rationale for Evaluation and Treatment: Rehabilitation  THERAPY DIAG:  Other low back pain  Muscle weakness (generalized)  Other abnormalities of gait and mobility  ONSET DATE: Chronic  SUBJECTIVE:                                                                                                                                                                                           SUBJECTIVE STATEMENT: Pt reports she is getting better.     EVAL: Pt presents to PT with reports of chronic LBP that occasionally refers into bilateral hip. Denies N/T into LE except occasional foot numbness bilaterally. No trauma or MOI, notes forward flexion increases pain and lying supine helps. Also denies bowel/bladder  changes or saddle anesthesia.   PERTINENT HISTORY:  HTN, R TKA  PAIN:  Are you having pain?  Yes: NPRS scale: 6/10 Worst: 8/10 Pain location: lower back, bilateral hip Pain description: sharp, tight Aggravating factors: morning, bending forward, sweeping Relieving factors: medication  PRECAUTIONS: None  RED FLAGS: None   WEIGHT BEARING RESTRICTIONS: No  FALLS:  Has patient fallen in last 6 months? Yes. Number of falls - mechanical fall while in the yard  LIVING ENVIRONMENT: Lives with: lives alone Lives in: House/apartment  OCCUPATION: Retired  PLOF: Independent  PATIENT GOALS: improve comfort with ADLs, decrease back pain  OBJECTIVE:  Note: Objective measures were completed at Evaluation unless otherwise noted.  DIAGNOSTIC FINDINGS:  N/A  PATIENT SURVEYS:  Modified Oswestry: 27/50 - 54% disability  COGNITION: Overall cognitive status: Within functional limits for tasks assessed     SENSATION: WFL  POSTURE: rounded shoulders, forward head, and increased lumbar lordosis  PALPATION: Slight TTP to bilateral lumbar paraspinals  LUMBAR ROM:   AROM eval  Flexion 75%  Extension 50%  Right lateral flexion   Left lateral flexion   Right rotation   Left rotation    (Blank rows = not tested)  LOWER EXTREMITY MMT:    MMT Right eval Left eval  Hip flexion 3+/5 3+/5  Hip extension    Hip abduction 3+/5 3+/5  Hip adduction    Hip internal rotation    Hip external rotation    Knee flexion 5/5 5/5  Knee extension 5/5 5/5  Ankle dorsiflexion    Ankle plantarflexion    Ankle inversion    Ankle eversion     (Blank rows = not tested)  LUMBAR SPECIAL TESTS:  Straight leg raise test: Negative and Slump test: Positive  FUNCTIONAL TESTS:  Five Time Sit to Stand: 36 seconds with UE  GAIT: Distance walked: 34ft Assistive device utilized: None Level of assistance: Complete Independence Comments: trunk flexed, decreased gait speed  TREATMENT: [facility]  Adult PT Treatment:  DATE: 02/13/24 Therapeutic Activity: Nustep L5 UE/LE x 5 minutes for functional activity tolerance STS 2x10 - low table with out UE Standing lumbar extension at counter x 10  Standing hip abduction YTB x 10 each  STS x 5  18.4 sec  Heel raises   Therapeutic Exercise: SKTC PPT to Bridge 10 x 2  Supine PPT x 10 - 5" hold Supine PPT with ball 2x10  Gastroc stretch at counter     [facility] Adult PT Treatment:                                                DATE: 02/05/24 Therapeutic Activity: Nustep L5 UE/LE x 5 minutes for functional activity tolerance STS 2x10 - low table with out UE Seated repeated flexion with physioball 1 x 15  SKTC  Therapeutic Exercise: Supine pilates SLR 2x10 each PPT to Bridge 10 x 2  Hooklying clamshell 3x15 black band Supine PPT x 10 - 5" hold Supine PPT with ball 2x10  LAQ 2x10 3#  Modalities: Ice to lumbar spine during supine exercises   [facility] Adult PT Treatment:                                                DATE: 01/28/24 Therapeutic Activity: Nustep L5 UE/LE x 5 minutes for functional activity tolerance STS 2x10 - low table with UE Seated repeated flexion with physioball 2x15 Therapeutic Exercise: Supine pilates SLR 2x10 each Pilates bridge 2x10 Hooklying clamshell 3x15 black band Supine PPT x 10 - 5" hold Supine PPT with ball 2x10  Standing hip abduction 10 x 2  Standing heel raises x 15 Gastroc stretch  Modalities: Ice to lumbar spine during supine exercises  PATIENT EDUCATION:  Education details: continue HEP Person educated: Patient Education method: Explanation, Demonstration, and Handouts Education comprehension: verbalized understanding and returned demonstration  HOME EXERCISE PROGRAM: Access Code: [email or web address] URL: [email or web address] Date: 01/28/2024 Prepared by: Loral Roch  Exercises - Seated Sciatic Tensioner  - 1 x daily - 7 x weekly - 2  sets - 15 reps - Supine Posterior Pelvic Tilt  - 1 x daily - 7 x weekly - 2 sets - 10 reps - 5 sec hold - Supine Bridge  - 1 x daily - 7 x weekly - 2 sets - 10 reps - Hooklying Clamshell with Resistance  - 1 x daily - 7 x weekly - 2-3 sets - 15 reps - blue band hold - Standing Lumbar Extension with Counter  - 1 x daily - 7 x weekly - 2 sets - 10 reps - Hooklying Single Knee to Chest  - 1 x daily - 7 x weekly - 1 sets - 3 reps - 20 hold - Sit to Stand  - 1 x daily - 7 x weekly - 2 sets - 10 reps - Standing Hip Abduction with Counter Support  - 1 x daily - 7 x weekly - 2 sets - 10 reps - Standing Heel Raise with Support  - 1 x daily - 7 x weekly - 2 sets - 10 reps - Gastroc Stretch on Wall  - 1 x daily - 7 x weekly - 1 sets - 3 reps - 20 hold   ASSESSMENT:  CLINICAL IMPRESSION: Pt 5 x STS has improved. She reports overall improvement and feels like she is getting stronger. Requested updated HEP. Back pain continues to be worse in mornings. She did show therapist her right Lower leg which was red compared to Left and was encouraged to call MD regarding it to see if they think she should be seen.  Will continue to progress as able per POC.   EVAL: Patient is a 68 y.o. F who was seen today for physical therapy evaluation and treatment for chronic LBP. Physical findings are consistent with MD impression as pt demonstrates core and proximal hip weakness.  ODI demonstrates severe disability in performance of home ADLs and community activities. Pt would benefit from skilled PT services working on improving core and hip strength in order to decrease pain and improve comfort.   OBJECTIVE IMPAIRMENTS: decreased activity tolerance, decreased balance, decreased mobility, difficulty walking, decreased ROM, decreased strength, postural dysfunction, and pain   ACTIVITY LIMITATIONS: carrying, lifting, sitting, standing, squatting, stairs, transfers, and locomotion level  PARTICIPATION LIMITATIONS: driving,  shopping, community activity, occupation, and yard work  PERSONAL FACTORS: Time since onset of injury/illness/exacerbation and 1-2 comorbidities: HTN, R TKA are also affecting patient's functional outcome.   REHAB POTENTIAL: Good  CLINICAL DECISION MAKING: Stable/uncomplicated  EVALUATION COMPLEXITY: Low   GOALS: Goals reviewed with patient? No  SHORT TERM GOALS: Target date: 01/15/2024   Pt will be compliant and knowledgeable with initial HEP for improved comfort and carryover Baseline: initial HEP given  Goal status: INITIAL  2.  Pt will self report low back pain no greater than 5/10 for improved comfort and functional ability Baseline: 8/10 at worst Goal status: INITIAL   LONG TERM GOALS: Target date: 02/19/2024   Pt will be decrease ODI disability score to no greater than 40% (20/50) as proxy for functional improvement Baseline: 54% disability (27/50) Goal status: INITIAL  2.  Pt will self report low back pain no greater than 1-2/10 for improved comfort and functional ability Baseline: 8/10 at worst Goal status: INITIAL   3.  Pt will decrease Five Time Sit to Stand time to no less than 22 seconds for improved balance, strength, and functional mobility Baseline: 36 seconds with UE 02/13/24: 18.4 sec  Goal status: MET     4.  Pt will improve bilateral LE MMT to no less than 4/5 for improved functional mobility and decreased back pain Baseline:  Goal status: INITIAL  PLAN:  PT FREQUENCY: 2x/week  PT DURATION: 8 weeks  PLANNED INTERVENTIONS: 97164- PT Re-evaluation, 97110-Therapeutic exercises, 97530- Therapeutic activity, [id number]- Neuromuscular re-education, 97535- Self Care, 16109- Manual therapy, [id number]- Gait training, U0454- Electrical stimulation (unattended), [id number]- Electrical stimulation (manual), Dry Needling, Cryotherapy, and Moist heat.  PLAN FOR NEXT SESSION: assess HEP response, neutral spine core and hip strengthening   Gasper Karst, PTA 02/13/24 10:49  AM Phone: [phone] Fax: [phone]

## 2024-02-17 ENCOUNTER — Telehealth: Payer: Self-pay

## 2024-02-17 ENCOUNTER — Ambulatory Visit

## 2024-02-17 NOTE — Telephone Encounter (Signed)
 PT called and left voicemail message regarding missed visit on 02/17/2024. Left reminder of attendance policy and of next appointment.  Ivor Mars   02/17/24 9:04 AM

## 2024-02-17 NOTE — Therapy (Incomplete)
 OUTPATIENT PHYSICAL THERAPY TREATMENT   Patient Name: Tiffany Montes MRN: [id number] DOB:09-Apr-1956, 68 y.o., female Today's Date: 02/17/2024  END OF SESSION:        Past Medical History:  Diagnosis Date   ADHD    Anxiety    Attention deficit disorder    Back pain    Breast cancer (HCC) 2019   Left Breast Cancer   Depression    Gallbladder problem    Gallstones 09/05/2011   Hyperlipidemia    Hypertension    Hypothyroidism    Joint pain    Migraines    Neuropathy    Personal history of chemotherapy 2019   Left Breast Cancer   Personal history of radiation therapy 2019   Left Breast Cancer   PONV (postoperative nausea and vomiting)    diffficulty waking up   Spinal stenosis    Thyroid  disease    hypothyroid   Past Surgical History:  Procedure Laterality Date   ABDOMINAL HYSTERECTOMY  2003   BREAST BIOPSY Left 2014   benign   BREAST LUMPECTOMY Left 09/07/2018   BREAST LUMPECTOMY WITH RADIOACTIVE SEED AND SENTINEL LYMPH NODE BIOPSY Left 09/07/2018   Procedure: LEFT BREAST LUMPECTOMY WITH RADIOACTIVE SEED AND AXILLARY SENTINEL LYMPH NODE BIOPSY,INJECT BLUE DYE LEFT BREAST;  Surgeon: Boyce Byes, MD;  Location: [facility] OR;  Service: General;  Laterality: Left;   CATARACT EXTRACTION Bilateral 02/2005   CHOLECYSTECTOMY  09/26/2011   Procedure: LAPAROSCOPIC CHOLECYSTECTOMY WITH INTRAOPERATIVE CHOLANGIOGRAM;  Surgeon: Darcella Earnest, MD;  Location: [facility] OR;  Service: General;  Laterality: N/A;   COLONOSCOPY     CYSTIC HYGROMA EXCISION     DENTAL SURGERY     skin graft from top of mouth to lower gums   GALLBLADDER SURGERY  11/2016   HYSTEROTOMY  05/2012   PORT-A-CATH REMOVAL Right 11/04/2019   Procedure: REMOVAL PORT-A-CATH;  Surgeon: Dareen Ebbing, MD;  Location: [facility];  Service: General;  Laterality: Right;   PORTACATH PLACEMENT N/A 09/07/2018   Procedure: INSERTION PORT-A-CATH WITH US ;  Surgeon: Boyce Byes, MD;  Location: [facility] OR;   Service: General;  Laterality: N/A;   REPLACEMENT TOTAL KNEE Right    Patient Active Problem List   Diagnosis Date Noted   Depression 01/02/2021   Metabolic syndrome 01/02/2021   Pes anserine bursitis 01/03/2020   Degenerative joint disease of knee, right 01/03/2020   Morbid obesity with BMI of 40.0-44.9, adult (HCC) 02/11/2019   Port-A-Cath in place 10/30/2018   Malignant neoplasm of upper-inner quadrant of left breast in female, estrogen receptor positive (HCC) 08/11/2018   Prediabetes 02/24/2018   Varicose veins of legs 06/03/2017   Mixed hyperlipidemia 01/08/2010   Attention deficit disorder 04/14/2009   Hypothyroidism 04/03/2009   Major depressive disorder, recurrent episode (HCC) 04/03/2009   Essential hypertension 04/03/2009    PCP: Marquetta Sit, MD   REFERRING PROVIDER: Marquetta Sit, MD   REFERRING DIAG: M54.40 (ICD-10-CM) - Bilateral low back pain with sciatica, sciatica laterality unspecified, unspecified chronicity   Rationale for Evaluation and Treatment: Rehabilitation  THERAPY DIAG:  No diagnosis found.  ONSET DATE: Chronic  SUBJECTIVE:  SUBJECTIVE STATEMENT: ***    EVAL: Pt presents to PT with reports of chronic LBP that occasionally refers into bilateral hip. Denies N/T into LE except occasional foot numbness bilaterally. No trauma or MOI, notes forward flexion increases pain and lying supine helps. Also denies bowel/bladder changes or saddle anesthesia.   PERTINENT HISTORY:  HTN, R TKA  PAIN:  Are you having pain?  Yes: NPRS scale: 6/10 Worst: 8/10 Pain location: lower back, bilateral hip Pain description: sharp, tight Aggravating factors: morning, bending forward, sweeping Relieving factors: medication  PRECAUTIONS: None  RED FLAGS: None   WEIGHT  BEARING RESTRICTIONS: No  FALLS:  Has patient fallen in last 6 months? Yes. Number of falls - mechanical fall while in the yard  LIVING ENVIRONMENT: Lives with: lives alone Lives in: House/apartment  OCCUPATION: Retired  PLOF: Independent  PATIENT GOALS: improve comfort with ADLs, decrease back pain  OBJECTIVE:  Note: Objective measures were completed at Evaluation unless otherwise noted.  DIAGNOSTIC FINDINGS:  N/A  PATIENT SURVEYS:  Modified Oswestry: 27/50 - 54% disability  COGNITION: Overall cognitive status: Within functional limits for tasks assessed     SENSATION: WFL  POSTURE: rounded shoulders, forward head, and increased lumbar lordosis  PALPATION: Slight TTP to bilateral lumbar paraspinals  LUMBAR ROM:   AROM eval  Flexion 75%  Extension 50%  Right lateral flexion   Left lateral flexion   Right rotation   Left rotation    (Blank rows = not tested)  LOWER EXTREMITY MMT:    MMT Right eval Left eval  Hip flexion 3+/5 3+/5  Hip extension    Hip abduction 3+/5 3+/5  Hip adduction    Hip internal rotation    Hip external rotation    Knee flexion 5/5 5/5  Knee extension 5/5 5/5  Ankle dorsiflexion    Ankle plantarflexion    Ankle inversion    Ankle eversion     (Blank rows = not tested)  LUMBAR SPECIAL TESTS:  Straight leg raise test: Negative and Slump test: Positive  FUNCTIONAL TESTS:  Five Time Sit to Stand: 36 seconds with UE  GAIT: Distance walked: 59ft Assistive device utilized: None Level of assistance: Complete Independence Comments: trunk flexed, decreased gait speed  TREATMENT: [facility] Adult PT Treatment:                                                DATE: 02/17/24 Therapeutic Activity: Nustep L5 UE/LE x 5 minutes for functional activity tolerance STS 2x10 - low table with out UE Standing lumbar extension at counter x 10  Standing hip abduction YTB x 10 each  STS x 5  18.4 sec  Heel raises  Therapeutic  Exercise: SKTC PPT to Bridge 10 x 2  Supine PPT x 10 - 5" hold Supine PPT with ball 2x10  Gastroc stretch at counter   [facility] Adult PT Treatment:                                                DATE: 02/13/24 Therapeutic Activity: Nustep L5 UE/LE x 5 minutes for functional activity tolerance STS 2x10 - low table with out UE Standing lumbar extension at counter x 10  Standing hip abduction YTB x 10  each  STS x 5  18.4 sec  Heel raises  Therapeutic Exercise: SKTC PPT to Bridge 10 x 2  Supine PPT x 10 - 5" hold Supine PPT with ball 2x10  Gastroc stretch at counter     [facility] Adult PT Treatment:                                                DATE: 02/05/24 Therapeutic Activity: Nustep L5 UE/LE x 5 minutes for functional activity tolerance STS 2x10 - low table with out UE Seated repeated flexion with physioball 1 x 15  SKTC  Therapeutic Exercise: Supine pilates SLR 2x10 each PPT to Bridge 10 x 2  Hooklying clamshell 3x15 black band Supine PPT x 10 - 5" hold Supine PPT with ball 2x10  LAQ 2x10 3#  Modalities: Ice to lumbar spine during supine exercises   [facility] Adult PT Treatment:                                                DATE: 01/28/24 Therapeutic Activity: Nustep L5 UE/LE x 5 minutes for functional activity tolerance STS 2x10 - low table with UE Seated repeated flexion with physioball 2x15 Therapeutic Exercise: Supine pilates SLR 2x10 each Pilates bridge 2x10 Hooklying clamshell 3x15 black band Supine PPT x 10 - 5" hold Supine PPT with ball 2x10  Standing hip abduction 10 x 2  Standing heel raises x 15 Gastroc stretch  Modalities: Ice to lumbar spine during supine exercises  PATIENT EDUCATION:  Education details: continue HEP Person educated: Patient Education method: Explanation, Demonstration, and Handouts Education comprehension: verbalized understanding and returned demonstration  HOME EXERCISE PROGRAM: Access Code: [email or web address] URL:  [email or web address] Date: 01/28/2024 Prepared by: Loral Roch  Exercises - Seated Sciatic Tensioner  - 1 x daily - 7 x weekly - 2 sets - 15 reps - Supine Posterior Pelvic Tilt  - 1 x daily - 7 x weekly - 2 sets - 10 reps - 5 sec hold - Supine Bridge  - 1 x daily - 7 x weekly - 2 sets - 10 reps - Hooklying Clamshell with Resistance  - 1 x daily - 7 x weekly - 2-3 sets - 15 reps - blue band hold - Standing Lumbar Extension with Counter  - 1 x daily - 7 x weekly - 2 sets - 10 reps - Hooklying Single Knee to Chest  - 1 x daily - 7 x weekly - 1 sets - 3 reps - 20 hold - Sit to Stand  - 1 x daily - 7 x weekly - 2 sets - 10 reps - Standing Hip Abduction with Counter Support  - 1 x daily - 7 x weekly - 2 sets - 10 reps - Standing Heel Raise with Support  - 1 x daily - 7 x weekly - 2 sets - 10 reps - Gastroc Stretch on Wall  - 1 x daily - 7 x weekly - 1 sets - 3 reps - 20 hold   ASSESSMENT:  CLINICAL IMPRESSION: ***  EVAL: Patient is a 68 y.o. F who was seen today for physical therapy evaluation and treatment for chronic LBP. Physical findings are consistent with MD impression as pt demonstrates core and proximal  hip weakness.  ODI demonstrates severe disability in performance of home ADLs and community activities. Pt would benefit from skilled PT services working on improving core and hip strength in order to decrease pain and improve comfort.   OBJECTIVE IMPAIRMENTS: decreased activity tolerance, decreased balance, decreased mobility, difficulty walking, decreased ROM, decreased strength, postural dysfunction, and pain   ACTIVITY LIMITATIONS: carrying, lifting, sitting, standing, squatting, stairs, transfers, and locomotion level  PARTICIPATION LIMITATIONS: driving, shopping, community activity, occupation, and yard work  PERSONAL FACTORS: Time since onset of injury/illness/exacerbation and 1-2 comorbidities: HTN, R TKA are also affecting patient's functional outcome.    REHAB POTENTIAL: Good  CLINICAL DECISION MAKING: Stable/uncomplicated  EVALUATION COMPLEXITY: Low   GOALS: Goals reviewed with patient? No  SHORT TERM GOALS: Target date: 01/15/2024   Pt will be compliant and knowledgeable with initial HEP for improved comfort and carryover Baseline: initial HEP given  Goal status: INITIAL  2.  Pt will self report low back pain no greater than 5/10 for improved comfort and functional ability Baseline: 8/10 at worst Goal status: INITIAL   LONG TERM GOALS: Target date: 02/19/2024   Pt will be decrease ODI disability score to no greater than 40% (20/50) as proxy for functional improvement Baseline: 54% disability (27/50) Goal status: INITIAL  2.  Pt will self report low back pain no greater than 1-2/10 for improved comfort and functional ability Baseline: 8/10 at worst Goal status: INITIAL   3.  Pt will decrease Five Time Sit to Stand time to no less than 22 seconds for improved balance, strength, and functional mobility Baseline: 36 seconds with UE 02/13/24: 18.4 sec  Goal status: MET     4.  Pt will improve bilateral LE MMT to no less than 4/5 for improved functional mobility and decreased back pain Baseline:  Goal status: INITIAL  PLAN:  PT FREQUENCY: 2x/week  PT DURATION: 8 weeks  PLANNED INTERVENTIONS: 97164- PT Re-evaluation, 97110-Therapeutic exercises, 97530- Therapeutic activity, [id number]- Neuromuscular re-education, 97535- Self Care, [id number]- Manual therapy, [id number]- Gait training, [id number]- Electrical stimulation (unattended), [id number]- Electrical stimulation (manual), Dry Needling, Cryotherapy, and Moist heat.  PLAN FOR NEXT SESSION: assess HEP response, neutral spine core and hip strengthening   Ivor Mars PT  02/17/24 7:57 AM

## 2024-02-19 ENCOUNTER — Ambulatory Visit

## 2024-02-19 DIAGNOSIS — M6281 Muscle weakness (generalized): Secondary | ICD-10-CM

## 2024-02-19 DIAGNOSIS — M5459 Other low back pain: Secondary | ICD-10-CM

## 2024-02-19 NOTE — Therapy (Signed)
 OUTPATIENT PHYSICAL THERAPY TREATMENT   Patient Name: Tiffany Montes MRN: [id number] DOB:1955/12/21, 68 y.o., female Today's Date: 02/19/2024  END OF SESSION:  PT End of Session - 02/19/24 0851     Visit Number 9    Number of Visits 17    Date for PT Re-Evaluation 02/19/24    Authorization Type MCR    PT Start Time 0851    PT Stop Time 0929    PT Time Calculation (min) 38 min                  Past Medical History:  Diagnosis Date   ADHD    Anxiety    Attention deficit disorder    Back pain    Breast cancer (HCC) 2019   Left Breast Cancer   Depression    Gallbladder problem    Gallstones 09/05/2011   Hyperlipidemia    Hypertension    Hypothyroidism    Joint pain    Migraines    Neuropathy    Personal history of chemotherapy 2019   Left Breast Cancer   Personal history of radiation therapy 2019   Left Breast Cancer   PONV (postoperative nausea and vomiting)    diffficulty waking up   Spinal stenosis    Thyroid  disease    hypothyroid   Past Surgical History:  Procedure Laterality Date   ABDOMINAL HYSTERECTOMY  2003   BREAST BIOPSY Left 2014   benign   BREAST LUMPECTOMY Left 09/07/2018   BREAST LUMPECTOMY WITH RADIOACTIVE SEED AND SENTINEL LYMPH NODE BIOPSY Left 09/07/2018   Procedure: LEFT BREAST LUMPECTOMY WITH RADIOACTIVE SEED AND AXILLARY SENTINEL LYMPH NODE BIOPSY,INJECT BLUE DYE LEFT BREAST;  Surgeon: Boyce Byes, MD;  Location: [facility] OR;  Service: General;  Laterality: Left;   CATARACT EXTRACTION Bilateral 02/2005   CHOLECYSTECTOMY  09/26/2011   Procedure: LAPAROSCOPIC CHOLECYSTECTOMY WITH INTRAOPERATIVE CHOLANGIOGRAM;  Surgeon: Darcella Earnest, MD;  Location: [facility] OR;  Service: General;  Laterality: N/A;   COLONOSCOPY     CYSTIC HYGROMA EXCISION     DENTAL SURGERY     skin graft from top of mouth to lower gums   GALLBLADDER SURGERY  11/2016   HYSTEROTOMY  05/2012   PORT-A-CATH REMOVAL Right 11/04/2019   Procedure: REMOVAL  PORT-A-CATH;  Surgeon: Dareen Ebbing, MD;  Location: [facility];  Service: General;  Laterality: Right;   PORTACATH PLACEMENT N/A 09/07/2018   Procedure: INSERTION PORT-A-CATH WITH US ;  Surgeon: Boyce Byes, MD;  Location: [facility] OR;  Service: General;  Laterality: N/A;   REPLACEMENT TOTAL KNEE Right    Patient Active Problem List   Diagnosis Date Noted   Depression 01/02/2021   Metabolic syndrome 01/02/2021   Pes anserine bursitis 01/03/2020   Degenerative joint disease of knee, right 01/03/2020   Morbid obesity with BMI of 40.0-44.9, adult (HCC) 02/11/2019   Port-A-Cath in place 10/30/2018   Malignant neoplasm of upper-inner quadrant of left breast in female, estrogen receptor positive (HCC) 08/11/2018   Prediabetes 02/24/2018   Varicose veins of legs 06/03/2017   Mixed hyperlipidemia 01/08/2010   Attention deficit disorder 04/14/2009   Hypothyroidism 04/03/2009   Major depressive disorder, recurrent episode (HCC) 04/03/2009   Essential hypertension 04/03/2009    PCP: Marquetta Sit, MD   REFERRING PROVIDER: Marquetta Sit, MD   REFERRING DIAG: M54.40 (ICD-10-CM) - Bilateral low back pain with sciatica, sciatica laterality unspecified, unspecified chronicity   Rationale for Evaluation and Treatment: Rehabilitation  THERAPY DIAG:  Other low back  pain  Muscle weakness (generalized)  ONSET DATE: Chronic  SUBJECTIVE:                                                                                                                                                                                           SUBJECTIVE STATEMENT: Pt presents to PT with reports of back and hip pain but getting better. Was sick last session which is why she missed.   EVAL: Pt presents to PT with reports of chronic LBP that occasionally refers into bilateral hip. Denies N/T into LE except occasional foot numbness bilaterally. No trauma or MOI, notes forward flexion increases pain  and lying supine helps. Also denies bowel/bladder changes or saddle anesthesia.   PERTINENT HISTORY:  HTN, R TKA  PAIN:  Are you having pain?  Yes: NPRS scale: 6/10 Worst: 8/10 Pain location: lower back, bilateral hip Pain description: sharp, tight Aggravating factors: morning, bending forward, sweeping Relieving factors: medication  PRECAUTIONS: None  RED FLAGS: None   WEIGHT BEARING RESTRICTIONS: No  FALLS:  Has patient fallen in last 6 months? Yes. Number of falls - mechanical fall while in the yard  LIVING ENVIRONMENT: Lives with: lives alone Lives in: House/apartment  OCCUPATION: Retired  PLOF: Independent  PATIENT GOALS: improve comfort with ADLs, decrease back pain  OBJECTIVE:  Note: Objective measures were completed at Evaluation unless otherwise noted.  DIAGNOSTIC FINDINGS:  N/A  PATIENT SURVEYS:  Modified Oswestry: 27/50 - 54% disability  COGNITION: Overall cognitive status: Within functional limits for tasks assessed     SENSATION: WFL  POSTURE: rounded shoulders, forward head, and increased lumbar lordosis  PALPATION: Slight TTP to bilateral lumbar paraspinals  LUMBAR ROM:   AROM eval  Flexion 75%  Extension 50%  Right lateral flexion   Left lateral flexion   Right rotation   Left rotation    (Blank rows = not tested)  LOWER EXTREMITY MMT:    MMT Right eval Left eval  Hip flexion 3+/5 3+/5  Hip extension    Hip abduction 3+/5 3+/5  Hip adduction    Hip internal rotation    Hip external rotation    Knee flexion 5/5 5/5  Knee extension 5/5 5/5  Ankle dorsiflexion    Ankle plantarflexion    Ankle inversion    Ankle eversion     (Blank rows = not tested)  LUMBAR SPECIAL TESTS:  Straight leg raise test: Negative and Slump test: Positive  FUNCTIONAL TESTS:  Five Time Sit to Stand: 36 seconds with UE  GAIT: Distance walked: 35ft Assistive device utilized: None Level of assistance: Complete Independence Comments: trunk  flexed, decreased gait  speed  TREATMENT: [facility] Adult PT Treatment:                                                DATE: 02/19/24 Therapeutic Activity: Nustep L5 UE/LE x 3 minutes for functional activity tolerance LTR x 10 STS 2x10 - low table with out UE Standing lumbar extension at counter x 10  Standing hip abduction YTB 2x10 each  Therapeutic Exercise: Supine PPT x 10 - 5" hold Supine PPT with ball 2x10  Hooklying clamshell 3x15 GTB Supine pilates SLR 2x10 each  [facility] Adult PT Treatment:                                                DATE: 02/13/24 Therapeutic Activity: Nustep L5 UE/LE x 5 minutes for functional activity tolerance STS 2x10 - low table with out UE Standing lumbar extension at counter x 10  Standing hip abduction YTB x 10 each  STS x 5  18.4 sec  Heel raises  Therapeutic Exercise: SKTC PPT to Bridge 10 x 2  Supine PPT x 10 - 5" hold Supine PPT with ball 2x10  Gastroc stretch at counter     [facility] Adult PT Treatment:                                                DATE: 02/05/24 Therapeutic Activity: Nustep L5 UE/LE x 5 minutes for functional activity tolerance STS 2x10 - low table with out UE Seated repeated flexion with physioball 1 x 15  SKTC  Therapeutic Exercise: Supine pilates SLR 2x10 each PPT to Bridge 10 x 2  Hooklying clamshell 3x15 black band Supine PPT x 10 - 5" hold Supine PPT with ball 2x10  LAQ 2x10 3#  Modalities: Ice to lumbar spine during supine exercises   [facility] Adult PT Treatment:                                                DATE: 01/28/24 Therapeutic Activity: Nustep L5 UE/LE x 5 minutes for functional activity tolerance STS 2x10 - low table with UE Seated repeated flexion with physioball 2x15 Therapeutic Exercise: Supine pilates SLR 2x10 each Pilates bridge 2x10 Hooklying clamshell 3x15 black band Supine PPT x 10 - 5" hold Supine PPT with ball 2x10  Standing hip abduction 10 x 2  Standing heel raises x 15 Gastroc stretch   Modalities: Ice to lumbar spine during supine exercises  PATIENT EDUCATION:  Education details: continue HEP Person educated: Patient Education method: Explanation, Demonstration, and Handouts Education comprehension: verbalized understanding and returned demonstration  HOME EXERCISE PROGRAM: Access Code: [email or web address] URL: [email or web address] Date: 01/28/2024 Prepared by: Loral Roch  Exercises - Seated Sciatic Tensioner  - 1 x daily - 7 x weekly - 2 sets - 15 reps - Supine Posterior Pelvic Tilt  - 1 x daily - 7 x weekly - 2 sets - 10 reps - 5 sec hold - Supine Bridge  - 1 x  daily - 7 x weekly - 2 sets - 10 reps - Hooklying Clamshell with Resistance  - 1 x daily - 7 x weekly - 2-3 sets - 15 reps - blue band hold - Standing Lumbar Extension with Counter  - 1 x daily - 7 x weekly - 2 sets - 10 reps - Hooklying Single Knee to Chest  - 1 x daily - 7 x weekly - 1 sets - 3 reps - 20 hold - Sit to Stand  - 1 x daily - 7 x weekly - 2 sets - 10 reps - Standing Hip Abduction with Counter Support  - 1 x daily - 7 x weekly - 2 sets - 10 reps - Standing Heel Raise with Support  - 1 x daily - 7 x weekly - 2 sets - 10 reps - Gastroc Stretch on Wall  - 1 x daily - 7 x weekly - 1 sets - 3 reps - 20 hold   ASSESSMENT:  CLINICAL IMPRESSION: Pt was able to complete all prescribed exercises with no adverse effect. Exercises today focused on continued core and proximal hip strengthening as well as exercises with purpose on improving functional mobility and activity tolerance. Continues to have weakness and limited mobility, but does continue to benefit from therapy. Will continue to progress as able per POC.   EVAL: Patient is a 68 y.o. F who was seen today for physical therapy evaluation and treatment for chronic LBP. Physical findings are consistent with MD impression as pt demonstrates core and proximal hip weakness.  ODI demonstrates severe disability in performance of home ADLs and  community activities. Pt would benefit from skilled PT services working on improving core and hip strength in order to decrease pain and improve comfort.   OBJECTIVE IMPAIRMENTS: decreased activity tolerance, decreased balance, decreased mobility, difficulty walking, decreased ROM, decreased strength, postural dysfunction, and pain   ACTIVITY LIMITATIONS: carrying, lifting, sitting, standing, squatting, stairs, transfers, and locomotion level  PARTICIPATION LIMITATIONS: driving, shopping, community activity, occupation, and yard work  PERSONAL FACTORS: Time since onset of injury/illness/exacerbation and 1-2 comorbidities: HTN, R TKA are also affecting patient's functional outcome.   REHAB POTENTIAL: Good  CLINICAL DECISION MAKING: Stable/uncomplicated  EVALUATION COMPLEXITY: Low   GOALS: Goals reviewed with patient? No  SHORT TERM GOALS: Target date: 01/15/2024   Pt will be compliant and knowledgeable with initial HEP for improved comfort and carryover Baseline: initial HEP given  Goal status: MET  2.  Pt will self report low back pain no greater than 5/10 for improved comfort and functional ability Baseline: 8/10 at worst Goal status: MET   LONG TERM GOALS: Target date: 04/01/2024   Pt will be decrease ODI disability score to no greater than 40% (20/50) as proxy for functional improvement Baseline: 54% disability (27/50) Goal status: INITIAL  2.  Pt will self report low back pain no greater than 1-2/10 for improved comfort and functional ability Baseline: 8/10 at worst Goal status: INITIAL   3.  Pt will decrease Five Time Sit to Stand time to no less than 22 seconds for improved balance, strength, and functional mobility Baseline: 36 seconds with UE 02/13/24: 18.4 sec  Goal status: MET     4.  Pt will improve bilateral LE MMT to no less than 4/5 for improved functional mobility and decreased back pain Baseline:  Goal status: INITIAL  PLAN:  PT FREQUENCY: 2x/week  PT  DURATION: 8 weeks  PLANNED INTERVENTIONS: [id number]- PT Re-evaluation, 97110-Therapeutic exercises, 97530- Therapeutic activity,  [id number]- Neuromuscular re-education, [id number]- Self Care, [id number]- Manual therapy, [id number]- Gait training, [phone]- Electrical stimulation (unattended), [id number]- Electrical stimulation (manual), Dry Needling, Cryotherapy, and Moist heat.  PLAN FOR NEXT SESSION: assess HEP response, neutral spine core and hip strengthening   Ivor Mars PT  02/19/24 10:29 AM

## 2024-02-20 ENCOUNTER — Other Ambulatory Visit: Payer: Self-pay | Admitting: Family Medicine

## 2024-02-23 ENCOUNTER — Ambulatory Visit

## 2024-02-23 ENCOUNTER — Other Ambulatory Visit: Payer: Self-pay | Admitting: Family Medicine

## 2024-02-23 DIAGNOSIS — M5459 Other low back pain: Secondary | ICD-10-CM

## 2024-02-23 DIAGNOSIS — M6281 Muscle weakness (generalized): Secondary | ICD-10-CM

## 2024-02-23 NOTE — Therapy (Signed)
 OUTPATIENT PHYSICAL THERAPY TREATMENT  Progress Note Reporting Period 12/25/2023 to 02/23/2024  See note below for Objective Data and Assessment of Progress/Goals.      Patient Name: Tiffany Montes MRN: [id number] DOB:05/12/1956, 68 y.o., female Today's Date: 02/23/2024  END OF SESSION:  PT End of Session - 02/23/24 1028     Visit Number 10    Number of Visits 17    Date for PT Re-Evaluation 04/01/24    Authorization Type MCR    PT Start Time 1028   arrived late   PT Stop Time 1058    PT Time Calculation (min) 30 min                   Past Medical History:  Diagnosis Date   ADHD    Anxiety    Attention deficit disorder    Back pain    Breast cancer (HCC) 2019   Left Breast Cancer   Depression    Gallbladder problem    Gallstones 09/05/2011   Hyperlipidemia    Hypertension    Hypothyroidism    Joint pain    Migraines    Neuropathy    Personal history of chemotherapy 2019   Left Breast Cancer   Personal history of radiation therapy 2019   Left Breast Cancer   PONV (postoperative nausea and vomiting)    diffficulty waking up   Spinal stenosis    Thyroid  disease    hypothyroid   Past Surgical History:  Procedure Laterality Date   ABDOMINAL HYSTERECTOMY  2003   BREAST BIOPSY Left 2014   benign   BREAST LUMPECTOMY Left 09/07/2018   BREAST LUMPECTOMY WITH RADIOACTIVE SEED AND SENTINEL LYMPH NODE BIOPSY Left 09/07/2018   Procedure: LEFT BREAST LUMPECTOMY WITH RADIOACTIVE SEED AND AXILLARY SENTINEL LYMPH NODE BIOPSY,INJECT BLUE DYE LEFT BREAST;  Surgeon: Boyce Byes, MD;  Location: [facility] OR;  Service: General;  Laterality: Left;   CATARACT EXTRACTION Bilateral 02/2005   CHOLECYSTECTOMY  09/26/2011   Procedure: LAPAROSCOPIC CHOLECYSTECTOMY WITH INTRAOPERATIVE CHOLANGIOGRAM;  Surgeon: Darcella Earnest, MD;  Location: [facility] OR;  Service: General;  Laterality: N/A;   COLONOSCOPY     CYSTIC HYGROMA EXCISION     DENTAL SURGERY     skin graft from top  of mouth to lower gums   GALLBLADDER SURGERY  11/2016   HYSTEROTOMY  05/2012   PORT-A-CATH REMOVAL Right 11/04/2019   Procedure: REMOVAL PORT-A-CATH;  Surgeon: Dareen Ebbing, MD;  Location: [facility];  Service: General;  Laterality: Right;   PORTACATH PLACEMENT N/A 09/07/2018   Procedure: INSERTION PORT-A-CATH WITH US ;  Surgeon: Boyce Byes, MD;  Location: [facility] OR;  Service: General;  Laterality: N/A;   REPLACEMENT TOTAL KNEE Right    Patient Active Problem List   Diagnosis Date Noted   Depression 01/02/2021   Metabolic syndrome 01/02/2021   Pes anserine bursitis 01/03/2020   Degenerative joint disease of knee, right 01/03/2020   Morbid obesity with BMI of 40.0-44.9, adult (HCC) 02/11/2019   Port-A-Cath in place 10/30/2018   Malignant neoplasm of upper-inner quadrant of left breast in female, estrogen receptor positive (HCC) 08/11/2018   Prediabetes 02/24/2018   Varicose veins of legs 06/03/2017   Mixed hyperlipidemia 01/08/2010   Attention deficit disorder 04/14/2009   Hypothyroidism 04/03/2009   Major depressive disorder, recurrent episode (HCC) 04/03/2009   Essential hypertension 04/03/2009    PCP: Marquetta Sit, MD   REFERRING PROVIDER: Marquetta Sit, MD   REFERRING DIAG: M54.40 (ICD-10-CM) -  Bilateral low back pain with sciatica, sciatica laterality unspecified, unspecified chronicity   Rationale for Evaluation and Treatment: Rehabilitation  THERAPY DIAG:  Other low back pain  Muscle weakness (generalized)  ONSET DATE: Chronic  SUBJECTIVE:                                                                                                                                                                                           SUBJECTIVE STATEMENT: Pt presents to PT with reports of increased back pain after pulling weeds over weekend. Has continued HEP compliance.   EVAL: Pt presents to PT with reports of chronic LBP that occasionally refers  into bilateral hip. Denies N/T into LE except occasional foot numbness bilaterally. No trauma or MOI, notes forward flexion increases pain and lying supine helps. Also denies bowel/bladder changes or saddle anesthesia.   PERTINENT HISTORY:  HTN, R TKA  PAIN:  Are you having pain?  Yes: NPRS scale: 6/10 Worst: 8/10 Pain location: lower back, bilateral hip Pain description: sharp, tight Aggravating factors: morning, bending forward, sweeping Relieving factors: medication  PRECAUTIONS: None  RED FLAGS: None   WEIGHT BEARING RESTRICTIONS: No  FALLS:  Has patient fallen in last 6 months? Yes. Number of falls - mechanical fall while in the yard  LIVING ENVIRONMENT: Lives with: lives alone Lives in: House/apartment  OCCUPATION: Retired  PLOF: Independent  PATIENT GOALS: improve comfort with ADLs, decrease back pain  OBJECTIVE:  Note: Objective measures were completed at Evaluation unless otherwise noted.  DIAGNOSTIC FINDINGS:  N/A  PATIENT SURVEYS:  Modified Oswestry: 27/50 - 54% disability 02/23/2024: 24/50 48% disability   COGNITION: Overall cognitive status: Within functional limits for tasks assessed     SENSATION: WFL  POSTURE: rounded shoulders, forward head, and increased lumbar lordosis  PALPATION: Slight TTP to bilateral lumbar paraspinals  LUMBAR ROM:   AROM eval  Flexion 75%  Extension 50%  Right lateral flexion   Left lateral flexion   Right rotation   Left rotation    (Blank rows = not tested)  LOWER EXTREMITY MMT:    MMT Right eval Left eval  Hip flexion 3+/5 3+/5  Hip extension    Hip abduction 3+/5 3+/5  Hip adduction    Hip internal rotation    Hip external rotation    Knee flexion 5/5 5/5  Knee extension 5/5 5/5  Ankle dorsiflexion    Ankle plantarflexion    Ankle inversion    Ankle eversion     (Blank rows = not tested)  LUMBAR SPECIAL TESTS:  Straight leg raise test: Negative and Slump test: Positive  FUNCTIONAL  TESTS:  Five  Time Sit to Stand: 36 seconds with UE  GAIT: Distance walked: 57ft Assistive device utilized: None Level of assistance: Complete Independence Comments: trunk flexed, decreased gait speed  TREATMENT: [facility] Adult PT Treatment:                                                DATE: 02/23/24 Therapeutic Activity: LTR x 10 STS 2x10 - low table with out UE Assessment of tests/meaures, goals, and outcomes for progress note Therapeutic Exercise: Supine PPT x 10 - 5" hold Supine PPT with ball 2x10  Hooklying clamshell 3x15 blue band Hooklying march with blue band 2x20 Supine pilates SLR 2x10 each  [facility] Adult PT Treatment:                                                DATE: 02/19/24 Therapeutic Activity: Nustep L5 UE/LE x 3 minutes for functional activity tolerance LTR x 10 STS 2x10 - low table with out UE Standing lumbar extension at counter x 10  Standing hip abduction YTB 2x10 each  Therapeutic Exercise: Supine PPT x 10 - 5" hold Supine PPT with ball 2x10  Hooklying clamshell 3x15 GTB Supine pilates SLR 2x10 each  [facility] Adult PT Treatment:                                                DATE: 02/13/24 Therapeutic Activity: Nustep L5 UE/LE x 5 minutes for functional activity tolerance STS 2x10 - low table with out UE Standing lumbar extension at counter x 10  Standing hip abduction YTB x 10 each  STS x 5  18.4 sec  Heel raises  Therapeutic Exercise: SKTC PPT to Bridge 10 x 2  Supine PPT x 10 - 5" hold Supine PPT with ball 2x10  Gastroc stretch at counter     [facility] Adult PT Treatment:                                                DATE: 02/05/24 Therapeutic Activity: Nustep L5 UE/LE x 5 minutes for functional activity tolerance STS 2x10 - low table with out UE Seated repeated flexion with physioball 1 x 15  SKTC  Therapeutic Exercise: Supine pilates SLR 2x10 each PPT to Bridge 10 x 2  Hooklying clamshell 3x15 black band Supine PPT x 10 - 5" hold Supine PPT  with ball 2x10  LAQ 2x10 3#  Modalities: Ice to lumbar spine during supine exercises   [facility] Adult PT Treatment:                                                DATE: 01/28/24 Therapeutic Activity: Nustep L5 UE/LE x 5 minutes for functional activity tolerance STS 2x10 - low table with UE Seated repeated flexion with physioball 2x15 Therapeutic Exercise: Supine pilates SLR 2x10 each Pilates  bridge 2x10 Hooklying clamshell 3x15 black band Supine PPT x 10 - 5" hold Supine PPT with ball 2x10  Standing hip abduction 10 x 2  Standing heel raises x 15 Gastroc stretch  Modalities: Ice to lumbar spine during supine exercises  PATIENT EDUCATION:  Education details: continue HEP Person educated: Patient Education method: Explanation, Demonstration, and Handouts Education comprehension: verbalized understanding and returned demonstration  HOME EXERCISE PROGRAM: Access Code: [email or web address] URL: [email or web address] Date: 01/28/2024 Prepared by: Loral Roch  Exercises - Seated Sciatic Tensioner  - 1 x daily - 7 x weekly - 2 sets - 15 reps - Supine Posterior Pelvic Tilt  - 1 x daily - 7 x weekly - 2 sets - 10 reps - 5 sec hold - Supine Bridge  - 1 x daily - 7 x weekly - 2 sets - 10 reps - Hooklying Clamshell with Resistance  - 1 x daily - 7 x weekly - 2-3 sets - 15 reps - blue band hold - Standing Lumbar Extension with Counter  - 1 x daily - 7 x weekly - 2 sets - 10 reps - Hooklying Single Knee to Chest  - 1 x daily - 7 x weekly - 1 sets - 3 reps - 20 hold - Sit to Stand  - 1 x daily - 7 x weekly - 2 sets - 10 reps - Standing Hip Abduction with Counter Support  - 1 x daily - 7 x weekly - 2 sets - 10 reps - Standing Heel Raise with Support  - 1 x daily - 7 x weekly - 2 sets - 10 reps - Gastroc Stretch on Wall  - 1 x daily - 7 x weekly - 1 sets - 3 reps - 20 hold   ASSESSMENT:  CLINICAL IMPRESSION: Pt was able to complete all prescribed exercises with no adverse effect.  Exercises today focused on continued core and proximal hip strengthening as well as exercises with purpose on improving functional mobility and activity tolerance. ODI score showed decrease today showing subjective improvement in functional ability. Continues to have weakness and limited mobility, but does continue to benefit from therapy. Will continue to progress as able per POC.   EVAL: Patient is a 68 y.o. F who was seen today for physical therapy evaluation and treatment for chronic LBP. Physical findings are consistent with MD impression as pt demonstrates core and proximal hip weakness.  ODI demonstrates severe disability in performance of home ADLs and community activities. Pt would benefit from skilled PT services working on improving core and hip strength in order to decrease pain and improve comfort.   OBJECTIVE IMPAIRMENTS: decreased activity tolerance, decreased balance, decreased mobility, difficulty walking, decreased ROM, decreased strength, postural dysfunction, and pain   ACTIVITY LIMITATIONS: carrying, lifting, sitting, standing, squatting, stairs, transfers, and locomotion level  PARTICIPATION LIMITATIONS: driving, shopping, community activity, occupation, and yard work  PERSONAL FACTORS: Time since onset of injury/illness/exacerbation and 1-2 comorbidities: HTN, R TKA are also affecting patient's functional outcome.   REHAB POTENTIAL: Good  CLINICAL DECISION MAKING: Stable/uncomplicated  EVALUATION COMPLEXITY: Low   GOALS: Goals reviewed with patient? No  SHORT TERM GOALS: Target date: 01/15/2024   Pt will be compliant and knowledgeable with initial HEP for improved comfort and carryover Baseline: initial HEP given  Goal status: MET  2.  Pt will self report low back pain no greater than 5/10 for improved comfort and functional ability Baseline: 8/10 at worst Goal status: MET   LONG TERM GOALS: Target date: 04/01/2024  Pt will be decrease ODI disability score to  no greater than 40% (20/50) as proxy for functional improvement Baseline: 54% disability (27/50) 02/23/2024: 24/50 48% disability  Goal status: ONGOING  2.  Pt will self report low back pain no greater than 1-2/10 for improved comfort and functional ability Baseline: 8/10 at worst Goal status: IN PROGRESS   3.  Pt will decrease Five Time Sit to Stand time to no less than 22 seconds for improved balance, strength, and functional mobility Baseline: 36 seconds with UE 02/13/24: 18.4 sec  Goal status: MET     4.  Pt will improve bilateral LE MMT to no less than 4/5 for improved functional mobility and decreased back pain Baseline:  Goal status: ONGOING  PLAN:  PT FREQUENCY: 2x/week  PT DURATION: 8 weeks  PLANNED INTERVENTIONS: 97164- PT Re-evaluation, 97110-Therapeutic exercises, 97530- Therapeutic activity, [id number]- Neuromuscular re-education, 97535- Self Care, 16109- Manual therapy, [id number]- Gait training, U0454- Electrical stimulation (unattended), [id number]- Electrical stimulation (manual), Dry Needling, Cryotherapy, and Moist heat.  PLAN FOR NEXT SESSION: assess HEP response, neutral spine core and hip strengthening   Ivor Mars PT  02/23/24 12:06 PM

## 2024-02-23 NOTE — Telephone Encounter (Signed)
 Copied from CRM [id number]. Topic: Clinical - Medication Refill >> Feb 23, 2024 12:35 PM Tiffany Montes wrote: Medication: amphetamine -dextroamphetamine  (ADDERALL ) 30 MG tablet  Has the patient contacted their pharmacy? Yes (Agent: If no, request that the patient contact the pharmacy for the refill. If patient does not wish to contact the pharmacy document the reason why and proceed with request.) (Agent: If yes, when and what did the pharmacy advise?)  This is the patient's preferred pharmacy:  [id number] - [place], [place] - [address] AT [address]NER OF [facility] [address] [place] [address] Phone: [phone] Fax: [phone]    Is this the correct pharmacy for this prescription? Yes If no, delete pharmacy and type the correct one.   Has the prescription been filled recently? No  Is the patient out of the medication? Yes  Has the patient been seen for an appointment in the last year OR does the patient have an upcoming appointment? Yes  Can we respond through MyChart? Yes  Agent: Please be advised that Rx refills may take up to 3 business days. We ask that you follow-up with your pharmacy.

## 2024-02-23 NOTE — Telephone Encounter (Unsigned)
 Copied from CRM [id number]. Topic: Clinical - Medication Refill >> Feb 23, 2024 12:26 PM Valeri Gate H wrote: Medication: amphetamine -dextroamphetamine  (ADDERALL ) 30 MG tablet  Has the patient contacted their pharmacy? Yes (Agent: If no, request that the patient contact the pharmacy for the refill. If patient does not wish to contact the pharmacy document the reason why and proceed with request.) (Agent: If yes, when and what did the pharmacy advise?)  This is the patient's preferred pharmacy:  [id number] - [place], [place] - [address] AT [address] OF [facility] [address]  [place] [address] Phone: [phone] Fax: [phone]   Is this the correct pharmacy for this prescription? Yes If no, delete pharmacy and type the correct one.   Has the prescription been filled recently? No  Is the patient out of the medication? Yes  Has the patient been seen for an appointment in the last year OR does the patient have an upcoming appointment? Yes  Can we respond through MyChart? Yes  Agent: Please be advised that Rx refills may take up to 3 business days. We ask that you follow-up with your pharmacy.

## 2024-02-25 MED ORDER — AMPHETAMINE-DEXTROAMPHETAMINE 30 MG PO TABS: 30.0000 mg | ORAL_TABLET | Freq: Every day | ORAL | 0 refills | Status: DC

## 2024-02-27 ENCOUNTER — Ambulatory Visit

## 2024-03-02 ENCOUNTER — Ambulatory Visit

## 2024-03-02 NOTE — Therapy (Incomplete)
 OUTPATIENT PHYSICAL THERAPY TREATMENT  Progress Note Reporting Period 12/25/2023 to 02/23/2024  See note below for Objective Data and Assessment of Progress/Goals.      Patient Name: Tiffany Montes MRN: [id number] DOB:[birth year], 68 y.o., female Today's Date: 03/02/2024  END OF SESSION:          Past Medical History:  Diagnosis Date   ADHD    Anxiety    Attention deficit disorder    Back pain    Breast cancer (HCC) 2019   Left Breast Cancer   Depression    Gallbladder problem    Gallstones 09/05/2011   Hyperlipidemia    Hypertension    Hypothyroidism    Joint pain    Migraines    Neuropathy    Personal history of chemotherapy 2019   Left Breast Cancer   Personal history of radiation therapy 2019   Left Breast Cancer   PONV (postoperative nausea and vomiting)    diffficulty waking up   Spinal stenosis    Thyroid  disease    hypothyroid   Past Surgical History:  Procedure Laterality Date   ABDOMINAL HYSTERECTOMY  2003   BREAST BIOPSY Left 2014   benign   BREAST LUMPECTOMY Left 09/07/2018   BREAST LUMPECTOMY WITH RADIOACTIVE SEED AND SENTINEL LYMPH NODE BIOPSY Left 09/07/2018   Procedure: LEFT BREAST LUMPECTOMY WITH RADIOACTIVE SEED AND AXILLARY SENTINEL LYMPH NODE BIOPSY,INJECT BLUE DYE LEFT BREAST;  Surgeon: Boyce Byes, MD;  Location: [facility] OR;  Service: General;  Laterality: Left;   CATARACT EXTRACTION Bilateral 02/2005   CHOLECYSTECTOMY  09/26/2011   Procedure: LAPAROSCOPIC CHOLECYSTECTOMY WITH INTRAOPERATIVE CHOLANGIOGRAM;  Surgeon: Darcella Earnest, MD;  Location: [facility] OR;  Service: General;  Laterality: N/A;   COLONOSCOPY     CYSTIC HYGROMA EXCISION     DENTAL SURGERY     skin graft from top of mouth to lower gums   GALLBLADDER SURGERY  11/2016   HYSTEROTOMY  05/2012   PORT-A-CATH REMOVAL Right 11/04/2019   Procedure: REMOVAL PORT-A-CATH;  Surgeon: Dareen Ebbing, MD;  Location: [facility];  Service: General;  Laterality:  Right;   PORTACATH PLACEMENT N/A 09/07/2018   Procedure: INSERTION PORT-A-CATH WITH US ;  Surgeon: Boyce Byes, MD;  Location: [facility] OR;  Service: General;  Laterality: N/A;   REPLACEMENT TOTAL KNEE Right    Patient Active Problem List   Diagnosis Date Noted   Depression 01/02/2021   Metabolic syndrome 01/02/2021   Pes anserine bursitis 01/03/2020   Degenerative joint disease of knee, right 01/03/2020   Morbid obesity with BMI of 40.0-44.9, adult (HCC) 02/11/2019   Port-A-Cath in place 10/30/2018   Malignant neoplasm of upper-inner quadrant of left breast in female, estrogen receptor positive (HCC) 08/11/2018   Prediabetes 02/24/2018   Varicose veins of legs 06/03/2017   Mixed hyperlipidemia 01/08/2010   Attention deficit disorder 04/14/2009   Hypothyroidism 04/03/2009   Major depressive disorder, recurrent episode (HCC) 04/03/2009   Essential hypertension 04/03/2009    PCP: Marquetta Sit, MD   REFERRING PROVIDER: Marquetta Sit, MD   REFERRING DIAG: M54.40 (ICD-10-CM) - Bilateral low back pain with sciatica, sciatica laterality unspecified, unspecified chronicity   Rationale for Evaluation and Treatment: Rehabilitation  THERAPY DIAG:  No diagnosis found.  ONSET DATE: Chronic  SUBJECTIVE:  SUBJECTIVE STATEMENT: Pt presents to PT with reports of increased back pain after pulling weeds over weekend. Has continued HEP compliance.   EVAL: Pt presents to PT with reports of chronic LBP that occasionally refers into bilateral hip. Denies N/T into LE except occasional foot numbness bilaterally. No trauma or MOI, notes forward flexion increases pain and lying supine helps. Also denies bowel/bladder changes or saddle anesthesia.   PERTINENT HISTORY:  HTN, R TKA  PAIN:  Are you having pain?   Yes: NPRS scale: 6/10 Worst: 8/10 Pain location: lower back, bilateral hip Pain description: sharp, tight Aggravating factors: morning, bending forward, sweeping Relieving factors: medication  PRECAUTIONS: None  RED FLAGS: None   WEIGHT BEARING RESTRICTIONS: No  FALLS:  Has patient fallen in last 6 months? Yes. Number of falls - mechanical fall while in the yard  LIVING ENVIRONMENT: Lives with: lives alone Lives in: House/apartment  OCCUPATION: Retired  PLOF: Independent  PATIENT GOALS: improve comfort with ADLs, decrease back pain  OBJECTIVE:  Note: Objective measures were completed at Evaluation unless otherwise noted.  DIAGNOSTIC FINDINGS:  N/A  PATIENT SURVEYS:  Modified Oswestry: 27/50 - 54% disability 02/23/2024: 24/50 48% disability   COGNITION: Overall cognitive status: Within functional limits for tasks assessed     SENSATION: WFL  POSTURE: rounded shoulders, forward head, and increased lumbar lordosis  PALPATION: Slight TTP to bilateral lumbar paraspinals  LUMBAR ROM:   AROM eval  Flexion 75%  Extension 50%  Right lateral flexion   Left lateral flexion   Right rotation   Left rotation    (Blank rows = not tested)  LOWER EXTREMITY MMT:    MMT Right eval Left eval  Hip flexion 3+/5 3+/5  Hip extension    Hip abduction 3+/5 3+/5  Hip adduction    Hip internal rotation    Hip external rotation    Knee flexion 5/5 5/5  Knee extension 5/5 5/5  Ankle dorsiflexion    Ankle plantarflexion    Ankle inversion    Ankle eversion     (Blank rows = not tested)  LUMBAR SPECIAL TESTS:  Straight leg raise test: Negative and Slump test: Positive  FUNCTIONAL TESTS:  Five Time Sit to Stand: 36 seconds with UE  GAIT: Distance walked: 79ft Assistive device utilized: None Level of assistance: Complete Independence Comments: trunk flexed, decreased gait speed  TREATMENT: [facility] Adult PT Treatment:                                                 DATE: 02/23/24 Therapeutic Activity: LTR x 10 STS 2x10 - low table with out UE Assessment of tests/meaures, goals, and outcomes for progress note Therapeutic Exercise: Supine PPT x 10 - 5" hold Supine PPT with ball 2x10  Hooklying clamshell 3x15 blue band Hooklying march with blue band 2x20 Supine pilates SLR 2x10 each  [facility] Adult PT Treatment:                                                DATE: 02/19/24 Therapeutic Activity: Nustep L5 UE/LE x 3 minutes for functional activity tolerance LTR x 10 STS 2x10 - low table with out UE Standing lumbar extension at counter x 10  Standing hip  abduction YTB 2x10 each  Therapeutic Exercise: Supine PPT x 10 - 5" hold Supine PPT with ball 2x10  Hooklying clamshell 3x15 GTB Supine pilates SLR 2x10 each  [facility] Adult PT Treatment:                                                DATE: 02/13/24 Therapeutic Activity: Nustep L5 UE/LE x 5 minutes for functional activity tolerance STS 2x10 - low table with out UE Standing lumbar extension at counter x 10  Standing hip abduction YTB x 10 each  STS x 5  18.4 sec  Heel raises  Therapeutic Exercise: SKTC PPT to Bridge 10 x 2  Supine PPT x 10 - 5" hold Supine PPT with ball 2x10  Gastroc stretch at counter     [facility] Adult PT Treatment:                                                DATE: 02/05/24 Therapeutic Activity: Nustep L5 UE/LE x 5 minutes for functional activity tolerance STS 2x10 - low table with out UE Seated repeated flexion with physioball 1 x 15  SKTC  Therapeutic Exercise: Supine pilates SLR 2x10 each PPT to Bridge 10 x 2  Hooklying clamshell 3x15 black band Supine PPT x 10 - 5" hold Supine PPT with ball 2x10  LAQ 2x10 3#  Modalities: Ice to lumbar spine during supine exercises   [facility] Adult PT Treatment:                                                DATE: 01/28/24 Therapeutic Activity: Nustep L5 UE/LE x 5 minutes for functional activity tolerance STS 2x10 - low table with  UE Seated repeated flexion with physioball 2x15 Therapeutic Exercise: Supine pilates SLR 2x10 each Pilates bridge 2x10 Hooklying clamshell 3x15 black band Supine PPT x 10 - 5" hold Supine PPT with ball 2x10  Standing hip abduction 10 x 2  Standing heel raises x 15 Gastroc stretch  Modalities: Ice to lumbar spine during supine exercises  PATIENT EDUCATION:  Education details: continue HEP Person educated: Patient Education method: Explanation, Demonstration, and Handouts Education comprehension: verbalized understanding and returned demonstration  HOME EXERCISE PROGRAM: Access Code: [email or web address] URL: [email or web address] Date: 01/28/2024 Prepared by: Loral Roch  Exercises - Seated Sciatic Tensioner  - 1 x daily - 7 x weekly - 2 sets - 15 reps - Supine Posterior Pelvic Tilt  - 1 x daily - 7 x weekly - 2 sets - 10 reps - 5 sec hold - Supine Bridge  - 1 x daily - 7 x weekly - 2 sets - 10 reps - Hooklying Clamshell with Resistance  - 1 x daily - 7 x weekly - 2-3 sets - 15 reps - blue band hold - Standing Lumbar Extension with Counter  - 1 x daily - 7 x weekly - 2 sets - 10 reps - Hooklying Single Knee to Chest  - 1 x daily - 7 x weekly - 1 sets - 3 reps - 20 hold - Sit to Stand  - 1 x daily -  7 x weekly - 2 sets - 10 reps - Standing Hip Abduction with Counter Support  - 1 x daily - 7 x weekly - 2 sets - 10 reps - Standing Heel Raise with Support  - 1 x daily - 7 x weekly - 2 sets - 10 reps - Gastroc Stretch on Wall  - 1 x daily - 7 x weekly - 1 sets - 3 reps - 20 hold   ASSESSMENT:  CLINICAL IMPRESSION: Pt was able to complete all prescribed exercises with no adverse effect. Exercises today focused on continued core and proximal hip strengthening as well as exercises with purpose on improving functional mobility and activity tolerance. ODI score showed decrease today showing subjective improvement in functional ability. Continues to have weakness and limited  mobility, but does continue to benefit from therapy. Will continue to progress as able per POC.   EVAL: Patient is a 68 y.o. F who was seen today for physical therapy evaluation and treatment for chronic LBP. Physical findings are consistent with MD impression as pt demonstrates core and proximal hip weakness.  ODI demonstrates severe disability in performance of home ADLs and community activities. Pt would benefit from skilled PT services working on improving core and hip strength in order to decrease pain and improve comfort.   OBJECTIVE IMPAIRMENTS: decreased activity tolerance, decreased balance, decreased mobility, difficulty walking, decreased ROM, decreased strength, postural dysfunction, and pain   ACTIVITY LIMITATIONS: carrying, lifting, sitting, standing, squatting, stairs, transfers, and locomotion level  PARTICIPATION LIMITATIONS: driving, shopping, community activity, occupation, and yard work  PERSONAL FACTORS: Time since onset of injury/illness/exacerbation and 1-2 comorbidities: HTN, R TKA are also affecting patient's functional outcome.   REHAB POTENTIAL: Good  CLINICAL DECISION MAKING: Stable/uncomplicated  EVALUATION COMPLEXITY: Low   GOALS: Goals reviewed with patient? No  SHORT TERM GOALS: Target date: 01/15/2024   Pt will be compliant and knowledgeable with initial HEP for improved comfort and carryover Baseline: initial HEP given  Goal status: MET  2.  Pt will self report low back pain no greater than 5/10 for improved comfort and functional ability Baseline: 8/10 at worst Goal status: MET   LONG TERM GOALS: Target date: 04/01/2024   Pt will be decrease ODI disability score to no greater than 40% (20/50) as proxy for functional improvement Baseline: 54% disability (27/50) 02/23/2024: 24/50 48% disability  Goal status: ONGOING  2.  Pt will self report low back pain no greater than 1-2/10 for improved comfort and functional ability Baseline: 8/10 at  worst Goal status: IN PROGRESS   3.  Pt will decrease Five Time Sit to Stand time to no less than 22 seconds for improved balance, strength, and functional mobility Baseline: 36 seconds with UE 02/13/24: 18.4 sec  Goal status: MET     4.  Pt will improve bilateral LE MMT to no less than 4/5 for improved functional mobility and decreased back pain Baseline:  Goal status: ONGOING  PLAN:  PT FREQUENCY: 2x/week  PT DURATION: 8 weeks  PLANNED INTERVENTIONS: [id number]- PT Re-evaluation, 97110-Therapeutic exercises, 97530- Therapeutic activity, [id number]- Neuromuscular re-education, 97535- Self Care, [id number]- Manual therapy, [id number]- Gait training, [id number]- Electrical stimulation (unattended), [id number]- Electrical stimulation (manual), Dry Needling, Cryotherapy, and Moist heat.  PLAN FOR NEXT SESSION: assess HEP response, neutral spine core and hip strengthening   Ivor Mars PT  03/02/24 7:59 AM

## 2024-03-04 ENCOUNTER — Encounter: Payer: Self-pay | Admitting: Physical Therapy

## 2024-03-04 ENCOUNTER — Ambulatory Visit: Admitting: Physical Therapy

## 2024-03-04 DIAGNOSIS — R2689 Other abnormalities of gait and mobility: Secondary | ICD-10-CM

## 2024-03-04 DIAGNOSIS — M5459 Other low back pain: Secondary | ICD-10-CM

## 2024-03-04 DIAGNOSIS — M6281 Muscle weakness (generalized): Secondary | ICD-10-CM

## 2024-03-04 NOTE — Therapy (Addendum)
 OUTPATIENT PHYSICAL THERAPY TREATMENT/DISCHARGE   PHYSICAL THERAPY DISCHARGE SUMMARY  Visits from Start of Care: 11  Current functional level related to goals / functional outcomes: See goals and objective   Remaining deficits: See goals and objective   Education / Equipment: HEP   Patient agrees to discharge. Patient goals were partially met. Patient is being discharged due to maximized rehab potential.    Patient Name: Tiffany Montes MRN: [id number] DOB:08/12/1956, 68 y.o., female Today's Date: 03/04/2024  END OF SESSION:  PT End of Session - 03/04/24 0845     Visit Number 11    Number of Visits 17    Date for PT Re-Evaluation 04/01/24    Authorization Type MCR    PT Start Time 0845    PT Stop Time 0925    PT Time Calculation (min) 40 min                   Past Medical History:  Diagnosis Date   ADHD    Anxiety    Attention deficit disorder    Back pain    Breast cancer (HCC) 2019   Left Breast Cancer   Depression    Gallbladder problem    Gallstones 09/05/2011   Hyperlipidemia    Hypertension    Hypothyroidism    Joint pain    Migraines    Neuropathy    Personal history of chemotherapy 2019   Left Breast Cancer   Personal history of radiation therapy 2019   Left Breast Cancer   PONV (postoperative nausea and vomiting)    diffficulty waking up   Spinal stenosis    Thyroid  disease    hypothyroid   Past Surgical History:  Procedure Laterality Date   ABDOMINAL HYSTERECTOMY  2003   BREAST BIOPSY Left 2014   benign   BREAST LUMPECTOMY Left 09/07/2018   BREAST LUMPECTOMY WITH RADIOACTIVE SEED AND SENTINEL LYMPH NODE BIOPSY Left 09/07/2018   Procedure: LEFT BREAST LUMPECTOMY WITH RADIOACTIVE SEED AND AXILLARY SENTINEL LYMPH NODE BIOPSY,INJECT BLUE DYE LEFT BREAST;  Surgeon: Boyce Byes, MD;  Location: [facility] OR;  Service: General;  Laterality: Left;   CATARACT EXTRACTION Bilateral 02/2005   CHOLECYSTECTOMY  09/26/2011   Procedure:  LAPAROSCOPIC CHOLECYSTECTOMY WITH INTRAOPERATIVE CHOLANGIOGRAM;  Surgeon: Darcella Earnest, MD;  Location: [facility] OR;  Service: General;  Laterality: N/A;   COLONOSCOPY     CYSTIC HYGROMA EXCISION     DENTAL SURGERY     skin graft from top of mouth to lower gums   GALLBLADDER SURGERY  11/2016   HYSTEROTOMY  05/2012   PORT-A-CATH REMOVAL Right 11/04/2019   Procedure: REMOVAL PORT-A-CATH;  Surgeon: Dareen Ebbing, MD;  Location: [facility];  Service: General;  Laterality: Right;   PORTACATH PLACEMENT N/A 09/07/2018   Procedure: INSERTION PORT-A-CATH WITH US ;  Surgeon: Boyce Byes, MD;  Location: [facility] OR;  Service: General;  Laterality: N/A;   REPLACEMENT TOTAL KNEE Right    Patient Active Problem List   Diagnosis Date Noted   Depression 01/02/2021   Metabolic syndrome 01/02/2021   Pes anserine bursitis 01/03/2020   Degenerative joint disease of knee, right 01/03/2020   Morbid obesity with BMI of 40.0-44.9, adult (HCC) 02/11/2019   Port-A-Cath in place 10/30/2018   Malignant neoplasm of upper-inner quadrant of left breast in female, estrogen receptor positive (HCC) 08/11/2018   Prediabetes 02/24/2018   Varicose veins of legs 06/03/2017   Mixed hyperlipidemia 01/08/2010   Attention deficit disorder 04/14/2009   Hypothyroidism 04/03/2009  Major depressive disorder, recurrent episode (HCC) 04/03/2009   Essential hypertension 04/03/2009    PCP: Marquetta Sit, MD   REFERRING PROVIDER: Marquetta Sit, MD   REFERRING DIAG: M54.40 (ICD-10-CM) - Bilateral low back pain with sciatica, sciatica laterality unspecified, unspecified chronicity   Rationale for Evaluation and Treatment: Rehabilitation  THERAPY DIAG:  Other low back pain  Muscle weakness (generalized)  Other abnormalities of gait and mobility  ONSET DATE: Chronic  SUBJECTIVE:                                                                                                                                                                                            SUBJECTIVE STATEMENT: I took two Aleve. I am walking better. I know how to manage my pain. I use my exercises to help the pain.   EVAL: Pt presents to PT with reports of chronic LBP that occasionally refers into bilateral hip. Denies N/T into LE except occasional foot numbness bilaterally. No trauma or MOI, notes forward flexion increases pain and lying supine helps. Also denies bowel/bladder changes or saddle anesthesia.   PERTINENT HISTORY:  HTN, R TKA  PAIN:  Are you having pain?  Yes: NPRS scale: 6/10 Worst: 6-7/10 Pain location: lower back, bilateral hip Pain description: sharp, tight Aggravating factors: morning, bending forward, sweeping Relieving factors: medication  PRECAUTIONS: None  RED FLAGS: None   WEIGHT BEARING RESTRICTIONS: No  FALLS:  Has patient fallen in last 6 months? Yes. Number of falls - mechanical fall while in the yard  LIVING ENVIRONMENT: Lives with: lives alone Lives in: House/apartment  OCCUPATION: Retired  PLOF: Independent  PATIENT GOALS: improve comfort with ADLs, decrease back pain  OBJECTIVE:  Note: Objective measures were completed at Evaluation unless otherwise noted.  DIAGNOSTIC FINDINGS:  N/A  PATIENT SURVEYS:  Modified Oswestry: 27/50 - 54% disability 02/23/2024: 24/50 48% disability   COGNITION: Overall cognitive status: Within functional limits for tasks assessed     SENSATION: WFL  POSTURE: rounded shoulders, forward head, and increased lumbar lordosis  PALPATION: Slight TTP to bilateral lumbar paraspinals  LUMBAR ROM:   AROM eval 03/04/24  Flexion 75% 100%  Extension 50%   Right lateral flexion    Left lateral flexion    Right rotation    Left rotation     (Blank rows = not tested)  LOWER EXTREMITY MMT:    MMT Right eval Left eval Right  03/04/24 Left  03/04/24  Hip flexion 3+/5 3+/5 4/5 4/5  Hip extension      Hip abduction 3+/5 3+/5 4-/5  4-/5  Hip adduction  Hip internal rotation      Hip external rotation      Knee flexion 5/5 5/5    Knee extension 5/5 5/5    Ankle dorsiflexion      Ankle plantarflexion      Ankle inversion      Ankle eversion       (Blank rows = not tested)  LUMBAR SPECIAL TESTS:  Straight leg raise test: Negative and Slump test: Positive  FUNCTIONAL TESTS:  Five Time Sit to Stand: 36 seconds with UE  GAIT: Distance walked: 44ft Assistive device utilized: None Level of assistance: Complete Independence Comments: trunk flexed, decreased gait speed  TREATMENT: [facility] Adult PT Treatment:                                                DATE: 03/04/24 Review of HEP   [facility] Adult PT Treatment:                                                DATE: 02/23/24 Therapeutic Activity: LTR x 10 STS 2x10 - low table with out UE Assessment of tests/meaures, goals, and outcomes for progress note Therapeutic Exercise: Supine PPT x 10 - 5" hold Supine PPT with ball 2x10  Hooklying clamshell 3x15 blue band Hooklying march with blue band 2x20 Supine pilates SLR 2x10 each  [facility] Adult PT Treatment:                                                DATE: 02/19/24 Therapeutic Activity: Nustep L5 UE/LE x 3 minutes for functional activity tolerance LTR x 10 STS 2x10 - low table with out UE Standing lumbar extension at counter x 10  Standing hip abduction YTB 2x10 each  Therapeutic Exercise: Supine PPT x 10 - 5" hold Supine PPT with ball 2x10  Hooklying clamshell 3x15 GTB Supine pilates SLR 2x10 each  [facility] Adult PT Treatment:                                                DATE: 02/13/24 Therapeutic Activity: Nustep L5 UE/LE x 5 minutes for functional activity tolerance STS 2x10 - low table with out UE Standing lumbar extension at counter x 10  Standing hip abduction YTB x 10 each  STS x 5  18.4 sec  Heel raises  Therapeutic Exercise: SKTC PPT to Bridge 10 x 2  Supine PPT x 10 - 5" hold Supine PPT with  ball 2x10  Gastroc stretch at counter     [facility] Adult PT Treatment:                                                DATE: 02/05/24 Therapeutic Activity: Nustep L5 UE/LE x 5 minutes for functional activity tolerance STS 2x10 - low table with out UE Seated repeated  flexion with physioball 1 x 15  SKTC  Therapeutic Exercise: Supine pilates SLR 2x10 each PPT to Bridge 10 x 2  Hooklying clamshell 3x15 black band Supine PPT x 10 - 5" hold Supine PPT with ball 2x10  LAQ 2x10 3#  Modalities: Ice to lumbar spine during supine exercises   [facility] Adult PT Treatment:                                                DATE: 01/28/24 Therapeutic Activity: Nustep L5 UE/LE x 5 minutes for functional activity tolerance STS 2x10 - low table with UE Seated repeated flexion with physioball 2x15 Therapeutic Exercise: Supine pilates SLR 2x10 each Pilates bridge 2x10 Hooklying clamshell 3x15 black band Supine PPT x 10 - 5" hold Supine PPT with ball 2x10  Standing hip abduction 10 x 2  Standing heel raises x 15 Gastroc stretch  Modalities: Ice to lumbar spine during supine exercises  PATIENT EDUCATION:  Education details: continue HEP Person educated: Patient Education method: Explanation, Demonstration, and Handouts Education comprehension: verbalized understanding and returned demonstration  HOME EXERCISE PROGRAM: Access Code: [email or web address] URL: [email or web address] Date: 01/28/2024 Prepared by: Loral Roch  Exercises - Seated Sciatic Tensioner  - 1 x daily - 7 x weekly - 2 sets - 15 reps - Supine Posterior Pelvic Tilt  - 1 x daily - 7 x weekly - 2 sets - 10 reps - 5 sec hold - Supine Bridge  - 1 x daily - 7 x weekly - 2 sets - 10 reps - Hooklying Clamshell with Resistance  - 1 x daily - 7 x weekly - 2-3 sets - 15 reps - blue band hold - Standing Lumbar Extension with Counter  - 1 x daily - 7 x weekly - 2 sets - 10 reps - Hooklying Single Knee to Chest  - 1 x daily - 7 x weekly -  1 sets - 3 reps - 20 hold - Sit to Stand  - 1 x daily - 7 x weekly - 2 sets - 10 reps - Standing Hip Abduction with Counter Support  - 1 x daily - 7 x weekly - 2 sets - 10 reps - Standing Heel Raise with Support  - 1 x daily - 7 x weekly - 2 sets - 10 reps - Gastroc Stretch on Wall  - 1 x daily - 7 x weekly - 1 sets - 3 reps - 20 hold   ASSESSMENT:  CLINICAL IMPRESSION: Pt reports overall improved pain intensity and more good days than bad days. Her AROM lumbar and hip MMT have improved, partially meeting some LTGs. 5 x STS also improved, meeting that LTG. She has completed 6 weeks of PT and although she has more time in her POC pt is requesting to discharge to HEP. She did have improvement in her ODI score as well. She verbalizes improvements in managing her pain through modifications and pacing strategies. She will return with new referral in the future if needed. See LTG section.    EVAL: Patient is a 68 y.o. F who was seen today for physical therapy evaluation and treatment for chronic LBP. Physical findings are consistent with MD impression as pt demonstrates core and proximal hip weakness.  ODI demonstrates severe disability in performance of home ADLs and community activities. Pt would benefit from skilled PT services working on improving  core and hip strength in order to decrease pain and improve comfort.   OBJECTIVE IMPAIRMENTS: decreased activity tolerance, decreased balance, decreased mobility, difficulty walking, decreased ROM, decreased strength, postural dysfunction, and pain   ACTIVITY LIMITATIONS: carrying, lifting, sitting, standing, squatting, stairs, transfers, and locomotion level  PARTICIPATION LIMITATIONS: driving, shopping, community activity, occupation, and yard work  PERSONAL FACTORS: Time since onset of injury/illness/exacerbation and 1-2 comorbidities: HTN, R TKA are also affecting patient's functional outcome.   REHAB POTENTIAL: Good  CLINICAL DECISION MAKING:  Stable/uncomplicated  EVALUATION COMPLEXITY: Low   GOALS: Goals reviewed with patient? No  SHORT TERM GOALS: Target date: 01/15/2024   Pt will be compliant and knowledgeable with initial HEP for improved comfort and carryover Baseline: initial HEP given  Goal status: MET  2.  Pt will self report low back pain no greater than 5/10 for improved comfort and functional ability Baseline: 8/10 at worst Goal status: MET   LONG TERM GOALS: Target date: 04/01/2024   Pt will be decrease ODI disability score to no greater than 40% (20/50) as proxy for functional improvement Baseline: 54% disability (27/50) 02/23/2024: 24/50 48% disability  Goal status: NOT MET   2.  Pt will self report low back pain no greater than 1-2/10 for improved comfort and functional ability Baseline: 8/10 at worst 03/04/24: 6-7/10 at worst, less often, take less Aleve overall Goal status: NOT MET   3.  Pt will decrease Five Time Sit to Stand time to no less than 22 seconds for improved balance, strength, and functional mobility Baseline: 36 seconds with UE 02/13/24: 18.4 sec  Goal status: MET     4.  Pt will improve bilateral LE MMT to no less than 4/5 for improved functional mobility and decreased back pain Baseline:  03/04/24: see chart  Goal status: PARTIALLY MET  PLAN:  PT FREQUENCY: 2x/week  PT DURATION: 8 weeks  PLANNED INTERVENTIONS: 97164- PT Re-evaluation, 97110-Therapeutic exercises, 97530- Therapeutic activity, 97112- Neuromuscular re-education, 97535- Self Care, 56213- Manual therapy, [id number]- Gait training, Y8657- Electrical stimulation (unattended), [id number]- Electrical stimulation (manual), Dry Needling, Cryotherapy, and Moist heat.  PLAN FOR NEXT SESSION:N/A DC to HEP today   Susana Enter PTA  03/04/24 9:23 AM

## 2024-03-23 ENCOUNTER — Other Ambulatory Visit: Payer: Self-pay | Admitting: Family Medicine

## 2024-03-23 ENCOUNTER — Other Ambulatory Visit: Payer: Self-pay | Admitting: Hematology and Oncology

## 2024-03-23 DIAGNOSIS — I10 Essential (primary) hypertension: Secondary | ICD-10-CM

## 2024-03-23 DIAGNOSIS — R059 Cough, unspecified: Secondary | ICD-10-CM

## 2024-04-01 ENCOUNTER — Other Ambulatory Visit: Payer: Self-pay | Admitting: Family Medicine

## 2024-04-01 NOTE — Telephone Encounter (Signed)
 Copied from CRM [id number]. Topic: Clinical - Medication Refill >> Apr 01, 2024  4:55 PM Zebedee SAUNDERS wrote: Medication: AMB amphetamine -dextroamphetamine  (ADDERALL ) 30 MG tablet  Has the patient contacted their pharmacy? Yes (Agent: If no, request that the patient contact the pharmacy for the refill. If patient does not wish to contact the pharmacy document the reason why and proceed with request.) (Agent: If yes, when and what did the pharmacy advise?)  This is the patient's preferred pharmacy:  [id number] - [place], [place] - [address] AT [address] OF [facility] [address]  [place] [address] Phone: [phone] Fax: [phone]  Is this the correct pharmacy for this prescription? Yes If no, delete pharmacy and type the correct one.   Has the prescription been filled recently? Yes  Is the patient out of the medication? Yes  Has the patient been seen for an appointment in the last year OR does the patient have an upcoming appointment? Yes  Can we respond through MyChart? Yes  Agent: Please be advised that Rx refills may take up to 3 business days. We ask that you follow-up with your pharmacy.

## 2024-04-04 MED ORDER — AMPHETAMINE-DEXTROAMPHETAMINE 30 MG PO TABS: 30.0000 mg | ORAL_TABLET | Freq: Every day | ORAL | 0 refills | Status: DC

## 2024-04-21 ENCOUNTER — Other Ambulatory Visit: Payer: Self-pay

## 2024-04-21 DIAGNOSIS — I10 Essential (primary) hypertension: Secondary | ICD-10-CM

## 2024-04-21 MED ORDER — LOSARTAN POTASSIUM-HCTZ 100-12.5 MG PO TABS
1.0000 | ORAL_TABLET | Freq: Every day | ORAL | 0 refills | Status: AC
Start: 2024-04-21 — End: ?

## 2024-04-21 MED ORDER — SERTRALINE HCL 100 MG PO TABS: 100.0000 mg | ORAL_TABLET | Freq: Every day | ORAL | 0 refills | Status: DC

## 2024-04-21 MED ORDER — BUPROPION HCL ER (XL) 300 MG PO TB24: 300.0000 mg | ORAL_TABLET | Freq: Every day | ORAL | 0 refills | Status: DC

## 2024-04-21 MED ORDER — LEVOTHYROXINE SODIUM 150 MCG PO TABS: ORAL_TABLET | ORAL | 0 refills | Status: DC

## 2024-05-07 ENCOUNTER — Other Ambulatory Visit: Payer: Self-pay | Admitting: Family Medicine

## 2024-05-07 DIAGNOSIS — F3289 Other specified depressive episodes: Secondary | ICD-10-CM

## 2024-05-07 DIAGNOSIS — R059 Cough, unspecified: Secondary | ICD-10-CM

## 2024-05-07 NOTE — Telephone Encounter (Signed)
 Copied from CRM [id number]. Topic: Clinical - Medication Refill >> May 07, 2024  3:58 PM Drema MATSU wrote: Medication: ALPRAZolam  (XANAX ) 0.5 MG tablet,, amphetamine -dextroamphetamine  (ADDERALL ) 30 MG tablet, benzonatate  (TESSALON ) 100 MG capsule  Has the patient contacted their pharmacy? Yes (Agent: If no, request that the patient contact the pharmacy for the refill. If patient does not wish to contact the pharmacy document the reason why and proceed with request.) advised to call provider (Agent: If yes, when and what did the pharmacy advise?)  [facility] the [facility] [address]  Is this the correct pharmacy for this prescription? Yes If no, delete pharmacy and type the correct one.   Has the prescription been filled recently? Yes  Is the patient out of the medication? Yes only a few alprazolam  left   Has the patient been seen for an appointment in the last year OR does the patient have an upcoming appointment? Yes  Can we respond through MyChart? Yes  Agent: Please be advised that Rx refills may take up to 3 business days. We ask that you follow-up with your pharmacy.

## 2024-05-09 MED ORDER — ALPRAZOLAM 0.5 MG PO TABS
ORAL_TABLET | ORAL | 5 refills | Status: DC
Start: 2024-05-09 — End: 2024-06-21

## 2024-05-09 MED ORDER — AMPHETAMINE-DEXTROAMPHETAMINE 30 MG PO TABS: 30.0000 mg | ORAL_TABLET | Freq: Every day | ORAL | 0 refills | Status: DC

## 2024-05-24 ENCOUNTER — Other Ambulatory Visit: Payer: Self-pay | Admitting: Family Medicine

## 2024-06-21 ENCOUNTER — Other Ambulatory Visit: Payer: Self-pay | Admitting: Family Medicine

## 2024-06-21 DIAGNOSIS — F3289 Other specified depressive episodes: Secondary | ICD-10-CM

## 2024-06-21 NOTE — Telephone Encounter (Signed)
 Copied from CRM [id number]. Topic: Clinical - Medication Refill >> Jun 21, 2024  4:47 PM Leah C wrote: Medication: ALPRAZolam  (XANAX ) 0.5 MG tablet, amphetamine -dextroamphetamine  (ADDERALL ) 30 MG tablet  Has the patient contacted their pharmacy? Yes and was told to contact us   (Agent: If no, request that the patient contact the pharmacy for the refill. If patient does not wish to contact the pharmacy document the reason why and proceed with request.) (Agent: If yes, when and what did the pharmacy advise?)  This is the patient's preferred pharmacy:  [id number] - [place], [place] - [address] AT [facility] [address]  [place] [address] Phone: [phone] Fax: [phone]  Is this the correct pharmacy for this prescription? Yes  If no, delete pharmacy and type the correct one.   Has the prescription been filled recently? Yes  Is the patient out of the medication? Yes  Has the patient been seen for an appointment in the last year OR does the patient have an upcoming appointment? Yes  Can we respond through MyChart? Yes  Agent: Please be advised that Rx refills may take up to 3 business days. We ask that you follow-up with your pharmacy.

## 2024-06-22 MED ORDER — ALPRAZOLAM 0.5 MG PO TABS: ORAL_TABLET | ORAL | 5 refills | Status: DC

## 2024-06-22 MED ORDER — AMPHETAMINE-DEXTROAMPHETAMINE 30 MG PO TABS: 30.0000 mg | ORAL_TABLET | Freq: Every day | ORAL | 0 refills | Status: DC

## 2024-06-25 ENCOUNTER — Other Ambulatory Visit: Payer: Self-pay | Admitting: Family Medicine

## 2024-06-28 ENCOUNTER — Ambulatory Visit (INDEPENDENT_AMBULATORY_CARE_PROVIDER_SITE_OTHER)

## 2024-06-28 VITALS — Ht 62.0 in | Wt 190.0 lb

## 2024-06-28 DIAGNOSIS — Z0000 Encounter for general adult medical examination without abnormal findings: Secondary | ICD-10-CM | POA: Diagnosis not present

## 2024-06-28 NOTE — Progress Notes (Signed)
 Subjective:   Tiffany Montes is a 68 y.o. who presents for a Medicare Wellness preventive visit.  As a reminder, Annual Wellness Visits don't include a physical exam, and some assessments may be limited, especially if this visit is performed virtually. We may recommend an in-person follow-up visit with your provider if needed.  Visit Complete: Virtual I connected with  Tiffany Montes on 06/28/24 by a audio enabled telemedicine application and verified that I am speaking with the correct person using two identifiers.  Patient Location: Home  Provider Location: Home Office  I discussed the limitations of evaluation and management by telemedicine. The patient expressed understanding and agreed to proceed.  Vital Signs: Because this visit was a virtual/telehealth visit, some criteria may be missing or patient reported. Any vitals not documented were not able to be obtained and vitals that have been documented are patient reported.    Persons Participating in Visit: Patient.  AWV Questionnaire: No: Patient Medicare AWV questionnaire was not completed prior to this visit.  Cardiac Risk Factors include: advanced age (>42men, >81 women);hypertension     Objective:    Today's Vitals   06/28/24 1503  Weight: 190 lb (86.2 kg)  Height: 5' 2 (1.575 m)   Body mass index is 34.75 kg/m.     06/28/2024    3:14 PM 12/25/2023    8:01 AM 06/23/2023   10:48 AM 05/13/2022   10:48 AM 04/24/2021    9:56 AM 11/04/2019    1:42 PM 10/29/2019    4:18 PM  Advanced Directives  Does Patient Have a Medical Advance Directive? Yes No Yes Yes Yes Yes Yes  Type of Estate agent of [place];Living will  Healthcare Power of [place];Living will Healthcare Power of [place];Living will Healthcare Power of [place];Living will Living will Living will  Does patient want to make changes to medical advance directive?      No - Patient declined No - Patient declined  Copy of  Healthcare Power of Attorney in Chart? No - copy requested  No - copy requested No - copy requested No - copy requested No - copy requested No - copy requested    Current Medications (verified) Outpatient Encounter Medications as of 06/28/2024  Medication Sig   ALPRAZolam  (XANAX ) 0.5 MG tablet TAKE 1 TABLET BY MOUTH AT BEDTIME AS NEEDED FOR ANXIETY   amphetamine -dextroamphetamine  (ADDERALL ) 30 MG tablet Take 1 tablet by mouth daily.   amphetamine -dextroamphetamine  (ADDERALL ) 30 MG tablet Take 1 tablet by mouth daily.   benzonatate  (TESSALON ) 100 MG capsule TAKE 1 CAPSULE BY MOUTH 2 TIMES DAILY AS NEEDED FOR COUGH   betamethasone  dipropionate 0.05 % cream APPLY TOPICALLY TWICE A DAY   buPROPion  (WELLBUTRIN  XL) 300 MG 24 hr tablet Take 1 tablet (300 mg total) by mouth daily.   fluticasone  (FLONASE ) 50 MCG/ACT nasal spray SPRAY 2 SPRAYS INTO EACH NOSTRIL DAILY AS NEEDED FOR ALLERGIES   gabapentin  (NEURONTIN ) 300 MG capsule TAKE 1 CAPSULE BY MOUTH EVERYDAY AT BEDTIME   levothyroxine  (SYNTHROID ) 150 MCG tablet TAKE 1 TABLET BY MOUTH EVERY DAY BEFORE BREAKFAST   losartan -hydrochlorothiazide  (HYZAAR ) 100-12.5 MG tablet Take 1 tablet by mouth daily.   sertraline  (ZOLOFT ) 100 MG tablet Take 1 tablet (100 mg total) by mouth daily.   SUMAtriptan  (IMITREX ) 100 MG tablet TAKE 1 TABLET BY MOUTH AS NEEDED FOR MIGRAINE *MAY REPEAT IN 24 HOURS AS DIRECTED   tamoxifen  (NOLVADEX ) 20 MG tablet TAKE 1 TABLET BY MOUTH EVERY DAY   trimethoprim -polymyxin b  (  POLYTRIM ) ophthalmic solution Place 2 drops into the right eye every 4 (four) hours.   Vitamin D -Vitamin K (VITAMIN K2-VITAMIN D3 PO) Take 5,000 Units by mouth.   [DISCONTINUED] prochlorperazine  (COMPAZINE ) 10 MG tablet Take 1 tablet (10 mg total) by mouth every 6 (six) hours as needed (Nausea or vomiting).   No facility-administered encounter medications on file as of 06/28/2024.    Allergies (verified) Morphine sulfate   History: Past Medical History:   Diagnosis Date   ADHD    Anxiety    Attention deficit disorder    Back pain    Breast cancer (HCC) 2019   Left Breast Cancer   Depression    Gallbladder problem    Gallstones 09/05/2011   Hyperlipidemia    Hypertension    Hypothyroidism    Joint pain    Migraines    Neuropathy    Personal history of chemotherapy 2019   Left Breast Cancer   Personal history of radiation therapy 2019   Left Breast Cancer   PONV (postoperative nausea and vomiting)    diffficulty waking up   Spinal stenosis    Thyroid  disease    hypothyroid   Past Surgical History:  Procedure Laterality Date   ABDOMINAL HYSTERECTOMY  2003   BREAST BIOPSY Left 2014   benign   BREAST LUMPECTOMY Left 09/07/2018   BREAST LUMPECTOMY WITH RADIOACTIVE SEED AND SENTINEL LYMPH NODE BIOPSY Left 09/07/2018   Procedure: LEFT BREAST LUMPECTOMY WITH RADIOACTIVE SEED AND AXILLARY SENTINEL LYMPH NODE BIOPSY,INJECT BLUE DYE LEFT BREAST;  Surgeon: Gail Favorite, MD;  Location: [facility] OR;  Service: General;  Laterality: Left;   CATARACT EXTRACTION Bilateral 02/2005   CHOLECYSTECTOMY  09/26/2011   Procedure: LAPAROSCOPIC CHOLECYSTECTOMY WITH INTRAOPERATIVE CHOLANGIOGRAM;  Surgeon: Sherlean JINNY Laughter, MD;  Location: [facility] OR;  Service: General;  Laterality: N/A;   COLONOSCOPY     CYSTIC HYGROMA EXCISION     DENTAL SURGERY     skin graft from top of mouth to lower gums   GALLBLADDER SURGERY  11/2016   HYSTEROTOMY  05/2012   PORT-A-CATH REMOVAL Right 11/04/2019   Procedure: REMOVAL PORT-A-CATH;  Surgeon: Belinda Cough, MD;  Location: [facility];  Service: General;  Laterality: Right;   PORTACATH PLACEMENT N/A 09/07/2018   Procedure: INSERTION PORT-A-CATH WITH US ;  Surgeon: Gail Favorite, MD;  Location: [facility] OR;  Service: General;  Laterality: N/A;   REPLACEMENT TOTAL KNEE Right    Family History  Problem Relation Age of Onset   Diabetes Father    Heart disease Father    Lung cancer Father    Hypertension  Father    Hyperlipidemia Father    Thyroid  disease Father    Alcoholism Father    Hypertension Mother    Thyroid  disease Mother    Breast cancer Neg Hx    Ovarian cancer Neg Hx    Social History   Socioeconomic History   Marital status: Divorced    Spouse name: Not on file   Number of children: Not on file   Years of education: Not on file   Highest education level: Not on file  Occupational History   Occupation: retired   Occupation: retired  Tobacco Use   Smoking status: Never   Smokeless tobacco: Never  Vaping Use   Vaping status: Never Used  Substance and Sexual Activity   Alcohol use: Not Currently    Comment: 1-2 a week and reports not every week   Drug use: No   Sexual activity:  Not Currently    Birth control/protection: Surgical  Other Topics Concern   Not on file  Social History Narrative   Not on file   Social Drivers of Health   Financial Resource Strain: Low Risk  (06/28/2024)   Overall Financial Resource Strain (CARDIA)    Difficulty of Paying Living Expenses: Not hard at all  Food Insecurity: No Food Insecurity (06/28/2024)   Hunger Vital Sign    Worried About Running Out of Food in the Last Year: Never true    Ran Out of Food in the Last Year: Never true  Transportation Needs: No Transportation Needs (06/28/2024)   PRAPARE - Administrator, Civil Service (Medical): No    Lack of Transportation (Non-Medical): No  Physical Activity: Inactive (06/28/2024)   Exercise Vital Sign    Days of Exercise per Week: 0 days    Minutes of Exercise per Session: 0 min  Stress: No Stress Concern Present (06/28/2024)   [employer] of Occupational Health - Occupational Stress Questionnaire    Feeling of Stress: Not at all  Social Connections: Socially Isolated (06/28/2024)   Social Connection and Isolation Panel    Frequency of Communication with Friends and Family: More than three times a week    Frequency of Social Gatherings with Friends and Family:  More than three times a week    Attends Religious Services: Never    Database administrator or Organizations: No    Attends Engineer, structural: Never    Marital Status: Divorced    Tobacco Counseling Counseling given: Not Answered    Clinical Intake:  Pre-visit preparation completed: Yes  Pain : No/denies pain     BMI - recorded: 34.75 Nutritional Status: BMI > 30  Obese Nutritional Risks: None Diabetes: No  Lab Results  Component Value Date   HGBA1C 5.8 (H) 01/06/2024   HGBA1C 5.9 07/15/2023   HGBA1C 6.0 04/14/2023     How often do you need to have someone help you when you read instructions, pamphlets, or other written materials from your doctor or pharmacy?: 1 - Never  Interpreter Needed?: No  Information entered by :: Rojelio Blush LPN   Activities of Daily Living     06/28/2024    3:11 PM  In your present state of health, do you have any difficulty performing the following activities:  Hearing? 0  Vision? 0  Difficulty concentrating or making decisions? 0  Walking or climbing stairs? 0  Dressing or bathing? 0  Doing errands, shopping? 0  Preparing Food and eating ? N  Using the Toilet? N  In the past six months, have you accidently leaked urine? N  Do you have problems with loss of bowel control? N  Managing your Medications? N  Managing your Finances? N  Housekeeping or managing your Housekeeping? N    Patient Care Team: Micheal Wolm ORN, MD as PCP - Diedre Dewey Rush, MD as Consulting Physician (Radiation Oncology) Rosalynn ORN Ingle, MD (Inactive) as Consulting Physician (Obstetrics and Gynecology) Rolan Ezra RAMAN, MD as Consulting Physician (Cardiology)  I have updated your Care Teams any recent Medical Services you may have received from other providers in the past year.     Assessment:   This is a routine wellness examination for Tiffany Montes.  Hearing/Vision screen Hearing Screening - Comments:: Denies hearing difficulties    Vision Screening - Comments:: Wears rx glasses - up to date with routine eye exams with  Dr Debarah   Goals  Addressed               This Visit's Progress     Increase physical activity (pt-stated)        Remain active       Depression Screen     06/28/2024    3:08 PM 06/23/2023   10:44 AM 06/23/2023   10:42 AM 10/15/2022    3:19 PM 06/24/2022   10:31 AM 05/13/2022   10:51 AM 04/24/2021   10:00 AM  PHQ 2/9 Scores  PHQ - 2 Score 0 1 0 0 5 2 0  PHQ- 9 Score     13 4     Fall Risk     06/28/2024    3:12 PM 06/23/2023   10:47 AM 10/15/2022    3:19 PM 06/24/2022   10:35 AM 05/13/2022   10:51 AM  Fall Risk   Falls in the past year? 1 1 0 0 0  Number falls in past yr: 0 0 0 0 0  Injury with Fall? 0 0 0 0 0  Risk for fall due to : No Fall Risks No Fall Risks No Fall Risks No Fall Risks Medication side effect  Follow up Falls evaluation completed Falls prevention discussed Falls evaluation completed  Falls evaluation completed  Falls evaluation completed;Education provided;Falls prevention discussed      Data saved with a previous flowsheet row definition    MEDICARE RISK AT HOME:  Medicare Risk at Home Any stairs in or around the home?: No If so, are there any without handrails?: No Home free of loose throw rugs in walkways, pet beds, electrical cords, etc?: Yes Adequate lighting in your home to reduce risk of falls?: Yes Life alert?: No Use of a cane, walker or w/c?: No Grab bars in the bathroom?: Yes Shower chair or bench in shower?: Yes Elevated toilet seat or a handicapped toilet?: Yes  TIMED UP AND GO:  Was the test performed?  No  Cognitive Function: 6CIT completed        06/28/2024    3:14 PM 06/23/2023   10:48 AM 05/13/2022   10:58 AM  6CIT Screen  What Year? 0 points 0 points 0 points  What month? 0 points 0 points 0 points  What time? 0 points 0 points 0 points  Count back from 20 0 points 0 points 0 points  Months in reverse 0 points 0 points 0 points   Repeat phrase 0 points 0 points 8 points  Total Score 0 points 0 points 8 points    Immunizations Immunization History  Administered Date(s) Administered    sv, Bivalent, Protein Subunit Rsvpref,pf (Abrysvo) 08/12/2023   Fluad Quad(high Dose 65+) 08/28/2020, 06/19/2021, 06/24/2022   Fluad Trivalent(High Dose 65+) 07/15/2023   Influenza Split 08/08/2011, 08/24/2012   Influenza, Seasonal, Injecte, Preservative Fre 07/26/2014   Influenza,inj,Quad PF,6+ Mos 06/03/2017, 08/08/2018, 08/02/2019   Influenza-Unspecified 07/07/2016, 08/08/2018   Moderna Covid-19 Fall Seasonal Vaccine 19yrs & older 08/12/2023   PFIZER Comirnaty(Gray Top)Covid-19 Tri-Sucrose Vaccine 03/20/2021   PFIZER(Purple Top)SARS-COV-2 Vaccination 12/05/2019, 12/29/2019, 08/28/2020   Pneumococcal Conjugate-13 09/28/2018   Pneumococcal Polysaccharide-23 04/19/2020   Td 04/14/2009   Tdap 04/19/2019   Zoster Recombinant(Shingrix) 11/02/2022   Zoster, Live 12/04/2015    Screening Tests Health Maintenance  Topic Date Due   Zoster Vaccines- Shingrix (2 of 2) 12/28/2022   Influenza Vaccine  05/07/2024   COVID-19 Vaccine (6 - Pfizer risk 2024-25 season) 06/07/2024   Mammogram  08/03/2024   Medicare Annual Wellness (  AWV)  06/28/2025   DTaP/Tdap/Td (3 - Td or Tdap) 04/18/2029   Colonoscopy  05/12/2029   Pneumococcal Vaccine: 50+ Years  Completed   DEXA SCAN  Completed   Hepatitis C Screening  Completed   HPV VACCINES  Aged Out   Meningococcal B Vaccine  Aged Out    Health Maintenance Items Addressed:   Additional Screening:  Vision Screening: Recommended annual ophthalmology exams for early detection of glaucoma and other disorders of the eye. Is the patient up to date with their annual eye exam?  Yes  Who is the provider or what is the name of the office in which the patient attends annual eye exams? Dr Debarah  Dental Screening: Recommended annual dental exams for proper oral hygiene  Community Resource Referral  / Chronic Care Management: CRR required this visit?  No   CCM required this visit?  No   Plan:    I have personally reviewed and noted the following in the patient's chart:   Medical and social history Use of alcohol, tobacco or illicit drugs  Current medications and supplements including opioid prescriptions. Patient is not currently taking opioid prescriptions. Functional ability and status Nutritional status Physical activity Advanced directives List of other physicians Hospitalizations, surgeries, and ER visits in previous 12 months Vitals Screenings to include cognitive, depression, and falls Referrals and appointments  In addition, I have reviewed and discussed with patient certain preventive protocols, quality metrics, and best practice recommendations. A written personalized care plan for preventive services as well as general preventive health recommendations were provided to patient.   Rojelio LELON Blush, LPN   [id number]   After Visit Summary: (MyChart) Due to this being a telephonic visit, the after visit summary with patients personalized plan was offered to patient via MyChart   Notes: Nothing significant to report at this time.

## 2024-06-28 NOTE — Patient Instructions (Addendum)
 Ms. Leccese,  Thank you for taking the time for your Medicare Wellness Visit. I appreciate your continued commitment to your health goals. Please review the care plan we discussed, and feel free to reach out if I can assist you further.  Medicare recommends these wellness visits once per year to help you and your care team stay ahead of potential health issues. These visits are designed to focus on prevention, allowing your provider to concentrate on managing your acute and chronic conditions during your regular appointments.  Please note that Annual Wellness Visits do not include a physical exam. Some assessments may be limited, especially if the visit was conducted virtually. If needed, we may recommend a separate in-person follow-up with your provider.  Ongoing Care Seeing your primary care provider every 3 to 6 months helps us  monitor your health and provide consistent, personalized care.   Referrals If a referral was made during today's visit and you haven't received any updates within two weeks, please contact the referred provider directly to check on the status.  Recommended Screenings:  Health Maintenance  Topic Date Due   Zoster (Shingles) Vaccine (2 of 2) 12/28/2022   Flu Shot  05/07/2024   COVID-19 Vaccine (6 - Pfizer risk 2024-25 season) 06/07/2024   Breast Cancer Screening  08/03/2024   Medicare Annual Wellness Visit  06/28/2025   DTaP/Tdap/Td vaccine (3 - Td or Tdap) 04/18/2029   Colon Cancer Screening  05/12/2029   Pneumococcal Vaccine for age over 60  Completed   DEXA scan (bone density measurement)  Completed   Hepatitis C Screening  Completed   HPV Vaccine  Aged Out   Meningitis B Vaccine  Aged Out       06/28/2024    3:14 PM  Advanced Directives  Does Patient Have a Medical Advance Directive? Yes  Type of Estate agent of [place];Living will  Copy of Healthcare Power of Attorney in Chart? No - copy requested   Advance Care Planning is  important because it: Ensures you receive medical care that aligns with your values, goals, and preferences. Provides guidance to your family and loved ones, reducing the emotional burden of decision-making during critical moments.  Vision: Annual vision screenings are recommended for early detection of glaucoma, cataracts, and diabetic retinopathy. These exams can also reveal signs of chronic conditions such as diabetes and high blood pressure.  Dental: Annual dental screenings help detect early signs of oral cancer, gum disease, and other conditions linked to overall health, including heart disease and diabetes.  Please see the attached documents for additional preventive care recommendations.

## 2024-07-05 ENCOUNTER — Ambulatory Visit: Admitting: Family Medicine

## 2024-07-12 ENCOUNTER — Ambulatory Visit (INDEPENDENT_AMBULATORY_CARE_PROVIDER_SITE_OTHER): Admitting: Family Medicine

## 2024-07-12 VITALS — BP 120/86

## 2024-07-12 DIAGNOSIS — E039 Hypothyroidism, unspecified: Secondary | ICD-10-CM | POA: Diagnosis not present

## 2024-07-12 DIAGNOSIS — F909 Attention-deficit hyperactivity disorder, unspecified type: Secondary | ICD-10-CM | POA: Diagnosis not present

## 2024-07-12 DIAGNOSIS — I10 Essential (primary) hypertension: Secondary | ICD-10-CM | POA: Diagnosis not present

## 2024-07-12 DIAGNOSIS — E782 Mixed hyperlipidemia: Secondary | ICD-10-CM

## 2024-07-12 DIAGNOSIS — R7303 Prediabetes: Secondary | ICD-10-CM | POA: Diagnosis not present

## 2024-07-12 MED ORDER — ATORVASTATIN CALCIUM 10 MG PO TABS: 10.0000 mg | ORAL_TABLET | Freq: Every day | ORAL | 3 refills | Status: AC

## 2024-07-12 NOTE — Progress Notes (Signed)
 Established Patient Office Visit  Subjective   Patient ID: Tiffany Montes, female    DOB: 01-05-1956  Age: 68 y.o. MRN: [id number]  Chief Complaint  Patient presents with   Annual Exam   Mass    Pt reports a lump on Right lower pelvic. Noticing it for several months. Denied pain. Thinks it is hernia.     HPI   Tiffany Montes is seen for routine medical follow-up.  She had recent Medicare wellness visit.  She was referred last year to [facility] weight management clinic but she fell out of follow-up.  She had had some back difficulties with right lower extremity radiculitis and has seen a back specialist and had a couple of injections.  She has managed to lose about 20 pounds since last year and is trying to eat healthy.  She is somewhat limited in exercise because of her back  She has history of hypertension, hypothyroidism, degenerative arthritis of multiple joints, prediabetes, hyperlipidemia, breast cancer, recurrent depression, ADD.  Her thyroid  was checked last April and normal range.  A1c's have been fairly consistently prediabetic range.  She has hyperlipidemia with most recent total cholesterol 238 with LDL 155.  Previous intolerance with rosuvastatin  with cramps.  She does not recall trying other statins.  She has a brother who has had history of CAD and congestive heart failure.  She will finish up her tamoxifen  therapy this month and is excited to build to come off that.  Past Medical History:  Diagnosis Date   ADHD    Anxiety    Attention deficit disorder    Back pain    Breast cancer (HCC) 2019   Left Breast Cancer   Depression    Gallbladder problem    Gallstones 09/05/2011   Hyperlipidemia    Hypertension    Hypothyroidism    Joint pain    Migraines    Neuropathy    Personal history of chemotherapy 2019   Left Breast Cancer   Personal history of radiation therapy 2019   Left Breast Cancer   PONV (postoperative nausea and vomiting)    diffficulty waking up   Spinal  stenosis    Thyroid  disease    hypothyroid   Past Surgical History:  Procedure Laterality Date   ABDOMINAL HYSTERECTOMY  2003   BREAST BIOPSY Left 2014   benign   BREAST LUMPECTOMY Left 09/07/2018   BREAST LUMPECTOMY WITH RADIOACTIVE SEED AND SENTINEL LYMPH NODE BIOPSY Left 09/07/2018   Procedure: LEFT BREAST LUMPECTOMY WITH RADIOACTIVE SEED AND AXILLARY SENTINEL LYMPH NODE BIOPSY,INJECT BLUE DYE LEFT BREAST;  Surgeon: Gail Favorite, MD;  Location[facility];  Service: General;  Laterality: Left;   CATARACT EXTRACTION Bilateral 02/2005   CHOLECYSTECTOMY  09/26/2011   Procedure: LAPAROSCOPIC CHOLECYSTECTOMY WITH INTRAOPERATIVE CHOLANGIOGRAM;  Surgeon: Sherlean JINNY Laughter, MD;  Location: [facility] OR;  Service: General;  Laterality: N/A;   COLONOSCOPY     CYSTIC HYGROMA EXCISION     DENTAL SURGERY     skin graft from top of mouth to lower gums   GALLBLADDER SURGERY  11/2016   HYSTEROTOMY  05/2012   PORT-A-CATH REMOVAL Right 11/04/2019   Procedure: REMOVAL PORT-A-CATH;  Surgeon: Belinda Cough, MD;  Location: [facility];  Service: General;  Laterality: Right;   PORTACATH PLACEMENT N/A 09/07/2018   Procedure: INSERTION PORT-A-CATH WITH US ;  Surgeon: Gail Favorite, MD;  Location: [facility];  Service: General;  Laterality: N/A;   REPLACEMENT TOTAL KNEE Right     reports that  she has never smoked. She has never used smokeless tobacco. She reports that she does not currently use alcohol. She reports that she does not use drugs. family history includes Alcoholism in her father; Diabetes in her father; Heart disease in her father; Hyperlipidemia in her father; Hypertension in her father and mother; Lung cancer in her father; Thyroid  disease in her father and mother. Allergies  Allergen Reactions   Morphine Sulfate Nausea And Vomiting and Rash    GI upset    Review of Systems  Constitutional:  Negative for malaise/fatigue.  Eyes:  Negative for blurred vision.  Respiratory:  Negative for  shortness of breath.   Cardiovascular:  Negative for chest pain.  Musculoskeletal:  Positive for back pain.  Neurological:  Negative for dizziness, weakness and headaches.      Objective:     There were no vitals taken for this visit. BP Readings from Last 3 Encounters:  01/22/24 120/76  01/06/24 139/74  12/19/23 132/80   Wt Readings from Last 3 Encounters:  06/28/24 190 lb (86.2 kg)  01/22/24 200 lb 6.4 oz (90.9 kg)  01/06/24 203 lb (92.1 kg)      Physical Exam Vitals reviewed.  Constitutional:      General: She is not in acute distress.    Appearance: She is well-developed.  Eyes:     Pupils: Pupils are equal, round, and reactive to light.  Neck:     Thyroid : No thyromegaly.     Vascular: No JVD.  Cardiovascular:     Rate and Rhythm: Normal rate and regular rhythm.     Heart sounds:     No gallop.  Pulmonary:     Effort: Pulmonary effort is normal. No respiratory distress.     Breath sounds: Normal breath sounds. No wheezing or rales.  Musculoskeletal:     Cervical back: Neck supple.     Right lower leg: No edema.     Left lower leg: No edema.  Neurological:     Mental Status: She is alert.      No results found for any visits on 07/12/24.  Last CBC Lab Results  Component Value Date   WBC 5.5 01/06/2024   HGB 14.4 01/06/2024   HCT 43.6 01/06/2024   MCV 87 01/06/2024   MCH 28.6 01/06/2024   RDW 12.8 01/06/2024   PLT 109 (L) 01/06/2024   Last metabolic panel Lab Results  Component Value Date   GLUCOSE 106 (H) 01/06/2024   NA 139 01/06/2024   K 3.9 01/06/2024   CL 101 01/06/2024   CO2 19 (L) 01/06/2024   BUN 26 01/06/2024   CREATININE 0.80 01/06/2024   EGFR 80 01/06/2024   CALCIUM  9.1 01/06/2024   PROT 7.1 01/06/2024   ALBUMIN 4.6 01/06/2024   LABGLOB 2.5 01/06/2024   BILITOT <0.2 01/06/2024   ALKPHOS 55 01/06/2024   AST 23 01/06/2024   ALT 12 01/06/2024   ANIONGAP 7 10/28/2023   Last lipids Lab Results  Component Value Date   CHOL  238 (H) 01/06/2024   HDL 65 01/06/2024   LDLCALC 155 (H) 01/06/2024   LDLDIRECT 136.7 09/15/2012   TRIG 103 01/06/2024   CHOLHDL 4 07/15/2023   Last hemoglobin A1c Lab Results  Component Value Date   HGBA1C 5.8 (H) 01/06/2024   Last thyroid  functions Lab Results  Component Value Date   TSH 4.260 01/06/2024      The 10-year ASCVD risk score (Arnett DK, et al., 2019) is: 10.2%  Values used to calculate the score:     Age: 42 years     Clincally relevant sex: Female     Is Non-Hispanic African American: No     Diabetic: No     Tobacco smoker: No     Systolic Blood Pressure: 120 mmHg     Is BP treated: Yes     HDL Cholesterol: 65 mg/dL     Total Cholesterol: 238 mg/dL    Assessment & Plan:   #1 hyperlipidemia.  10-year calculated risk for CAD over 10%.  Positive family history of CAD in her brother.  She has history of hyperlipidemia with prior intolerance of rosuvastatin .  Recommend trial of low-dose Lipitor 10 mg daily.  If tolerating well recheck lipid and CMP at 28-month follow-up  #2 history of prediabetes range blood sugars.  Continue weight loss and lower glycemic diet.  Recheck A1c at 43-month follow-up  #3 hypothyroidism on levothyroxine  150 mcg daily.  TSH was checked in April and normal range.  Recheck at follow-up  #4 hypertension stable.  Diastolic was up a little bit today but systolic well-controlled.  Continue weight loss efforts and try to keep daily sodium intake less than 2500 mg  #5 ADD.  Patient takes Adderall  and has been on this for several years.  Denies any adverse side effects.  She states without medication she has great difficulties getting through the day getting much done and this helps her focus tremendously Return in about 2 months (around 09/11/2024).    Wolm Scarlet, MD

## 2024-07-12 NOTE — Patient Instructions (Signed)
 Set up 2 month follow up and we will check some labs then  Start the Lipitor 10 mg daily.

## 2024-07-27 ENCOUNTER — Other Ambulatory Visit: Payer: Self-pay | Admitting: Family Medicine

## 2024-07-27 NOTE — Telephone Encounter (Unsigned)
 Copied from CRM [id number]. Topic: Clinical - Medication Refill >> Jul 27, 2024  4:28 PM Tiffany Montes wrote: Medication: amphetamine -dextroamphetamine  (ADDERALL ) 30 MG tablet  Has the patient contacted their pharmacy? Yes (Agent: If no, request that the patient contact the pharmacy for the refill. If patient does not wish to contact the pharmacy document the reason why and proceed with request.) (Agent: If yes, when and what did the pharmacy advise?)  This is the patient's preferred pharmacy:  [id number] - [place], [place] - [address] AT [facility] [address] [place] [address] Phone: [phone] Fax: [phone]  Is this the correct pharmacy for this prescription? Yes If no, delete pharmacy and type the correct one.   Has the prescription been filled recently? Yes  Is the patient out of the medication? Yes  Has the patient been seen for an appointment in the last year OR does the patient have an upcoming appointment? Yes  Can we respond through MyChart? Yes  Agent: Please be advised that Rx refills may take up to 3 business days. We ask that you follow-up with your pharmacy.

## 2024-07-28 MED ORDER — AMPHETAMINE-DEXTROAMPHETAMINE 30 MG PO TABS: 30.0000 mg | ORAL_TABLET | Freq: Every day | ORAL | 0 refills | Status: DC

## 2024-08-31 ENCOUNTER — Other Ambulatory Visit: Payer: Self-pay | Admitting: Family Medicine

## 2024-08-31 DIAGNOSIS — F3289 Other specified depressive episodes: Secondary | ICD-10-CM

## 2024-08-31 NOTE — Telephone Encounter (Signed)
 Copied from CRM [id number]. Topic: Clinical - Medication Refill >> Aug 31, 2024  4:31 PM Nessti S wrote: Medication:  amphetamine -dextroamphetamine  (ADDERALL ) 30 MG tablet ALPRAZolam  (XANAX ) 0.5 MG tablet SUMAtriptan  (IMITREX ) 100 MG tablet betamethasone  dipropionate 0.05 % cream  Has the patient contacted their pharmacy? Yes (Agent: If no, request that the patient contact the pharmacy for the refill. If patient does not wish to contact the pharmacy document the reason why and proceed with request.) (Agent: If yes, when and what did the pharmacy advise?)  This is the patient's preferred pharmacy:  [id number] - [place], [place] - [address] AT [facility] [address] [place] [address] Phone: [phone] Fax: [phone]  Is this the correct pharmacy for this prescription? Yes If no, delete pharmacy and type the correct one.   Has the prescription been filled recently? No  Is the patient out of the medication? Yes  Has the patient been seen for an appointment in the last year OR does the patient have an upcoming appointment? Yes  Can we respond through MyChart? Yes  Agent: Please be advised that Rx refills may take up to 3 business days. We ask that you follow-up with your pharmacy.

## 2024-09-01 MED ORDER — BETAMETHASONE DIPROPIONATE 0.05 % EX CREA: TOPICAL_CREAM | Freq: Two times a day (BID) | CUTANEOUS | 0 refills | Status: AC

## 2024-09-01 MED ORDER — SUMATRIPTAN SUCCINATE 100 MG PO TABS: ORAL_TABLET | ORAL | 0 refills | Status: DC

## 2024-09-04 MED ORDER — AMPHETAMINE-DEXTROAMPHETAMINE 30 MG PO TABS: 30.0000 mg | ORAL_TABLET | Freq: Every day | ORAL | 0 refills | Status: AC

## 2024-09-04 MED ORDER — ALPRAZOLAM 0.5 MG PO TABS: ORAL_TABLET | ORAL | 5 refills | Status: AC

## 2024-09-06 ENCOUNTER — Other Ambulatory Visit: Payer: Self-pay | Admitting: Hematology and Oncology

## 2024-09-06 ENCOUNTER — Ambulatory Visit
Admission: RE | Admit: 2024-09-06 | Discharge: 2024-09-06 | Disposition: A | Source: Ambulatory Visit | Attending: Hematology and Oncology

## 2024-09-06 DIAGNOSIS — Z170 Estrogen receptor positive status [ER+]: Secondary | ICD-10-CM

## 2024-09-06 DIAGNOSIS — Z1231 Encounter for screening mammogram for malignant neoplasm of breast: Secondary | ICD-10-CM

## 2024-09-13 ENCOUNTER — Telehealth: Payer: Self-pay | Admitting: *Deleted

## 2024-09-13 ENCOUNTER — Ambulatory Visit: Admitting: Family Medicine

## 2024-09-13 NOTE — Telephone Encounter (Signed)
 Copied from CRM #[id number]. Topic: Appointments - Appointment Info/Confirmation >> Sep 13, 2024 10:43 AM Cleave MATSU wrote: Pt wants to know if she is having blood work done on the 15th and seeing Dr.Bruce as well. Please follow up with pt regarding

## 2024-09-14 NOTE — Telephone Encounter (Signed)
 Left message for the patient to return my call.

## 2024-09-15 NOTE — Telephone Encounter (Signed)
 Left message for the patient to return my call.

## 2024-09-18 ENCOUNTER — Other Ambulatory Visit: Payer: Self-pay | Admitting: Family Medicine

## 2024-09-18 DIAGNOSIS — I10 Essential (primary) hypertension: Secondary | ICD-10-CM

## 2024-09-20 ENCOUNTER — Encounter: Payer: Self-pay | Admitting: Family Medicine

## 2024-09-20 ENCOUNTER — Ambulatory Visit: Admitting: Family Medicine

## 2024-09-20 ENCOUNTER — Ambulatory Visit: Payer: Self-pay | Admitting: Family Medicine

## 2024-09-20 VITALS — BP 110/70 | HR 74 | Temp 97.5°F | Wt 200.3 lb

## 2024-09-20 DIAGNOSIS — R7303 Prediabetes: Secondary | ICD-10-CM

## 2024-09-20 DIAGNOSIS — E782 Mixed hyperlipidemia: Secondary | ICD-10-CM

## 2024-09-20 DIAGNOSIS — E039 Hypothyroidism, unspecified: Secondary | ICD-10-CM

## 2024-09-20 DIAGNOSIS — I10 Essential (primary) hypertension: Secondary | ICD-10-CM

## 2024-09-20 LAB — HEMOGLOBIN A1C: Hgb A1c MFr Bld: 5.9 % (ref 4.6–6.5)

## 2024-09-20 LAB — COMPREHENSIVE METABOLIC PANEL WITH GFR
ALT: 17 U/L (ref 0–35)
AST: 19 U/L (ref 0–37)
Albumin: 4 g/dL (ref 3.5–5.2)
Alkaline Phosphatase: 60 U/L (ref 39–117)
BUN: 17 mg/dL (ref 6–23)
CO2: 28 meq/L (ref 19–32)
Calcium: 9.2 mg/dL (ref 8.4–10.5)
Chloride: 106 meq/L (ref 96–112)
Creatinine, Ser: 0.79 mg/dL (ref 0.40–1.20)
GFR: 76.22 mL/min (ref >60.00)
Glucose, Bld: 109 mg/dL — ABNORMAL HIGH (ref 70–99)
Potassium: 3.6 meq/L (ref 3.5–5.1)
Sodium: 142 meq/L (ref 135–145)
Total Bilirubin: 0.3 mg/dL (ref 0.2–1.2)
Total Protein: 6.6 g/dL (ref 6.0–8.3)

## 2024-09-20 LAB — LIPID PANEL
Cholesterol: 186 mg/dL (ref 0–200)
HDL: 58.5 mg/dL (ref >39.00)
LDL Cholesterol: 111 mg/dL — ABNORMAL HIGH (ref 0–99)
NonHDL: 127.36
Total CHOL/HDL Ratio: 3
Triglycerides: 84 mg/dL (ref 0.0–149.0)
VLDL: 16.8 mg/dL (ref 0.0–40.0)

## 2024-09-20 LAB — TSH: TSH: 2.8 u[IU]/mL (ref 0.35–5.50)

## 2024-09-20 NOTE — Progress Notes (Signed)
 Established Patient Office Visit  Subjective   Patient ID: Tiffany Montes, female    DOB: [birth year]  Age: 68 y.o. MRN: [id number]  Chief Complaint  Patient presents with   Medical Management of Chronic Issues    HPI    Tiffany Montes is here for routine ongoing medical follow-up.  She has history of hyperlipidemia.  She had frequent muscle cramps with rosuvastatin .  We started atorvastatin  10 mg daily and she seems to have tolerated better.  She had cholesterol 238 with LDL 155.  Family history of premature CAD in her brother and father.  She has no known coronary history.  She also has history of prediabetes and would like to get A1c reassessed at this time.  She has hypothyroidism and is on replacement with levothyroxine  150 mcg daily.  She has hypertension treated with losartan  HCTZ.  Denies any recent dizziness or chest pain.  She is struggling with stress eating and weight tends to go up and down.  She has been followed by health and weight management.  She admits that she has gained a few pounds over the past several weeks.  Past Medical History:  Diagnosis Date   ADHD    Anxiety    Attention deficit disorder    Back pain    Breast cancer (HCC) 2019   Left Breast Cancer   Depression    Gallbladder problem    Gallstones 09/05/2011   Hyperlipidemia    Hypertension    Hypothyroidism    Joint pain    Migraines    Neuropathy    Personal history of chemotherapy 2019   Left Breast Cancer   Personal history of radiation therapy 2019   Left Breast Cancer   PONV (postoperative nausea and vomiting)    diffficulty waking up   Spinal stenosis    Thyroid  disease    hypothyroid   Past Surgical History:  Procedure Laterality Date   ABDOMINAL HYSTERECTOMY  2003   BREAST BIOPSY Left 2014   benign   BREAST LUMPECTOMY Left 09/07/2018   BREAST LUMPECTOMY WITH RADIOACTIVE SEED AND SENTINEL LYMPH NODE BIOPSY Left 09/07/2018   Procedure: LEFT BREAST LUMPECTOMY WITH RADIOACTIVE SEED  AND AXILLARY SENTINEL LYMPH NODE BIOPSY,INJECT BLUE DYE LEFT BREAST;  Surgeon: Gail Favorite, MD;  Location: [facility];  Service: General;  Laterality: Left;   CATARACT EXTRACTION Bilateral 02/2005   CHOLECYSTECTOMY  09/26/2011   Procedure: LAPAROSCOPIC CHOLECYSTECTOMY WITH INTRAOPERATIVE CHOLANGIOGRAM;  Surgeon: Sherlean JINNY Laughter, MD;  Location: [facility];  Service: General;  Laterality: N/A;   COLONOSCOPY     CYSTIC HYGROMA EXCISION     DENTAL SURGERY     skin graft from top of mouth to lower gums   GALLBLADDER SURGERY  11/2016   HYSTEROTOMY  05/2012   PORT-A-CATH REMOVAL Right 11/04/2019   Procedure: REMOVAL PORT-A-CATH;  Surgeon: Belinda Cough, MD;  Location: [facility];  Service: General;  Laterality: Right;   PORTACATH PLACEMENT N/A 09/07/2018   Procedure: INSERTION PORT-A-CATH WITH US ;  Surgeon: Gail Favorite, MD;  Location: [facility] OR;  Service: General;  Laterality: N/A;   REPLACEMENT TOTAL KNEE Right     reports that she has never smoked. She has never used smokeless tobacco. She reports that she does not currently use alcohol. She reports that she does not use drugs. family history includes Alcoholism in her father; Diabetes in her father; Heart disease in her father; Hyperlipidemia in her father; Hypertension in her father and mother; Lung cancer in her  father; Thyroid  disease in her father and mother. Allergies[1]  Review of Systems  Constitutional:  Negative for chills, fever and malaise/fatigue.  Eyes:  Negative for blurred vision.  Respiratory:  Negative for shortness of breath.   Cardiovascular:  Negative for chest pain.  Neurological:  Negative for dizziness, weakness and headaches.      Objective:     BP 110/70   Pulse 74   Temp (!) 97.5 F (36.4 C) (Oral)   Wt 200 lb 4.8 oz (90.9 kg)   SpO2 96%   BMI 36.64 kg/m  BP Readings from Last 3 Encounters:  09/20/24 110/70  07/12/24 120/86  01/22/24 120/76   Wt Readings from Last 3 Encounters:   09/20/24 200 lb 4.8 oz (90.9 kg)  06/28/24 190 lb (86.2 kg)  01/22/24 200 lb 6.4 oz (90.9 kg)      Physical Exam Vitals reviewed.  Constitutional:      General: She is not in acute distress.    Appearance: Normal appearance. She is well-developed. She is not ill-appearing.  Eyes:     Pupils: Pupils are equal, round, and reactive to light.  Neck:     Thyroid : No thyromegaly.     Vascular: No JVD.  Cardiovascular:     Rate and Rhythm: Normal rate and regular rhythm.     Heart sounds:     No gallop.  Pulmonary:     Effort: Pulmonary effort is normal. No respiratory distress.     Breath sounds: Normal breath sounds. No wheezing or rales.  Musculoskeletal:     Cervical back: Neck supple.     Right lower leg: No edema.     Left lower leg: No edema.  Neurological:     Mental Status: She is alert.      No results found for any visits on 09/20/24.    The 10-year ASCVD risk score (Arnett DK, et al., 2019) is: 8.7%    Assessment & Plan:   Problem List Items Addressed This Visit       Unprioritized   Hypothyroidism   Relevant Orders   TSH   Mixed hyperlipidemia - Primary   Relevant Orders   Lipid panel   CMP   Prediabetes   Relevant Orders   Hemoglobin A1c   Essential hypertension   Tiffany Montes is here to follow-up multiple issues as above.  She had previous intolerance with rosuvastatin  but has tolerated atorvastatin .  Will recheck fasting lipids today along with CMP  -Recheck A1c.  Reemphasized importance of lower glycemic diet and regular exercise  -Recheck TSH.  She is on replacement regularly as above.    - Continue losartan  HCTZ for hypertension.  This is currently well-controlled  Return in about 6 months (around 03/21/2025).    Wolm Scarlet, MD     [1]  Allergies Allergen Reactions   Morphine Sulfate Nausea And Vomiting and Rash    GI upset

## 2024-09-26 ENCOUNTER — Other Ambulatory Visit: Payer: Self-pay | Admitting: Family Medicine

## 2024-10-18 ENCOUNTER — Other Ambulatory Visit: Payer: Self-pay | Admitting: Family Medicine

## 2024-10-18 DIAGNOSIS — F3289 Other specified depressive episodes: Secondary | ICD-10-CM

## 2024-10-18 NOTE — Telephone Encounter (Signed)
 Copied from CRM #[id number]. Topic: Clinical - Medication Refill >> Oct 18, 2024  2:28 PM Montie POUR wrote: Medication:  ALPRAZolam  (XANAX ) 0.5 MG tablet amphetamine -dextroamphetamine  (ADDERALL) 30 MG tablet buPROPion  (WELLBUTRIN  XL) 300 MG 24 hr tablet  Has the patient contacted their pharmacy? Yes (Agent: If no, request that the patient contact the pharmacy for the refill. If patient does not wish to contact the pharmacy document the reason why and proceed with request.) (Agent: If yes, when and what did the pharmacy advise?) Pharmacy needs order to refill  This is the patient's preferred pharmacy:  [id number] - [place], [place] - [address] AT [facility] [address] [place] [address] Phone: [phone] Fax: [phone]  Is this the correct pharmacy for this prescription? Yes If no, delete pharmacy and type the correct one.   Has the prescription been filled recently? No  Is the patient out of the medication? No  Has the patient been seen for an appointment in the last year OR does the patient have an upcoming appointment? Yes  Can we respond through MyChart? Yes  Agent: Please be advised that Rx refills may take up to 3 business days. We ask that you follow-up with your pharmacy.

## 2024-10-21 ENCOUNTER — Other Ambulatory Visit: Payer: Self-pay | Admitting: Family Medicine

## 2024-10-26 ENCOUNTER — Telehealth: Payer: Self-pay | Admitting: Hematology and Oncology

## 2024-10-26 NOTE — Telephone Encounter (Signed)
 Rescheduled appointments per incoming call from the patient. Talked with the patient and she is aware of the changes made to her upcoming appointment.

## 2024-10-28 ENCOUNTER — Inpatient Hospital Stay: Payer: Medicare Other | Admitting: Hematology and Oncology

## 2024-10-30 ENCOUNTER — Other Ambulatory Visit: Payer: Self-pay | Admitting: Family Medicine

## 2024-11-01 ENCOUNTER — Other Ambulatory Visit: Payer: Self-pay | Admitting: Family Medicine

## 2024-11-02 ENCOUNTER — Inpatient Hospital Stay: Admitting: Hematology and Oncology

## 2024-11-02 ENCOUNTER — Telehealth: Payer: Self-pay

## 2024-11-02 NOTE — Telephone Encounter (Signed)
 Called the patient due to not showing up for her 1245 app with Dr. Loretha, no answer to call LVM to call us  back to reschedule the appointment.

## 2025-07-04 ENCOUNTER — Ambulatory Visit
